# Patient Record
Sex: Male | Born: 1955 | Race: Black or African American | Hispanic: No | Marital: Single
Health system: Southern US, Community
[De-identification: ages and names within clinical notes are randomized; demographics above are authoritative.]

## PROBLEM LIST (undated history)

## (undated) DIAGNOSIS — F209 Schizophrenia, unspecified: Secondary | ICD-10-CM

## (undated) DIAGNOSIS — R519 Headache, unspecified: Secondary | ICD-10-CM

## (undated) DIAGNOSIS — F99 Mental disorder, not otherwise specified: Secondary | ICD-10-CM

## (undated) DIAGNOSIS — R51 Headache: Secondary | ICD-10-CM

## (undated) DIAGNOSIS — M545 Low back pain, unspecified: Secondary | ICD-10-CM

## (undated) DIAGNOSIS — N189 Chronic kidney disease, unspecified: Secondary | ICD-10-CM

## (undated) DIAGNOSIS — I63542 Cerebral infarction due to unspecified occlusion or stenosis of left cerebellar artery: Secondary | ICD-10-CM

## (undated) DIAGNOSIS — I10 Essential (primary) hypertension: Secondary | ICD-10-CM

## (undated) HISTORY — DX: Cerebral infarction due to unspecified occlusion or stenosis of left cerebellar artery: I63.542

---

## 1998-09-14 ENCOUNTER — Emergency Department (HOSPITAL_COMMUNITY): Admission: EM | Admit: 1998-09-14 | Discharge: 1998-09-14 | Payer: Self-pay | Admitting: *Deleted

## 2000-08-01 ENCOUNTER — Emergency Department (HOSPITAL_COMMUNITY): Admission: EM | Admit: 2000-08-01 | Discharge: 2000-08-01 | Payer: Self-pay

## 2001-11-14 ENCOUNTER — Emergency Department (HOSPITAL_COMMUNITY): Admission: EM | Admit: 2001-11-14 | Discharge: 2001-11-14 | Payer: Self-pay | Admitting: Emergency Medicine

## 2001-11-16 ENCOUNTER — Emergency Department (HOSPITAL_COMMUNITY): Admission: EM | Admit: 2001-11-16 | Discharge: 2001-11-16 | Payer: Self-pay | Admitting: Emergency Medicine

## 2002-09-16 ENCOUNTER — Emergency Department (HOSPITAL_COMMUNITY): Admission: EM | Admit: 2002-09-16 | Discharge: 2002-09-16 | Payer: Self-pay | Admitting: Emergency Medicine

## 2002-09-16 ENCOUNTER — Encounter: Payer: Self-pay | Admitting: Emergency Medicine

## 2003-08-26 ENCOUNTER — Emergency Department (HOSPITAL_COMMUNITY): Admission: AD | Admit: 2003-08-26 | Discharge: 2003-08-26 | Payer: Self-pay | Admitting: Family Medicine

## 2003-10-20 ENCOUNTER — Emergency Department (HOSPITAL_COMMUNITY): Admission: EM | Admit: 2003-10-20 | Discharge: 2003-10-20 | Payer: Self-pay | Admitting: Emergency Medicine

## 2003-11-28 ENCOUNTER — Emergency Department (HOSPITAL_COMMUNITY): Admission: EM | Admit: 2003-11-28 | Discharge: 2003-11-28 | Payer: Self-pay | Admitting: Emergency Medicine

## 2006-08-23 ENCOUNTER — Emergency Department (HOSPITAL_COMMUNITY): Admission: EM | Admit: 2006-08-23 | Discharge: 2006-08-23 | Payer: Self-pay | Admitting: Emergency Medicine

## 2006-11-21 ENCOUNTER — Emergency Department (HOSPITAL_COMMUNITY): Admission: EM | Admit: 2006-11-21 | Discharge: 2006-11-21 | Payer: Self-pay | Admitting: Emergency Medicine

## 2007-01-16 ENCOUNTER — Emergency Department (HOSPITAL_COMMUNITY): Admission: EM | Admit: 2007-01-16 | Discharge: 2007-01-16 | Payer: Self-pay | Admitting: Emergency Medicine

## 2007-07-06 ENCOUNTER — Emergency Department (HOSPITAL_COMMUNITY): Admission: EM | Admit: 2007-07-06 | Discharge: 2007-07-06 | Payer: Self-pay | Admitting: Emergency Medicine

## 2007-07-12 ENCOUNTER — Emergency Department (HOSPITAL_COMMUNITY): Admission: EM | Admit: 2007-07-12 | Discharge: 2007-07-12 | Payer: Self-pay | Admitting: Emergency Medicine

## 2007-11-24 ENCOUNTER — Emergency Department (HOSPITAL_COMMUNITY): Admission: EM | Admit: 2007-11-24 | Discharge: 2007-11-24 | Payer: Self-pay | Admitting: Family Medicine

## 2007-12-08 ENCOUNTER — Inpatient Hospital Stay (HOSPITAL_COMMUNITY): Admission: EM | Admit: 2007-12-08 | Discharge: 2007-12-12 | Payer: Self-pay | Admitting: Emergency Medicine

## 2007-12-18 ENCOUNTER — Ambulatory Visit: Payer: Self-pay | Admitting: Gastroenterology

## 2008-02-25 ENCOUNTER — Emergency Department (HOSPITAL_COMMUNITY): Admission: EM | Admit: 2008-02-25 | Discharge: 2008-02-25 | Payer: Self-pay | Admitting: Emergency Medicine

## 2008-04-28 ENCOUNTER — Emergency Department (HOSPITAL_COMMUNITY): Admission: EM | Admit: 2008-04-28 | Discharge: 2008-04-28 | Payer: Self-pay | Admitting: Emergency Medicine

## 2008-04-30 ENCOUNTER — Emergency Department (HOSPITAL_COMMUNITY): Admission: EM | Admit: 2008-04-30 | Discharge: 2008-04-30 | Payer: Self-pay | Admitting: Family Medicine

## 2008-05-02 ENCOUNTER — Emergency Department (HOSPITAL_COMMUNITY): Admission: EM | Admit: 2008-05-02 | Discharge: 2008-05-02 | Payer: Self-pay | Admitting: Emergency Medicine

## 2008-06-28 ENCOUNTER — Ambulatory Visit: Payer: Self-pay | Admitting: Family Medicine

## 2008-06-28 LAB — CONVERTED CEMR LAB
Albumin: 4.7 g/dL (ref 3.5–5.2)
BUN: 15 mg/dL (ref 6–23)
Basophils Absolute: 0 10*3/uL (ref 0.0–0.1)
CO2: 23 meq/L (ref 19–32)
Cholesterol: 174 mg/dL (ref 0–200)
Creatinine, Ser: 1.63 mg/dL — ABNORMAL HIGH (ref 0.40–1.50)
Glucose, Bld: 79 mg/dL (ref 70–99)
HDL: 63 mg/dL (ref >39)
Hemoglobin: 14.3 g/dL (ref 13.0–17.0)
LDL Cholesterol: 100 mg/dL — ABNORMAL HIGH (ref 0–99)
Lymphs Abs: 2.5 10*3/uL (ref 0.7–4.0)
MCHC: 33.5 g/dL (ref 30.0–36.0)
MCV: 83.7 fL (ref 78.0–100.0)
Monocytes Relative: 6 % (ref 3–12)
Neutro Abs: 3.7 10*3/uL (ref 1.7–7.7)
Platelets: 238 10*3/uL (ref 150–400)
Potassium: 3.5 meq/L (ref 3.5–5.3)
RBC: 5.1 M/uL (ref 4.22–5.81)
RDW: 14.8 % (ref 11.5–15.5)
TSH: 1.292 microintl units/mL (ref 0.350–4.50)
Testosterone: 695.53 ng/dL (ref 350–890)
Total Bilirubin: 0.7 mg/dL (ref 0.3–1.2)
Total Protein: 7.6 g/dL (ref 6.0–8.3)
VLDL: 11 mg/dL (ref 0–40)
WBC: 6.6 10*3/uL (ref 4.0–10.5)

## 2008-09-12 ENCOUNTER — Ambulatory Visit: Payer: Self-pay | Admitting: Family Medicine

## 2009-02-28 ENCOUNTER — Emergency Department (HOSPITAL_COMMUNITY): Admission: EM | Admit: 2009-02-28 | Discharge: 2009-03-02 | Payer: Self-pay | Admitting: Emergency Medicine

## 2010-02-21 ENCOUNTER — Emergency Department (HOSPITAL_COMMUNITY): Admission: EM | Admit: 2010-02-21 | Discharge: 2010-02-21 | Payer: Self-pay | Admitting: Emergency Medicine

## 2010-05-16 ENCOUNTER — Inpatient Hospital Stay (HOSPITAL_COMMUNITY): Admission: EM | Admit: 2010-05-16 | Discharge: 2010-05-17 | Payer: Self-pay | Admitting: Emergency Medicine

## 2010-05-17 ENCOUNTER — Ambulatory Visit: Payer: Self-pay | Admitting: Psychiatry

## 2010-05-17 ENCOUNTER — Inpatient Hospital Stay (HOSPITAL_COMMUNITY): Admission: RE | Admit: 2010-05-17 | Discharge: 2010-05-20 | Payer: Self-pay | Admitting: Psychiatry

## 2010-06-25 ENCOUNTER — Ambulatory Visit: Payer: Self-pay | Admitting: Family Medicine

## 2010-12-06 ENCOUNTER — Encounter: Payer: Self-pay | Admitting: Psychiatry

## 2011-01-27 ENCOUNTER — Emergency Department (HOSPITAL_COMMUNITY)
Admission: EM | Admit: 2011-01-27 | Discharge: 2011-01-28 | Disposition: A | Discharge status: Home / Self Care | Payer: Self-pay | Attending: Emergency Medicine | Admitting: Emergency Medicine

## 2011-01-27 DIAGNOSIS — K2970 Gastritis, unspecified, without bleeding: Secondary | ICD-10-CM | POA: Insufficient documentation

## 2011-01-27 DIAGNOSIS — I10 Essential (primary) hypertension: Secondary | ICD-10-CM | POA: Insufficient documentation

## 2011-01-27 DIAGNOSIS — R630 Anorexia: Secondary | ICD-10-CM | POA: Insufficient documentation

## 2011-01-27 DIAGNOSIS — R209 Unspecified disturbances of skin sensation: Secondary | ICD-10-CM | POA: Insufficient documentation

## 2011-01-27 DIAGNOSIS — R110 Nausea: Secondary | ICD-10-CM | POA: Insufficient documentation

## 2011-01-27 DIAGNOSIS — F3289 Other specified depressive episodes: Secondary | ICD-10-CM | POA: Insufficient documentation

## 2011-01-27 DIAGNOSIS — M542 Cervicalgia: Secondary | ICD-10-CM | POA: Insufficient documentation

## 2011-01-27 DIAGNOSIS — R1013 Epigastric pain: Secondary | ICD-10-CM | POA: Insufficient documentation

## 2011-01-27 DIAGNOSIS — F329 Major depressive disorder, single episode, unspecified: Secondary | ICD-10-CM | POA: Insufficient documentation

## 2011-01-28 ENCOUNTER — Emergency Department (HOSPITAL_COMMUNITY): Payer: Self-pay

## 2011-01-28 LAB — CBC
HCT: 36.8 % — ABNORMAL LOW (ref 39.0–52.0)
Hemoglobin: 12.3 g/dL — ABNORMAL LOW (ref 13.0–17.0)
RBC: 4.51 MIL/uL (ref 4.22–5.81)
WBC: 6 10*3/uL (ref 4.0–10.5)

## 2011-01-28 LAB — COMPREHENSIVE METABOLIC PANEL
Albumin: 3.9 g/dL (ref 3.5–5.2)
CO2: 27 mEq/L (ref 19–32)
Chloride: 104 mEq/L (ref 96–112)
GFR calc Af Amer: 51 mL/min — ABNORMAL LOW (ref >60)
Glucose, Bld: 101 mg/dL — ABNORMAL HIGH (ref 70–99)
Potassium: 3.8 mEq/L (ref 3.5–5.1)
Sodium: 136 mEq/L (ref 135–145)
Total Bilirubin: 0.7 mg/dL (ref 0.3–1.2)
Total Protein: 7.3 g/dL (ref 6.0–8.3)

## 2011-01-28 LAB — LIPASE, BLOOD: Lipase: 26 U/L (ref 11–59)

## 2011-01-28 LAB — DIFFERENTIAL
Basophils Relative: 1 % (ref 0–1)
Eosinophils Absolute: 0.1 10*3/uL (ref 0.0–0.7)
Lymphs Abs: 2.3 10*3/uL (ref 0.7–4.0)
Monocytes Absolute: 0.5 10*3/uL (ref 0.1–1.0)
Monocytes Relative: 9 % (ref 3–12)

## 2011-01-31 LAB — BASIC METABOLIC PANEL
BUN: 13 mg/dL (ref 6–23)
BUN: 15 mg/dL (ref 6–23)
BUN: 19 mg/dL (ref 6–23)
CO2: 26 mEq/L (ref 19–32)
Calcium: 9.3 mg/dL (ref 8.4–10.5)
Calcium: 9.8 mg/dL (ref 8.4–10.5)
Chloride: 108 mEq/L (ref 96–112)
Creatinine, Ser: 1.79 mg/dL — ABNORMAL HIGH (ref 0.4–1.5)
Creatinine, Ser: 1.76 mg/dL — ABNORMAL HIGH (ref 0.4–1.5)
Creatinine, Ser: 2.07 mg/dL — ABNORMAL HIGH (ref 0.4–1.5)
GFR calc Af Amer: 41 mL/min — ABNORMAL LOW (ref >60)
GFR calc Af Amer: 49 mL/min — ABNORMAL LOW (ref >60)
GFR calc non Af Amer: 34 mL/min — ABNORMAL LOW (ref >60)

## 2011-01-31 LAB — COMPREHENSIVE METABOLIC PANEL
ALT: 10 U/L (ref 0–53)
ALT: 11 U/L (ref 0–53)
Alkaline Phosphatase: 37 U/L — ABNORMAL LOW (ref 39–117)
Alkaline Phosphatase: 42 U/L (ref 39–117)
BUN: 15 mg/dL (ref 6–23)
CO2: 25 mEq/L (ref 19–32)
CO2: 26 mEq/L (ref 19–32)
Calcium: 9.1 mg/dL (ref 8.4–10.5)
GFR calc non Af Amer: 35 mL/min — ABNORMAL LOW (ref >60)
GFR calc non Af Amer: 41 mL/min — ABNORMAL LOW (ref >60)
Glucose, Bld: 114 mg/dL — ABNORMAL HIGH (ref 70–99)
Glucose, Bld: 142 mg/dL — ABNORMAL HIGH (ref 70–99)
Potassium: 3.5 mEq/L (ref 3.5–5.1)
Sodium: 135 mEq/L (ref 135–145)
Total Bilirubin: 0.8 mg/dL (ref 0.3–1.2)
Total Protein: 6.1 g/dL (ref 6.0–8.3)

## 2011-01-31 LAB — URINALYSIS, ROUTINE W REFLEX MICROSCOPIC
Ketones, ur: NEGATIVE mg/dL
Protein, ur: NEGATIVE mg/dL
Urobilinogen, UA: 0.2 mg/dL (ref 0.0–1.0)

## 2011-01-31 LAB — CBC
HCT: 34.1 % — ABNORMAL LOW (ref 39.0–52.0)
HCT: 35.9 % — ABNORMAL LOW (ref 39.0–52.0)
HCT: 39.3 % (ref 39.0–52.0)
Hemoglobin: 11.4 g/dL — ABNORMAL LOW (ref 13.0–17.0)
Hemoglobin: 12.2 g/dL — ABNORMAL LOW (ref 13.0–17.0)
MCH: 28.8 pg (ref 26.0–34.0)
MCHC: 33.9 g/dL (ref 30.0–36.0)
MCV: 86.6 fL (ref 78.0–100.0)
MCV: 87.6 fL (ref 78.0–100.0)
Platelets: 253 10*3/uL (ref 150–400)
RBC: 4.48 MIL/uL (ref 4.22–5.81)
RDW: 14.5 % (ref 11.5–15.5)
RDW: 14.7 % (ref 11.5–15.5)
WBC: 6.9 10*3/uL (ref 4.0–10.5)

## 2011-01-31 LAB — DIFFERENTIAL
Basophils Absolute: 0 10*3/uL (ref 0.0–0.1)
Basophils Relative: 0 % (ref 0–1)
Eosinophils Absolute: 0 10*3/uL (ref 0.0–0.7)
Neutrophils Relative %: 65 % (ref 43–77)

## 2011-01-31 LAB — BLOOD GAS, ARTERIAL
Acid-base deficit: 0.9 mmol/L (ref 0.0–2.0)
Drawn by: 324021
O2 Content: 2 L/min
O2 Saturation: 97.3 %

## 2011-01-31 LAB — TROPONIN I: Troponin I: 0.02 ng/mL (ref 0.00–0.06)

## 2011-01-31 LAB — ETHANOL: Alcohol, Ethyl (B): 6 mg/dL (ref 0–10)

## 2011-01-31 LAB — RAPID URINE DRUG SCREEN, HOSP PERFORMED
Barbiturates: NOT DETECTED
Benzodiazepines: NOT DETECTED
Cocaine: POSITIVE — AB
Opiates: NOT DETECTED

## 2011-01-31 LAB — SALICYLATE LEVEL: Salicylate Lvl: <4 mg/dL (ref 2.8–20.0)

## 2011-02-03 LAB — DIFFERENTIAL
Eosinophils Relative: 1 % (ref 0–5)
Lymphocytes Relative: 22 % (ref 12–46)
Monocytes Absolute: 0.8 10*3/uL (ref 0.1–1.0)
Monocytes Relative: 7 % (ref 3–12)
Neutro Abs: 7.9 10*3/uL — ABNORMAL HIGH (ref 1.7–7.7)

## 2011-02-03 LAB — BASIC METABOLIC PANEL
CO2: 28 mEq/L (ref 19–32)
Calcium: 9.1 mg/dL (ref 8.4–10.5)
Creatinine, Ser: 1.92 mg/dL — ABNORMAL HIGH (ref 0.4–1.5)
GFR calc Af Amer: 45 mL/min — ABNORMAL LOW (ref >60)
GFR calc non Af Amer: 37 mL/min — ABNORMAL LOW (ref >60)
Glucose, Bld: 100 mg/dL — ABNORMAL HIGH (ref 70–99)
Sodium: 136 mEq/L (ref 135–145)

## 2011-02-03 LAB — CBC
Hemoglobin: 13.1 g/dL (ref 13.0–17.0)
MCHC: 32.7 g/dL (ref 30.0–36.0)
RBC: 4.59 MIL/uL (ref 4.22–5.81)
RDW: 14.6 % (ref 11.5–15.5)

## 2011-03-30 NOTE — H&P (Signed)
NAME:  Darryl Parsons, Darryl Parsons              ACCOUNT NO.:  [id number]   MEDICAL RECORD NO.:  [id number]          PATIENT TYPE:  INP   LOCATION:  1823                         FACILITY:  [facility]   PHYSICIAN:  Mobolaji B. Bakare, M.D.DATE OF BIRTH:  June 23, 1956   DATE OF ADMISSION:  12/08/2007  DATE OF DISCHARGE:                              HISTORY & PHYSICAL   PRIORITY ADMISSION HISTORY AND PHYSICAL    </PRIMARY CARE PHYSICIAN/  UNASSIGNED   ADMISSION DIAGNOSES:  1. Hematochezia with fairly stable hemoglobin and hematocrit since      March 2008.  The patient will be admitted to a telemetry floor,      intravenous fluid normal saline 100 ml x1 liter and then 60 ml per      hour.  We will discontinue Indocin.  We will check hemoglobin and      hematocrit q.8h, type and screen two units of packed red blood      cells, will place on a clear liquid diet and obtain a      Gastroenterology consult in the a.m.  He will need further      evaluation with colonoscopy.  There was no para-anal discharging      sinus noted.  2. Renal insufficiency.  This may be related to the Vasotec.  We will      discontinue the Vasotec and monitor BMET.  We will check a      fractional excretion of sodium.  3. Hypertension.  Blood pressure is fairly normal.  We will hold all      Vasotec for now and use Hydralazine p.r.n. for systolic blood      pressure greater than 180.  Will consider starting beta blocker      when stability of his blood pressure is assured.  4. Tobacco abuse.  We will offer tobacco cessation counseling.  We      will place a nicotine patch 21 mg daily.   PRIMARY CARE PHYSICIAN:  Unassigned.   CHIEF COMPLAINT:  Rectal bleeding.   HISTORY OF PRESENTING COMPLAINT:  Darryl Parsons is a 55 year old, African-  American male presenting with recurrent rectal bleeding since 2007.  He  stated that he is getting worse in the last couple of weeks such that  his underwear gets soaked through to his jeans (this  was actually noted  on patient's clothing in the emergency room).  There is no associated  abdominal pain.  His stool is aware normal color and not hard.  He does  not have constipation.  He sometimes notices blood after moving his  bowels though there is no pain on defecation.   The patient states he has lost about five pounds of weight in the past  couple of weeks unintentional and he has been eating well.  He has also  been feeling somewhat dizzy but has not passed out.   Initial evaluation in the emergency room revealed normal coags,  hemoglobin and hematocrit were 12.5 and 37.3 respectively.  Liver tests  have been fairly stable since March 2008 when hemoglobin was 12.7 and  hematocrit of 38.  The patient denies alcohol use.  He has been using  Endocet for sciatica; however, there is not a complaint of abdominal  pain.   REVIEW OF SYSTEMS:  He denies fever, chills, shortness of breath, cough,  headaches.  He has been having intermittent cramps in his left lower  extremity.  No dysuria or urgency.   PAST MEDICAL HISTORY:  Hypertension.  This was diagnosed while he was  spending time in jail.  Since he left jail in 2006, he has been using  his medication erratically.   CURRENT MEDICATIONS:  Indocin, Vasotec, Viagra.   ALLERGIES:  No known drug allergies.   FAMILY HISTORY:  No family history of colon cancer.  Mother is alive.  She has hypertension and arthritis.  He has no knowledge of his father's  health status.   SOCIAL HISTORY:  The patient is unemployed.  He is a Immunologist.  He smokes cigarettes one pack per day.  He states  that he seldom drinks alcohol, maybe a pint of vodka liquor twice a  year.  He has used crack cocaine in the past.   INITIAL VITAL SIGNS:  Temperature 98.4, blood pressure 140/95, pulse of  62, respiratory rate 18, O2 SAT 97% on room air.   PHYSICAL EXAMINATION:  GENERAL:  The patient is awake, alert, oriented  in time,  place, and person.  HEENT:  Normocephalic, atraumatic head, pupils equal, round, and  reactive to light.  NECK:  No elevated JVD, no carotid bruit.  LUNGS:  Clear clinically to auscultation.  CVS:  S1 S2 regular.  ABDOMEN:  Not distended, soft and nontender, bowel sounds present.  RECTAL EXAMINATION:  Visual palpable organomegaly.  He appears to have a  patulous  anal verge.  There is no perianal cyanosis noted.  There is no  fine bleeding noted through the anus.  He had a digital rectal done by  ER physician, which was positive for stool hemoccult.  EXTREMITIES:  No pedal edema or calf tenderness.  Dorsalis pedis pulses  2+ bilaterally.  CNS:  No focal neurological deficit.   INITIAL LABORATORY DATA:  White cell 8.4, hemoglobin 12.5, hematocrit  37.3, platelets 215, normal differential.  Stool hemoccult positive.  PT-  INR 15.6/1.0, PTT 33.  Sodium 138, potassium 4.7, chloride 108, bicarb  23, glucose 81, BUN 18, creatinine 1.87.  Bilirubin 1.3, alkaline-  phosphatase 45, AST 27, ALT 14, total protein 6.7, albumin 3.8, calcium  9.3.  Urinalysis showed a specific gravity of 1.026, negative for  nitrites and leukocyte-esterase.   ASSESSMENT AND PLAN:  Darryl Parsons is a 55 year old, African-American male  presenting with rectal bleeding, which soaks through his underwear and  clothing and I noticed this on his underwear and jeans in the emergency  room.  It does not appear entirely frank blood.  He will be admitted for  further evaluation.      Mobolaji B. Maia Petties, M.D.  Electronically Signed     MBB/MEDQ  D:  12/08/2007  T:  12/08/2007  Job:  [id number]

## 2011-04-02 NOTE — Discharge Summary (Signed)
NAMEMAYCOL, SHOTTS              ACCOUNT NO.:  [id number]   MEDICAL RECORD NO.:  [id number]          PATIENT TYPE:  INP   LOCATION:  6706                         FACILITY:  [facility]   PHYSICIAN:  Mobolaji B. Bakare, M.D.DATE OF BIRTH:  Jul 10, 1956   DATE OF ADMISSION:  12/08/2007  DATE OF DISCHARGE:  12/12/2007                               DISCHARGE SUMMARY   FINAL DIAGNOSES:  1. Hematochezia secondary to internal hemorrhoids.  2. Hypertension.  3. Tobacco abuse.  4. Asymptomatic sinus bradycardia.  5. Renal insufficiency likely chronic.  6. Cocaine abuse.  76. Homeless, lives in a shelter.   PROCEDURES:  Colonoscopy done on December 11, 2007, by Dr. Fuller Plan.  Findings, large internal hemorrhoids.   HOSPITAL CONSULTATIONS:  Gastroenterology consult provided by Dr.  Norberto Sorenson T. Fuller Plan.   BRIEF HISTORY:  Please refer to the admission H and P dictated by Dr.  Maia Petties.  Mr. Mercedes is a 55 year old African American male who is  homeless and lives in a shelter.  He presented with hematochezia which  was painless, acute renal insufficiency.  He has history of  hypertension, tobacco abuse and cocaine abuse.  He has had several  emergency room visits for rectal bleed in the past prior to this  hospitalization.  His coags were normal.  Hemoglobin and hematocrit have  been relatively stable since March 2008.  The patient was admitted for  further evaluation.   HOSPITAL COURSE:  Painless hematochezia.  The patient's hemoglobin and  hematocrit were monitored during the course of hospitalization.  These  remained fairly stable.  GI consult was obtained.  He was prepped for a  colonoscopy.  Colonoscopy revealed large internal hemorrhoids.  The  patient was prescribed Anusol suppository p.r.n. and advised on  conservative management including avoiding constipation.  During the  course of hospitalization he was noted on telemetry to have sinus  bradycardia.  This was asymptomatic.  Heart rate was in  the mid to high  50s.   Hypertension.  The patient's blood pressure was fairly controlled during  the course of hospitalization.  Adjustment was made to his medication.  He was discharged home on hydralazine.   Upon admission he was noted to have renal insufficiency with normal BUN  of 18 and creatinine of 1.87.  The patient had apparently been on  Vasotec prior to hospitalization.  It is unclear if he was actually  taking his medications regularly.  He could not tell who his primary  care doctor was.  The Vasotec was discontinued.  ACE inhibitor and  diuretics were totally avoided because the patient's compliance with  followup is unreliable.  He was started on hydralazine.  Blood pressure  was improving at the time of discharge, blood pressure was 121/97.   Renal  insufficiency.  This appears to be chronic.  The patient has been  on Indocin and Vasotec prior to hospitalization and his urine drug  screen was also positive for cocaine.  He should be mentioned that he  had uncontrolled hypertension at the time of admission.  At the time of  discharge BUN was 14 and  creatinine of 1.48.  Urine sodium was 88.  Unfortunately, a urine creatinine was not performed.   DISCHARGE MEDICATIONS:  1. Colace 100 mg daily at bedtime.  2. Anusol suppository one p.r. p.r.n.  3. Nicotine patch 21 mg daily.  4. Nu-Iron 150 mg daily.  5. Hydralazine 10 mg 4x a day.   DISCHARGE LABORATORY DATA:  Fasting lipid profile - total cholesterol  168, HDL 51, LDL 93, ferritin 58, folic acid 8.8, vitamin B12 428, iron  87, TIBC 270, percent of saturation 32.   DISPOSITION:  The patient was discharged back to the shelter.  He was  offered to follow up at [facility] but he declined.  He stated that he  would go to [facility].  Followup BMET in 1 week.      Mobolaji B. Maia Petties, M.D.  Electronically Signed     MBB/MEDQ  D:  02/03/2008  T:  02/03/2008  Job:  [id number]

## 2011-04-03 ENCOUNTER — Emergency Department (HOSPITAL_COMMUNITY)
Admission: EM | Admit: 2011-04-03 | Discharge: 2011-04-03 | Disposition: A | Discharge status: Home / Self Care | Payer: Self-pay | Attending: Emergency Medicine | Admitting: Emergency Medicine

## 2011-04-03 DIAGNOSIS — F3289 Other specified depressive episodes: Secondary | ICD-10-CM | POA: Insufficient documentation

## 2011-04-03 DIAGNOSIS — M542 Cervicalgia: Secondary | ICD-10-CM | POA: Insufficient documentation

## 2011-04-03 DIAGNOSIS — R599 Enlarged lymph nodes, unspecified: Secondary | ICD-10-CM | POA: Insufficient documentation

## 2011-04-03 DIAGNOSIS — M79609 Pain in unspecified limb: Secondary | ICD-10-CM | POA: Insufficient documentation

## 2011-04-03 DIAGNOSIS — F329 Major depressive disorder, single episode, unspecified: Secondary | ICD-10-CM | POA: Insufficient documentation

## 2011-04-03 DIAGNOSIS — I10 Essential (primary) hypertension: Secondary | ICD-10-CM | POA: Insufficient documentation

## 2011-04-03 DIAGNOSIS — H9209 Otalgia, unspecified ear: Secondary | ICD-10-CM | POA: Insufficient documentation

## 2011-05-27 ENCOUNTER — Emergency Department (HOSPITAL_COMMUNITY)
Admission: EM | Admit: 2011-05-27 | Discharge: 2011-05-28 | Disposition: A | Discharge status: Home / Self Care | Payer: Self-pay | Attending: Emergency Medicine | Admitting: Emergency Medicine

## 2011-05-27 DIAGNOSIS — N289 Disorder of kidney and ureter, unspecified: Secondary | ICD-10-CM | POA: Insufficient documentation

## 2011-05-27 DIAGNOSIS — T50902A Poisoning by unspecified drugs, medicaments and biological substances, intentional self-harm, initial encounter: Secondary | ICD-10-CM | POA: Insufficient documentation

## 2011-05-27 DIAGNOSIS — R5383 Other fatigue: Secondary | ICD-10-CM | POA: Insufficient documentation

## 2011-05-27 DIAGNOSIS — I10 Essential (primary) hypertension: Secondary | ICD-10-CM | POA: Insufficient documentation

## 2011-05-27 DIAGNOSIS — T50901A Poisoning by unspecified drugs, medicaments and biological substances, accidental (unintentional), initial encounter: Secondary | ICD-10-CM | POA: Insufficient documentation

## 2011-05-27 DIAGNOSIS — F1410 Cocaine abuse, uncomplicated: Secondary | ICD-10-CM | POA: Insufficient documentation

## 2011-05-27 DIAGNOSIS — R5381 Other malaise: Secondary | ICD-10-CM | POA: Insufficient documentation

## 2011-05-27 DIAGNOSIS — R45851 Suicidal ideations: Secondary | ICD-10-CM | POA: Insufficient documentation

## 2011-05-27 LAB — CBC
MCH: 27.1 pg (ref 26.0–34.0)
Platelets: 219 10*3/uL (ref 150–400)
RBC: 4.51 MIL/uL (ref 4.22–5.81)

## 2011-05-27 LAB — URINE MICROSCOPIC-ADD ON

## 2011-05-27 LAB — RAPID URINE DRUG SCREEN, HOSP PERFORMED
Barbiturates: NOT DETECTED
Opiates: NOT DETECTED
Tetrahydrocannabinol: NOT DETECTED

## 2011-05-27 LAB — COMPREHENSIVE METABOLIC PANEL
ALT: 9 U/L (ref 0–53)
Albumin: 3.9 g/dL (ref 3.5–5.2)
Alkaline Phosphatase: 53 U/L (ref 39–117)
Chloride: 102 mEq/L (ref 96–112)
Glucose, Bld: 83 mg/dL (ref 70–99)
Potassium: 3.3 mEq/L — ABNORMAL LOW (ref 3.5–5.1)
Sodium: 134 mEq/L — ABNORMAL LOW (ref 135–145)
Total Protein: 7.7 g/dL (ref 6.0–8.3)

## 2011-05-27 LAB — URINALYSIS, ROUTINE W REFLEX MICROSCOPIC
Bilirubin Urine: NEGATIVE
Glucose, UA: NEGATIVE mg/dL
Ketones, ur: NEGATIVE mg/dL
Nitrite: NEGATIVE
Specific Gravity, Urine: 1.012 (ref 1.005–1.030)
pH: 5.5 (ref 5.0–8.0)

## 2011-05-27 LAB — DIFFERENTIAL
Basophils Absolute: 0 10*3/uL (ref 0.0–0.1)
Basophils Relative: 0 % (ref 0–1)
Eosinophils Absolute: 0.1 10*3/uL (ref 0.0–0.7)
Monocytes Relative: 8 % (ref 3–12)
Neutro Abs: 5.1 10*3/uL (ref 1.7–7.7)
Neutrophils Relative %: 64 % (ref 43–77)

## 2011-05-27 LAB — PROTIME-INR
INR: 1.09 (ref 0.00–1.49)
Prothrombin Time: 14.3 seconds (ref 11.6–15.2)

## 2011-05-27 LAB — SALICYLATE LEVEL: Salicylate Lvl: <2 mg/dL — ABNORMAL LOW (ref 2.8–20.0)

## 2011-05-27 LAB — ACETAMINOPHEN LEVEL: Acetaminophen (Tylenol), Serum: <15 ug/mL (ref 10–30)

## 2011-05-28 LAB — URINE CULTURE: Culture  Setup Time: 201207121252

## 2011-08-05 LAB — COMPREHENSIVE METABOLIC PANEL
Albumin: 3.8
Alkaline Phosphatase: 40
BUN: 14
BUN: 18
CO2: 23
Chloride: 108
Creatinine, Ser: 1.48
Creatinine, Ser: 1.87 — ABNORMAL HIGH
GFR calc non Af Amer: 50 — ABNORMAL LOW
Glucose, Bld: 120 — ABNORMAL HIGH
Potassium: 4.1
Total Bilirubin: 1
Total Bilirubin: 1.2
Total Protein: 6.7

## 2011-08-05 LAB — FERRITIN: Ferritin: 58 (ref 22–322)

## 2011-08-05 LAB — URINE DRUGS OF ABUSE SCREEN W ALC, ROUTINE (REF LAB)
Barbiturate Quant, Ur: NEGATIVE
Benzodiazepines.: NEGATIVE
Cocaine Metabolites: POSITIVE — AB
Phencyclidine (PCP): NEGATIVE
Propoxyphene: NEGATIVE

## 2011-08-05 LAB — APTT: aPTT: 33

## 2011-08-05 LAB — IRON AND TIBC
Iron: 87
TIBC: 270
UIBC: 183

## 2011-08-05 LAB — CROSSMATCH

## 2011-08-05 LAB — BASIC METABOLIC PANEL
BUN: 9
Calcium: 8.7
GFR calc non Af Amer: 47 — ABNORMAL LOW
Glucose, Bld: 87
Potassium: 4
Sodium: 134 — ABNORMAL LOW

## 2011-08-05 LAB — CBC
HCT: 37.3 — ABNORMAL LOW
HCT: 37.7 — ABNORMAL LOW
Hemoglobin: 12.5 — ABNORMAL LOW
Hemoglobin: 13.4
MCHC: 33.5
MCV: 84.9
MCV: 86.1
Platelets: 216
RBC: 4.38
RBC: 4.78
RDW: 14.3
WBC: 5.4
WBC: 6.4

## 2011-08-05 LAB — LIPID PANEL
Cholesterol: 168
HDL: 51
Total CHOL/HDL Ratio: 3.3

## 2011-08-05 LAB — URINALYSIS, ROUTINE W REFLEX MICROSCOPIC
Bilirubin Urine: NEGATIVE
Hgb urine dipstick: NEGATIVE
Ketones, ur: 15 — AB
Ketones, ur: NEGATIVE
Nitrite: NEGATIVE
Protein, ur: NEGATIVE
Specific Gravity, Urine: 1.022
Urobilinogen, UA: 0.2
Urobilinogen, UA: 1
pH: 5.5

## 2011-08-05 LAB — DIFFERENTIAL
Basophils Absolute: 0
Lymphocytes Relative: 30
Monocytes Absolute: 0.9
Neutro Abs: 4.9

## 2011-08-05 LAB — ABO/RH: ABO/RH(D): O POS

## 2011-08-05 LAB — HEMOGLOBIN AND HEMATOCRIT, BLOOD
Hemoglobin: 12.6 — ABNORMAL LOW
Hemoglobin: 13.1

## 2011-08-05 LAB — PHOSPHORUS: Phosphorus: 2.3

## 2011-08-05 LAB — SODIUM, URINE, RANDOM: Sodium, Ur: 88

## 2011-08-05 LAB — OCCULT BLOOD X 1 CARD TO LAB, STOOL: Fecal Occult Bld: NEGATIVE

## 2011-08-05 LAB — FOLATE: Folate: 8.8

## 2011-08-12 LAB — POCT I-STAT, CHEM 8
BUN: 16
Chloride: 109
Creatinine, Ser: 1.1
Glucose, Bld: 139 — ABNORMAL HIGH
Potassium: 3.9
Sodium: 139

## 2011-08-12 LAB — DIFFERENTIAL
Basophils Absolute: 0
Eosinophils Relative: 1
Lymphocytes Relative: 26
Lymphs Abs: 1.9
Monocytes Absolute: 1.4 — ABNORMAL HIGH
Monocytes Relative: 19 — ABNORMAL HIGH
Neutro Abs: 4

## 2011-08-12 LAB — CBC
HCT: 37.5 — ABNORMAL LOW
Hemoglobin: 12.5 — ABNORMAL LOW
RBC: 4.42
RDW: 14.2
WBC: 7.5

## 2011-08-12 LAB — INFLUENZA A AND B ANTIGEN (CONVERTED LAB)
Inflenza A Ag: NEGATIVE
Influenza B Ag: NEGATIVE

## 2011-10-02 ENCOUNTER — Other Ambulatory Visit: Payer: Self-pay

## 2011-10-02 ENCOUNTER — Observation Stay (HOSPITAL_COMMUNITY)
Admission: EM | Admit: 2011-10-02 | Discharge: 2011-10-04 | Disposition: A | Discharge status: Home / Self Care | Payer: Self-pay | Attending: Internal Medicine | Admitting: Internal Medicine

## 2011-10-02 ENCOUNTER — Encounter: Payer: Self-pay | Admitting: *Deleted

## 2011-10-02 ENCOUNTER — Emergency Department (HOSPITAL_COMMUNITY): Payer: Self-pay

## 2011-10-02 DIAGNOSIS — F3131 Bipolar disorder, current episode depressed, mild: Secondary | ICD-10-CM | POA: Insufficient documentation

## 2011-10-02 DIAGNOSIS — N183 Chronic kidney disease, stage 3 unspecified: Secondary | ICD-10-CM | POA: Insufficient documentation

## 2011-10-02 DIAGNOSIS — R51 Headache: Principal | ICD-10-CM | POA: Insufficient documentation

## 2011-10-02 DIAGNOSIS — R52 Pain, unspecified: Secondary | ICD-10-CM

## 2011-10-02 DIAGNOSIS — I129 Hypertensive chronic kidney disease with stage 1 through stage 4 chronic kidney disease, or unspecified chronic kidney disease: Secondary | ICD-10-CM

## 2011-10-02 DIAGNOSIS — F17200 Nicotine dependence, unspecified, uncomplicated: Secondary | ICD-10-CM | POA: Insufficient documentation

## 2011-10-02 DIAGNOSIS — R519 Headache, unspecified: Secondary | ICD-10-CM

## 2011-10-02 DIAGNOSIS — R072 Precordial pain: Secondary | ICD-10-CM

## 2011-10-02 HISTORY — DX: Essential (primary) hypertension: I10

## 2011-10-02 HISTORY — DX: Low back pain, unspecified: M54.50

## 2011-10-02 HISTORY — DX: Chronic kidney disease, unspecified: N18.9

## 2011-10-02 HISTORY — DX: Mental disorder, not otherwise specified: F99

## 2011-10-02 HISTORY — DX: Low back pain: M54.5

## 2011-10-02 LAB — BASIC METABOLIC PANEL
BUN: 24 mg/dL — ABNORMAL HIGH (ref 6–23)
GFR calc non Af Amer: 31 mL/min — ABNORMAL LOW (ref >90)
Glucose, Bld: 98 mg/dL (ref 70–99)
Potassium: 3.7 mEq/L (ref 3.5–5.1)

## 2011-10-02 LAB — DIFFERENTIAL
Eosinophils Absolute: 0.1 10*3/uL (ref 0.0–0.7)
Eosinophils Relative: 1 % (ref 0–5)
Lymphs Abs: 2.6 10*3/uL (ref 0.7–4.0)
Monocytes Relative: 8 % (ref 3–12)
Neutrophils Relative %: 54 % (ref 43–77)

## 2011-10-02 LAB — CBC
Hemoglobin: 12.7 g/dL — ABNORMAL LOW (ref 13.0–17.0)
MCH: 28.1 pg (ref 26.0–34.0)
MCV: 83.2 fL (ref 78.0–100.0)
RBC: 4.52 MIL/uL (ref 4.22–5.81)

## 2011-10-02 MED ORDER — OXYCODONE HCL 5 MG PO TABS
5.0000 mg | ORAL_TABLET | ORAL | Status: DC | PRN
  Administered 2011-10-03 – 2011-10-04 (×3): 5 mg via ORAL
  Filled 2011-10-02 (×3): qty 1

## 2011-10-02 MED ORDER — ENALAPRIL-HYDROCHLOROTHIAZIDE 10-25 MG PO TABS: 1.0000 | ORAL_TABLET | ORAL | Status: DC

## 2011-10-02 MED ORDER — ENALAPRIL MALEATE 10 MG PO TABS
10.0000 mg | ORAL_TABLET | Freq: Every day | ORAL | Status: DC
  Administered 2011-10-03 – 2011-10-04 (×2): 10 mg via ORAL
  Filled 2011-10-02 (×3): qty 1

## 2011-10-02 MED ORDER — HYDROMORPHONE HCL PF 1 MG/ML IJ SOLN
1.0000 mg | Freq: Once | INTRAMUSCULAR | Status: AC
  Administered 2011-10-02: 1 mg via INTRAVENOUS
  Filled 2011-10-02: qty 1

## 2011-10-02 MED ORDER — VITAMIN D (ERGOCALCIFEROL) 1.25 MG (50000 UNIT) PO CAPS
50000.0000 [IU] | ORAL_CAPSULE | ORAL | Status: DC
  Administered 2011-10-02: 50000 [IU] via ORAL
  Filled 2011-10-02: qty 1

## 2011-10-02 MED ORDER — ACETAMINOPHEN 650 MG RE SUPP: 650.0000 mg | Freq: Four times a day (QID) | RECTAL | Status: DC | PRN

## 2011-10-02 MED ORDER — HYDROCHLOROTHIAZIDE 25 MG PO TABS
25.0000 mg | ORAL_TABLET | Freq: Every day | ORAL | Status: DC
  Administered 2011-10-03 – 2011-10-04 (×2): 25 mg via ORAL
  Filled 2011-10-02 (×3): qty 1

## 2011-10-02 MED ORDER — SODIUM CHLORIDE 0.9 % IJ SOLN: 3.0000 mL | INTRAMUSCULAR | Status: DC | PRN

## 2011-10-02 MED ORDER — SODIUM CHLORIDE 0.9 % IV SOLN: 250.0000 mL | INTRAVENOUS | Status: DC

## 2011-10-02 MED ORDER — HYDROMORPHONE HCL PF 1 MG/ML IJ SOLN: 1.0000 mg | INTRAMUSCULAR | Status: DC | PRN

## 2011-10-02 MED ORDER — ENOXAPARIN SODIUM 40 MG/0.4ML ~~LOC~~ SOLN
40.0000 mg | SUBCUTANEOUS | Status: DC
  Filled 2011-10-02 (×4): qty 0.4

## 2011-10-02 MED ORDER — CLONIDINE HCL 0.3 MG PO TABS
0.3000 mg | ORAL_TABLET | Freq: Two times a day (BID) | ORAL | Status: DC
  Administered 2011-10-02 – 2011-10-04 (×4): 0.3 mg via ORAL
  Filled 2011-10-02 (×7): qty 1

## 2011-10-02 MED ORDER — ACETAMINOPHEN 325 MG PO TABS
650.0000 mg | ORAL_TABLET | Freq: Four times a day (QID) | ORAL | Status: DC | PRN
  Filled 2011-10-02: qty 2

## 2011-10-02 MED ORDER — QUETIAPINE FUMARATE 50 MG PO TABS
50.0000 mg | ORAL_TABLET | Freq: Every day | ORAL | Status: DC
  Administered 2011-10-02 – 2011-10-03 (×2): 50 mg via ORAL
  Filled 2011-10-02 (×4): qty 1

## 2011-10-02 MED ORDER — SODIUM CHLORIDE 0.9 % IJ SOLN
3.0000 mL | Freq: Two times a day (BID) | INTRAMUSCULAR | Status: DC
  Administered 2011-10-02 – 2011-10-04 (×4): 3 mL via INTRAVENOUS

## 2011-10-02 MED ORDER — ONDANSETRON HCL 4 MG/2ML IJ SOLN
4.0000 mg | Freq: Four times a day (QID) | INTRAMUSCULAR | Status: DC | PRN
  Administered 2011-10-02: 4 mg via INTRAVENOUS
  Filled 2011-10-02 (×2): qty 2

## 2011-10-02 MED ORDER — ENALAPRIL MALEATE 10 MG PO TABS: 10.0000 mg | ORAL_TABLET | Freq: Once | ORAL | Status: DC

## 2011-10-02 MED ORDER — NITROGLYCERIN 0.4 MG/SPRAY TL SOLN
1.0000 | Status: DC | PRN
  Filled 2011-10-02: qty 4.9

## 2011-10-02 NOTE — ED Notes (Signed)
CARDIOLOGY AT BEDSIDE TRANSPORT TO [facility]  AFTER HE IS COMPLETED  REFUSED ZOFRAN   FOR N/V

## 2011-10-02 NOTE — ED Notes (Signed)
Patient transported to CT 

## 2011-10-02 NOTE — ED Notes (Signed)
Pt with multiple complaints for2-3 days, states "I am having stroke symptoms" pt with c/o head ache and chest discomfort, rambling negatively about nursing staff rather than answering questions for RN

## 2011-10-02 NOTE — ED Provider Notes (Signed)
History     CSN: [id number] Arrival date & time: 10/02/2011 11:30 AM   First MD Initiated Contact with Patient 10/02/11 1228      Chief Complaint  Patient presents with  . Weakness    right sided  . Headache    right sided  . Numbness    right jaw  . Chest Pain   HPI Patient presents to the emergency room with complaint weakness, headaches, neck pain and chest pain. Patient reports that he has some neck pain worse with movement. No known trauma or falls. Patient denies any fevers. Patient reports that he has had some chest pain that lasts about ten - 15 minutes at a time and radiates to his right shoulder. Worse with walking and exertion. Denies any history cardiac cath or stress test. Associated sob. No diaphoresis.   Past Medical History  Diagnosis Date  . Hypertension   . Low back pain     History reviewed. No pertinent past surgical history.  No family history on file.  History  Substance Use Topics  . Smoking status: Current Everyday Smoker  . Smokeless tobacco: Not on file  . Alcohol Use: No      Review of Systems  Constitutional: Negative for fever, chills, diaphoresis and appetite change.  HENT: Negative for neck pain.   Eyes: Negative for photophobia and visual disturbance.  Respiratory: Positive for shortness of breath. Negative for cough and chest tightness.   Cardiovascular: Positive for chest pain.  Gastrointestinal: Negative for nausea, vomiting and abdominal pain.  Genitourinary: Negative for flank pain.  Musculoskeletal: Positive for back pain. Negative for myalgias, joint swelling, arthralgias and gait problem.  Skin: Negative for rash.  Neurological: Positive for weakness and headaches. Negative for dizziness, tremors, seizures, syncope, facial asymmetry, speech difficulty, light-headedness and numbness.  All other systems reviewed and are negative.    Allergies  Review of patient's allergies indicates no known allergies.  Home Medications     Current Outpatient Rx  Name Route Sig Dispense Refill  . CLONIDINE HCL 0.3 MG PO TABS Oral Take 0.3 mg by mouth 2 (two) times daily.      . ENALAPRIL-HYDROCHLOROTHIAZIDE PO Oral Take 1 tablet by mouth daily. Enalapril 10-12.5mg      . INDOMETHACIN 25 MG PO CAPS Oral Take 75 mg by mouth daily.      [name] KETOCONAZOLE 200 MG PO TABS Oral Take 200 mg by mouth daily. As needed for itching hands      . QUETIAPINE FUMARATE 50 MG PO TABS Oral Take 50 mg by mouth at bedtime.      [name] VITAMIN D (ERGOCALCIFEROL) 50000 UNITS PO CAPS Oral Take 50,000 Units by mouth every 7 (seven) days.        BP 187/110  Pulse 54  Temp(Src) 97.6 F (36.4 C) (Oral)  Resp 12  Wt 178 lb (80.74 kg)  SpO2 100%  Physical Exam  Nursing note and vitals reviewed. Constitutional: He is oriented to person, place, and time. He appears well-developed and well-nourished.  HENT:  Head: Normocephalic and atraumatic.  Eyes: EOM are normal. Pupils are equal, round, and reactive to light.  Neck: Normal range of motion. Neck supple.  Cardiovascular: Normal rate, regular rhythm, normal heart sounds and intact distal pulses.  Exam reveals no gallop and no friction rub.   No murmur heard. Pulmonary/Chest: Effort normal and breath sounds normal. No respiratory distress. He exhibits no tenderness.  Abdominal: Soft. Bowel sounds are normal. He exhibits no distension. There  is no tenderness.  Musculoskeletal: Normal range of motion. He exhibits no edema and no tenderness.  Neurological: He is alert and oriented to person, place, and time. He displays no atrophy, no tremor and normal reflexes. No cranial nerve deficit or sensory deficit. He exhibits normal muscle tone. He displays a negative Romberg sign. He displays no seizure activity. Coordination and gait normal. GCS eye subscore is 4. GCS verbal subscore is 5. GCS motor subscore is 6.  Skin: Skin is warm and dry. No rash noted. No erythema. No pallor.  Psychiatric: He has a normal mood  and affect. His behavior is normal. Judgment and thought content normal.    ED Course  Procedures (including critical care time)  Patient seen and evaluated.  VSS reviewed. . Nursing notes reviewed. Discussed with and seen with dr. Christy Gentles, attending physician. Initial testing ordered. Will monitor the patient closely. They agree with the treatment plan and diagnosis.   Results for orders placed during the hospital encounter of 10/02/11  CBC      Component Value Range   WBC 7.0  4.0 - 10.5 (K/uL)   RBC 4.52  4.22 - 5.81 (MIL/uL)   Hemoglobin 12.7 (*) 13.0 - 17.0 (g/dL)   HCT 37.6 (*) 39.0 - 52.0 (%)   MCV 83.2  78.0 - 100.0 (fL)   MCH 28.1  26.0 - 34.0 (pg)   MCHC 33.8  30.0 - 36.0 (g/dL)   RDW 14.0  11.5 - 15.5 (%)   Platelets 227  150 - 400 (K/uL)  DIFFERENTIAL      Component Value Range   Neutrophils Relative 54  43 - 77 (%)   Neutro Abs 3.8  1.7 - 7.7 (K/uL)   Lymphocytes Relative 36  12 - 46 (%)   Lymphs Abs 2.6  0.7 - 4.0 (K/uL)   Monocytes Relative 8  3 - 12 (%)   Monocytes Absolute 0.6  0.1 - 1.0 (K/uL)   Eosinophils Relative 1  0 - 5 (%)   Eosinophils Absolute 0.1  0.0 - 0.7 (K/uL)   Basophils Relative 0  0 - 1 (%)   Basophils Absolute 0.0  0.0 - 0.1 (K/uL)  BASIC METABOLIC PANEL      Component Value Range   Sodium 137  135 - 145 (mEq/L)   Potassium 3.7  3.5 - 5.1 (mEq/L)   Chloride 105  96 - 112 (mEq/L)   CO2 25  19 - 32 (mEq/L)   Glucose, Bld 98  70 - 99 (mg/dL)   BUN 24 (*) 6 - 23 (mg/dL)   Creatinine, Ser 2.25 (*) 0.50 - 1.35 (mg/dL)   Calcium 9.4  8.4 - 10.5 (mg/dL)   GFR calc non Af Amer 31 (*) >90 (mL/min)   GFR calc Af Amer 36 (*) >90 (mL/min)  POCT I-STAT, CHEM 8      Component Value Range   Sodium 141  135 - 145 (mEq/L)   Potassium 3.8  3.5 - 5.1 (mEq/L)   Chloride 105  96 - 112 (mEq/L)   BUN 26 (*) 6 - 23 (mg/dL)   Creatinine, Ser 2.30 (*) 0.50 - 1.35 (mg/dL)   Glucose, Bld 83  70 - 99 (mg/dL)   Calcium, Ion 1.20  1.12 - 1.32 (mmol/L)   TCO2  24  0 - 100 (mmol/L)   Hemoglobin 14.3  13.0 - 17.0 (g/dL)   HCT 42.0  39.0 - 52.0 (%)  POCT TROPONIN      Component Value Range  Troponin i, poc 0.01  0.00 - 0.08 (ng/mL)   Comment 3           TROPONIN I      Component Value Range   Troponin I <0.30  <0.30 (ng/mL)   Dg Chest 2 View  10/02/2011  *RADIOLOGY REPORT*  Clinical Data: Chest pain  CHEST - 2 VIEW  Comparison: CT chest dated 02/21/2010  Findings: Lungs are clear.  No pleural effusion or pneumothorax.  Cardiomediastinal silhouette is within normal limits.  Mild degenerative changes of the visualized thoracolumbar spine. Degenerative changes of the right shoulder.  IMPRESSION: No evidence of acute cardiopulmonary disease.  Original Report Authenticated By: Julian Hy, M.D.   Ct Head Wo Contrast  10/02/2011  *RADIOLOGY REPORT*  Clinical Data: Headache, weakness  CT HEAD WITHOUT CONTRAST  Technique:  Contiguous axial images were obtained from the base of the skull through the vertex without contrast.  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  No acute hemorrhage, acute infarction, or mass lesion is seen.  Orbits and paranasal sinuses are normal. Periventricular white matter hypodensity likely indicate small vessel ischemic change.  IMPRESSION: No acute intracranial finding.  No significant change in chronic findings as above.  Original Report Authenticated By: Arline Asp, M.D.    Patient seen and evaluated.  VSS reviewed. . Nursing notes reviewed. Discussed with attending physician. Initial testing ordered. Will monitor the patient closely. They agree with the treatment plan and diagnosis.    [facility] hospitalist to admit, Dr. Eugenio Hoes for stress test, r/o cardiac disease.     Date: 10/02/2011, 11:39  Rate: 55  Rhythm: normal sinus rhythm  QRS Axis: normal  Intervals: normal   ST/T Wave abnormalities: early repolarization  Conduction Disutrbances:none  Narrative Interpretation:   Old EKG Reviewed: unchanged, 05/27/11   MDM  Chest pain Neck pain Headache        Benson Setting, PA 10/02/11 [place], PA 10/02/11 1511

## 2011-10-02 NOTE — H&P (Signed)
Darryl Parsons is an 55 y.o. male.   Chief Complaint: Headcahe HPI: [name] is a 55 year old Afro-American male who presented to the emergency room complaining of severe right-sided headache which originated behind the right eye went up to the frontal then top of his head and then to back to the neck area. This headache has been going on for about 2 days prior to coming to the emergency room but on the day of emergency room he was feeling kind of strange inside his head so he was a concern that he may be having a stroke so he came to the emergency room . In addition to that he had been having some episodes of substernal chest pain radiating to his right shoulder not associated with palpitations nausea vomiting or diaphoresis or shortness of breath. Patient states that he's been taking his blood pressure medicine. The last time he took his blood pressure medicine was at 5 this morning his blood pressure had been running very high according to the patient. Here in the emergency room his blood pressure was 180/110 however after giving him pain medication his blood pressure dropped to 154/94. He has a past medical history of chronic kidney disease stage II-III. and is followed at health services he states he's always having new doctors. Patient also has a history of what he states sounds like bipolar disorder but for which he takes Seroquel. Patient denies having any history of stress test for cardiac workup and he has multiple risk factors for heart disease. Specifically tobacco use hypertension but denies high cholesterol and is not sure family history.   Past Medical History  Diagnosis Date  . Hypertension   . Low back pain   . Chronic kidney disease   . Mental disorder     History reviewed. No pertinent past surgical history.  Family History  Problem Relation Age of Onset  . Cardiomyopathy Sister   . Factor VIII deficiency Neg Hx   . Lupus Sister   . Kidney failure Sister    Social History:   reports that he has been smoking.  He does not have any smokeless tobacco history on file. He reports that he does not drink alcohol or use illicit drugs.  Allergies: No Known Allergies  Medications Prior to Admission  Medication Dose Route Frequency Provider Last Rate Last Dose  . HYDROmorphone (DILAUDID) injection 1 mg  1 mg Intravenous Once [employer], PA   1 mg at 10/02/11 1320  . HYDROmorphone (DILAUDID) injection 1 mg  1 mg Intravenous Once [employer], [place]   1 mg at 10/02/11 1427  . DISCONTD: enalapril (VASOTEC) tablet 10 mg  10 mg Oral Once [employer], [place]       No current outpatient prescriptions on file as of 10/02/2011.    Results for orders placed during the hospital encounter of 10/02/11 (from the past 48 hour(s))  CBC     Status: Abnormal   Collection Time   10/02/11 12:50 PM      Component Value Range Comment   WBC 7.0  4.0 - 10.5 (K/uL)    RBC 4.52  4.22 - 5.81 (MIL/uL)    Hemoglobin 12.7 (*) 13.0 - 17.0 (g/dL)    HCT 37.6 (*) 39.0 - 52.0 (%)    MCV 83.2  78.0 - 100.0 (fL)    MCH 28.1  26.0 - 34.0 (pg)    MCHC 33.8  30.0 - 36.0 (g/dL)    RDW 14.0  11.5 - 15.5 (%)    Platelets 227  150 - 400 (K/uL)   DIFFERENTIAL     Status: Normal   Collection Time   10/02/11 12:50 PM      Component Value Range Comment   Neutrophils Relative 54  43 - 77 (%)    Neutro Abs 3.8  1.7 - 7.7 (K/uL)    Lymphocytes Relative 36  12 - 46 (%)    Lymphs Abs 2.6  0.7 - 4.0 (K/uL)    Monocytes Relative 8  3 - 12 (%)    Monocytes Absolute 0.6  0.1 - 1.0 (K/uL)    Eosinophils Relative 1  0 - 5 (%)    Eosinophils Absolute 0.1  0.0 - 0.7 (K/uL)    Basophils Relative 0  0 - 1 (%)    Basophils Absolute 0.0  0.0 - 0.1 (K/uL)   BASIC METABOLIC PANEL     Status: Abnormal   Collection Time   10/02/11 12:50 PM      Component Value Range Comment   Sodium 137  135 - 145 (mEq/L)    Potassium 3.7  3.5 - 5.1 (mEq/L)    Chloride 105  96 - 112 (mEq/L)    CO2 25  19 - 32 (mEq/L)      Glucose, Bld 98  70 - 99 (mg/dL)    BUN 24 (*) 6 - 23 (mg/dL)    Creatinine, Ser 2.25 (*) 0.50 - 1.35 (mg/dL)    Calcium 9.4  8.4 - 10.5 (mg/dL)    GFR calc non Af Amer 31 (*) >90 (mL/min)    GFR calc Af Amer 36 (*) >90 (mL/min)   TROPONIN I     Status: Normal   Collection Time   10/02/11 12:50 PM      Component Value Range Comment   Troponin I <0.30  <0.30 (ng/mL)   POCT TROPONIN     Status: Normal   Collection Time   10/02/11  1:43 PM      Component Value Range Comment   Troponin i, poc 0.01  0.00 - 0.08 (ng/mL)    Comment 3            POCT I-STAT, CHEM 8     Status: Abnormal   Collection Time   10/02/11  1:45 PM      Component Value Range Comment   Sodium 141  135 - 145 (mEq/L)    Potassium 3.8  3.5 - 5.1 (mEq/L)    Chloride 105  96 - 112 (mEq/L)    BUN 26 (*) 6 - 23 (mg/dL)    Creatinine, Ser 2.30 (*) 0.50 - 1.35 (mg/dL)    Glucose, Bld 83  70 - 99 (mg/dL)    Calcium, Ion 1.20  1.12 - 1.32 (mmol/L)    TCO2 24  0 - 100 (mmol/L)    Hemoglobin 14.3  13.0 - 17.0 (g/dL)    HCT 42.0  39.0 - 52.0 (%)    Dg Chest 2 View  10/02/2011  *RADIOLOGY REPORT*  Clinical Data: Chest pain  CHEST - 2 VIEW  Comparison: CT chest dated 02/21/2010  Findings: Lungs are clear.  No pleural effusion or pneumothorax.  Cardiomediastinal silhouette is within normal limits.  Mild degenerative changes of the visualized thoracolumbar spine. Degenerative changes of the right shoulder.  IMPRESSION: No evidence of acute cardiopulmonary disease.  Original Report Authenticated By: Julian Hy, M.D.   Ct Head Wo Contrast  10/02/2011  *RADIOLOGY REPORT*  Clinical Data: Headache,  weakness  CT HEAD WITHOUT CONTRAST  Technique:  Contiguous axial images were obtained from the base of the skull through the vertex without contrast.  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  Bilateral remote external capsule lacunar infarcts again  noted.  No acute hemorrhage, acute infarction, or mass lesion is seen.  Orbits and paranasal sinuses are normal. Periventricular white matter hypodensity likely indicate small vessel ischemic change.  IMPRESSION: No acute intracranial finding.  No significant change in chronic findings as above.  Original Report Authenticated By: Arline Asp, M.D.    Review of Systems  Constitutional: Negative.   HENT: Positive for neck pain. Negative for hearing loss, ear pain, nosebleeds, congestion, sore throat, tinnitus and ear discharge.   Eyes: Negative for blurred vision, double vision, photophobia, pain, discharge and redness.  Respiratory: Negative.  Negative for stridor.   Cardiovascular: Positive for chest pain. Negative for palpitations, orthopnea, claudication, leg swelling and PND.  Gastrointestinal: Negative for heartburn, nausea, vomiting, abdominal pain, diarrhea, constipation, blood in stool and melena.  Genitourinary: Negative.   Musculoskeletal: Positive for back pain. Negative for myalgias.  Skin: Negative.   Neurological: Positive for headaches. Negative for dizziness, tingling, tremors, sensory change, speech change, focal weakness and seizures.  Endo/Heme/Allergies: Negative.   Psychiatric/Behavioral: Positive for depression. Negative for suicidal ideas, hallucinations, memory loss and substance abuse. The patient is nervous/anxious and has insomnia.     Blood pressure 187/110, pulse 54, temperature 97.6 F (36.4 C), temperature source Oral, resp. rate 12, weight 80.74 kg (178 lb), SpO2 100.00%. Physical Exam  Constitutional: He is oriented to person, place, and time. He appears well-developed and well-nourished. No distress.  HENT:  Head: Normocephalic and atraumatic.  Right Ear: External ear normal.  Left Ear: External ear normal.  Nose: Nose normal.  Mouth/Throat: Oropharynx is clear and moist.  Eyes: Conjunctivae and EOM are normal. Pupils are equal, round, and reactive  to light. Right eye exhibits no discharge. Left eye exhibits no discharge. No scleral icterus.  Neck: Normal range of motion. Neck supple. No JVD present. No tracheal deviation present. No thyromegaly present.  Cardiovascular: Normal rate, regular rhythm, normal heart sounds and intact distal pulses.  Exam reveals no friction rub.   No murmur heard. Respiratory: Effort normal and breath sounds normal. No respiratory distress. He has no wheezes. He has no rales. He exhibits no tenderness.  GI: Soft. Bowel sounds are normal. He exhibits no distension and no mass. There is no tenderness. There is no rebound and no guarding.  Musculoskeletal: Normal range of motion.  Lymphadenopathy:    He has no cervical adenopathy.  Neurological: He is alert and oriented to person, place, and time. He has normal reflexes. No cranial nerve deficit. He exhibits normal muscle tone. Coordination normal.  Skin: Skin is warm and dry. No rash noted. No erythema. No pallor.  Psychiatric: He has a normal mood and affect. His behavior is normal. Judgment and thought content normal.     Assessment/Plan Atypical Headache Chest pain atypical for CAD but patient with multiple risk factors CKD stage II -III Hypertension out of control Plan admit to Observation          R/O MI         Telemetry         Cardiology consult         [facility] [id number]  Jorryn Hershberger 10/02/2011, 3:32 PM

## 2011-10-02 NOTE — ED Provider Notes (Signed)
Medical screening examination/treatment/procedure(s) were conducted as a shared visit with non-physician practitioner(s) and myself.  I personally evaluated the patient during the encounter  Pt seen with PA, no distress, EKG reviewed No focal motor deficits noted  Sharyon Cable, MD 10/02/11 [phone]

## 2011-10-02 NOTE — Consults (Addendum)
Admit date: 10/02/2011 Referring Physician  Dr. Eugenio Hoes Primary Physician No primary provider on file. Primary Cardiologist  none Reason for Consultation  Consultation at the request of Dr. Eugenio Hoes for the evaluation of chest pain  HPI: 55 year old male with uncontrolled hypertension, prior cocaine use who presented to the [facility] emergency department with complaint of right-sided headache and cervical neck pain. Denies any trouble with breathing, no palpitations. He admits that he is having trouble sleeping lately. He smokes approximately 2 packs per week. Very rarely does he drink alcohol but he did admit to using cocaine in the past.  I asked him carefully about what brought him into the emergency room he stated that his head and neck pain did. At the very end of the interview last him if anything else was bothering him and he said no. I then asked him if he was having any chest pain and he said yes on the right side upper chest wall that had been quite continuous. I asked him how long this was going on and he stated over the past minute.  After obtaining this history, I spoke with the physician who initially performed the assessment and she stated that she obtained a history of right sided chest discomfort radiating to the shoulder worse with walking. He did not state this to me.  He denies any recent rashes, nausea, vomiting, dysphasia, difficulty ambulating, bleeding, palpitations, syncope. He also denies any prior cardiovascular issues such as prior myocardial infarction during his use of cocaine.  I was unable to elicit a family history of coronary artery disease.  PMH:   Past Medical History  Diagnosis Date  . Hypertension   . Low back pain   . Chronic kidney disease   . Mental disorder     PSH:  History reviewed. No pertinent past surgical history. Allergies:  Review of patient's allergies indicates no known allergies.  Home MEDS:   cloNIDine (CATAPRES) 0.3 MG tablet      ENALAPRIL-HYDROCHLOROTHIAZIDE PO      indomethacin (INDOCIN) 25 MG capsule      ketoconazole (NIZORAL) 200 MG tablet      QUEtiapine (SEROQUEL) 50 MG tablet      Vitamin D, Ergocalciferol, (DRISDOL) 50000 UNITS CAPS           Current MEDS:     .  HYDROmorphone (DILAUDID) injection  1 mg Intravenous Once  .  HYDROmorphone (DILAUDID) injection  1 mg Intravenous Once  . DISCONTD: enalapril  10 mg Oral Once   Fam HX:    Family History  Problem Relation Age of Onset  . Cardiomyopathy Sister   . Factor VIII deficiency Neg Hx   . Lupus Sister   . Kidney failure Sister    Social HX:    History   Social History  . Marital Status: Single    Spouse Name: N/A    Number of Children: N/A  . Years of Education: N/A   Occupational History  . Not on file.   Social History Main Topics  . Smoking status: Current Everyday Smoker -- 0.5 packs/day  . Smokeless tobacco: Not on file  . Alcohol Use: No  . Drug Use: No  . Sexually Active:    Other Topics Concern  . Not on file   Social History Narrative  . No narrative on file     ROS:  All 11 ROS were addressed and are negative except what is stated in the HPI  Physical Exam: Blood pressure 175/108,  pulse 77, temperature 97.7 F (36.5 C), temperature source Oral, resp. rate 17, weight 80.74 kg (178 lb), SpO2 98.00%.    General: Well developed, well nourished, in no acute distress Head: Eyes PERRLA, No xanthomas.   Normal cephalic and atramatic  Lungs:   Clear bilaterally to auscultation and percussion. Normal respiratory effort. No wheezes, no rales. Heart:   HRRR S1 S2 Pulses are 2+ & equal.            No carotid bruit. No JVD.  No abdominal bruits. No femoral bruits. Abdomen: Bowel sounds are positive, abdomen soft and non-tender without masses or                  Hernia's noted. No hepatosplenomegaly. Msk:  Back normal, normal gait. Normal strength and tone for age. Extremities:   No clubbing, cyanosis or edema.  DP  +1 Neuro: Alert and oriented X 3, non-focal, MAE x 4 GU: Deferred Rectal: Deferred Psych:  Good affect, responds appropriately    Labs:   Lab Results  Component Value Date   WBC 7.0 10/02/2011   HGB 14.3 10/02/2011   HCT 42.0 10/02/2011   MCV 83.2 10/02/2011   PLT 227 10/02/2011    Lab 10/02/11 1345 10/02/11 1250  NA 141 --  K 3.8 --  CL 105 --  CO2 -- 25  BUN 26* --  CREATININE 2.30* --  CALCIUM -- 9.4  PROT -- --  BILITOT -- --  ALKPHOS -- --  ALT -- --  AST -- --  GLUCOSE 83 --   No results found for this basename: PTT   Lab Results  Component Value Date   INR 1.09 05/27/2011   INR 1.0 12/08/2007   Lab Results  Component Value Date   TROPONINI <0.30 10/02/2011     Lab Results  Component Value Date   CHOL 174 06/28/2008   CHOL  Value: 168        ATP III CLASSIFICATION:  <200     mg/dL   Desirable  200-239  mg/dL   Borderline High  >=240    mg/dL   High 12/11/2007   Lab Results  Component Value Date   HDL 63 06/28/2008   HDL 51 12/11/2007   Lab Results  Component Value Date   LDLCALC 100* 06/28/2008   LDLCALC  Value: 93        Total Cholesterol/HDL:CHD Risk Coronary Heart Disease Risk Table                     Men   Women  1/2 Average Risk   3.4   3.3 12/11/2007   Lab Results  Component Value Date   TRIG 54 06/28/2008   TRIG 120 12/11/2007   Lab Results  Component Value Date   CHOLHDL 2.8 Ratio 06/28/2008   CHOLHDL 3.3 12/11/2007   No results found for this basename: LDLDIRECT      Radiology:  Dg Chest 2 View  10/02/2011  *RADIOLOGY REPORT*  Clinical Data: Chest pain  CHEST - 2 VIEW  Comparison: CT chest dated 02/21/2010  Findings: Lungs are clear.  No pleural effusion or pneumothorax.  Cardiomediastinal silhouette is within normal limits.  Mild degenerative changes of the visualized thoracolumbar spine. Degenerative changes of the right shoulder.  IMPRESSION: No evidence of acute cardiopulmonary disease.  Original Report Authenticated By: Julian Hy, M.D.   Ct Head Wo Contrast  10/02/2011  *RADIOLOGY REPORT*  Clinical Data: Headache, weakness  CT  HEAD WITHOUT CONTRAST  Technique:  Contiguous axial images were obtained from the base of the skull through the vertex without contrast.  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  No acute hemorrhage, acute infarction, or mass lesion is seen.  Orbits and paranasal sinuses are normal. Periventricular white matter hypodensity likely indicate small vessel ischemic change.  IMPRESSION: No acute intracranial finding.  No significant change in chronic findings as above.  Original Report Authenticated By: Arline Asp, M.D.    EKG:  Sinus rhythm heart rate 70 with J-point elevation in lead V2 through V3, LVH pattern. Telemetry demonstrates sinus arrhythmia with occasional sinus bradycardia into the mid 40s and occasional brief episodes of paroxysmal atrial tachycardia.  ASSESSMENT/ PLAN:  55 year old male with uncontrolled hypertension, right-sided headache, with right-sided chest pain, atypical.  Atypical chest pain- as stated above in the H&P, it took until the end of the history for me to elicit the fact that he was having chest discomfort and when he did he stated that he was having mild right-sided pain over the past minute. His is very atypical in nature. No associated diaphoresis. He does have cardiac risk factors such as hypertension, prior cocaine use. I carefully checked his pulses in both arms given his severe hypertension and they were equal on both sides. Chest x-ray showed degenerative changes on his right shoulder but no acute chest findings.  Main objectives for him are hypertension control. He needs to establish with a primary care physician to help him with this. His chest pain is very atypical, right-sided. At this point, I would not  perform any further cardiac testing. If he did have more typical anginal symptoms, then further testing would be warranted. At this time, no further stress test. He does not have any physical exam evidence of aortic dissection as well. I expressed the importance of him for hypertension control. Elevated blood pressure like he has can contribute to shortness, heart attack, renal failure.  If cardiac markers elevate, further testing may be warranted. EKG demonstrates left ventricular hypertrophy, J-point elevation.  Chronic kidney disease - creatinine is 2.3. On ACE inhibitor. I would try to avoid indomethacin as was previously ordered.  Uncontrolled hypertension-  on enalapril/hydrochlorothiazide, clonidine. I'm unsure if he is taking his medications. Consider addition of amlodipine. Per primary team.  Discussed with Dr. Eugenio Hoes, admitting physician.  Candee Furbish, MD  10/02/2011  4:59 PM

## 2011-10-03 ENCOUNTER — Other Ambulatory Visit: Payer: Self-pay

## 2011-10-03 LAB — CBC
HCT: 37 % — ABNORMAL LOW (ref 39.0–52.0)
Hemoglobin: 12.4 g/dL — ABNORMAL LOW (ref 13.0–17.0)
MCH: 27.6 pg (ref 26.0–34.0)
MCHC: 33.5 g/dL (ref 30.0–36.0)
MCV: 82.4 fL (ref 78.0–100.0)
RBC: 4.49 MIL/uL (ref 4.22–5.81)

## 2011-10-03 LAB — CARDIAC PANEL(CRET KIN+CKTOT+MB+TROPI)
CK, MB: 2.5 ng/mL (ref 0.3–4.0)
CK, MB: 2.9 ng/mL (ref 0.3–4.0)
Relative Index: 1.3 (ref 0.0–2.5)
Relative Index: 1.3 (ref 0.0–2.5)
Total CK: 196 U/L (ref 7–232)
Total CK: 190 U/L (ref 7–232)

## 2011-10-03 LAB — BASIC METABOLIC PANEL
BUN: 25 mg/dL — ABNORMAL HIGH (ref 6–23)
CO2: 26 mEq/L (ref 19–32)
Chloride: 101 mEq/L (ref 96–112)
GFR calc non Af Amer: 34 mL/min — ABNORMAL LOW (ref >90)
Glucose, Bld: 89 mg/dL (ref 70–99)
Potassium: 3.7 mEq/L (ref 3.5–5.1)
Sodium: 135 mEq/L (ref 135–145)

## 2011-10-03 LAB — D-DIMER, QUANTITATIVE: D-Dimer, Quant: 0.3 ug/mL-FEU (ref 0.00–0.48)

## 2011-10-03 NOTE — Progress Notes (Signed)
Subjective: Patient's chest pain has resolved.  Objective: Vital signs in last 24 hours: Temp:  [97.3 F (36.3 C)-98.1 F (36.7 C)] 97.3 F (36.3 C) (11/18 1400) Pulse Rate:  [57-64] 57  (11/18 1400) Resp:  [18] 18  (11/18 1400) BP: (126-176)/(82-105) 140/90 mmHg (11/18 1400) SpO2:  [97 %-100 %] 97 % (11/18 1400) Weight:  [83.7 kg (184 lb 8.4 oz)] 184 lb 8.4 oz (83.7 kg) (11/17 1737) Weight change:   -Intake/Output from previous day:    Blood pressure 140/90, pulse 57, temperature 97.3 F (36.3 C), temperature source Oral, resp. rate 18, height 5\' 8"  (1.727 m), weight 83.7 kg (184 lb 8.4 oz), SpO2 97.00%. General: Patient appears his stated age. HEENT: Head normocephalic atraumatic. Lungs: Clear to auscultation bilaterally. Cardiovascular: Regular rate rhythm. Extremities: No edema  Lab Results: Lab Results  Component Value Date   WBC 8.5 10/03/2011   HGB 12.4* 10/03/2011   HCT 37.0* 10/03/2011   MCV 82.4 10/03/2011   PLT 225 10/03/2011    BMET  Lab 10/03/11 0730  NA 135  K 3.7  CL 101  CO2 26  BUN 25*  CREATININE 2.12*  LABGLOM --  GLUCOSE 89  CALCIUM 9.7    Studies/Results: Dg Chest 2 View  10/02/2011  *RADIOLOGY REPORT*  Clinical Data: Chest pain  CHEST - 2 VIEW  Comparison: CT chest dated 02/21/2010  Findings: Lungs are clear.  No pleural effusion or pneumothorax.  Cardiomediastinal silhouette is within normal limits.  Mild degenerative changes of the visualized thoracolumbar spine. Degenerative changes of the right shoulder.  IMPRESSION: No evidence of acute cardiopulmonary disease.  Original Report Authenticated By: Julian Hy, M.D.   Ct Head Wo Contrast  10/02/2011  *RADIOLOGY REPORT*  Clinical Data: Headache, weakness  CT HEAD WITHOUT CONTRAST  Technique:  Contiguous axial images were obtained from the base of the skull through the vertex without contrast.  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  No acute hemorrhage, acute infarction, or mass lesion is seen.  Orbits and paranasal sinuses are normal. Periventricular white matter hypodensity likely indicate small vessel ischemic change.  IMPRESSION: No acute intracranial finding.  No significant change in chronic findings as above.  Original Report Authenticated By: Arline Asp, M.D.    Medications:  Current Facility-Administered Medications  Medication Dose Route Frequency Provider Last Rate Last Dose  . 0.9 %  sodium chloride infusion  250 mL Intravenous Continuous Erma Pinto, MD      . acetaminophen (TYLENOL) tablet 650 mg  650 mg Oral Q6H PRN Erma Pinto, MD       Or  . acetaminophen (TYLENOL) suppository 650 mg  650 mg Rectal Q6H PRN Erma Pinto, MD      . cloNIDine (CATAPRES) tablet 0.3 mg  0.3 mg Oral BID Erma Pinto, MD   0.3 mg at 10/03/11 1104  . enalapril (VASOTEC) tablet 10 mg  10 mg Oral Daily Erma Pinto, MD   10 mg at 10/03/11 1104   And  . hydrochlorothiazide (HYDRODIURIL) tablet 25 mg  25 mg Oral Daily Erma Pinto, MD   25 mg at 10/03/11 1104  . enoxaparin (LOVENOX) injection 40 mg  40 mg Subcutaneous Q24H Erma Pinto, MD      . HYDROmorphone (DILAUDID) injection 1 mg  1 mg Intravenous Q4H PRN Erma Pinto, MD      . nitroGLYCERIN (NITROLINGUAL) 0.4  MG/SPRAY spray 1 spray  1 spray Sublingual Q5 Min x 3 PRN Erma Pinto, MD      . ondansetron [facility]) injection 4 mg  4 mg Intravenous Q6H PRN Erma Pinto, MD   4 mg at 10/02/11 1935  . oxyCODONE (Oxy IR/ROXICODONE) immediate release tablet 5 mg  5 mg Oral Q4H PRN Erma Pinto, MD   5 mg at 10/03/11 1419  . QUEtiapine (SEROQUEL) tablet 50 mg  50 mg Oral QHS Erma Pinto, MD   50 mg at 10/02/11 2236  . sodium chloride 0.9 % injection 3 mL  3 mL Intravenous Q12H Erma Pinto, MD   3 mL at 10/03/11 1105  . sodium chloride 0.9 % injection 3 mL  3 mL Intravenous PRN  Erma Pinto, MD      . Vitamin D (Ergocalciferol) (DRISDOL) capsule 50,000 Units  50,000 Units Oral Q7 days Erma Pinto, MD   50,000 Units at 10/02/11 1950  . DISCONTD: enalapril-hydrochlorothiazide (VASERETIC) 10-25 MG per tablet 1 tablet  1 tablet Oral 1 day or 1 dose Erma Pinto, MD        Assessment/Plan: Acute headache Headache is most likely secondary to hypertension. Headache has now resolved.  Substernal chest pain Cardiac markers are all negative. Patient was evaluated by cardiology no further workup needed at this time.  Will check a d-dimer.    Hypertension Patient's blood pressure has improved on antihypertensives. Continue his antihypertensive medication.   Chronic kidney disease (CKD), stage III (moderate) Patient's chronic kidney disease is stable. Encourage him to always take his antihypertensive medications to prevent further progression of his kidney disease.   Bipolar 1 disorder, depressed, mild Continue him on Seroquel.  Disposition: Will discharge patient tomorrow. He states that the bus stop running at 6 PM and he will would not be able to get home.   LOS: 1 day   Rosana Hoes MD, Sharlet Salina 10/03/2011, 5:26 PM

## 2011-10-03 NOTE — Progress Notes (Signed)
Took over care of this patient at 2330, went in to the room to introduce myself and assess patient's needs, phlebotomy had just come out of the room so the patient was awake, Darryl Parsons was agitated stating that the person before me had told him that no one would come in his room until 8am and we are disturbing him. I explained to Darryl Parsons that it was true that he did not have labs scheduled until 8am but as far as nursing we would be coming in to do an EKG at 4am per scheduled order, and as needed when his heart monitor would need checking, but we would try to limit his disturbances to the best that we could. At 1230 his lead was off on his telemetry monitor, when the NT went in to fix his leads he became angry and stated that he was leaving. She reported this to me, when I went in the room to talk to Darryl Parsons he was standing by his bed, stated "you don't know me, I will jump through the glass". I explained to Darryl Parsons that we had to ensure he was safe and fix his heart monitor when needed, he walked over to the closet and picked up a chair that was in front of the closet and shoved it over to the bed. His significant other was pleading with him to get back in the bed and called his mother. He stated that he would throw all the stuff in the room out the window if we keep bothering him. I called Darryl Parsons, AC to inform him of patient's behavior and desire to leave. Darryl Parsons stated to call Darryl Parsons to have them come up to the room, security was also called and responded to room. Patient agreed to stay. I explained to patient that we would need to come in at 4am to do his EKG and we would need to fix his heart monitor if needed. He verbalized understanding.

## 2011-10-04 LAB — CBC
Hemoglobin: 13 g/dL (ref 13.0–17.0)
MCH: 27.8 pg (ref 26.0–34.0)
MCHC: 33.2 g/dL (ref 30.0–36.0)
RDW: 14.4 % (ref 11.5–15.5)

## 2011-10-04 LAB — BASIC METABOLIC PANEL
BUN: 25 mg/dL — ABNORMAL HIGH (ref 6–23)
Calcium: 10 mg/dL (ref 8.4–10.5)
GFR calc Af Amer: 36 mL/min — ABNORMAL LOW (ref >90)
GFR calc non Af Amer: 31 mL/min — ABNORMAL LOW (ref >90)
Glucose, Bld: 99 mg/dL (ref 70–99)
Sodium: 137 mEq/L (ref 135–145)

## 2011-10-04 LAB — POCT I-STAT, CHEM 8
Calcium, Ion: 1.2 mmol/L (ref 1.12–1.32)
Chloride: 105 mEq/L (ref 96–112)
Creatinine, Ser: 2.3 mg/dL — ABNORMAL HIGH (ref 0.50–1.35)
Glucose, Bld: 83 mg/dL (ref 70–99)
HCT: 42 % (ref 39.0–52.0)

## 2011-10-04 MED ORDER — ENALAPRIL-HYDROCHLOROTHIAZIDE 10-25 MG PO TABS: 1.0000 | ORAL_TABLET | Freq: Every day | ORAL | Status: DC

## 2011-10-04 MED ORDER — CLONIDINE HCL 0.3 MG PO TABS: 0.3000 mg | ORAL_TABLET | Freq: Two times a day (BID) | ORAL | Status: DC

## 2011-10-04 MED ORDER — OXYCODONE HCL 5 MG PO TABS: 15.0000 mg | ORAL_TABLET | ORAL | Status: DC | PRN

## 2011-10-04 MED ORDER — OXYCODONE HCL 10 MG PO TABS: 10.0000 mg | ORAL_TABLET | ORAL | Status: AC | PRN

## 2011-10-04 MED ORDER — QUETIAPINE FUMARATE 50 MG PO TABS: 50.0000 mg | ORAL_TABLET | Freq: Every day | ORAL | Status: DC

## 2011-10-04 NOTE — Progress Notes (Signed)
PATIENT IS ACTIVE WITH [facility] FOR MEDICAL [facility]ARE; APT MADE FOR December 03, 2011 AT 3 PM WITH DR Brigitte Pulse; INFORMATION GIVEN TO THE PATIENT, ALSO PATIENT IS TRYING TO GET DISABILITY ALSO; B. Dulcie Gammon RN, BSN, MHA

## 2011-10-04 NOTE — Progress Notes (Signed)
PATIENT QUALIFIES FOR THE INDIGENT FUNDS 3 DAY MEDICATION SUPPLY; SOCIAL WORKER IS AWARE OF BUS PASS NEEDED FOR DISCHARGE; B Keyleigh Manninen RN, BSN, MHA

## 2011-10-04 NOTE — Discharge Summary (Signed)
Physician Discharge Summary  Patient ID: Darryl Parsons MRN: [id number] DOB/AGE: Jan 20, 1956 55 y.o.  Admit date: 10/02/2011 Discharge date: 10/04/2011  Discharge Diagnoses:   Acute headache  Substernal chest pain  Hypertension, renal disease  Kidney disease due to severe high blood pressure  Chronic kidney disease (CKD), stage III (moderate)  Bipolar 1 disorder, depressed, mild   Current Discharge Medication List    START taking these medications   Details  oxyCODONE 10 MG TABS Take 1 tablet (10 mg total) by mouth every 4 (four) hours as needed. Qty: 30 tablet, Refills: 0      CONTINUE these medications which have CHANGED   Details  cloNIDine (CATAPRES) 0.3 MG tablet Take 1 tablet (0.3 mg total) by mouth 2 (two) times daily. Qty: 6 tablet, Refills: 0    enalapril-hydrochlorothiazide (VASERETIC) 10-25 MG per tablet Take 1 tablet by mouth daily. Qty: 3 tablet, Refills: 0    QUEtiapine (SEROQUEL) 50 MG tablet Take 1 tablet (50 mg total) by mouth at bedtime. Qty: 3 tablet, Refills: 0      CONTINUE these medications which have NOT CHANGED   Details  indomethacin (INDOCIN) 25 MG capsule Take 75 mg by mouth daily.      ketoconazole (NIZORAL) 200 MG tablet Take 200 mg by mouth daily. As needed for itching hands      Vitamin D, Ergocalciferol, (DRISDOL) 50000 UNITS CAPS Take 50,000 Units by mouth every 7 (seven) days.          Discharge Orders    Future Orders Please Complete By Expires   Diet - low sodium heart healthy      Increase activity slowly         Follow-up Information    Follow up with [facility] on 12/03/2011. (AT 3PM WITH DR Brigitte Pulse; PLEASE CALL [facility] IF UNABLE TO KEEP APT)          Disposition: Home or Self Care  Discharged Condition: Stable  No Order   Consults: Cardiology   HPI: HPI: Darryl Parsons is a 55 year old Afro-American male who presented to the emergency room complaining of severe right-sided headache which originated behind  the right eye went up to the frontal then top of his head and then to back to the neck area. This headache has been going on for about 2 days prior to coming to the emergency room but on the day of emergency room he was feeling kind of strange inside his head so he was a concern that he may be having a stroke so he came to the emergency room . In addition to that he had been having some episodes of substernal chest pain radiating to his right shoulder not associated with palpitations nausea vomiting or diaphoresis or shortness of breath. Patient states that he's been taking his blood pressure medicine. The last time he took his blood pressure medicine was at 5 this morning his blood pressure had been running very high according to the patient. Here in the emergency room his blood pressure was 180/110 however after giving him pain medication his blood pressure dropped to 154/94. He has a past medical history of chronic kidney disease stage II-III. and is followed at health services he states he's always having new doctors. Patient also has a history of what he states sounds like bipolar disorder but for which he takes Seroquel. Patient denies having any history of stress test for cardiac workup and he has multiple risk factors for heart disease. Specifically tobacco use  hypertension but denies high cholesterol and is not sure family history.    PMH:  As per H & P FH: As per  H & P SH: As per H & P Med: as per H & P Allergies and ROS: as per H&P   Discharge Exam: Blood pressure 149/89, pulse 57, temperature 97.6 F (36.4 C), temperature source Oral, resp. rate 18, height 5\' 8"  (1.727 m), weight 83.7 kg (184 lb 8.4 oz), SpO2 98.00%. General appearance: alert and cooperative Head: Normocephalic, without obvious abnormality, atraumatic Resp: clear to auscultation bilaterally Cardio: regular rate and rhythm, S1, S2 normal, no murmur, click, rub or gallop GI: soft, non-tender; bowel sounds normal; no  masses,  no organomegaly Extremities: extremities normal, atraumatic, no cyanosis or edema  Hospital Course:  Acute headache Patient complains of a headache. He had a CAT scan of his head that was negative. Most likely his headache was due to his hypertensive urgency. His headache has since resolved. He was told to make sure to take all his blood pressure medications and to [facility].   Substernal chest pain Patient was evaluated by cardiology for his chest pain. Cardiology thought most likely the pain was secondary to his uncontrolled hypertension. The recommendation is to closely control his blood pressure. Patient to follow [facility] with [facility] to monitor his blood pressure and to get medication refills.   Hypertension, renal disease Blood pressure improved with him on the medication. He was told not to discontinue the clonidine as he would get rebound hypertension and to follow [facility] with [facility].   Chronic kidney disease (CKD), stage III (moderate) His chronic kidney disease is secondary to poorly controlled blood pressure. Encouraged patient to monitor his blood pressure carefully.   Bipolar 1 disorder, depressed, mild Continue him on his Seroquel Labs:   Results for orders placed during the hospital encounter of 10/02/11 (from the past 48 hour(s))  CARDIAC PANEL(CRET KIN+CKTOT+MB+TROPI)     Status: Normal   Collection Time   10/02/11 11:40 PM      Component Value Range Comment   Total CK 200  7 - 232 (U/L)    CK, MB 2.9  0.3 - 4.0 (ng/mL)    Troponin I <0.30  <0.30 (ng/mL)    Relative Index 1.5  0.0 - 2.5    BASIC METABOLIC PANEL     Status: Abnormal   Collection Time   10/03/11  7:30 AM      Component Value Range Comment   Sodium 135  135 - 145 (mEq/L)    Potassium 3.7  3.5 - 5.1 (mEq/L)    Chloride 101  96 - 112 (mEq/L)    CO2 26  19 - 32 (mEq/L)    Glucose, Bld 89  70 - 99 (mg/dL)    BUN 25 (*) 6 - 23 (mg/dL)    Creatinine, Ser 2.12 (*) 0.50 - 1.35  (mg/dL)    Calcium 9.7  8.4 - 10.5 (mg/dL)    GFR calc non Af Amer 34 (*) >90 (mL/min)    GFR calc Af Amer 39 (*) >90 (mL/min)   CBC     Status: Abnormal   Collection Time   10/03/11  7:30 AM      Component Value Range Comment   WBC 8.5  4.0 - 10.5 (K/uL)    RBC 4.49  4.22 - 5.81 (MIL/uL)    Hemoglobin 12.4 (*) 13.0 - 17.0 (g/dL)    HCT 37.0 (*) 39.0 - 52.0 (%)  MCV 82.4  78.0 - 100.0 (fL)    MCH 27.6  26.0 - 34.0 (pg)    MCHC 33.5  30.0 - 36.0 (g/dL)    RDW 14.3  11.5 - 15.5 (%)    Platelets 225  150 - 400 (K/uL)   CARDIAC PANEL(CRET KIN+CKTOT+MB+TROPI)     Status: Normal   Collection Time   10/03/11  7:30 AM      Component Value Range Comment   Total CK 196  7 - 232 (U/L)    CK, MB 2.6  0.3 - 4.0 (ng/mL)    Troponin I <0.30  <0.30 (ng/mL)    Relative Index 1.3  0.0 - 2.5    CARDIAC PANEL(CRET KIN+CKTOT+MB+TROPI)     Status: Normal   Collection Time   10/03/11  4:32 PM      Component Value Range Comment   Total CK 190  7 - 232 (U/L)    CK, MB 2.5  0.3 - 4.0 (ng/mL)    Troponin I <0.30  <0.30 (ng/mL)    Relative Index 1.3  0.0 - 2.5    D-DIMER, QUANTITATIVE     Status: Normal   Collection Time   10/03/11  5:50 PM      Component Value Range Comment   D-Dimer, Quant 0.30  0.00 - 0.48 (ug/mL-FEU)   CBC     Status: Normal   Collection Time   10/04/11  5:40 AM      Component Value Range Comment   WBC 7.1  4.0 - 10.5 (K/uL)    RBC 4.68  4.22 - 5.81 (MIL/uL)    Hemoglobin 13.0  13.0 - 17.0 (g/dL)    HCT 39.2  39.0 - 52.0 (%)    MCV 83.8  78.0 - 100.0 (fL)    MCH 27.8  26.0 - 34.0 (pg)    MCHC 33.2  30.0 - 36.0 (g/dL)    RDW 14.4  11.5 - 15.5 (%)    Platelets 236  150 - 400 (K/uL)   BASIC METABOLIC PANEL     Status: Abnormal   Collection Time   10/04/11  5:40 AM      Component Value Range Comment   Sodium 137  135 - 145 (mEq/L)    Potassium 4.0  3.5 - 5.1 (mEq/L)    Chloride 103  96 - 112 (mEq/L)    CO2 27  19 - 32 (mEq/L)    Glucose, Bld 99  70 - 99 (mg/dL)     BUN 25 (*) 6 - 23 (mg/dL)    Creatinine, Ser 2.28 (*) 0.50 - 1.35 (mg/dL)    Calcium 10.0  8.4 - 10.5 (mg/dL)    GFR calc non Af Amer 31 (*) >90 (mL/min)    GFR calc Af Amer 36 (*) >90 (mL/min)     Diagnostics:  Dg Chest 2 View  10/02/2011  *RADIOLOGY REPORT*  Clinical Data: Chest pain  CHEST - 2 VIEW  Comparison: CT chest dated 02/21/2010  Findings: Lungs are clear.  No pleural effusion or pneumothorax.  Cardiomediastinal silhouette is within normal limits.  Mild degenerative changes of the visualized thoracolumbar spine. Degenerative changes of the right shoulder.  IMPRESSION: No evidence of acute cardiopulmonary disease.  Original Report Authenticated By: Julian Hy, M.D.   Ct Head Wo Contrast  10/02/2011  *RADIOLOGY REPORT*  Clinical Data: Headache, weakness  CT HEAD WITHOUT CONTRAST  Technique:  Contiguous axial images were obtained from the base of the skull through the vertex without contrast.  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  No acute hemorrhage, acute infarction, or mass lesion is seen.  Orbits and paranasal sinuses are normal. Periventricular white matter hypodensity likely indicate small vessel ischemic change.  IMPRESSION: No acute intracranial finding.  No significant change in chronic findings as above.  Original Report Authenticated By: Arline Asp, M.D.      SignedRosana Hoes MD, [facility] 10/04/2011, 6:25 PM

## 2011-10-04 NOTE — Progress Notes (Signed)
No changes noted since am assessment. IV DC. Patient DC to home with significant other. Care Manager gave patient a bus pass and a 3 day supply of meds. Patient refused a wheelchair and asked to walk out himself.  RN retrieved meds from pharmacy and gave them to the patient. Patient denies further questions or concerns at this time.

## 2011-10-04 NOTE — Progress Notes (Signed)
Patient reports that his headache is no better than when he was first admitted. He is still complaining of a "funny feeling" in his head and his neck is still bothersome.  Patient's BP has been taken several times this AM.  Patient has been taking the Oxy, but refuses tylenol and dilaudid.

## 2012-08-21 ENCOUNTER — Encounter (HOSPITAL_COMMUNITY): Payer: Self-pay | Admitting: *Deleted

## 2012-08-21 ENCOUNTER — Observation Stay (HOSPITAL_COMMUNITY)
Admission: EM | Admit: 2012-08-21 | Discharge: 2012-08-24 | Disposition: A | Discharge status: Home / Self Care | Payer: Self-pay | Attending: Internal Medicine | Admitting: Internal Medicine

## 2012-08-21 DIAGNOSIS — I498 Other specified cardiac arrhythmias: Secondary | ICD-10-CM | POA: Insufficient documentation

## 2012-08-21 DIAGNOSIS — F1411 Cocaine abuse, in remission: Secondary | ICD-10-CM | POA: Diagnosis present

## 2012-08-21 DIAGNOSIS — F1410 Cocaine abuse, uncomplicated: Secondary | ICD-10-CM | POA: Insufficient documentation

## 2012-08-21 DIAGNOSIS — F1910 Other psychoactive substance abuse, uncomplicated: Secondary | ICD-10-CM

## 2012-08-21 DIAGNOSIS — T465X1A Poisoning by other antihypertensive drugs, accidental (unintentional), initial encounter: Principal | ICD-10-CM | POA: Insufficient documentation

## 2012-08-21 DIAGNOSIS — R4182 Altered mental status, unspecified: Secondary | ICD-10-CM | POA: Insufficient documentation

## 2012-08-21 DIAGNOSIS — E876 Hypokalemia: Secondary | ICD-10-CM | POA: Diagnosis present

## 2012-08-21 DIAGNOSIS — I129 Hypertensive chronic kidney disease with stage 1 through stage 4 chronic kidney disease, or unspecified chronic kidney disease: Secondary | ICD-10-CM | POA: Insufficient documentation

## 2012-08-21 DIAGNOSIS — T46901A Poisoning by unspecified agents primarily affecting the cardiovascular system, accidental (unintentional), initial encounter: Secondary | ICD-10-CM | POA: Insufficient documentation

## 2012-08-21 DIAGNOSIS — N183 Chronic kidney disease, stage 3 unspecified: Secondary | ICD-10-CM | POA: Insufficient documentation

## 2012-08-21 DIAGNOSIS — Z23 Encounter for immunization: Secondary | ICD-10-CM | POA: Insufficient documentation

## 2012-08-21 DIAGNOSIS — F319 Bipolar disorder, unspecified: Secondary | ICD-10-CM | POA: Insufficient documentation

## 2012-08-21 DIAGNOSIS — N289 Disorder of kidney and ureter, unspecified: Secondary | ICD-10-CM

## 2012-08-21 DIAGNOSIS — F3131 Bipolar disorder, current episode depressed, mild: Secondary | ICD-10-CM

## 2012-08-21 LAB — ACETAMINOPHEN LEVEL: Acetaminophen (Tylenol), Serum: <15 ug/mL (ref 10–30)

## 2012-08-21 LAB — CBC
HCT: 36.2 % — ABNORMAL LOW (ref 39.0–52.0)
Hemoglobin: 12.3 g/dL — ABNORMAL LOW (ref 13.0–17.0)
WBC: 7.6 10*3/uL (ref 4.0–10.5)

## 2012-08-21 LAB — COMPREHENSIVE METABOLIC PANEL
Alkaline Phosphatase: 53 U/L (ref 39–117)
BUN: 15 mg/dL (ref 6–23)
Chloride: 104 mEq/L (ref 96–112)
GFR calc Af Amer: 48 mL/min — ABNORMAL LOW (ref >90)
Glucose, Bld: 125 mg/dL — ABNORMAL HIGH (ref 70–99)
Potassium: 3.4 mEq/L — ABNORMAL LOW (ref 3.5–5.1)
Total Bilirubin: 0.6 mg/dL (ref 0.3–1.2)

## 2012-08-21 LAB — RAPID URINE DRUG SCREEN, HOSP PERFORMED
Amphetamines: NOT DETECTED
Benzodiazepines: NOT DETECTED
Cocaine: POSITIVE — AB
Opiates: NOT DETECTED
Tetrahydrocannabinol: POSITIVE — AB

## 2012-08-21 LAB — ETHANOL: Alcohol, Ethyl (B): <11 mg/dL (ref 0–11)

## 2012-08-21 MED ORDER — SODIUM CHLORIDE 0.9 % IJ SOLN
3.0000 mL | Freq: Two times a day (BID) | INTRAMUSCULAR | Status: DC
  Administered 2012-08-21 – 2012-08-23 (×5): 3 mL via INTRAVENOUS

## 2012-08-21 MED ORDER — ACETAMINOPHEN 325 MG PO TABS
650.0000 mg | ORAL_TABLET | Freq: Four times a day (QID) | ORAL | Status: DC | PRN
  Administered 2012-08-22 – 2012-08-24 (×2): 650 mg via ORAL
  Filled 2012-08-21 (×2): qty 2

## 2012-08-21 MED ORDER — SODIUM CHLORIDE 0.9 % IV SOLN
INTRAVENOUS | Status: AC
  Administered 2012-08-21: 23:00:00 via INTRAVENOUS

## 2012-08-21 MED ORDER — ACETAMINOPHEN 650 MG RE SUPP: 650.0000 mg | Freq: Four times a day (QID) | RECTAL | Status: DC | PRN

## 2012-08-21 MED ORDER — SODIUM CHLORIDE 0.9 % IV BOLUS (SEPSIS)
1000.0000 mL | Freq: Once | INTRAVENOUS | Status: AC
  Administered 2012-08-21: 1000 mL via INTRAVENOUS

## 2012-08-21 MED ORDER — POTASSIUM CHLORIDE CRYS ER 20 MEQ PO TBCR
40.0000 meq | EXTENDED_RELEASE_TABLET | Freq: Three times a day (TID) | ORAL | Status: AC
  Administered 2012-08-21 – 2012-08-22 (×3): 40 meq via ORAL
  Filled 2012-08-21 (×3): qty 2

## 2012-08-21 MED ORDER — PNEUMOCOCCAL VAC POLYVALENT 25 MCG/0.5ML IJ INJ
0.5000 mL | INJECTION | Freq: Once | INTRAMUSCULAR | Status: AC
  Administered 2012-08-24: 0.5 mL via INTRAMUSCULAR
  Filled 2012-08-21 (×2): qty 0.5

## 2012-08-21 MED ORDER — ONDANSETRON HCL 4 MG/2ML IJ SOLN: 4.0000 mg | Freq: Four times a day (QID) | INTRAMUSCULAR | Status: DC | PRN

## 2012-08-21 MED ORDER — ONDANSETRON HCL 4 MG PO TABS: 4.0000 mg | ORAL_TABLET | Freq: Four times a day (QID) | ORAL | Status: DC | PRN

## 2012-08-21 NOTE — ED Notes (Signed)
Pt. States he does not have to use the restroom at his time.

## 2012-08-21 NOTE — ED Notes (Signed)
Pt having tele-psych consult at this time. NT at bedside

## 2012-08-21 NOTE — ED Notes (Signed)
Poison Control recommended the following: If he initially goes hypertensive DO NOT treat, unless completely dangerous.  If pt goes Hypotensive first treat with fluids. Then can give norepinephrine and/or dopamine. They don't recommend Narcan because doesn't work well in their experiences.   Recommend getting acetaminophen and aspirin level, EKG. They say try to use physical stimulation for arousal to keep patent airway, but intubate if needed.

## 2012-08-21 NOTE — ED Notes (Signed)
Dr. Eliane Decree informed of poison control's recommendations.

## 2012-08-21 NOTE — ED Notes (Signed)
Tried to call report to floor, was instructed to call back in 43mins.

## 2012-08-21 NOTE — H&P (Addendum)
[facility] History and Physical  Darryl Parsons [id number] DOB: 11-06-1956 DOA: 08/21/2012  Referring physician: ER physician PCP: No primary provider on file.   Chief Complaint: altered mental status  HPI:  56 year old male with history of HTN, polysubstance abuse, bipolar disorder who presented to ED with altered mental status. History mainly provided by ED physician as patient is very poor historian. Per ED physician, patient has ingested total of 10 clonidine pills but was unsure why or at least doesn't recall if he wanted to hurt himself. He apparently does have a history of visits to mental health center. Patient is able to report no chest pain, no shortness of breath, no palpitations. No abdominal pain, no nausea or vomiting. No lightheadedness or dizziness or loss of consciousness.  Assessment and Plan:  Active Problems:  Altered mental status - likely due to substance abuse, cocaine, clonidine - will follow up on UDS and ethanol level - will get psych consult in am for evaluation of patient's bipolar disorder - repeat EKG, there were questionable T wave inversions and ectopic beats but patient asymptomatic  - will repeat admission EKG 12 lead   Hypokalemia - repleted in ED - will follow up BMP in am   Chronic kidney disease (CKD), stage III (moderate) - creatinine in past above 2 so this admission it is better than baseline - continue IV fluids - follow up renal function in am  Leisa Lenz [facility] Pager: [phone]  Review of Systems:  Constitutional: Negative for fever, chills and malaise/fatigue. Negative for diaphoresis.  HENT: Negative for hearing loss, ear pain, nosebleeds, congestion, sore throat, neck pain, tinnitus and ear discharge.   Eyes: Negative for blurred vision, double vision, photophobia, pain, discharge and redness.  Respiratory: Negative for cough, hemoptysis, sputum production, shortness of breath, wheezing and stridor.   Cardiovascular:  Negative for chest pain, palpitations, orthopnea, claudication and leg swelling.  Gastrointestinal: Negative for nausea, vomiting and abdominal pain. Negative for heartburn, constipation, blood in stool and melena.  Genitourinary: Negative for dysuria, urgency, frequency, hematuria and flank pain.  Musculoskeletal: Negative for myalgias, back pain, joint pain and falls.  Skin: Negative for itching and rash.  Neurological: Negative for dizziness and weakness. Negative for tingling, tremors, sensory change, speech change, focal weakness, loss of consciousness and headaches.  Endo/Heme/Allergies: Negative for environmental allergies and polydipsia. Does not bruise/bleed easily.  Psychiatric/Behavioral: Negative for suicidal ideas. The patient is not nervous/anxious.      Past Medical History  Diagnosis Date  . Hypertension   . Low back pain   . Chronic kidney disease   . Mental disorder    History reviewed. No pertinent past surgical history. Social History:  reports that he has been smoking Cigarettes.  He has been smoking about .5 packs per day. He has never used smokeless tobacco. He reports that he uses illicit drugs (Cocaine). He reports that he does not drink alcohol.  Allergies  Allergen Reactions  . Other     Pt states he is allergic to a blood pressure pill. He is unable to state reaction. He is spelling it adelatt and aderlatt.    Family History: HTN in mother  Prior to Admission medications   Medication Sig Start Date End Date Taking? Authorizing Provider  cloNIDine (CATAPRES) 0.3 MG tablet Take 1 tablet (0.3 mg total) by mouth 2 (two) times daily. 10/04/11  Yes Ernest Haber, MD   Physical Exam: Filed Vitals:   08/21/12 1545 08/21/12 1615 08/21/12 1630  08/21/12 1636  BP:   166/92 166/92  Pulse: 48 45 47 53  Temp:    97.4 F (36.3 C)  TempSrc:    Oral  Resp: 11 11 12 21   Height:      Weight:      SpO2: 99% 98% 97% 100%    Physical Exam  Constitutional: Appears  well-developed and well-nourished. No distress.  HENT: Normocephalic. External right and left ear normal. Oropharynx is clear and moist.  Eyes: Conjunctivae and EOM are normal. PERRLA, no scleral icterus.  Neck: Normal ROM. Neck supple. No JVD. No tracheal deviation. No thyromegaly.  CVS: RRR, S1/S2 +, no murmurs, no gallops, no carotid bruit.  Pulmonary: Effort and breath sounds normal, no stridor, rhonchi, wheezes, rales.  Abdominal: Soft. BS +,  no distension, tenderness, rebound or guarding.  Musculoskeletal: Normal range of motion. No edema and no tenderness.  Lymphadenopathy: No lymphadenopathy noted, cervical, inguinal. Neuro: Alert. Normal reflexes, muscle tone coordination. No cranial nerve deficit. Skin: Skin is warm and dry. No rash noted. Not diaphoretic. No erythema. No pallor.  Psychiatric: Normal mood and affect. Behavior, judgment, thought content normal.   Labs on Admission:  Basic Metabolic Panel:  Lab [id number] 0658  NA 138  K 3.4*  CL 104  CO2 24  GLUCOSE 125*  BUN 15  CREATININE 1.80*  CALCIUM 9.1  MG 1.9  PHOS --   Liver Function Tests:  Lab 08/21/12 0658  AST 28  ALT 13  ALKPHOS 53  BILITOT 0.6  PROT 6.9  ALBUMIN 3.7   No results found for this basename: LIPASE:5,AMYLASE:5 in the last 168 hours No results found for this basename: AMMONIA:5 in the last 168 hours CBC:  Lab 08/21/12 0658  WBC 7.6  NEUTROABS --  HGB 12.3*  HCT 36.2*  MCV 83.4  PLT 211   Cardiac Enzymes: No results found for this basename: CKTOTAL:5,CKMB:5,CKMBINDEX:5,TROPONINI:5 in the last 168 hours BNP: No components found with this basename: POCBNP:5 CBG: No results found for this basename: GLUCAP:5 in the last 168 hours  Radiological Exams on Admission: No results found.   Code Status: Full Family Communication: Pt at bedside Disposition Plan: Admit for further evaluation; observation, telemetry  Leisa Lenz, MD  [facility] Pager [phone]  If 7PM-7AM, please contact  night-coverage [email or web address] Password [email or web address] 08/21/2012, 6:04 PM

## 2012-08-21 NOTE — Progress Notes (Signed)
Patient's heart rate down to 34 per telemetry monitor. Patient found to be in bed resting, asymptomatic otherwise.

## 2012-08-21 NOTE — Progress Notes (Signed)
[facility] ED Cm noted no pcp listed for pt CM confirmed with pt that he is self pay pt who was being seen by Dr Leward Quan at [facility] He has not attempted to obtain a new provider, his medical records nor any concerns with pharmacy services since [facility] facility closing.  CM reviewed with pt recommendations from [facility] staff after closing and provided written information for [facility] self pay providers, medication and financial resources.  Pt verbalized understanding and appreciation of services and resources rendered

## 2012-08-21 NOTE — Progress Notes (Signed)
[facility] on call made aware of patients vital signs. We will continue to monitor for now.

## 2012-08-21 NOTE — ED Notes (Signed)
Pt. Is not able to use the restroom still and there is a urinal still at the bedside.

## 2012-08-21 NOTE — ED Notes (Signed)
Per ems pt states he took the pills to help with his anxiety; denies suicidal ideations;

## 2012-08-21 NOTE — ED Notes (Signed)
Pt. Is not able to use the restroom at this time, but he still aware that we need urine.

## 2012-08-21 NOTE — ED Notes (Signed)
Pt lethargic on arrival; will awaken to voice; states took medicines at 515--when asked he states he just wanted to relax.  When questioned again he states he took 10 clonidine because he was depressed; tel nsr rate 62; Dr Tomi Bamberger in with patient at this time

## 2012-08-21 NOTE — ED Notes (Signed)
Poison control notified of pt stating he took 10 total clonidine--dose unknown; pt states took pills at 515am;

## 2012-08-21 NOTE — ED Notes (Signed)
Poison Control called to follow up on pt status.  They stressed again not to treat the hypertension.

## 2012-08-21 NOTE — ED Notes (Signed)
Pt changed into blue scrubs. Pt wanded by security. Two belonging bags at nurses station.

## 2012-08-21 NOTE — ED Provider Notes (Signed)
History     CSN: [id number]  Arrival date & time 08/21/12  [id number]   First MD Initiated Contact with Patient 08/21/12 [id number]      Chief Complaint  Patient presents with  . Drug Overdose   Level V caveat for altered mental status  (Consider location/radiation/quality/duration/timing/severity/associated sxs/prior treatment) HPI Pt reports he did crack last night about 7:30 pm and about 5 am today he took clonidine x 5 pills then about 10 mintues later took another 5 pills.  States he has "general pressure". When patient asked why he took the overdose he does not make sense, he states "I just the pills". He also indicates he drank milk because he thought that would counteract the effect of the pills. Patient states he has tried to hurt himself in the past by doing "during things" but cannot tell me what that was. He denies any prior psychiatric admission but states he used to go to the mental health center. He states they did recommend he take medication however he's not sure of his diagnosis. He also states he does not take the medication. Patient history hard to attain, patient's very sleepy and he also says a lot of things that I am not unable to understand his speech.  PCP  None   Past Medical History  Diagnosis Date  . Hypertension   . Low back pain   . Chronic kidney disease   . Mental disorder     History reviewed. No pertinent past surgical history.  Family History  Problem Relation Age of Onset  . Cardiomyopathy Sister   . Factor VIII deficiency Neg Hx   . Lupus Sister   . Kidney failure Sister     History  Substance Use Topics  . Smoking status: Current Every Day Smoker -- 0.5 packs/day    Types: Cigarettes  . Smokeless tobacco: Not on file  . Alcohol Use: No   Unemployed + crack   Review of Systems  All other systems reviewed and are negative.    Allergies  Review of patient's allergies indicates no known allergies.  Home Medications   Current  Outpatient Rx  Name Route Sig Dispense Refill  . CLONIDINE HCL 0.3 MG PO TABS Oral Take 1 tablet (0.3 mg total) by mouth 2 (two) times daily. 6 tablet 0  . ENALAPRIL-HYDROCHLOROTHIAZIDE 10-25 MG PO TABS Oral Take 1 tablet by mouth daily. 3 tablet 0  . INDOMETHACIN 25 MG PO CAPS Oral Take 75 mg by mouth daily.      [name] KETOCONAZOLE 200 MG PO TABS Oral Take 200 mg by mouth daily. As needed for itching hands      . QUETIAPINE FUMARATE 50 MG PO TABS Oral Take 1 tablet (50 mg total) by mouth at bedtime. 3 tablet 0  . VITAMIN D (ERGOCALCIFEROL) 50000 UNITS PO CAPS Oral Take 50,000 Units by mouth every 7 (seven) days.      Patient states the only medication he has at home is clonidine    BP 121/85  Pulse 61  Temp 98.2 F (36.8 C) (Oral)  Resp 16  Ht 5\' 8"  (1.727 m)  Wt 178 lb (80.74 kg)  BMI 27.06 kg/m2  SpO2 96%  Vital signs normal    Physical Exam  Nursing note and vitals reviewed. Constitutional: He appears well-developed and well-nourished.  Non-toxic appearance. He does not appear ill. No distress.  HENT:  Head: Normocephalic and atraumatic.  Right Ear: External ear normal.  Left Ear: External ear normal.  Nose: Nose normal. No mucosal edema or rhinorrhea.  Mouth/Throat: Oropharynx is clear and moist and mucous membranes are normal. No dental abscesses or uvula swelling.  Eyes: Conjunctivae normal and EOM are normal. Pupils are equal, round, and reactive to light.  Neck: Normal range of motion and full passive range of motion without pain. Neck supple.  Cardiovascular: Regular rhythm and normal heart sounds.  Bradycardia present.  Exam reveals no gallop and no friction rub.   No murmur heard.      HR 53 during my exam  Pulmonary/Chest: Effort normal and breath sounds normal. No respiratory distress. He has no wheezes. He has no rhonchi. He has no rales. He exhibits no tenderness and no crepitus.  Abdominal: Soft. Normal appearance and bowel sounds are normal. He exhibits no  distension. There is no tenderness. There is no rebound and no guarding.  Musculoskeletal: Normal range of motion. He exhibits no edema and no tenderness.       Moves all extremities well.   Neurological: He has normal strength. No cranial nerve deficit.       Lethargic, but arousable  Skin: Skin is warm, dry and intact. No rash noted. No erythema. No pallor.  Psychiatric: His speech is normal. His mood appears not anxious.       Flat affect    ED Course  Procedures (including critical care time)  Nurses have talked to poison control who stated initially he could have hypertension from an overdose of clonidine and then developed hypotension. They state treatment is fluids initially and 4 uncontrolled hypertension norepinephrine and dopamine. They do not recommend Narcan.  09:15 patient sleeping, however he is easier to wake up although he does not wake up right away. His speech is getting a little bit easier to understand. He denies feeling dizzy. His current heart rate is 45 with blood pressure 135/91. When asked why she took the pills he states he was trying to get relaxation, he denies suicidal ideation. When patient is a little bit more alert we'll do tele-psych consult.  12:40 Pt is sitting up in bed eating with family member. He is alert and able to hold a conversation now. Patient states he feels fine. Patient's heart rate is 51, blood pressure 124/89. We'll get tele-psych consult. It appears the patient just has poor judgment and taking the overdose.  15:56 Poison Control since the prolonged QTIc has developed may be more of a seroquel OD, replace K and Mag, only give bicarb if has prolonged QRS interval and repeat EKG in 4-6 hours. If seroquel OD at risk for seizures, treat for seizures.   16:00 Pt denies having any seroquel, states the only thing he has at home is clonidine.   16:51 Dr Charlies Silvers, have cardiology review EKG, will admit  17:00 Dr Winfred Leeds will have cardiology review  the EKG's for Dr Charlies Silvers.  Results for orders placed during the hospital encounter of 08/21/12  ACETAMINOPHEN LEVEL      Component Value Range   Acetaminophen (Tylenol), Serum <15.0  10 - 30 ug/mL  CBC      Component Value Range   WBC 7.6  4.0 - 10.5 K/uL   RBC 4.34  4.22 - 5.81 MIL/uL   Hemoglobin 12.3 (*) 13.0 - 17.0 g/dL   HCT 36.2 (*) 39.0 - 52.0 %   MCV 83.4  78.0 - 100.0 fL   MCH 28.3  26.0 - 34.0 pg   MCHC 34.0  30.0 - 36.0 g/dL   RDW  13.9  11.5 - 15.5 %   Platelets 211  150 - 400 K/uL  COMPREHENSIVE METABOLIC PANEL      Component Value Range   Sodium 138  135 - 145 mEq/L   Potassium 3.4 (*) 3.5 - 5.1 mEq/L   Chloride 104  96 - 112 mEq/L   CO2 24  19 - 32 mEq/L   Glucose, Bld 125 (*) 70 - 99 mg/dL   BUN 15  6 - 23 mg/dL   Creatinine, Ser 1.80 (*) 0.50 - 1.35 mg/dL   Calcium 9.1  8.4 - 10.5 mg/dL   Total Protein 6.9  6.0 - 8.3 g/dL   Albumin 3.7  3.5 - 5.2 g/dL   AST 28  0 - 37 U/L   ALT 13  0 - 53 U/L   Alkaline Phosphatase 53  39 - 117 U/L   Total Bilirubin 0.6  0.3 - 1.2 mg/dL   GFR calc non Af Amer 41 (*) >90 mL/min   GFR calc Af Amer 48 (*) >90 mL/min  ETHANOL      Component Value Range   Alcohol, Ethyl (B) <11  0 - 11 mg/dL  SALICYLATE LEVEL      Component Value Range   Salicylate Lvl 123456 (*) 2.8 - 20.0 mg/dL   Laboratory interpretation all normal except renal insuffic, mild hypokalemia       Date: 08/21/2012  Rate: 54  Rhythm: sinus bradycardia  QRS Axis: normal  Intervals: normal  ST/T Wave abnormalities:early repolarization  Conduction Disutrbances:none  Narrative Interpretation:   Old EKG Reviewed: unchanged from 10/03/2011 HR was 49  #2   Date: 08/21/2012  Rate: 46  Rhythm: sinus bradycardia ectopic p waves.  QRS Axis: normal  Intervals: QT prolonged  ST/T Wave abnormalities: normal  Conduction Disutrbances:none  Narrative Interpretation:   Old EKG Reviewed: changes noted earlier today     1. Hypokalemia   2. Renal  insufficiency   3. Drug abuse   4. Altered mental status    Plan admission   Rolland Porter, MD, [place], MD 08/21/12 [place], MD 08/21/12 [id number]

## 2012-08-21 NOTE — ED Notes (Signed)
Per ems pt girlfriend called ems stating pt had taken clonidine at 515; pt states he took them to relax; pt lethargic on arrival; used crack cocaine at 8pm

## 2012-08-22 DIAGNOSIS — E876 Hypokalemia: Secondary | ICD-10-CM

## 2012-08-22 DIAGNOSIS — N183 Chronic kidney disease, stage 3 (moderate): Secondary | ICD-10-CM

## 2012-08-22 LAB — COMPREHENSIVE METABOLIC PANEL
ALT: 12 U/L (ref 0–53)
CO2: 23 mEq/L (ref 19–32)
Calcium: 8.9 mg/dL (ref 8.4–10.5)
GFR calc Af Amer: 51 mL/min — ABNORMAL LOW (ref >90)
GFR calc non Af Amer: 44 mL/min — ABNORMAL LOW (ref >90)
Glucose, Bld: 95 mg/dL (ref 70–99)
Sodium: 139 mEq/L (ref 135–145)

## 2012-08-22 LAB — CBC
Hemoglobin: 11.7 g/dL — ABNORMAL LOW (ref 13.0–17.0)
MCHC: 33.4 g/dL (ref 30.0–36.0)
Platelets: 186 10*3/uL (ref 150–400)
RDW: 14.1 % (ref 11.5–15.5)

## 2012-08-22 LAB — GLUCOSE, CAPILLARY: Glucose-Capillary: 149 mg/dL — ABNORMAL HIGH (ref 70–99)

## 2012-08-22 MED ORDER — TRAMADOL HCL 50 MG PO TABS
50.0000 mg | ORAL_TABLET | Freq: Four times a day (QID) | ORAL | Status: AC | PRN
  Administered 2012-08-22 – 2012-08-23 (×2): 50 mg via ORAL
  Filled 2012-08-22 (×2): qty 1

## 2012-08-22 MED ORDER — HYDRALAZINE HCL 25 MG PO TABS
25.0000 mg | ORAL_TABLET | Freq: Three times a day (TID) | ORAL | Status: DC
  Administered 2012-08-22 – 2012-08-23 (×5): 25 mg via ORAL
  Filled 2012-08-22 (×10): qty 1

## 2012-08-22 MED ORDER — DIPHENHYDRAMINE HCL 25 MG PO CAPS
25.0000 mg | ORAL_CAPSULE | Freq: Once | ORAL | Status: AC
  Administered 2012-08-22: 25 mg via ORAL
  Filled 2012-08-22: qty 1

## 2012-08-22 NOTE — Progress Notes (Signed)
Subjective: Patient awake and alert. He states that he took the clonidine because he wanted to relax was feeling anxious. The patient has a very unusual affect and states that he has been under the care of a psychiatrist at [facility] but has not been back to see the psychiatrist since being incarcerated in March onto July. The patient states that he was not suicidal and had no intentions of killing himself. 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 However he adamantly refutes a diagnosis of bipolar disorder Objective: Filed Vitals:   08/21/12 2220 08/22/12 0630 08/22/12 1700 08/22/12 1744  BP: 175/88 158/99 175/113 156/93  Pulse: 50 54  70  Temp: 97.5 F (36.4 C) 98 F (36.7 C) 97.5 F (36.4 C)   TempSrc: Oral Oral Oral   Resp: 18 18 16    Height:      Weight:      SpO2: 100% 98% 98% 100%   Weight change:   Intake/Output Summary (Last 24 hours) at 08/22/12 1942 Last data filed at 08/22/12 0957  Gross per 24 hour  Intake 1996.25 ml  Output      0 ml  Net 1996.25 ml    General: Alert, awake, oriented x3, in no acute distress.  HEENT: [place]/AT PEERL, EOMI Neck: Trachea midline,  no masses, no thyromegal,y no JVD, no carotid bruit OROPHARYNX:  Moist, No exudate/ erythema/lesions.  Heart: Regular rate and rhythm. Normal sinus rhythm on telemetry Lungs: Clear to auscultation. Abdomen: Soft, nontender, nondistended, positive bowel sounds, no masses no hepatosplenomegaly noted.  Neuro: No focal neurological deficits noted. Strength 5 out of 5 in bilateral upper and lower extremities. Musculoskeletal: No warm swelling or erythema around joints, no spinal tenderness noted. Psychiatric: Patient alert and oriented x3, good insight and cognition, good recent to remote recall.    Lab Results:  [facility] 08/22/12 0439 08/21/12 0658  NA 139 138  K 3.9 3.4*  CL 109 104  CO2 23 24  GLUCOSE 95 125*  BUN 13 15    CREATININE 1.71* 1.80*  CALCIUM 8.9 9.1  MG -- 1.9  PHOS -- --    Basename 08/22/12 0439 08/21/12 0658  AST 21 28  ALT 12 13  ALKPHOS 50 53  BILITOT 0.4 0.6  PROT 6.2 6.9  ALBUMIN 3.2* 3.7   No results found for this basename: LIPASE:2,AMYLASE:2 in the last 72 hours  Basename 08/22/12 0439 08/21/12 0658  WBC 6.2 7.6  NEUTROABS -- --  HGB 11.7* 12.3*  HCT 35.0* 36.2*  MCV 82.9 83.4  PLT 186 211   No results found for this basename: CKTOTAL:3,CKMB:3,CKMBINDEX:3,TROPONINI:3 in the last 72 hours No components found with this basename: POCBNP:3 No results found for this basename: DDIMER:2 in the last 72 hours No results found for this basename: HGBA1C:2 in the last 72 hours No results found for this basename: CHOL:2,HDL:2,LDLCALC:2,TRIG:2,CHOLHDL:2,LDLDIRECT:2 in the last 72 hours No results found for this basename: TSH,T4TOTAL,FREET3,T3FREE,THYROIDAB in the last 72 hours No results found for this basename: VITAMINB12:2,FOLATE:2,FERRITIN:2,TIBC:2,IRON:2,RETICCTPCT:2 in the last 72 hours  Micro Results: No results found for this or any previous visit (from the past 240 hour(s)).  Studies/Results: No results found.  Medications: I have reviewed the patient's current medications. Scheduled Meds:   . hydrALAZINE  25 mg Oral Q8H  . pneumococcal 23 valent vaccine  0.5 mL Intramuscular Once  . potassium chloride  40 mEq Oral TID  . sodium chloride  3 mL Intravenous Q12H  Continuous Infusions:   . sodium chloride 75 mL/hr at 08/21/12 2302   PRN Meds:.acetaminophen, acetaminophen, ondansetron (ZOFRAN) IV, ondansetron, traMADol Assessment/Plan: Patient Active Hospital Problem List: Clonidine overdose (08/21/2012)   Assessment: The patient states he took 10 tablets of 0.3 clonidine. Upon arrival he was bradycardic however his heart rate is now within normal limits. At this time I continue to hold clonidine and we'll observe him further. If he becomes tachycardic then it would  likely be prudent to put him on low dose of clonidine as he is likely to have rebound tachycardia.   Chronic kidney disease (CKD), stage III (moderate) (10/02/2011)   Assessment: Creatinine stable     Bipolar 1 disorder, depressed, mild (10/02/2011)   Assessment: Patient currently none of the care of psychiatry. I've asked Dr. Evern Bio to see the patient in consultation and offer recommendations.     Cocaine abuse (08/21/2012)   Assessment: Patient states that he's not addicted but he only uses crack recreationally as he does with marijuana.    Plan: Patient in pre-contemplative state at this time.  Altered mental status (08/21/2012)   Assessment: Now resolved     Hypokalemia (08/21/2012)   Assessment: Replaced   Hypertension (08/21/2012)   Assessment: Currently the patient is on hydralazine for management of his blood pressures.   Disposition: I suspect that the patient will be able to be discharged within the next 24-48 hours dependent upon his heart rate. The only issue that I can see preventing discharge would be a rebound tachycardia and the need to restart some AV nodal blockade appear   LOS: 1 day

## 2012-08-22 NOTE — Progress Notes (Signed)
Poison control called for update on patient. Will call back in the morning.

## 2012-08-22 NOTE — Progress Notes (Addendum)
Patient continues to bradycardic with what could be pauses at times but is resting quietly and asymptomatic. Poison control called to check the status of patient.

## 2012-08-22 NOTE — Progress Notes (Signed)
Pt c/o left knee pain and states that tylenol will not help.  Text page sent to Dr on call.

## 2012-08-23 DIAGNOSIS — F3131 Bipolar disorder, current episode depressed, mild: Secondary | ICD-10-CM

## 2012-08-23 LAB — GLUCOSE, CAPILLARY: Glucose-Capillary: 81 mg/dL (ref 70–99)

## 2012-08-23 MED ORDER — CLONIDINE HCL 0.2 MG PO TABS
0.2000 mg | ORAL_TABLET | Freq: Once | ORAL | Status: AC
  Administered 2012-08-23: 0.2 mg via ORAL
  Filled 2012-08-23: qty 1

## 2012-08-23 MED ORDER — CLONIDINE HCL 0.2 MG PO TABS
0.2000 mg | ORAL_TABLET | Freq: Two times a day (BID) | ORAL | Status: DC
  Administered 2012-08-23 – 2012-08-24 (×2): 0.2 mg via ORAL
  Filled 2012-08-23 (×3): qty 1

## 2012-08-23 MED ORDER — DIPHENHYDRAMINE HCL 25 MG PO CAPS
25.0000 mg | ORAL_CAPSULE | Freq: Four times a day (QID) | ORAL | Status: DC | PRN
  Administered 2012-08-23 (×2): 25 mg via ORAL
  Filled 2012-08-23 (×2): qty 1

## 2012-08-23 NOTE — Consult Note (Signed)
Consult Note Patient Identification:  Darryl Parsons Date of Evaluation:  08/23/2012 Reason for Consult: Bipolar disorder, overdose clonidine, cocaine  Referring Provider:  Dr. Zigmund Daniel   History of Present Illness:Pt presented to ED with c/o overdose with 10 pills of Clonidine.  He said hse wanted to relax c/o insomnia.     Past Psychiatric History:He says he only attempted suicide before: was in ICU after taking 40 tabs of Clonidine.  He has used crack cocaine from age [age] yo.  He says he uses about every other month.  He does not drink beer.  He uses 1/2 ppd ciagarettes.  He says he has attempted suicide about 4-5 times.; has cut his arms.   He goes to [facility] for medications but does not name his medications     Past Medical History:     Past Medical History  Diagnosis Date  . Hypertension   . Low back pain   . Chronic kidney disease   . Mental disorder       History reviewed. No pertinent past surgical history.  Allergies:  Allergies  Allergen Reactions  . Other     Pt states he is allergic to a blood pressure pill. He is unable to state reaction. He is spelling it adelatt and aderlatt.    Current Medications:  Prior to Admission medications   Medication Sig Start Date End Date Taking? Authorizing Provider  cloNIDine (CATAPRES) 0.3 MG tablet Take 1 tablet (0.3 mg total) by mouth 2 (two) times daily. 10/04/11  Yes Ernest Haber, MD    Social History:    reports that he has been smoking Cigarettes.  He has been smoking about .5 packs per day. He has never used smokeless tobacco. He reports that he uses illicit drugs (Cocaine). He reports that he does not drink alcohol.   Family History:    Family History  Problem Relation Age of Onset  . Cardiomyopathy Sister   . Factor VIII deficiency Neg Hx   . Lupus Sister   . Kidney failure Sister     Mental Status Examination/Evaluation: Objective:  Appearance: Neat  Psychomotor Activity:  Normal  Eye Contact::  Fair    Speech:  Clear and Coherent  Volume:  Normal  Mood:  Depressed and Dysphoric  Affect:  Blunt and Depressed  Thought Process:  Coherent and Relevant  Orientation:  Full  Thought Content:  Focused on his creative efforts; copyright several songs asking for benzodiazepines  Suicidal Thoughts:  No  Homicidal Thoughts:  No  Judgement:  Fair  Insight:  Fair    DIAGNOSIS:   AXIS I   Polysubstance abuse, Bipolar Disorder depressed most recent Nicotine depedence,   AXIS II  Deffered  AXIS III See medical notes.  AXIS IV economic problems, other psychosocial or environmental problems, problems related to legal system/crime, problems related to social environment and no active employment history   AXIS V 51-60 moderate symptoms   Assessment/Plan:  Discussed with Psych CSW Pt is sitting up in bed with GF of 55 years:  [name], obese in electric chair.  He grants permission for her to be present.  He says he and sibling grew up  He quit school and late earned a GED.  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  He has no job currently and wants to apply for disability.   He spends hours at the keyboard composing.  He says he has composed 5 songs with copywrite.  He also  likes to write.  He makes several requests for Klonopin as does his girlfriend.  he is very calm, does not define his depressed mood, reasons for excessive doses or his frequency of visits to [facility].  In this respect he is guarded.  He does not respond to inquiries about mood swings; but implies he may write music "for days".  He is coherent, cooperative with a guarded  Affect. He indicates his main goal is to receive 'disability''    RECOMMENDATION:  1. Pt is coherent but profeses to use medications excessively of recommended doses.This is apparently volitional 2. He needs to be referred to an outpatient psychiatrist - [facility] for a full evaluation to substantiate symptoms and mental status examination.to corroborate his  symptoms or r/o self/procalimed diagnoses 3.  Pt minimizes suicide attempt and reports he has made numerous attempts in the past; no definitive explanation or reference to hospital admission. 4.  Pt needs to be seen numerous times by a psychiatrist  to definitively determine 'disability'. 5.  Physician is requested to evaluate use of clonidine in view of his repeated overdoses. 6.  Pt desires to be discharged to home when medically stable.   7.  No other psychiatric needs unless requested.  MD Psychiatrist signs off.  Sholom Dulude J. Evern Bio, MD Psychiatrist 08/23/2012. 3:44 PM

## 2012-08-23 NOTE — Progress Notes (Signed)
Subjective: Patient awake and alert. He states that he took the clonidine because he wanted to relax was feeling anxious. The patient has a very unusual affect and states that he has been under the care of a psychiatrist at [facility] but has not been back to see the psychiatrist since being incarcerated in March onto July. The patient states that he was not suicidal and had no intentions of killing himself. 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 However he adamantly refutes a diagnosis of bipolar disorder  Today he wants something for anxiety, Dr Coralie Keens will see the patient today. Objective: Filed Vitals:   08/22/12 2245 08/22/12 2345 08/23/12 0600 08/23/12 1407  BP: 171/121 175/99 168/95 180/109  Pulse: 68  69 64  Temp: 98.2 F (36.8 C)  98.7 F (37.1 C) 98.1 F (36.7 C)  TempSrc: Oral  Oral Oral  Resp: 18 20 18 18   Height:      Weight:      SpO2: 98%  100% 100%   Weight change:   Intake/Output Summary (Last 24 hours) at 08/23/12 1440 Last data filed at 08/23/12 1407  Gross per 24 hour  Intake    360 ml  Output      0 ml  Net    360 ml    General: Alert, awake, oriented x3, in no acute distress.  HEENT: [place]/AT PEERL, EOMI Neck: Trachea midline,  no masses, no thyromegal,y no JVD, no carotid bruit OROPHARYNX:  Moist, No exudate/ erythema/lesions.  Heart: Regular rate and rhythm. Normal sinus rhythm on telemetry Lungs: Clear to auscultation. Abdomen: Soft, nontender, nondistended, positive bowel sounds, no masses no hepatosplenomegaly noted.  Neuro: No focal neurological deficits noted. Strength 5 out of 5 in bilateral upper and lower extremities. Musculoskeletal: No warm swelling or erythema around joints, no spinal tenderness noted. Psychiatric: Patient alert and oriented x3, good insight and cognition, good recent to remote recall.    Lab Results:  [facility] 08/22/12 0439 08/21/12 0658  NA  139 138  K 3.9 3.4*  CL 109 104  CO2 23 24  GLUCOSE 95 125*  BUN 13 15  CREATININE 1.71* 1.80*  CALCIUM 8.9 9.1  MG -- 1.9  PHOS -- --    Basename 08/22/12 0439 08/21/12 0658  AST 21 28  ALT 12 13  ALKPHOS 50 53  BILITOT 0.4 0.6  PROT 6.2 6.9  ALBUMIN 3.2* 3.7   No results found for this basename: LIPASE:2,AMYLASE:2 in the last 72 hours  Basename 08/22/12 0439 08/21/12 0658  WBC 6.2 7.6  NEUTROABS -- --  HGB 11.7* 12.3*  HCT 35.0* 36.2*  MCV 82.9 83.4  PLT 186 211   Micro Results: No results found for this or any previous visit (from the past 240 hour(s)).  Studies/Results: No results found.  Medications: I have reviewed the patient's current medications. Scheduled Meds:    . cloNIDine  0.2 mg Oral BID  . diphenhydrAMINE  25 mg Oral Once  . hydrALAZINE  25 mg Oral Q8H  . pneumococcal 23 valent vaccine  0.5 mL Intramuscular Once  . sodium chloride  3 mL Intravenous Q12H   Continuous Infusions:  PRN Meds:.acetaminophen, acetaminophen, diphenhydrAMINE, ondansetron (ZOFRAN) IV, ondansetron, traMADol  Assessment/Plan:  Clonidine overdose (08/21/2012)   Assessment: The patient states he took 10 tablets of 0.3 clonidine. Upon arrival he was bradycardic however his heart rate is now within normal limits. At this time I will start  him on Clonidine 0.2 mg po bid as his BP seems to be getting elevated.  Chronic kidney disease (CKD), stage III (moderate) (10/02/2011)   Assessment: Creatinine stable     Bipolar 1 disorder, depressed, mild (10/02/2011)   Assessment: Patient currently none of the care of psychiatry. Dr Evern Bio to see the patient today.    Cocaine abuse (08/21/2012)   Assessment: Patient states that he's not addicted but he only uses crack recreationally as he does with marijuana.    Plan: Patient in pre-contemplative state at this time.  Altered mental status (08/21/2012)   Assessment: Now resolved     Hypokalemia (08/21/2012)   Assessment: Replaced     Hypertension (08/21/2012)   Assessment: Will start him on clonidine 0.2 mg po BID as BP is now elevated.Also continue Hydralazine 25 mg PO TID. We will need to wean off from clonidine slowly.      LOS: 2 days

## 2012-08-23 NOTE — Progress Notes (Signed)
Pt's blood pressure was 179/113. Called Dr. Darrick Meigs to inform him of the high blood pressure and asked if we could give a dose of Clonidine. Dr. Darrick Meigs requested that we give the pt Clonidine (0.2 Mg Tablet).  Dillion Stowers M

## 2012-08-24 ENCOUNTER — Observation Stay (HOSPITAL_COMMUNITY): Payer: Self-pay

## 2012-08-24 LAB — GLUCOSE, CAPILLARY

## 2012-08-24 MED ORDER — HYDRALAZINE HCL 25 MG PO TABS: 25.0000 mg | ORAL_TABLET | Freq: Three times a day (TID) | ORAL | Status: DC

## 2012-08-24 MED ORDER — HYDROCODONE-ACETAMINOPHEN 5-500 MG PO TABS: 1.0000 | ORAL_TABLET | Freq: Four times a day (QID) | ORAL | Status: DC | PRN

## 2012-08-24 MED ORDER — CLONIDINE HCL 0.3 MG PO TABS: 0.3000 mg | ORAL_TABLET | Freq: Two times a day (BID) | ORAL | Status: DC

## 2012-08-24 MED ORDER — VITAMINS A & D EX OINT
TOPICAL_OINTMENT | CUTANEOUS | Status: AC
  Filled 2012-08-24: qty 5

## 2012-08-24 NOTE — Discharge Summary (Signed)
Physician Discharge Summary  RODMAN GRONLUND [id number] DOB: 1956-07-19 DOA: 08/21/2012  PCP: No primary provider on file.  Admit date: 08/21/2012 Discharge date: 08/24/2012  Recommendations for Outpatient Follow-up:  1. Pt will need to follow up with PCP in 2-3 weeks post discharge 2. Please obtain BMP to evaluate electrolytes and kidney function 3. Please also check CBC to evaluate Hg and Hct levels  Discharge Diagnoses: Chest pain, secondary to cocaine induced vasospasm Active Problems:  Chronic kidney disease (CKD), stage III (moderate)  Bipolar 1 disorder, depressed, mild  Cocaine abuse  Altered mental status  Hypokalemia   Discharge Condition: Stable  Diet recommendation: Heart healthy diet discussed in details   HPI:  56 year old male with history of HTN, polysubstance abuse, bipolar disorder who presented to ED with altered mental status. History mainly provided by ED physician as patient is very poor historian. Per ED physician, patient has ingested total of 10 clonidine pills but was unsure why or at least doesn't recall if he wanted to hurt himself. He apparently does have a history of visits to mental health center. Patient is able to report no chest pain, no shortness of breath, no palpitations. No abdominal pain, no nausea or vomiting. No lightheadedness or dizziness or loss of consciousness.   Assessment and Plan:  Active Problems:  Altered mental status  - likely due to substance abuse, cocaine, THC - with supportive care, IVF, and BP control this has resolved - repeat EKG, there were questionable T wave inversions and ectopic beats but patient asymptomatic  - pt stable and at baseline mental status upon discharge - substance abuse and cessation discussed over 1 hour prior to discharge  Hypokalemia  - repleted in ED  - supplemented and within normal limits upon discharge   Chronic kidney disease (CKD), stage III (moderate)  - creatinine in past above 2 so  this admission it is better than baseline  - continued IV fluids  - creatinine trending down and remains below actual baseline   Chest pain - secondary to cocaine use and cocaine induced vasospasm - with supportive care this has resolved - BP control discussed in detail  Accelerated HTN - secondary to medical non compliance  - pt has been refusing medications during the hospital stay - this was discussed in the detail - prescriptions provided upon discharge   Discharge Exam: Filed Vitals:   08/24/12 0701  BP: 180/120  Pulse:   Temp:   Resp:    Filed Vitals:   08/23/12 2329 08/23/12 2330 08/24/12 0655 08/24/12 0701  BP: 150/109 148/96 179/122 180/120  Pulse:   64   Temp:   97.9 F (36.6 C)   TempSrc:   Oral   Resp:   18   Height:      Weight:      SpO2:   99%     General: Pt is alert, follows commands appropriately, not in acute distress Cardiovascular: Regular rate and rhythm, S1/S2 +, no murmurs, no rubs, no gallops Respiratory: Clear to auscultation bilaterally, no wheezing, no crackles, no rhonchi Abdominal: Soft, non tender, non distended, bowel sounds +, no guarding Extremities: no edema, no cyanosis, pulses palpable bilaterally DP and PT Neuro: Grossly nonfocal  Discharge Instructions  Discharge Orders    Future Orders Please Complete By Expires   Diet - low sodium heart healthy      Increase activity slowly          Medication List     As  of 08/24/2012 10:23 AM    TAKE these medications         cloNIDine 0.3 MG tablet   Commonly known as: CATAPRES   Take 1 tablet (0.3 mg total) by mouth 2 (two) times daily.      hydrALAZINE 25 MG tablet   Commonly known as: APRESOLINE   Take 1 tablet (25 mg total) by mouth every 8 (eight) hours.      HYDROcodone-acetaminophen 5-500 MG per tablet   Commonly known as: VICODIN   Take 1 tablet by mouth every 6 (six) hours as needed for pain.           Follow-up Information    Follow up with primary  provider in 2 weeks.          The results of significant diagnostics from this hospitalization (including imaging, microbiology, ancillary and laboratory) are listed below for reference.     Microbiology: No results found for this or any previous visit (from the past 240 hour(s)).   Labs: Basic Metabolic Panel:  Lab 0000000 0439 08/21/12 0658  NA 139 138  K 3.9 3.4*  CL 109 104  CO2 23 24  GLUCOSE 95 125*  BUN 13 15  CREATININE 1.71* 1.80*  CALCIUM 8.9 9.1  MG -- 1.9  PHOS -- --   Liver Function Tests:  Lab 08/22/12 0439 08/21/12 0658  AST 21 28  ALT 12 13  ALKPHOS 50 53  BILITOT 0.4 0.6  PROT 6.2 6.9  ALBUMIN 3.2* 3.7   CBC:  Lab 08/22/12 0439 08/21/12 0658  WBC 6.2 7.6  NEUTROABS -- --  HGB 11.7* 12.3*  HCT 35.0* 36.2*  MCV 82.9 83.4  PLT 186 211   CBG:  Lab 08/24/12 0733 08/23/12 0904 08/22/12 0742  GLUCAP 146* 81 149*    SIGNED: Time coordinating discharge: Over 30 minutes  Faye Ramsay, MD  [facility] 08/24/2012, 10:23 AM Pager [phone]  If 7PM-7AM, please contact night-coverage [email or web address] Password [email or web address]

## 2012-08-24 NOTE — Progress Notes (Signed)
DC instructions gone over with patient.  Encouraged patient to take his Clonidine and Hydralazine as prescribed to control his blood pressure medications.  Patient says his Clonidine will control his BP when he is at home.  Patient given bus pass for transportation home.

## 2012-08-24 NOTE — Progress Notes (Signed)
Patient refusing to take Hydralazine in spite of his BP being significantly elevated.  He states he knows more than anyone here and he knows he needs more Clonidine.  I told him he took Clonidine and his BP stayed the same.  Patient continuously stating he knows more than the doctors or nurses here and he will not take the Hydralazine.  MD aware.

## 2012-08-24 NOTE — Plan of Care (Signed)
Problem: Discharge Progression Outcomes Goal: Hemodynamically stable Outcome: Adequate for Discharge BP continues to be elevated due to patients noncompliance with medication regimen

## 2012-08-24 NOTE — Progress Notes (Signed)
Clinical Social Work Department CLINICAL SOCIAL WORK PSYCHIATRY SERVICE LINE ASSESSMENT 08/24/2012  Patient:  Darryl Parsons  Account:  [id number]  Admit Date:  08/21/2012  Clinical Social Worker:  Levie Parsons  Date/Time:  08/24/2012 10:39 AM Referred by:  Physician  Date referred:  08/24/2012 Reason for Referral  Behavioral Health Issues   Presenting Symptoms/Problems (In the person's/family's own words):   Clonidine OD    Abuse/Neglect/Trauma Comments:   Psychiatric History (check all that apply)  Outpatient treatment   Psychiatric medications:  Seroquel   Current Mental Health Hospitalizations/Previous Mental Health History:   Current provider:   [facility]   Place and Date:   Current Medications:   See H&P   Previous Impatient Admission/Date/Reason:   Emotional Health / Current Symptoms    Suicide/Self Harm  Suicide attempt in past (date/description)   Suicide attempt in the past:   Other harmful behavior:   Psychotic/Dissociative Symptoms  None reported   Other Psychotic/Dissociative Symptoms:    Attention/Behavioral Symptoms  Within Normal Limits   Other Attention / Behavioral Symptoms:    Cognitive Impairment  Within Normal Limits   Other Cognitive Impairment:    Mood and Adjustment  Labile    Stress, Anxiety, Trauma, Any Recent Loss/Stressor  None reported   Anxiety (frequency):   Phobia (specify):   Compulsive behavior (specify):   Obsessive behavior (specify):   Other:   Pt reports frustration surrounding the medical professionals and not prescribing him Klonopin.    SBIRT completed (please refer for detailed history):  N  Self-reported substance use:   Urinary Drug Screen Completed:  Y Alcohol level:    Environmental/Housing/Living Arrangement  Stable housing   Who is in the home:   Girlfriend   Emergency contact:  Girlfriend   Financial  IPRS   Patient's Strengths and Goals (patient's own words):   Clinical  Social Worker's Interpretive Summary:   Pt initially told CSW that his name was Darryl Parsons and that CSW had the wrong Pt.  Pt stated that he wasn't interested in meeting with CSW.  CSW explained CSW's role and Pt became agreeable to talking with CSW.    Pt was difficult to understand and he was tangential while discussing his current situation.  Additionally, Pt's demeanor was hostile and he took issue with CSW explaining why [facility] doesn't, per policy, prescribe Klonopin.    Pt reports that he goes to [facility] for therapy and med mgmt but that he hasn't gotten his meds from them in some time. Pt expresses anger that [facility] will only give him Seroquel and not Klonopin.  He reported that he doesn't abuse this med and that he has access to it, and illegal drugs, on the street.  He states that he would prefer to get Klonopin by legitimate means but that he's forced to buy it off the street.    As Pt continued to convince CSW that he does not abuse Klonopin, Dr. Doyle Askew entered the room and began discussing Pt's d/c with him.    CSW thanked Pt for his time.    No further psych needs identified, as Pt is connected with [facility] for outpt tx.   Disposition:  Psych Clinical Social Worker signing off  Bernita Raisin, [place] [facility] [phone]

## 2014-09-06 ENCOUNTER — Emergency Department (HOSPITAL_COMMUNITY): Payer: Self-pay

## 2014-09-06 ENCOUNTER — Emergency Department (HOSPITAL_COMMUNITY)
Admission: EM | Admit: 2014-09-06 | Discharge: 2014-09-07 | Disposition: A | Discharge status: Home / Self Care | Payer: Self-pay | Attending: Emergency Medicine | Admitting: Emergency Medicine

## 2014-09-06 ENCOUNTER — Encounter (HOSPITAL_COMMUNITY): Payer: Self-pay | Admitting: Emergency Medicine

## 2014-09-06 DIAGNOSIS — R109 Unspecified abdominal pain: Secondary | ICD-10-CM | POA: Insufficient documentation

## 2014-09-06 DIAGNOSIS — I129 Hypertensive chronic kidney disease with stage 1 through stage 4 chronic kidney disease, or unspecified chronic kidney disease: Secondary | ICD-10-CM | POA: Insufficient documentation

## 2014-09-06 DIAGNOSIS — N189 Chronic kidney disease, unspecified: Secondary | ICD-10-CM | POA: Insufficient documentation

## 2014-09-06 DIAGNOSIS — R195 Other fecal abnormalities: Secondary | ICD-10-CM | POA: Insufficient documentation

## 2014-09-06 DIAGNOSIS — Z79899 Other long term (current) drug therapy: Secondary | ICD-10-CM | POA: Insufficient documentation

## 2014-09-06 DIAGNOSIS — Z8659 Personal history of other mental and behavioral disorders: Secondary | ICD-10-CM | POA: Insufficient documentation

## 2014-09-06 DIAGNOSIS — Z720 Tobacco use: Secondary | ICD-10-CM | POA: Insufficient documentation

## 2014-09-06 LAB — COMPREHENSIVE METABOLIC PANEL
ALBUMIN: 4 g/dL (ref 3.5–5.2)
ALK PHOS: 70 U/L (ref 39–117)
ALT: 19 U/L (ref 0–53)
ANION GAP: 14 (ref 5–15)
AST: 25 U/L (ref 0–37)
BILIRUBIN TOTAL: 0.6 mg/dL (ref 0.3–1.2)
BUN: 18 mg/dL (ref 6–23)
CO2: 22 mEq/L (ref 19–32)
CREATININE: 1.76 mg/dL — AB (ref 0.50–1.35)
Calcium: 9.6 mg/dL (ref 8.4–10.5)
Chloride: 100 mEq/L (ref 96–112)
GFR calc non Af Amer: 42 mL/min — ABNORMAL LOW (ref >90)
GFR, EST AFRICAN AMERICAN: 48 mL/min — AB (ref >90)
GLUCOSE: 92 mg/dL (ref 70–99)
POTASSIUM: 3.7 meq/L (ref 3.7–5.3)
Sodium: 136 mEq/L — ABNORMAL LOW (ref 137–147)
TOTAL PROTEIN: 8 g/dL (ref 6.0–8.3)

## 2014-09-06 LAB — URINALYSIS, ROUTINE W REFLEX MICROSCOPIC
Bilirubin Urine: NEGATIVE
Glucose, UA: NEGATIVE mg/dL
Hgb urine dipstick: NEGATIVE
Ketones, ur: NEGATIVE mg/dL
LEUKOCYTES UA: NEGATIVE
NITRITE: NEGATIVE
PH: 5.5 (ref 5.0–8.0)
Protein, ur: NEGATIVE mg/dL
SPECIFIC GRAVITY, URINE: 1.028 (ref 1.005–1.030)
UROBILINOGEN UA: 0.2 mg/dL (ref 0.0–1.0)

## 2014-09-06 LAB — CBC WITH DIFFERENTIAL/PLATELET
BASOS ABS: 0 10*3/uL (ref 0.0–0.1)
BASOS PCT: 0 % (ref 0–1)
Eosinophils Absolute: 0.1 10*3/uL (ref 0.0–0.7)
Eosinophils Relative: 2 % (ref 0–5)
HEMATOCRIT: 40.9 % (ref 39.0–52.0)
Hemoglobin: 13.6 g/dL (ref 13.0–17.0)
LYMPHS PCT: 40 % (ref 12–46)
Lymphs Abs: 2.6 10*3/uL (ref 0.7–4.0)
MCH: 28 pg (ref 26.0–34.0)
MCHC: 33.3 g/dL (ref 30.0–36.0)
MCV: 84.2 fL (ref 78.0–100.0)
Monocytes Absolute: 0.5 10*3/uL (ref 0.1–1.0)
Monocytes Relative: 8 % (ref 3–12)
NEUTROS ABS: 3.4 10*3/uL (ref 1.7–7.7)
NEUTROS PCT: 50 % (ref 43–77)
Platelets: 303 10*3/uL (ref 150–400)
RBC: 4.86 MIL/uL (ref 4.22–5.81)
RDW: 14.1 % (ref 11.5–15.5)
WBC: 6.6 10*3/uL (ref 4.0–10.5)

## 2014-09-06 MED ORDER — IOHEXOL 300 MG/ML  SOLN
100.0000 mL | Freq: Once | INTRAMUSCULAR | Status: AC | PRN
  Administered 2014-09-06: 100 mL via INTRAVENOUS

## 2014-09-06 MED ORDER — IOHEXOL 300 MG/ML  SOLN: 50.0000 mL | Freq: Once | INTRAMUSCULAR | Status: AC | PRN

## 2014-09-06 NOTE — ED Notes (Signed)
Pt arrived to the ED with a complaint of abdominal pain and rectal bleeding.  Pt states the rectal bleeding has been going on for 10 years.  Pt states that starting Saturday he felt a knot int he upper right hand quadrant of his abdomen and that through out the week the pain has been increasing in intensity.  Pt states the rectal bleeding has stayed the same as previous.  Pt is unaware if he has hemorrhoids or not

## 2014-09-06 NOTE — ED Notes (Signed)
Patient c/o rectal bleeding x10 years, patient states "it comes about 2-3 weeks". Patient states "it comes out clumps like cauliflower and its deep red". Patient denies pain in rectum. Patient denies urinary symptoms. Patient reports "knot in abdomen 4-6 years ago". Patient reports pain to same x2-3 years. Patient reports "constant, sore, achy". Patient rates pain 8/10. Patient is tender to RUQ abdomen upon palpation. Patient denies N/V, fever, chills.

## 2014-09-06 NOTE — ED Provider Notes (Signed)
CSN: [id number]     Arrival date & time 09/06/14  1955 History   First MD Initiated Contact with Patient 09/06/14 2121     Chief Complaint  Patient presents with  . Abdominal Pain  . Rectal Bleeding     (Consider location/radiation/quality/duration/timing/severity/associated sxs/prior Treatment) HPI 58 year old male presents with 2 years of right sided abdominal "mass" that he wants to get checked out. Has had pain in this area for past 2 years. Endorses it is worsening but cannot tell me in what way. His fiancee came to ER today for other complaint so he thought he would get it checked out. No nausea, vomiting, back pain, diarrhea or constipation. endorsees he has had dark blood in stool for past 10 years, not different now. None in past 3-4 days. No rectal pain. No dysuria or hematuria.   Past Medical History  Diagnosis Date  . Hypertension   . Low back pain   . Chronic kidney disease   . Mental disorder    History reviewed. No pertinent past surgical history. Family History  Problem Relation Age of Onset  . Cardiomyopathy Sister   . Factor VIII deficiency Neg Hx   . Lupus Sister   . Kidney failure Sister    History  Substance Use Topics  . Smoking status: Current Every Day Smoker -- 0.50 packs/day    Types: Cigarettes  . Smokeless tobacco: Never Used  . Alcohol Use: No    Review of Systems  Constitutional: Negative for fever.  Gastrointestinal: Positive for abdominal pain, blood in stool and abdominal distention. Negative for nausea, vomiting, diarrhea and constipation.  Genitourinary: Negative for dysuria and hematuria.  Musculoskeletal: Negative for back pain.  All other systems reviewed and are negative.     Allergies  Other  Home Medications   Prior to Admission medications   Medication Sig Start Date End Date Taking? Authorizing Provider  amLODipine (NORVASC) 10 MG tablet Take 10 mg by mouth daily.   Yes Historical Provider, MD  benazepril (LOTENSIN) 10  MG tablet Take 10 mg by mouth daily.   Yes Historical Provider, MD  cetirizine (ZYRTEC) 10 MG tablet Take 10 mg by mouth daily as needed for allergies.   Yes Historical Provider, MD  hydrochlorothiazide (HYDRODIURIL) 25 MG tablet Take 25 mg by mouth daily.   Yes Historical Provider, MD  ibuprofen (ADVIL,MOTRIN) 200 MG tablet Take 600-800 mg by mouth every 6 (six) hours as needed for moderate pain.   Yes Historical Provider, MD  indomethacin (INDOCIN SR) 75 MG CR capsule Take 75 mg by mouth once.   Yes Historical Provider, MD   BP 152/90  Pulse 70  Temp(Src) 98.1 F (36.7 C) (Oral)  Resp 18  SpO2 100% Physical Exam  Nursing note and vitals reviewed. Constitutional: He is oriented to person, place, and time. He appears well-developed and well-nourished. No distress.  HENT:  Head: Normocephalic and atraumatic.  Right Ear: External ear normal.  Left Ear: External ear normal.  Nose: Nose normal.  Eyes: Right eye exhibits no discharge. Left eye exhibits no discharge.  Neck: Neck supple.  Cardiovascular: Normal rate, regular rhythm, normal heart sounds and intact distal pulses.   Pulmonary/Chest: Effort normal and breath sounds normal.  Abdominal: Soft. There is tenderness.    Musculoskeletal: He exhibits no edema.  Neurological: He is alert and oriented to person, place, and time.  Skin: Skin is warm and dry.    ED Course  Procedures (including critical care time) Labs  Review Labs Reviewed  COMPREHENSIVE METABOLIC PANEL - Abnormal; Notable for the following:    Sodium 136 (*)    Creatinine, Ser 1.76 (*)    GFR calc non Af Amer 42 (*)    GFR calc Af Amer 48 (*)    All other components within normal limits  URINALYSIS, ROUTINE W REFLEX MICROSCOPIC - Abnormal; Notable for the following:    Color, Urine AMBER (*)    All other components within normal limits  CBC WITH DIFFERENTIAL    Imaging Review Ct Abdomen Pelvis W Contrast  09/06/2014   CLINICAL DATA:  Rectal bleeding for  10 years. Abdominal pain. Constant, sore, ET pain. Right upper quadrant tenderness on palpation.  EXAM: CT ABDOMEN AND PELVIS WITH CONTRAST  TECHNIQUE: Multidetector CT imaging of the abdomen and pelvis was performed using the standard protocol following bolus administration of intravenous contrast.  CONTRAST:  126mL OMNIPAQUE IOHEXOL 300 MG/ML  SOLN  COMPARISON:  None.  FINDINGS: Lung bases are clear.  The liver, spleen, gallbladder, pancreas, adrenal glands, kidneys, abdominal aorta, inferior vena cava, and retroperitoneal lymph nodes are unremarkable. Stomach, small bowel, and colon appear normal given degree of distention. Stool fills the colon. No evidence of bowel obstruction. Small umbilical hernia containing fat. No free air or free fluid in the abdomen.  Pelvis: The appendix is normal. Rectosigmoid colon is decompressed. Suggestion of asymmetric wall thickening in the rectum which is nonspecific in an incompletely distended bowel but rectal mass or proctitis should be excluded. Prostate gland is mildly enlarged, measuring 4.7 x 4.4 cm. No bladder wall thickening. No free or loculated pelvic fluid collections. No pelvic mass or lymphadenopathy. No evidence of diverticulitis. Degenerative changes in the spine.  IMPRESSION: Nonspecific asymmetric wall thickening in the rectum. Evaluation is limited in decompressed colon but rectal mass or proctitis should be excluded, given the clinical history. No evidence of bowel obstruction. Small umbilical hernia containing fat.   Electronically Signed   By: Lucienne Capers M.D.   On: 09/06/2014 23:35     EKG Interpretation None      MDM   Final diagnoses:  Abdominal pain    Patient has no obvious hernia in the area of his pain. No signs of cancer. He declines rectal exam for his chronic rectal bleeding. No rectal pain, current bleeding or discharge, or urinary symptoms. Discussed the CT results with patient, and will need outpatient GI referral.      Ephraim Hamburger, MD 09/07/14 [phone]

## 2014-09-07 NOTE — Discharge Instructions (Signed)

## 2016-01-30 ENCOUNTER — Inpatient Hospital Stay (HOSPITAL_COMMUNITY): Payer: Medicaid Other

## 2016-01-30 ENCOUNTER — Encounter (HOSPITAL_COMMUNITY): Payer: Self-pay

## 2016-01-30 ENCOUNTER — Emergency Department (HOSPITAL_COMMUNITY): Payer: Medicaid Other

## 2016-01-30 ENCOUNTER — Inpatient Hospital Stay (HOSPITAL_COMMUNITY)
Admission: EM | Admit: 2016-01-30 | Discharge: 2016-02-03 | DRG: 917 | Disposition: A | Discharge status: Home / Self Care | Payer: Medicaid Other | Attending: Internal Medicine | Admitting: Internal Medicine

## 2016-01-30 DIAGNOSIS — J111 Influenza due to unidentified influenza virus with other respiratory manifestations: Secondary | ICD-10-CM | POA: Diagnosis present

## 2016-01-30 DIAGNOSIS — N183 Chronic kidney disease, stage 3 unspecified: Secondary | ICD-10-CM | POA: Diagnosis present

## 2016-01-30 DIAGNOSIS — R4182 Altered mental status, unspecified: Secondary | ICD-10-CM | POA: Diagnosis not present

## 2016-01-30 DIAGNOSIS — R410 Disorientation, unspecified: Secondary | ICD-10-CM

## 2016-01-30 DIAGNOSIS — Z653 Problems related to other legal circumstances: Secondary | ICD-10-CM

## 2016-01-30 DIAGNOSIS — Z79899 Other long term (current) drug therapy: Secondary | ICD-10-CM

## 2016-01-30 DIAGNOSIS — Z888 Allergy status to other drugs, medicaments and biological substances status: Secondary | ICD-10-CM | POA: Diagnosis not present

## 2016-01-30 DIAGNOSIS — N179 Acute kidney failure, unspecified: Secondary | ICD-10-CM | POA: Diagnosis present

## 2016-01-30 DIAGNOSIS — G9340 Encephalopathy, unspecified: Secondary | ICD-10-CM | POA: Diagnosis present

## 2016-01-30 DIAGNOSIS — T50904A Poisoning by unspecified drugs, medicaments and biological substances, undetermined, initial encounter: Secondary | ICD-10-CM

## 2016-01-30 DIAGNOSIS — I129 Hypertensive chronic kidney disease with stage 1 through stage 4 chronic kidney disease, or unspecified chronic kidney disease: Secondary | ICD-10-CM | POA: Diagnosis present

## 2016-01-30 DIAGNOSIS — N184 Chronic kidney disease, stage 4 (severe): Secondary | ICD-10-CM | POA: Diagnosis present

## 2016-01-30 DIAGNOSIS — Z841 Family history of disorders of kidney and ureter: Secondary | ICD-10-CM | POA: Diagnosis not present

## 2016-01-30 DIAGNOSIS — F1411 Cocaine abuse, in remission: Secondary | ICD-10-CM | POA: Diagnosis present

## 2016-01-30 DIAGNOSIS — F17210 Nicotine dependence, cigarettes, uncomplicated: Secondary | ICD-10-CM | POA: Diagnosis present

## 2016-01-30 DIAGNOSIS — E860 Dehydration: Secondary | ICD-10-CM | POA: Diagnosis present

## 2016-01-30 DIAGNOSIS — R509 Fever, unspecified: Secondary | ICD-10-CM

## 2016-01-30 DIAGNOSIS — D631 Anemia in chronic kidney disease: Secondary | ICD-10-CM | POA: Diagnosis present

## 2016-01-30 DIAGNOSIS — F1210 Cannabis abuse, uncomplicated: Secondary | ICD-10-CM | POA: Diagnosis present

## 2016-01-30 DIAGNOSIS — E876 Hypokalemia: Secondary | ICD-10-CM | POA: Diagnosis present

## 2016-01-30 DIAGNOSIS — I10 Essential (primary) hypertension: Secondary | ICD-10-CM | POA: Diagnosis present

## 2016-01-30 DIAGNOSIS — T43591A Poisoning by other antipsychotics and neuroleptics, accidental (unintentional), initial encounter: Secondary | ICD-10-CM | POA: Diagnosis not present

## 2016-01-30 DIAGNOSIS — R45851 Suicidal ideations: Secondary | ICD-10-CM

## 2016-01-30 DIAGNOSIS — F331 Major depressive disorder, recurrent, moderate: Secondary | ICD-10-CM | POA: Diagnosis present

## 2016-01-30 DIAGNOSIS — F1410 Cocaine abuse, uncomplicated: Secondary | ICD-10-CM | POA: Diagnosis present

## 2016-01-30 LAB — COMPREHENSIVE METABOLIC PANEL
ALBUMIN: 3.8 g/dL (ref 3.5–5.0)
ALBUMIN: 3.4 g/dL — AB (ref 3.5–5.0)
ALK PHOS: 48 U/L (ref 38–126)
ALT: 22 U/L (ref 17–63)
ALT: 21 U/L (ref 17–63)
AST: 28 U/L (ref 15–41)
AST: 22 U/L (ref 15–41)
Alkaline Phosphatase: 45 U/L (ref 38–126)
Anion gap: 13 (ref 5–15)
Anion gap: 7 (ref 5–15)
BUN: 24 mg/dL — ABNORMAL HIGH (ref 6–20)
BUN: 21 mg/dL — AB (ref 6–20)
CALCIUM: 8.6 mg/dL — AB (ref 8.9–10.3)
CHLORIDE: 103 mmol/L (ref 101–111)
CHLORIDE: 108 mmol/L (ref 101–111)
CO2: 21 mmol/L — AB (ref 22–32)
CO2: 21 mmol/L — ABNORMAL LOW (ref 22–32)
CREATININE: 2.06 mg/dL — AB (ref 0.61–1.24)
CREATININE: 1.65 mg/dL — AB (ref 0.61–1.24)
Calcium: 8.1 mg/dL — ABNORMAL LOW (ref 8.9–10.3)
GFR calc Af Amer: 51 mL/min — ABNORMAL LOW (ref >60)
GFR calc non Af Amer: 34 mL/min — ABNORMAL LOW (ref >60)
GFR calc non Af Amer: 44 mL/min — ABNORMAL LOW (ref >60)
GFR, EST AFRICAN AMERICAN: 39 mL/min — AB (ref >60)
GLUCOSE: 157 mg/dL — AB (ref 65–99)
GLUCOSE: 113 mg/dL — AB (ref 65–99)
POTASSIUM: 3.3 mmol/L — AB (ref 3.5–5.1)
Potassium: 2.3 mmol/L — CL (ref 3.5–5.1)
SODIUM: 137 mmol/L (ref 135–145)
Sodium: 136 mmol/L (ref 135–145)
Total Bilirubin: 1.4 mg/dL — ABNORMAL HIGH (ref 0.3–1.2)
Total Bilirubin: 1.2 mg/dL (ref 0.3–1.2)
Total Protein: 6.6 g/dL (ref 6.5–8.1)
Total Protein: 6.2 g/dL — ABNORMAL LOW (ref 6.5–8.1)

## 2016-01-30 LAB — ETHANOL: Alcohol, Ethyl (B): <5 mg/dL (ref <5)

## 2016-01-30 LAB — CBC
HEMATOCRIT: 37.3 % — AB (ref 39.0–52.0)
HEMOGLOBIN: 12.4 g/dL — AB (ref 13.0–17.0)
MCH: 28.5 pg (ref 26.0–34.0)
MCHC: 33.2 g/dL (ref 30.0–36.0)
MCV: 85.7 fL (ref 78.0–100.0)
Platelets: 198 10*3/uL (ref 150–400)
RBC: 4.35 MIL/uL (ref 4.22–5.81)
RDW: 14.1 % (ref 11.5–15.5)
WBC: 7.2 10*3/uL (ref 4.0–10.5)

## 2016-01-30 LAB — URINALYSIS, ROUTINE W REFLEX MICROSCOPIC
BILIRUBIN URINE: NEGATIVE
Glucose, UA: NEGATIVE mg/dL
Hgb urine dipstick: NEGATIVE
Ketones, ur: NEGATIVE mg/dL
LEUKOCYTES UA: NEGATIVE
NITRITE: NEGATIVE
Protein, ur: NEGATIVE mg/dL
SPECIFIC GRAVITY, URINE: 1.01 (ref 1.005–1.030)
pH: 6.5 (ref 5.0–8.0)

## 2016-01-30 LAB — BLOOD GAS, ARTERIAL
Acid-base deficit: 2.8 mmol/L — ABNORMAL HIGH (ref 0.0–2.0)
Bicarbonate: 20.9 mEq/L (ref 20.0–24.0)
Drawn by: 276051
FIO2: 0.21
O2 Saturation: 98.6 %
PCO2 ART: 34.7 mmHg — AB (ref 35.0–45.0)
PO2 ART: 84 mmHg (ref 80.0–100.0)
Patient temperature: 98.6
TCO2: 18.9 mmol/L (ref 0–100)
pH, Arterial: 7.398 (ref 7.350–7.450)

## 2016-01-30 LAB — CBC WITH DIFFERENTIAL/PLATELET
Basophils Absolute: 0 10*3/uL (ref 0.0–0.1)
Basophils Relative: 0 %
Eosinophils Absolute: 0 10*3/uL (ref 0.0–0.7)
Eosinophils Relative: 0 %
HEMATOCRIT: 36.5 % — AB (ref 39.0–52.0)
HEMOGLOBIN: 12.2 g/dL — AB (ref 13.0–17.0)
LYMPHS ABS: 1.1 10*3/uL (ref 0.7–4.0)
LYMPHS PCT: 14 %
MCH: 28.6 pg (ref 26.0–34.0)
MCHC: 33.4 g/dL (ref 30.0–36.0)
MCV: 85.7 fL (ref 78.0–100.0)
MONO ABS: 0.4 10*3/uL (ref 0.1–1.0)
MONOS PCT: 5 %
NEUTROS ABS: 6.2 10*3/uL (ref 1.7–7.7)
NEUTROS PCT: 81 %
Platelets: 190 10*3/uL (ref 150–400)
RBC: 4.26 MIL/uL (ref 4.22–5.81)
RDW: 14 % (ref 11.5–15.5)
WBC: 7.7 10*3/uL (ref 4.0–10.5)

## 2016-01-30 LAB — ACETAMINOPHEN LEVEL

## 2016-01-30 LAB — APTT: aPTT: 30 seconds (ref 24–37)

## 2016-01-30 LAB — RAPID URINE DRUG SCREEN, HOSP PERFORMED
Amphetamines: NOT DETECTED
BARBITURATES: NOT DETECTED
BENZODIAZEPINES: NOT DETECTED
COCAINE: POSITIVE — AB
Opiates: NOT DETECTED
TETRAHYDROCANNABINOL: NOT DETECTED

## 2016-01-30 LAB — MAGNESIUM: Magnesium: 1.7 mg/dL (ref 1.7–2.4)

## 2016-01-30 LAB — TSH: TSH: 0.282 u[IU]/mL — ABNORMAL LOW (ref 0.350–4.500)

## 2016-01-30 LAB — TROPONIN I: Troponin I: <0.03 ng/mL (ref <0.031)

## 2016-01-30 LAB — I-STAT CG4 LACTIC ACID, ED
LACTIC ACID, VENOUS: 0.97 mmol/L (ref 0.5–2.0)
Lactic Acid, Venous: 1.1 mmol/L (ref 0.5–2.0)

## 2016-01-30 LAB — AMMONIA: Ammonia: 42 umol/L — ABNORMAL HIGH (ref 9–35)

## 2016-01-30 LAB — SALICYLATE LEVEL: Salicylate Lvl: <4 mg/dL (ref 2.8–30.0)

## 2016-01-30 LAB — PROTIME-INR
INR: 1.22 (ref 0.00–1.49)
PROTHROMBIN TIME: 15.6 s — AB (ref 11.6–15.2)

## 2016-01-30 LAB — CK: Total CK: 262 U/L (ref 49–397)

## 2016-01-30 LAB — PHOSPHORUS: Phosphorus: 2.6 mg/dL (ref 2.5–4.6)

## 2016-01-30 MED ORDER — NALOXONE HCL 2 MG/2ML IJ SOSY: 2.0000 mg | PREFILLED_SYRINGE | Freq: Once | INTRAMUSCULAR | Status: DC

## 2016-01-30 MED ORDER — ACETAMINOPHEN 325 MG PO TABS
650.0000 mg | ORAL_TABLET | Freq: Four times a day (QID) | ORAL | Status: DC | PRN
  Administered 2016-01-31 – 2016-02-02 (×4): 650 mg via ORAL
  Filled 2016-01-30 (×4): qty 2

## 2016-01-30 MED ORDER — NALOXONE HCL 2 MG/2ML IJ SOSY
2.0000 mg | PREFILLED_SYRINGE | Freq: Once | INTRAMUSCULAR | Status: AC
  Administered 2016-01-30: 2 mg via INTRAVENOUS
  Filled 2016-01-30: qty 2

## 2016-01-30 MED ORDER — SODIUM CHLORIDE 0.9 % IV SOLN
INTRAVENOUS | Status: AC
  Administered 2016-01-30: 17:00:00 via INTRAVENOUS

## 2016-01-30 MED ORDER — SODIUM CHLORIDE 0.9 % IV SOLN
INTRAVENOUS | Status: DC
  Administered 2016-01-30: 16:00:00 via INTRAVENOUS

## 2016-01-30 MED ORDER — SODIUM CHLORIDE 0.9 % IV SOLN
1000.0000 mL | Freq: Once | INTRAVENOUS | Status: AC
  Administered 2016-01-30: 1000 mL via INTRAVENOUS

## 2016-01-30 MED ORDER — ONDANSETRON HCL 4 MG/2ML IJ SOLN: 4.0000 mg | Freq: Four times a day (QID) | INTRAMUSCULAR | Status: DC | PRN

## 2016-01-30 MED ORDER — SODIUM CHLORIDE 0.9% FLUSH
3.0000 mL | Freq: Two times a day (BID) | INTRAVENOUS | Status: DC
  Administered 2016-01-30 – 2016-02-02 (×7): 3 mL via INTRAVENOUS

## 2016-01-30 MED ORDER — ACETAMINOPHEN 650 MG RE SUPP: 650.0000 mg | Freq: Four times a day (QID) | RECTAL | Status: DC | PRN

## 2016-01-30 MED ORDER — SODIUM CHLORIDE 0.9 % IV SOLN
1000.0000 mL | INTRAVENOUS | Status: DC
  Administered 2016-01-30 (×2): 1000 mL via INTRAVENOUS

## 2016-01-30 MED ORDER — ONDANSETRON HCL 4 MG PO TABS
4.0000 mg | ORAL_TABLET | Freq: Four times a day (QID) | ORAL | Status: DC | PRN
  Filled 2016-01-30: qty 1

## 2016-01-30 MED ORDER — SODIUM CHLORIDE 0.9 % IV SOLN
INTRAVENOUS | Status: DC
  Administered 2016-01-30 – 2016-02-01 (×2): via INTRAVENOUS

## 2016-01-30 MED ORDER — HYDRALAZINE HCL 20 MG/ML IJ SOLN
10.0000 mg | Freq: Four times a day (QID) | INTRAMUSCULAR | Status: DC | PRN
  Administered 2016-01-30 – 2016-01-31 (×3): 10 mg via INTRAVENOUS
  Filled 2016-01-30 (×4): qty 1

## 2016-01-30 MED ORDER — POTASSIUM CHLORIDE 10 MEQ/100ML IV SOLN
10.0000 meq | INTRAVENOUS | Status: AC
  Administered 2016-01-30 (×3): 10 meq via INTRAVENOUS
  Filled 2016-01-30 (×3): qty 100

## 2016-01-30 MED ORDER — POTASSIUM CHLORIDE 10 MEQ/100ML IV SOLN
10.0000 meq | Freq: Once | INTRAVENOUS | Status: AC
  Administered 2016-01-30: 10 meq via INTRAVENOUS
  Filled 2016-01-30: qty 100

## 2016-01-30 NOTE — Progress Notes (Signed)
Pt not responding with ED Cm visit  ED RN states when he is aroused he presents agitated RN reports when pt aroused he mumbled something about "meth" Male family not aware of ingested item ED RN report pt had been found in a bug ridden hotel room with male family/SO ED Cm deferring all interventions related to pcp and medications (CM EPIC Consult)

## 2016-01-30 NOTE — ED Notes (Signed)
Bed: RESB Expected date:  Expected time:  Means of arrival:  Comments: EMS/OD/unresponsive

## 2016-01-30 NOTE — ED Notes (Signed)
MD at bedside. 

## 2016-01-30 NOTE — ED Notes (Addendum)
Pt with increase agitation. Pt with garbled words however able to follow simple commands with encouragment.. EDP PFIEFER WITNESSED BEHAVIOR. HOLD ON INTUBATION. RRT aware.

## 2016-01-30 NOTE — H&P (Signed)
[facility] History and Physical  Darryl Parsons [id number] DOB: [birth year] DOA: 01/30/2016  Referring physician: ER physician: Dr. Vallery Ridge  PCP: No primary care provider on file. - will set up with [facility] on discharge   Chief Complaint: altered mental status   HPI:  60 year old male with history of hypertension, drug abuse (cocaine and THC). He presented to [facility] ED after his wife called EMS and said patient was unresponsive for about an hour or so. Wife did not know what er husband took but has mentioned even possible methantheline. Pt is not able to provide HPI due to his altered mental status. No respiratory distress. No vomiting. No fevers. Family is not at the bedside to provide details of medical history.   In ED, pt hemodynamically stable. He is obtunded and still not able to give any history. He was given narcan in ED as well and his mental status has not improved. Alcohol level is WNL. UDS is pending.  Blood work also showed Cr of 2.06, potassium of 2.3 (supplemented in ED). CXR showed no acute cardiopulmonary disease.  Assessment & Plan    Active Problems:   Acute encephalopathy / Possible drug overdose / History of cocaine abuse  - Alcohol level WNL on admission - UDS is pending, ammonia level is pending  - Pt did not respond to narcan given by EMS and by ED physician - He is still obtunded  - Will admit to SDU - Monitor on telemetry - Able to protect airways at this point, saturation is 95% on room air    Hypokalemia - Likely due to ?drug overdose - Supplemented - Check magnesium level and check BMP in am    CKD stage 4 - Cr as high as 2.07 (5 years ago) and on this admission within baseline values    Essential hypertension - Blood pressure 131/89 - PO meds on hold (he usually takes Norvasc, hctz and Lotensin)     Anemia of chronic kidney disease - Hemoglobin stable at 12.4  DVT prophylaxis:  - SCD's bilaterally   Radiological Exams on Admission: No  results found.  EKG: I have personally reviewed EKG. EKG shows  Code Status: Full Family Communication: Plan of care discussed with the patient  Disposition Plan: Admit for further evaluation  Leisa Lenz, MD  [facility] Pager [phone]  Time spent in minutes: 75 minutes  Review of Systems:  Unable to obtain due to altered mental status   Past Medical History  Diagnosis Date  . Hypertension   . Low back pain   . Chronic kidney disease   . Mental disorder    No past surgical history on file. Social History:  reports that he has been smoking Cigarettes.  He has been smoking about 0.50 packs per day. He has never used smokeless tobacco. He reports that he uses illicit drugs (Cocaine). He reports that he does not drink alcohol.  Allergies  Allergen Reactions  . Other     Pt states he is allergic to a blood pressure pill. He is unable to state reaction. He is spelling it adelatt and aderlatt.    Family History:  Family History  Problem Relation Age of Onset  . Cardiomyopathy Sister   . Factor VIII deficiency Neg Hx   . Lupus Sister   . Kidney failure Sister      Prior to Admission medications   Medication Sig Start Date End Date Taking? Authorizing Provider  amLODipine (NORVASC) 10 MG tablet Take  10 mg by mouth daily.    Historical Provider, MD  benazepril (LOTENSIN) 10 MG tablet Take 10 mg by mouth daily.    Historical Provider, MD  cetirizine (ZYRTEC) 10 MG tablet Take 10 mg by mouth daily as needed for allergies.    Historical Provider, MD  hydrochlorothiazide (HYDRODIURIL) 25 MG tablet Take 25 mg by mouth daily.    Historical Provider, MD  ibuprofen (ADVIL,MOTRIN) 200 MG tablet Take 600-800 mg by mouth every 6 (six) hours as needed for moderate pain.    Historical Provider, MD  indomethacin (INDOCIN SR) 75 MG CR capsule Take 75 mg by mouth once.    Historical Provider, MD   Physical Exam: Filed Vitals:   01/30/16 1155 01/30/16 1215 01/30/16 1245 01/30/16  1345  BP: 88/56 119/89 109/67 117/74  Pulse: 114 93 93 92  Resp: 15 15 13 10   SpO2: 95% 98% 98% 99%    Physical Exam  Constitutional: Appears well-developed and well-nourished. No distress.  HENT: Normocephalic. No tonsillar erythema or exudates Eyes: Conjunctivae are normal. No scleral icterus.  Neck: Neck supple. No JVD. No tracheal deviation. No thyromegaly.  CVS: tachycardia, S1/S2 +, no murmurs, no gallops, no carotid bruit.  Pulmonary: Effort and breath sounds normal, no stridor, rhonchi, wheezes, rales.  Abdominal: Soft. BS +,  no distension, tenderness, rebound or guarding.  Musculoskeletal: No edema and no tenderness.  Lymphadenopathy: No lymphadenopathy noted, cervical, inguinal. Neuro: Obtunded. No focal neurologic deficits. Skin: Skin is warm and dry. No rash noted.  No erythema. No pallor.  Psychiatric: Unable to test due to altered mental status.   Labs on Admission:  Basic Metabolic Panel:  Recent Labs Lab 01/30/16 1200  NA 137  K 2.3*  CL 103  CO2 21*  GLUCOSE 157*  BUN 24*  CREATININE 2.06*  CALCIUM 8.6*   Liver Function Tests:  Recent Labs Lab 01/30/16 1200  AST 28  ALT 22  ALKPHOS 48  BILITOT 1.4*  PROT 6.6  ALBUMIN 3.8   No results for input(s): LIPASE, AMYLASE in the last 168 hours. No results for input(s): AMMONIA in the last 168 hours. CBC:  Recent Labs Lab 01/30/16 1200  WBC 7.2  HGB 12.4*  HCT 37.3*  MCV 85.7  PLT 198   Cardiac Enzymes: No results for input(s): CKTOTAL, CKMB, CKMBINDEX, TROPONINI in the last 168 hours. BNP: Invalid input(s): POCBNP CBG: No results for input(s): GLUCAP in the last 168 hours.  If 7PM-7AM, please contact night-coverage [email or web address] Password [email or web address] 01/30/2016, 1:47 PM

## 2016-01-30 NOTE — ED Provider Notes (Signed)
CSN: [id number]     Arrival date & time 01/30/16  1147 History   First MD Initiated Contact with Patient 01/30/16 1205     Chief Complaint  Patient presents with  . Drug Overdose  . Altered Mental Status     (Consider location/radiation/quality/duration/timing/severity/associated sxs/prior Treatment) HPI Patient has unknown ingestion or drug overdose. The patient's wife called EMS for the patient being unresponsive. He was apparently in this condition approximately an hour prior to EMS being contacted. No detail at this point time has been given as to what the patient may have ingested. The patient has at one point mentioned methamphetamine to one of the nursing staff. He is however an unreliable historian at this time. Upon EMS arrival they did attempt one dose of Narcan without any change. A shin had been variably somnolent and agitated during transport. They did place a nasal trumpet but did not require further airway support. At this time ancillary history is very limited. Past Medical History  Diagnosis Date  . Hypertension   . Low back pain   . Chronic kidney disease   . Mental disorder    No past surgical history on file. Family History  Problem Relation Age of Onset  . Cardiomyopathy Sister   . Factor VIII deficiency Neg Hx   . Lupus Sister   . Kidney failure Sister    Social History  Substance Use Topics  . Smoking status: Current Every Day Smoker -- 0.50 packs/day    Types: Cigarettes  . Smokeless tobacco: Never Used  . Alcohol Use: No    Review of Systems  Not reviewed due to patient condition level V caveat confusion  Allergies  Other  Home Medications   Prior to Admission medications   Medication Sig Start Date End Date Taking? Authorizing Provider  amLODipine (NORVASC) 10 MG tablet Take 10 mg by mouth daily.    Historical Provider, MD  benazepril (LOTENSIN) 10 MG tablet Take 10 mg by mouth daily.    Historical Provider, MD  cetirizine (ZYRTEC) 10 MG  tablet Take 10 mg by mouth daily as needed for allergies.    Historical Provider, MD  hydrochlorothiazide (HYDRODIURIL) 25 MG tablet Take 25 mg by mouth daily.    Historical Provider, MD  ibuprofen (ADVIL,MOTRIN) 200 MG tablet Take 600-800 mg by mouth every 6 (six) hours as needed for moderate pain.    Historical Provider, MD  indomethacin (INDOCIN SR) 75 MG CR capsule Take 75 mg by mouth once.    Historical Provider, MD   BP 119/89 mmHg  Pulse 93  Temp(Src)   Resp 15  SpO2 98% Physical Exam  Constitutional: He appears well-developed and well-nourished.  Patient is variably sleeping, was stimulus he becomes agitated and combative. Some of his speech is intelligible but not particularly situationally oriented. Predominantly he is objecting to being touched or examined.  HENT:  Head: Normocephalic and atraumatic.  Right Ear: External ear normal.  Left Ear: External ear normal.  Nose: Nose normal.  Mouth/Throat: Oropharynx is clear and moist.  Posterior oropharynx is patent. Examined with tongue depressor no secretions.  Eyes:  Pupils are proximally 2 mm. Extraocular motions are intact.  Neck: Neck supple.  Cardiovascular:  Mild tachycardia no gross rub murmur gallop.  Pulmonary/Chest: Effort normal and breath sounds normal.  When patient is asleep respirations are at approximately 10. Stimulus his voice is clear and he does not have respiratory distress.  Abdominal: Soft. Bowel sounds are normal. He exhibits no distension.  Musculoskeletal:  Patient does use all 4 extremities to pull at nasal trumpet or push away examiner. There does not appear to be any localizing motor deficit to the extremity function.  Neurological:  Variably somnolent or agitated.  Skin: Skin is warm and dry.    ED Course  Procedures  CRITICAL CARE Performed by: Charlesetta Shanks   Total critical care time: 60 minutes  Critical care time was exclusive of separately billable procedures and treating other  patients.  Critical care was necessary to treat or prevent imminent or life-threatening deterioration.  Critical care was time spent personally by me on the following activities: development of treatment plan with patient and/or surrogate as well as nursing, discussions with consultants, evaluation of patient's response to treatment, examination of patient, obtaining history from patient or surrogate, ordering and performing treatments and interventions, ordering and review of laboratory studies, ordering and review of radiographic studies, pulse oximetry and re-evaluation of patient's condition. Labs Review Labs Reviewed  COMPREHENSIVE METABOLIC PANEL - Abnormal; Notable for the following:    Potassium 2.3 (*)    CO2 21 (*)    Glucose, Bld 157 (*)    BUN 24 (*)    Creatinine, Ser 2.06 (*)    Calcium 8.6 (*)    Total Bilirubin 1.4 (*)    GFR calc non Af Amer 34 (*)    GFR calc Af Amer 39 (*)    All other components within normal limits  ACETAMINOPHEN LEVEL - Abnormal; Notable for the following:    Acetaminophen (Tylenol), Serum <10 (*)    All other components within normal limits  CBC - Abnormal; Notable for the following:    Hemoglobin 12.4 (*)    HCT 37.3 (*)    All other components within normal limits  ETHANOL  SALICYLATE LEVEL  URINE RAPID DRUG SCREEN, HOSP PERFORMED  AMMONIA  CK  BLOOD GAS, ARTERIAL  CBG MONITORING, ED  I-STAT CG4 LACTIC ACID, ED    Imaging Review No results found. I have personally reviewed and evaluated these images and lab results as part of my medical decision-making.   EKG Interpretation   Date/Time:  Friday January 30 2016 11:53:04 EDT Ventricular Rate:  103 PR Interval:  143 QRS Duration: 98 QT Interval:  391 QTC Calculation: 512 R Axis:   -44 Text Interpretation:  Sinus tachycardia Probable left atrial enlargement  Left anterior fascicular block Prolonged QT interval Sinus tachycardia QT  prolonged Left axis deviation Abnormal ekg  Confirmed by Carmin Muskrat   MD [phone]) on 01/30/2016 12:08:30 PM       Multiple  rechecks have been made on the patient for mental status and airway.  Consult: (13:29) discussed with intensivist. At this time, advises to hydrate aggressively with normal saline. add CK, ABG and lactic. Admit to [facility] to step down for monitoring. If there is decompensation, critical care can assume care. Consult: Dr. Leisa Lenz for admission. MDM   Final diagnoses:  Drug overdose, undetermined intent, initial encounter  Delirium   Patient presents without significant ancillary history. Port is for drug overdose of unknown etiology. By EMR, patient does have history of substance abuse. Patient did not show any response to narcan administered by EMS. He does have pinpoint pupils. A second dose of Narcan was administered here of 2 mg without change. He has been variably somnolent and agitated. When agitated he is able to follow some commands and his speech is intelligible. He isn't using 4 extremities without a local deficit  present. Initial consideration was for intubation given unknown substance ingestion and potential for decompensation. A sheet has however been watched in the emergency department and I have not seen decompensation in his airway. He does not have any pooling of secretions and awakens to stimulus with agitation. His vital signs have remained stable. This was reviewed with intensivist and at this time patient will be placed in stepdown for close monitoring.    Charlesetta Shanks, MD 01/30/16 1350

## 2016-01-30 NOTE — ED Notes (Signed)
Pt rolled over and urinated on floor large amount. Pt then asked for urinal. Pt rolled over and used urinal with assistance

## 2016-01-30 NOTE — ED Notes (Signed)
PT DID NOT COMPLETE CT

## 2016-01-30 NOTE — ED Notes (Signed)
PT HAS ACTIVE BED BUGS. ENVIRONMENTAL MADE AWARE

## 2016-01-30 NOTE — ED Notes (Signed)
Patient transported to CT 

## 2016-01-30 NOTE — ED Notes (Signed)
EDP AND ADMISSION MD AWARE OF PT'S CURRENT STATUS AND VITALS. NO ORDERS GIVEN

## 2016-01-30 NOTE — ED Notes (Signed)
Responds to verbal. Observed spontaneous movements

## 2016-01-30 NOTE — ED Notes (Signed)
Per [facility]- Wife called pt unresponsive for 1 hour. Ingestion of unknown substance. EMS gave 1mg  Narcan IVP with no response. ABC intact. Airway support with nasal trumpet. Pupil pin point. Pt during route witness agitated behavior.

## 2016-01-30 NOTE — ED Notes (Signed)
Case manager Maudie Mercury present however pt unable due to current status. Referred to floor

## 2016-01-31 DIAGNOSIS — N184 Chronic kidney disease, stage 4 (severe): Secondary | ICD-10-CM

## 2016-01-31 DIAGNOSIS — I10 Essential (primary) hypertension: Secondary | ICD-10-CM

## 2016-01-31 DIAGNOSIS — E876 Hypokalemia: Secondary | ICD-10-CM

## 2016-01-31 DIAGNOSIS — G9340 Encephalopathy, unspecified: Secondary | ICD-10-CM

## 2016-01-31 DIAGNOSIS — F1410 Cocaine abuse, uncomplicated: Secondary | ICD-10-CM

## 2016-01-31 LAB — CBC
HEMATOCRIT: 37.1 % — AB (ref 39.0–52.0)
HEMOGLOBIN: 12.8 g/dL — AB (ref 13.0–17.0)
MCH: 28.4 pg (ref 26.0–34.0)
MCHC: 34.5 g/dL (ref 30.0–36.0)
MCV: 82.4 fL (ref 78.0–100.0)
Platelets: 208 10*3/uL (ref 150–400)
RBC: 4.5 MIL/uL (ref 4.22–5.81)
RDW: 13.9 % (ref 11.5–15.5)
WBC: 9.8 10*3/uL (ref 4.0–10.5)

## 2016-01-31 LAB — BASIC METABOLIC PANEL
ANION GAP: 9 (ref 5–15)
ANION GAP: 9 (ref 5–15)
BUN: 16 mg/dL (ref 6–20)
BUN: 15 mg/dL (ref 6–20)
CHLORIDE: 111 mmol/L (ref 101–111)
CHLORIDE: 112 mmol/L — AB (ref 101–111)
CO2: 19 mmol/L — ABNORMAL LOW (ref 22–32)
CO2: 19 mmol/L — ABNORMAL LOW (ref 22–32)
Calcium: 8.4 mg/dL — ABNORMAL LOW (ref 8.9–10.3)
Calcium: 8.6 mg/dL — ABNORMAL LOW (ref 8.9–10.3)
Creatinine, Ser: 1.34 mg/dL — ABNORMAL HIGH (ref 0.61–1.24)
Creatinine, Ser: 1.45 mg/dL — ABNORMAL HIGH (ref 0.61–1.24)
GFR calc Af Amer: >60 mL/min (ref >60)
GFR calc Af Amer: 60 mL/min — ABNORMAL LOW (ref >60)
GFR, EST NON AFRICAN AMERICAN: 57 mL/min — AB (ref >60)
GFR, EST NON AFRICAN AMERICAN: 52 mL/min — AB (ref >60)
GLUCOSE: 94 mg/dL (ref 65–99)
Glucose, Bld: 96 mg/dL (ref 65–99)
POTASSIUM: 3.2 mmol/L — AB (ref 3.5–5.1)
POTASSIUM: 3.5 mmol/L (ref 3.5–5.1)
SODIUM: 140 mmol/L (ref 135–145)
Sodium: 139 mmol/L (ref 135–145)

## 2016-01-31 LAB — HEMOGLOBIN A1C

## 2016-01-31 LAB — TROPONIN I: Troponin I: <0.03 ng/mL (ref <0.031)

## 2016-01-31 LAB — MRSA PCR SCREENING: MRSA by PCR: NEGATIVE

## 2016-01-31 LAB — T4, FREE: FREE T4: 0.86 ng/dL (ref 0.61–1.12)

## 2016-01-31 MED ORDER — LORAZEPAM 2 MG/ML IJ SOLN
0.5000 mg | INTRAMUSCULAR | Status: DC | PRN
  Administered 2016-01-31 – 2016-02-01 (×3): 0.5 mg via INTRAVENOUS
  Filled 2016-01-31 (×4): qty 1

## 2016-01-31 MED ORDER — DICYCLOMINE HCL 20 MG PO TABS: 20.0000 mg | ORAL_TABLET | Freq: Four times a day (QID) | ORAL | Status: DC | PRN

## 2016-01-31 MED ORDER — HYDROXYZINE HCL 25 MG PO TABS: 25.0000 mg | ORAL_TABLET | Freq: Four times a day (QID) | ORAL | Status: DC | PRN

## 2016-01-31 MED ORDER — METHOCARBAMOL 500 MG PO TABS: 500.0000 mg | ORAL_TABLET | Freq: Three times a day (TID) | ORAL | Status: DC | PRN

## 2016-01-31 MED ORDER — IBUPROFEN 200 MG PO TABS
200.0000 mg | ORAL_TABLET | Freq: Four times a day (QID) | ORAL | Status: DC | PRN
  Administered 2016-01-31: 200 mg via ORAL
  Filled 2016-01-31: qty 1

## 2016-01-31 MED ORDER — LOPERAMIDE HCL 2 MG PO CAPS: 2.0000 mg | ORAL_CAPSULE | ORAL | Status: DC | PRN

## 2016-01-31 MED ORDER — CLONIDINE HCL 0.1 MG PO TABS: 0.1000 mg | ORAL_TABLET | Freq: Every day | ORAL | Status: DC

## 2016-01-31 MED ORDER — CLONIDINE HCL 0.1 MG PO TABS: 0.1000 mg | ORAL_TABLET | ORAL | Status: DC

## 2016-01-31 MED ORDER — CLONIDINE HCL 0.1 MG PO TABS: 0.1000 mg | ORAL_TABLET | Freq: Four times a day (QID) | ORAL | Status: DC

## 2016-01-31 MED ORDER — POTASSIUM CHLORIDE 10 MEQ/100ML IV SOLN: 10.0000 meq | INTRAVENOUS | Status: DC

## 2016-01-31 MED ORDER — POTASSIUM CHLORIDE CRYS ER 20 MEQ PO TBCR
40.0000 meq | EXTENDED_RELEASE_TABLET | Freq: Once | ORAL | Status: AC
  Administered 2016-01-31: 40 meq via ORAL
  Filled 2016-01-31: qty 2

## 2016-01-31 MED ORDER — HYDRALAZINE HCL 20 MG/ML IJ SOLN
10.0000 mg | Freq: Once | INTRAMUSCULAR | Status: AC
  Administered 2016-01-31: 10 mg via INTRAVENOUS
  Filled 2016-01-31: qty 1

## 2016-01-31 MED ORDER — BENAZEPRIL HCL 10 MG PO TABS
10.0000 mg | ORAL_TABLET | Freq: Every day | ORAL | Status: DC
Start: 2016-01-31 — End: 2016-02-01
  Administered 2016-01-31: 10 mg via ORAL
  Filled 2016-01-31 (×2): qty 1

## 2016-01-31 MED ORDER — HYDRALAZINE HCL 20 MG/ML IJ SOLN
20.0000 mg | Freq: Four times a day (QID) | INTRAMUSCULAR | Status: DC | PRN
  Administered 2016-02-01: 20 mg via INTRAVENOUS
  Filled 2016-01-31: qty 1

## 2016-01-31 MED ORDER — GUAIFENESIN-DM 100-10 MG/5ML PO SYRP
5.0000 mL | ORAL_SOLUTION | ORAL | Status: DC | PRN
  Administered 2016-01-31 – 2016-02-02 (×4): 5 mL via ORAL
  Filled 2016-01-31 (×5): qty 10

## 2016-01-31 MED ORDER — AMLODIPINE BESYLATE 10 MG PO TABS
10.0000 mg | ORAL_TABLET | Freq: Every day | ORAL | Status: DC
  Administered 2016-01-31 – 2016-02-02 (×3): 10 mg via ORAL
  Filled 2016-01-31 (×4): qty 1

## 2016-01-31 NOTE — Progress Notes (Signed)
Pt's girlfriend had a bag of candy with her and the pt kept asking her for some. After it was explained to the pt and the girlfriend that the pt was NPO, the pt became very agitated and began to pound his fist on his chest. Security was called and his girlfriend was asked to leave.

## 2016-01-31 NOTE — Progress Notes (Signed)
[facility] Progress Note  Patient: Darryl Parsons [id number]   PCP: No primary care provider on file. DOB: June 25, 1956   DOA: 01/30/2016   DOS: 01/31/2016   Date of Service: the patient was seen and examined on 01/31/2016  Subjective: Patient is alert awake and oriented. Denies having any suicidal or homicidal ideation. Mentions that he was trying to sleep and he has taken Seroquel. He also mentions he has used cocaine yesterday. Does not have any chest and abdominal pain nausea vomiting. No diarrhea no constipation. Nutrition: He was nothing by mouth last night  Brief hospital course: Patient was admitted on 01/30/2016, with complaint of unresponsiveness, was found to have drug overdose with acute kidney injury. Currently further plan is IV hydration.  Assessment and Plan: 1. Acute encephalopathy Likely secondary to increase use of Seroquel. No focal deficit on examination. Resume Prozac. He uses benzodiazepine as needed. We will run it by psychiatry regarding resumption of Seroquel. Patient to transfer out of the stepdown unit. Continue monitoring of telemetry due to prolonged QTC.  2. Acute kidney injury on chronic kidney disease. Likely secondary to dehydration. Improving with IV hydration.  3. Essential hypertension. Blood pressure mildly elevated. Next and continue home medication.  4. History of cocaine abuse. Avoid beta blockers.   Activity: Walking in the room Bowel regimen: last BM prior to arrival DVT Prophylaxis: subcutaneous Heparin Nutrition: Regular diet Advance goals of care discussion: full code  HPI: As per the H and P dictated on admission, "60 year old male with history of hypertension, drug abuse (cocaine and THC). He presented to [facility] ED after his wife called EMS and said patient was unresponsive for about an hour or so. Wife did not know what er husband took but has mentioned even possible methantheline. Pt is not able to provide HPI due to his  altered mental status. No respiratory distress. No vomiting. No fevers. Family is not at the bedside to provide details of medical history.   In ED, pt hemodynamically stable. He is obtunded and still not able to give any history. He was given narcan in ED as well and his mental status has not improved. Alcohol level is WNL. UDS is pending. Blood work also showed Cr of 2.06, potassium of 2.3 (supplemented in ED). CXR showed no acute cardiopulmonary disease. " Procedures: None Consultants: None Antibiotics: Anti-infectives    None       Family Communication: No family was present at bedside, at the time of interview.   Disposition:  Expected discharge date: 02/01/2016 Barriers to safe discharge: Improvement in renal function   Intake/Output Summary (Last 24 hours) at 01/31/16 1643 Last data filed at 01/31/16 0500  Gross per 24 hour  Intake 3142.5 ml  Output   1275 ml  Net 1867.5 ml   Filed Weights   01/30/16 1853 01/31/16 0500  Weight: 87.998 kg (194 lb) 86.6 kg (190 lb 14.7 oz)    Objective: Physical Exam: Filed Vitals:   01/31/16 1000 01/31/16 1100 01/31/16 1200 01/31/16 1230  BP:    166/81  Pulse: 99 98 108 95  Temp:   99.1 F (37.3 C)   TempSrc:   Oral   Resp: 20 23 24 27   Height:      Weight:      SpO2: 97% 94% 93% 95%     General: Appear in mild distress, no Rash; Oral Mucosa moist. Cardiovascular: S1 and S2 Present, no Murmur, no JVD Respiratory: Bilateral Air entry present and Clear  to Auscultation, no Crackles, no wheezes Abdomen: Bowel Sound present, Soft and no tenderness Extremities: no Pedal edema, no calf tenderness Neurology: Grossly no focal neuro deficit.  Data Reviewed: CBC:  Recent Labs Lab 01/30/16 1200 01/30/16 1725 01/31/16 0318  WBC 7.2 7.7 9.8  NEUTROABS  --  6.2  --   HGB 12.4* 12.2* 12.8*  HCT 37.3* 36.5* 37.1*  MCV 85.7 85.7 82.4  PLT 198 190 [id number]   Basic Metabolic Panel:  Recent Labs Lab 01/30/16 1200 01/30/16 1725  01/31/16 0318 01/31/16 1150  NA 137 136 139 140  K 2.3* 3.3* 3.2* 3.5  CL 103 108 111 112*  CO2 21* 21* 19* 19*  GLUCOSE 157* 113* 94 96  BUN 24* 21* 16 15  CREATININE 2.06* 1.65* 1.34* 1.45*  CALCIUM 8.6* 8.1* 8.4* 8.6*  MG  --  1.7  --   --   PHOS  --  2.6  --   --    Liver Function Tests:  Recent Labs Lab 01/30/16 1200 01/30/16 1725  AST 28 22  ALT 22 21  ALKPHOS 48 45  BILITOT 1.4* 1.2  PROT 6.6 6.2*  ALBUMIN 3.8 3.4*   No results for input(s): LIPASE, AMYLASE in the last 168 hours.  Recent Labs Lab 01/30/16 1412  AMMONIA 42*    Cardiac Enzymes:  Recent Labs Lab 01/30/16 1200 01/30/16 1725 01/31/16 0318  CKTOTAL 262  --   --   TROPONINI  --  <0.03 <0.03    BNP (last 3 results) No results for input(s): BNP in the last 8760 hours.  CBG: No results for input(s): GLUCAP in the last 168 hours.  Recent Results (from the past 240 hour(s))  MRSA PCR Screening     Status: None   Collection Time: 01/30/16  9:04 PM  Result Value Ref Range Status   MRSA by PCR NEGATIVE NEGATIVE Final    Comment:        The GeneXpert MRSA Assay (FDA approved for NASAL specimens only), is one component of a comprehensive MRSA colonization surveillance program. It is not intended to diagnose MRSA infection nor to guide or monitor treatment for MRSA infections.      Studies: No results found.   Scheduled Meds: . amLODipine  10 mg Oral Daily  . benazepril  10 mg Oral Daily  . sodium chloride flush  3 mL Intravenous Q12H   Continuous Infusions: . sodium chloride 75 mL/hr at 01/30/16 1716   PRN Meds: acetaminophen **OR** acetaminophen, dicyclomine, hydrALAZINE, hydrOXYzine, ibuprofen, loperamide, LORazepam, methocarbamol, ondansetron **OR** ondansetron (ZOFRAN) IV  Time spent: 30 minutes  Author: Berle Mull, MD [facility] Pager: [phone] 01/31/2016 4:43 PM  If 7PM-7AM, please contact night-coverage at [email or web address], password [email or web address]

## 2016-02-01 DIAGNOSIS — F331 Major depressive disorder, recurrent, moderate: Secondary | ICD-10-CM | POA: Diagnosis present

## 2016-02-01 LAB — BASIC METABOLIC PANEL
ANION GAP: 8 (ref 5–15)
BUN: 14 mg/dL (ref 6–20)
CALCIUM: 8.5 mg/dL — AB (ref 8.9–10.3)
CO2: 20 mmol/L — AB (ref 22–32)
Chloride: 112 mmol/L — ABNORMAL HIGH (ref 101–111)
Creatinine, Ser: 1.72 mg/dL — ABNORMAL HIGH (ref 0.61–1.24)
GFR calc Af Amer: 49 mL/min — ABNORMAL LOW (ref >60)
GFR calc non Af Amer: 42 mL/min — ABNORMAL LOW (ref >60)
GLUCOSE: 100 mg/dL — AB (ref 65–99)
Potassium: 3.4 mmol/L — ABNORMAL LOW (ref 3.5–5.1)
Sodium: 140 mmol/L (ref 135–145)

## 2016-02-01 LAB — CBC WITH DIFFERENTIAL/PLATELET
BASOS PCT: 0 %
Basophils Absolute: 0 10*3/uL (ref 0.0–0.1)
EOS PCT: 0 %
Eosinophils Absolute: 0 10*3/uL (ref 0.0–0.7)
HEMATOCRIT: 40 % (ref 39.0–52.0)
Hemoglobin: 13.1 g/dL (ref 13.0–17.0)
Lymphocytes Relative: 21 %
Lymphs Abs: 1.4 10*3/uL (ref 0.7–4.0)
MCH: 28.7 pg (ref 26.0–34.0)
MCHC: 32.8 g/dL (ref 30.0–36.0)
MCV: 87.5 fL (ref 78.0–100.0)
MONO ABS: 1.1 10*3/uL — AB (ref 0.1–1.0)
MONOS PCT: 16 %
NEUTROS ABS: 4.4 10*3/uL (ref 1.7–7.7)
Neutrophils Relative %: 63 %
Platelets: 194 10*3/uL (ref 150–400)
RBC: 4.57 MIL/uL (ref 4.22–5.81)
RDW: 14.3 % (ref 11.5–15.5)
WBC: 6.9 10*3/uL (ref 4.0–10.5)

## 2016-02-01 MED ORDER — MIRTAZAPINE 15 MG PO TABS
15.0000 mg | ORAL_TABLET | Freq: Every day | ORAL | Status: DC
  Administered 2016-02-01 – 2016-02-02 (×2): 15 mg via ORAL
  Filled 2016-02-01 (×3): qty 1

## 2016-02-01 MED ORDER — HYDRALAZINE HCL 50 MG PO TABS
50.0000 mg | ORAL_TABLET | Freq: Three times a day (TID) | ORAL | Status: DC
  Administered 2016-02-01 – 2016-02-03 (×7): 50 mg via ORAL
  Filled 2016-02-01 (×9): qty 1

## 2016-02-01 MED ORDER — ISOSORBIDE MONONITRATE ER 30 MG PO TB24
30.0000 mg | ORAL_TABLET | Freq: Every day | ORAL | Status: DC
  Administered 2016-02-01 – 2016-02-02 (×2): 30 mg via ORAL
  Filled 2016-02-01 (×3): qty 1

## 2016-02-01 MED ORDER — FLUOXETINE HCL 20 MG PO CAPS
20.0000 mg | ORAL_CAPSULE | Freq: Every day | ORAL | Status: DC
  Administered 2016-02-01 – 2016-02-02 (×2): 20 mg via ORAL
  Filled 2016-02-01 (×3): qty 1

## 2016-02-01 NOTE — Progress Notes (Signed)
[facility] Progress Note  Patient: Darryl Parsons [id number]   PCP: No primary care provider on file. DOB: August 27, 1956   DOA: 01/30/2016   DOS: 02/01/2016   Date of Service: the patient was seen and examined on 02/01/2016  Subjective: Agitated this morning. Does not have any chest pain or abdominal pain. Had a fever last night although denies having any complains of cough, shortness of breath, chest pain, abdominal pain, nausea, vomiting, diarrhea, burning urination, leg pain and leg cramps Nutrition: Tolerating oral diet  Brief hospital course: Patient was admitted on 01/30/2016, with complaint of unresponsiveness, was found to have drug overdose with acute kidney injury. Currently further plan is blood pressure control as well as controlling agitation  Assessment and Plan: 1. Major depressive disorder, recurrent episode, moderate (HCC) Likely secondary to increase use of Seroquel. No focal deficit on examination. Resume Prozac. Patient was placed on Remeron Psychiatric: Still continue current management. Will discontinue Seroquel and discharge, patient will follow up with psychiatry as an outpatient. Patient to transfer out of the stepdown unit. Continue monitoring of telemetry due to prolonged QTC.   2. Acute kidney injury on chronic kidney disease. Likely secondary to dehydration. Discontinue nephrotoxic medication at present  3. Essential hypertension. Blood pressure mildly elevated  continue amlodipine, add hydralazine, discontinue ACE inhibitor  4. History of cocaine abuse. Avoid beta blockers.   Activity: Walking in the room Bowel regimen: last BM 01/31/2016 DVT Prophylaxis: subcutaneous Heparin Nutrition: Regular diet Advance goals of care discussion: full code  HPI: As per the H and P dictated on admission, "60 year old male with history of hypertension, drug abuse (cocaine and THC). He presented to [facility] ED after his wife called EMS and said patient was  unresponsive for about an hour or so. Wife did not know what er husband took but has mentioned even possible methantheline. Pt is not able to provide HPI due to his altered mental status. No respiratory distress. No vomiting. No fevers. Family is not at the bedside to provide details of medical history.   In ED, pt hemodynamically stable. He is obtunded and still not able to give any history. He was given narcan in ED as well and his mental status has not improved. Alcohol level is WNL. UDS is pending. Blood work also showed Cr of 2.06, potassium of 2.3 (supplemented in ED). CXR showed no acute cardiopulmonary disease."  Procedures: None Consultants: Psychiatric Antibiotics: Anti-infectives    None      Family Communication: No family was present at bedside, at the time of interview.   Disposition:  Expected discharge date: 02/02/2016 Barriers to safe discharge: Improvement in renal function   Intake/Output Summary (Last 24 hours) at 02/01/16 1701 Last data filed at 02/01/16 0625  Gross per 24 hour  Intake   1885 ml  Output    975 ml  Net    910 ml   Filed Weights   01/30/16 1853 01/31/16 0500 02/01/16 0442  Weight: 87.998 kg (194 lb) 86.6 kg (190 lb 14.7 oz) 86.9 kg (191 lb 9.3 oz)   Objective: Physical Exam: Filed Vitals:   02/01/16 1100 02/01/16 1200 02/01/16 1300 02/01/16 1400  BP: 174/95 174/101 160/86   Pulse: 99 97 99 91  Temp:  98.3 F (36.8 C)  99.6 F (37.6 C)  TempSrc:  Oral  Oral  Resp: 26 31 23 24   Height:      Weight:      SpO2: 94% 96% 95% 94%  General: Appear in mild distress, no Rash; Oral Mucosa moist. Cardiovascular: S1 and S2 Present, no Murmur, no JVD Respiratory: Bilateral Air entry present and Clear to Auscultation, no Crackles, no wheezes Abdomen: Bowel Sound present, Soft and no tenderness Extremities: no Pedal edema, no calf tenderness  Data Reviewed: CBC:  Recent Labs Lab 01/30/16 1200 01/30/16 1725 01/31/16 0318  02/01/16 0416  WBC 7.2 7.7 9.8 6.9  NEUTROABS  --  6.2  --  4.4  HGB 12.4* 12.2* 12.8* 13.1  HCT 37.3* 36.5* 37.1* 40.0  MCV 85.7 85.7 82.4 87.5  PLT 198 190 208 [id number]   Basic Metabolic Panel:  Recent Labs Lab 01/30/16 1200 01/30/16 1725 01/31/16 0318 01/31/16 1150 02/01/16 0416  NA 137 136 139 140 140  K 2.3* 3.3* 3.2* 3.5 3.4*  CL 103 108 111 112* 112*  CO2 21* 21* 19* 19* 20*  GLUCOSE 157* 113* 94 96 100*  BUN 24* 21* 16 15 14   CREATININE 2.06* 1.65* 1.34* 1.45* 1.72*  CALCIUM 8.6* 8.1* 8.4* 8.6* 8.5*  MG  --  1.7  --   --   --   PHOS  --  2.6  --   --   --    Liver Function Tests:  Recent Labs Lab 01/30/16 1200 01/30/16 1725  AST 28 22  ALT 22 21  ALKPHOS 48 45  BILITOT 1.4* 1.2  PROT 6.6 6.2*  ALBUMIN 3.8 3.4*   No results for input(s): LIPASE, AMYLASE in the last 168 hours.  Recent Labs Lab 01/30/16 1412  AMMONIA 42*    Cardiac Enzymes:  Recent Labs Lab 01/30/16 1200 01/30/16 1725 01/31/16 0318  CKTOTAL 262  --   --   TROPONINI  --  <0.03 <0.03    BNP (last 3 results) No results for input(s): BNP in the last 8760 hours.  CBG: No results for input(s): GLUCAP in the last 168 hours.  Recent Results (from the past 240 hour(s))  MRSA PCR Screening     Status: None   Collection Time: 01/30/16  9:04 PM  Result Value Ref Range Status   MRSA by PCR NEGATIVE NEGATIVE Final    Comment:        The GeneXpert MRSA Assay (FDA approved for NASAL specimens only), is one component of a comprehensive MRSA colonization surveillance program. It is not intended to diagnose MRSA infection nor to guide or monitor treatment for MRSA infections.      Studies: No results found.   Scheduled Meds: . amLODipine  10 mg Oral Daily  . FLUoxetine  20 mg Oral Daily  . hydrALAZINE  50 mg Oral 3 times per day  . isosorbide mononitrate  30 mg Oral Daily  . mirtazapine  15 mg Oral QHS  . sodium chloride flush  3 mL Intravenous Q12H   Continuous  Infusions:   PRN Meds: acetaminophen **OR** acetaminophen, guaiFENesin-dextromethorphan, hydrALAZINE, LORazepam, ondansetron **OR** ondansetron (ZOFRAN) IV  Time spent: 30 minutes  Author: Berle Mull, MD [facility] Pager: [phone] 02/01/2016 5:01 PM  If 7PM-7AM, please contact night-coverage at [email or web address], password [facility]

## 2016-02-01 NOTE — Consult Note (Addendum)
[place] [facility] Consult   Reason for Consult:depression, unintentional medication overdose Referring Physician:  Dr. Posey Pronto Patient Identification: Darryl Parsons MRN:  [id number] Principal Diagnosis: Major depressive disorder, recurrent episode, moderate ([place]) Diagnosis:   Patient Active Problem List   Diagnosis Date Noted  . Major depressive disorder, recurrent episode, moderate ([place]) [F33.1] 02/01/2016    Priority: High  . Cocaine abuse [F14.10] 08/21/2012    Priority: High  . Acute encephalopathy [G93.40] 01/30/2016  . CKD (chronic kidney disease) stage 4, GFR 15-29 ml/min (HCC) [N18.4] 01/30/2016  . Benign essential HTN [I10] 01/30/2016  . Hypokalemia [E87.6] 08/21/2012    Total Time spent with patient: 55 minutes  Subjective:   Darryl Parsons is a 60 y.o. male patient admitted with unintentional medication overdose.  HPI:  Thanks for asking me to do a psychiatric consultation on Darryl Parsons, a 60 year old male with history of depression, hypertension, drug abuse (cocaine and THC). Patient reports that he has been getting increasingly stressed out and overwhelmed since he got out prison in January, 2016, he did 3.5 years for B/E. He is currently on parole till September, 2017. Patient reports that he has been getting depressed, hopeless and having difficulty sleeping. He has been seeing a doctor at [facility] who prescribed Prozac for depression and Seroquel 200 mg at bedtime for sleep. He states that he took 9 tablets of 200 mg of Seroquel so that he can sleep well, denied intentionally trying to harm himself. Today, patient denies suicidal, homicidal thoughts, psychosis, delusional thinking but still feeling a little depressed.   Past Psychiatric History: Depression  Risk to Self: Is patient at risk for suicide?: No Risk to Others:   Prior Inpatient Therapy:   Prior Outpatient Therapy:    Past Medical History:  Past Medical History  Diagnosis Date  . Hypertension    . Low back pain   . Chronic kidney disease   . Mental disorder    History reviewed. No pertinent past surgical history. Family History:  Family History  Problem Relation Age of Onset  . Cardiomyopathy Sister   . Factor VIII deficiency Neg Hx   . Lupus Sister   . Kidney failure Sister    Family Psychiatric  History:  Social History:  History  Alcohol Use No     History  Drug Use  . Yes  . Special: Cocaine    Social History   Social History  . Marital Status: Single    Spouse Name: N/A  . Number of Children: N/A  . Years of Education: N/A   Social History Main Topics  . Smoking status: Current Every Day Smoker -- 0.50 packs/day    Types: Cigarettes  . Smokeless tobacco: Never Used  . Alcohol Use: No  . Drug Use: Yes    Special: Cocaine  . Sexual Activity: No   Other Topics Concern  . None   Social History Narrative   Additional Social History:    Allergies:   Allergies  Allergen Reactions  . Other     Pt states he is allergic to a blood pressure pill. He is unable to state reaction. He is spelling it adelatt and aderlatt.    Labs:  Results for orders placed or performed during the hospital encounter of 01/30/16 (from the past 48 hour(s))  Comprehensive metabolic panel     Status: Abnormal   Collection Time: 01/30/16  5:25 PM  Result Value Ref Range   Sodium 136 135 - 145 mmol/L  Potassium 3.3 (L) 3.5 - 5.1 mmol/L    Comment: DELTA CHECK NOTED REPEATED TO VERIFY NO VISIBLE HEMOLYSIS    Chloride 108 101 - 111 mmol/L   CO2 21 (L) 22 - 32 mmol/L   Glucose, Bld 113 (H) 65 - 99 mg/dL   BUN 21 (H) 6 - 20 mg/dL   Creatinine, Ser 1.65 (H) 0.61 - 1.24 mg/dL   Calcium 8.1 (L) 8.9 - 10.3 mg/dL   Total Protein 6.2 (L) 6.5 - 8.1 g/dL   Albumin 3.4 (L) 3.5 - 5.0 g/dL   AST 22 15 - 41 U/L   ALT 21 17 - 63 U/L   Alkaline Phosphatase 45 38 - 126 U/L   Total Bilirubin 1.2 0.3 - 1.2 mg/dL   GFR calc non Af Amer 44 (L) >60 mL/min   GFR calc Af Amer 51 (L)  >60 mL/min    Comment: (NOTE) The eGFR has been calculated using the CKD EPI equation. This calculation has not been validated in all clinical situations. eGFR's persistently <60 mL/min signify possible Chronic Kidney Disease.    Anion gap 7 5 - 15  Magnesium     Status: None   Collection Time: 01/30/16  5:25 PM  Result Value Ref Range   Magnesium 1.7 1.7 - 2.4 mg/dL  Phosphorus     Status: None   Collection Time: 01/30/16  5:25 PM  Result Value Ref Range   Phosphorus 2.6 2.5 - 4.6 mg/dL  CBC WITH DIFFERENTIAL     Status: Abnormal   Collection Time: 01/30/16  5:25 PM  Result Value Ref Range   WBC 7.7 4.0 - 10.5 K/uL   RBC 4.26 4.22 - 5.81 MIL/uL   Hemoglobin 12.2 (L) 13.0 - 17.0 g/dL   HCT 36.5 (L) 39.0 - 52.0 %   MCV 85.7 78.0 - 100.0 fL   MCH 28.6 26.0 - 34.0 pg   MCHC 33.4 30.0 - 36.0 g/dL   RDW 14.0 11.5 - 15.5 %   Platelets 190 150 - 400 K/uL   Neutrophils Relative % 81 %   Neutro Abs 6.2 1.7 - 7.7 K/uL   Lymphocytes Relative 14 %   Lymphs Abs 1.1 0.7 - 4.0 K/uL   Monocytes Relative 5 %   Monocytes Absolute 0.4 0.1 - 1.0 K/uL   Eosinophils Relative 0 %   Eosinophils Absolute 0.0 0.0 - 0.7 K/uL   Basophils Relative 0 %   Basophils Absolute 0.0 0.0 - 0.1 K/uL  APTT     Status: None   Collection Time: 01/30/16  5:25 PM  Result Value Ref Range   aPTT 30 24 - 37 seconds  Protime-INR     Status: Abnormal   Collection Time: 01/30/16  5:25 PM  Result Value Ref Range   Prothrombin Time 15.6 (H) 11.6 - 15.2 seconds   INR 1.22 0.00 - 1.49  TSH     Status: Abnormal   Collection Time: 01/30/16  5:25 PM  Result Value Ref Range   TSH 0.282 (L) 0.350 - 4.500 uIU/mL  Hemoglobin A1c     Status: Abnormal   Collection Time: 01/30/16  5:25 PM  Result Value Ref Range   Hgb A1c MFr Bld <4.3 (L) 4.8 - 5.6 %    Comment: (NOTE)         Pre-diabetes: 5.7 - 6.4         Diabetes: >6.4         Glycemic control for adults with diabetes: <7.0  Mean Plasma Glucose <77 mg/dL     Comment: (NOTE) Performed At: [facility] [place], [place] [id number] Lindon Romp MD [id number]   Troponin I     Status: None   Collection Time: 01/30/16  5:25 PM  Result Value Ref Range   Troponin I <0.03 <0.031 ng/mL    Comment:        NO INDICATION OF MYOCARDIAL INJURY.   I-Stat CG4 Lactic Acid, ED     Status: None   Collection Time: 01/30/16  5:38 PM  Result Value Ref Range   Lactic Acid, Venous 0.97 0.5 - 2.0 mmol/L  Urine rapid drug screen (hosp performed) (Not at [facility])     Status: Abnormal   Collection Time: 01/30/16  6:53 PM  Result Value Ref Range   Opiates NONE DETECTED NONE DETECTED   Cocaine POSITIVE (A) NONE DETECTED   Benzodiazepines NONE DETECTED NONE DETECTED   Amphetamines NONE DETECTED NONE DETECTED   Tetrahydrocannabinol NONE DETECTED NONE DETECTED   Barbiturates NONE DETECTED NONE DETECTED    Comment:        DRUG SCREEN FOR MEDICAL PURPOSES ONLY.  IF CONFIRMATION IS NEEDED FOR ANY PURPOSE, NOTIFY LAB WITHIN 5 DAYS.        LOWEST DETECTABLE LIMITS FOR URINE DRUG SCREEN Drug Class       Cutoff (ng/mL) Amphetamine      1000 Barbiturate      200 Benzodiazepine   573 Tricyclics       220 Opiates          300 Cocaine          300 THC              50   Urinalysis, Routine w reflex microscopic (not at [facility])     Status: Abnormal   Collection Time: 01/30/16  6:53 PM  Result Value Ref Range   Color, Urine YELLOW YELLOW   APPearance CLOUDY (A) CLEAR   Specific Gravity, Urine 1.010 1.005 - 1.030   pH 6.5 5.0 - 8.0   Glucose, UA NEGATIVE NEGATIVE mg/dL   Hgb urine dipstick NEGATIVE NEGATIVE   Bilirubin Urine NEGATIVE NEGATIVE   Ketones, ur NEGATIVE NEGATIVE mg/dL   Protein, ur NEGATIVE NEGATIVE mg/dL   Nitrite NEGATIVE NEGATIVE   Leukocytes, UA NEGATIVE NEGATIVE    Comment: MICROSCOPIC NOT DONE ON URINES WITH NEGATIVE PROTEIN, BLOOD, LEUKOCYTES, NITRITE, OR GLUCOSE <1000 mg/dL.  MRSA PCR Screening     Status: None    Collection Time: 01/30/16  9:04 PM  Result Value Ref Range   MRSA by PCR NEGATIVE NEGATIVE    Comment:        The GeneXpert MRSA Assay (FDA approved for NASAL specimens only), is one component of a comprehensive MRSA colonization surveillance program. It is not intended to diagnose MRSA infection nor to guide or monitor treatment for MRSA infections.   Troponin I     Status: None   Collection Time: 01/31/16  3:18 AM  Result Value Ref Range   Troponin I <0.03 <0.031 ng/mL    Comment:        NO INDICATION OF MYOCARDIAL INJURY.   Basic metabolic panel     Status: Abnormal   Collection Time: 01/31/16  3:18 AM  Result Value Ref Range   Sodium 139 135 - 145 mmol/L   Potassium 3.2 (L) 3.5 - 5.1 mmol/L   Chloride 111 101 - 111 mmol/L   CO2 19 (L) 22 - 32  mmol/L   Glucose, Bld 94 65 - 99 mg/dL   BUN 16 6 - 20 mg/dL   Creatinine, Ser 1.34 (H) 0.61 - 1.24 mg/dL   Calcium 8.4 (L) 8.9 - 10.3 mg/dL   GFR calc non Af Amer 57 (L) >60 mL/min   GFR calc Af Amer >60 >60 mL/min    Comment: (NOTE) The eGFR has been calculated using the CKD EPI equation. This calculation has not been validated in all clinical situations. eGFR's persistently <60 mL/min signify possible Chronic Kidney Disease.    Anion gap 9 5 - 15  CBC     Status: Abnormal   Collection Time: 01/31/16  3:18 AM  Result Value Ref Range   WBC 9.8 4.0 - 10.5 K/uL   RBC 4.50 4.22 - 5.81 MIL/uL   Hemoglobin 12.8 (L) 13.0 - 17.0 g/dL   HCT 37.1 (L) 39.0 - 52.0 %   MCV 82.4 78.0 - 100.0 fL   MCH 28.4 26.0 - 34.0 pg   MCHC 34.5 30.0 - 36.0 g/dL   RDW 13.9 11.5 - 15.5 %   Platelets 208 150 - 400 K/uL  T4, free     Status: None   Collection Time: 01/31/16 11:50 AM  Result Value Ref Range   Free T4 0.86 0.61 - 1.12 ng/dL    Comment: Performed at [facility] metabolic panel     Status: Abnormal   Collection Time: 01/31/16 11:50 AM  Result Value Ref Range   Sodium 140 135 - 145 mmol/L   Potassium 3.5 3.5 - 5.1  mmol/L   Chloride 112 (H) 101 - 111 mmol/L   CO2 19 (L) 22 - 32 mmol/L   Glucose, Bld 96 65 - 99 mg/dL   BUN 15 6 - 20 mg/dL   Creatinine, Ser 1.45 (H) 0.61 - 1.24 mg/dL   Calcium 8.6 (L) 8.9 - 10.3 mg/dL   GFR calc non Af Amer 52 (L) >60 mL/min   GFR calc Af Amer 60 (L) >60 mL/min    Comment: (NOTE) The eGFR has been calculated using the CKD EPI equation. This calculation has not been validated in all clinical situations. eGFR's persistently <60 mL/min signify possible Chronic Kidney Disease.    Anion gap 9 5 - 15  Basic metabolic panel     Status: Abnormal   Collection Time: 02/01/16  4:16 AM  Result Value Ref Range   Sodium 140 135 - 145 mmol/L   Potassium 3.4 (L) 3.5 - 5.1 mmol/L   Chloride 112 (H) 101 - 111 mmol/L   CO2 20 (L) 22 - 32 mmol/L   Glucose, Bld 100 (H) 65 - 99 mg/dL   BUN 14 6 - 20 mg/dL   Creatinine, Ser 1.72 (H) 0.61 - 1.24 mg/dL   Calcium 8.5 (L) 8.9 - 10.3 mg/dL   GFR calc non Af Amer 42 (L) >60 mL/min   GFR calc Af Amer 49 (L) >60 mL/min    Comment: (NOTE) The eGFR has been calculated using the CKD EPI equation. This calculation has not been validated in all clinical situations. eGFR's persistently <60 mL/min signify possible Chronic Kidney Disease.    Anion gap 8 5 - 15  CBC with Differential/Platelet     Status: Abnormal   Collection Time: 02/01/16  4:16 AM  Result Value Ref Range   WBC 6.9 4.0 - 10.5 K/uL   RBC 4.57 4.22 - 5.81 MIL/uL   Hemoglobin 13.1 13.0 - 17.0 g/dL   HCT  40.0 39.0 - 52.0 %   MCV 87.5 78.0 - 100.0 fL   MCH 28.7 26.0 - 34.0 pg   MCHC 32.8 30.0 - 36.0 g/dL   RDW 14.3 11.5 - 15.5 %   Platelets 194 150 - 400 K/uL   Neutrophils Relative % 63 %   Neutro Abs 4.4 1.7 - 7.7 K/uL   Lymphocytes Relative 21 %   Lymphs Abs 1.4 0.7 - 4.0 K/uL   Monocytes Relative 16 %   Monocytes Absolute 1.1 (H) 0.1 - 1.0 K/uL   Eosinophils Relative 0 %   Eosinophils Absolute 0.0 0.0 - 0.7 K/uL   Basophils Relative 0 %   Basophils Absolute 0.0  0.0 - 0.1 K/uL    Current Facility-Administered Medications  Medication Dose Route Frequency Provider Last Rate Last Dose  . acetaminophen (TYLENOL) tablet 650 mg  650 mg Oral Q6H PRN Robbie Lis, MD   650 mg at 02/01/16 1145   Or  . acetaminophen (TYLENOL) suppository 650 mg  650 mg Rectal Q6H PRN Robbie Lis, MD      . amLODipine (NORVASC) tablet 10 mg  10 mg Oral Daily Lavina Hamman, MD   10 mg at 02/01/16 1146  . FLUoxetine (PROZAC) capsule 20 mg  20 mg Oral Daily Lavina Hamman, MD   20 mg at 02/01/16 1146  . guaiFENesin-dextromethorphan (ROBITUSSIN DM) 100-10 MG/5ML syrup 5 mL  5 mL Oral Q4H PRN Dianne Dun, NP   5 mL at 02/01/16 1144  . hydrALAZINE (APRESOLINE) injection 20 mg  20 mg Intravenous Q6H PRN Dianne Dun, NP   20 mg at 02/01/16 0437  . hydrALAZINE (APRESOLINE) tablet 50 mg  50 mg Oral 3 times per day Lavina Hamman, MD   50 mg at 02/01/16 0815  . isosorbide mononitrate (IMDUR) 24 hr tablet 30 mg  30 mg Oral Daily Lavina Hamman, MD   30 mg at 02/01/16 1145  . LORazepam (ATIVAN) injection 0.5 mg  0.5 mg Intravenous Q4H PRN Lavina Hamman, MD   0.5 mg at 02/01/16 0837  . mirtazapine (REMERON) tablet 15 mg  15 mg Oral QHS Lavina Hamman, MD      . ondansetron [facility]) tablet 4 mg  4 mg Oral Q6H PRN Robbie Lis, MD       Or  . ondansetron [facility]) injection 4 mg  4 mg Intravenous Q6H PRN Robbie Lis, MD      . sodium chloride flush (NS) 0.9 % injection 3 mL  3 mL Intravenous Q12H Robbie Lis, MD   3 mL at 02/01/16 1147    Musculoskeletal: Strength & Muscle Tone: within normal limits Gait & Station: normal Patient leans: N/A  Psychiatric Specialty Exam: Review of Systems  Constitutional: Negative.   HENT: Negative.   Eyes: Negative.   Respiratory: Negative.   Cardiovascular: Negative.   Gastrointestinal: Negative.   Genitourinary: Negative.   Musculoskeletal: Negative.   Skin: Negative.   Neurological: Negative.    Endo/Heme/Allergies: Negative.   Psychiatric/Behavioral: Positive for depression. The patient has insomnia.     Blood pressure 160/86, pulse 91, temperature 98.3 F (36.8 C), temperature source Oral, resp. rate 24, height _0  (1.753 m), weight 86.9 kg (191 lb 9.3 oz), SpO2 94 %.Body mass index is 28.28 kg/(m^2).  General Appearance: Casual  Eye Contact::  Good  Speech:  Clear and Coherent  Volume:  Normal  Mood:  Dysphoric  Affect:  Constricted  Thought  Process:  Goal Directed  Orientation:  Full (Time, Place, and Person)  Thought Content:  Negative  Suicidal Thoughts:  No  Homicidal Thoughts:  No  Memory:  Immediate;   Good Recent;   Good Remote;   Good  Judgement:  Fair  Insight:  Fair  Psychomotor Activity:  Normal  Concentration:  Good  Recall:  Good  Fund of Knowledge:Good  Language: Good  Akathisia:  No  Handed:  Right  AIMS (if indicated):     Assets:  Communication Skills Desire for Improvement Physical Health  ADL's:  Intact  Cognition: WNL  Sleep:   fair   Treatment Plan Summary: Daily contact with patient to assess and evaluate symptoms and progress in treatment.   Medication management: -Continue Prozac 15m daily for depression -Continue Remeron 16mQhs for insomnia.  Disposition: No evidence of imminent risk to self or others at present.   Patient does not meet criteria for psychiatric inpatient admission. [facility]  AkCorena PilgrimMD 02/01/2016 2:46 PM

## 2016-02-01 NOTE — Progress Notes (Signed)
Pt refusing telemetry currently.  Ativan given, portable box obtained, will try to apply and see if pt will tolerate it.  [place] aware, tele monitoring aware.  Will continue to try to reapply once Ativan working. Darryl Parsons

## 2016-02-02 ENCOUNTER — Inpatient Hospital Stay (HOSPITAL_COMMUNITY): Payer: Medicaid Other

## 2016-02-02 LAB — INFLUENZA PANEL BY PCR (TYPE A & B)
H1N1FLUPCR: NOT DETECTED
Influenza A By PCR: NEGATIVE
Influenza B By PCR: POSITIVE — AB

## 2016-02-02 LAB — URINALYSIS, ROUTINE W REFLEX MICROSCOPIC
GLUCOSE, UA: NEGATIVE mg/dL
Hgb urine dipstick: NEGATIVE
KETONES UR: 15 mg/dL — AB
Leukocytes, UA: NEGATIVE
NITRITE: NEGATIVE
PH: 6 (ref 5.0–8.0)
Protein, ur: NEGATIVE mg/dL
Specific Gravity, Urine: 1.024 (ref 1.005–1.030)

## 2016-02-02 MED ORDER — OSELTAMIVIR PHOSPHATE 30 MG PO CAPS
30.0000 mg | ORAL_CAPSULE | Freq: Two times a day (BID) | ORAL | Status: DC
  Administered 2016-02-02 (×2): 30 mg via ORAL
  Filled 2016-02-02 (×4): qty 1

## 2016-02-02 MED ORDER — SALINE SPRAY 0.65 % NA SOLN
1.0000 | NASAL | Status: DC | PRN
  Filled 2016-02-02: qty 44

## 2016-02-02 MED ORDER — LORAZEPAM 0.5 MG PO TABS
0.5000 mg | ORAL_TABLET | Freq: Once | ORAL | Status: DC
  Filled 2016-02-02: qty 1

## 2016-02-02 MED ORDER — BUTALBITAL-APAP-CAFFEINE 50-325-40 MG PO TABS
1.0000 | ORAL_TABLET | ORAL | Status: DC | PRN
  Filled 2016-02-02: qty 1

## 2016-02-02 NOTE — Progress Notes (Signed)
[facility] Progress Note  Patient: Darryl Parsons ASA [id number]   PCP: No primary care provider on file. DOB: 04-20-1956   DOA: 01/30/2016   DOS: 02/02/2016   Date of Service: the patient was seen and examined on 02/02/2016  Subjective: Refusing blood work. The Complain of the headache. Continues to have fever overnight. No cough no chest pain or shortness of breath and nausea and vomiting and diarrhea. Nutrition: Tolerating oral diet  Brief hospital course: Patient was admitted on 01/30/2016, with complaint of unresponsiveness, was found to have drug overdose with acute kidney injury. Also has persistent fever, and workup suggest influenza positive.  Currently further plan is blood pressure control as well as controlling agitation  Assessment and Plan: 1. Major depressive disorder, recurrent episode, moderate (HCC) Likely secondary to increase use of Seroquel. No focal deficit on examination. Resume Prozac. Patient was placed on Remeron Psychiatry recommends continue current management.  Will discontinue Seroquel on discharge, patient will follow up with psychiatry as an outpatient. Continue monitoring of telemetry due to prolonged QTC.   2. Acute kidney injury on chronic kidney disease. Likely secondary to dehydration. Discontinue nephrotoxic medication at present  ultrasound renal shows chronic medical diseases without any hydronephrosis.  3. Essential hypertension. Hypertensive nephropathy  Blood pressure mildly elevated  continue amlodipine, add hydralazine, discontinue ACE inhibitor  4. History of cocaine abuse. Avoid beta blockers.   Activity: Walking in the room Bowel regimen: last BM 01/31/2016 DVT Prophylaxis: subcutaneous Heparin Nutrition: Regular diet Advance goals of care discussion: full code  HPI: As per the H and P dictated on admission, "60 year old male with history of hypertension, drug abuse (cocaine and THC). He presented to [facility] ED after his wife  called EMS and said patient was unresponsive for about an hour or so. Wife did not know what er husband took but has mentioned even possible methantheline. Pt is not able to provide HPI due to his altered mental status. No respiratory distress. No vomiting. No fevers. Family is not at the bedside to provide details of medical history.   In ED, pt hemodynamically stable. He is obtunded and still not able to give any history. He was given narcan in ED as well and his mental status has not improved. Alcohol level is WNL. UDS is pending. Blood work also showed Cr of 2.06, potassium of 2.3 (supplemented in ED). CXR showed no acute cardiopulmonary disease."  Procedures: None Consultants: Psychiatric Antibiotics: Anti-infectives    Start     Dose/Rate Route Frequency Ordered Stop   02/02/16 1215  oseltamivir (TAMIFLU) capsule 30 mg     30 mg Oral 2 times daily 02/02/16 1140 02/07/16 0959      Family Communication: No family was present at bedside, at the time of interview.   Disposition:  Expected discharge date: 02/03/2016 Barriers to safe discharge: Improvement in renal function   Intake/Output Summary (Last 24 hours) at 02/02/16 1950 Last data filed at 02/02/16 1000  Gross per 24 hour  Intake      3 ml  Output    800 ml  Net   -797 ml   Filed Weights   01/31/16 0500 02/01/16 0442 02/02/16 0500  Weight: 86.6 kg (190 lb 14.7 oz) 86.9 kg (191 lb 9.3 oz) 83.6 kg (184 lb 4.9 oz)   Objective: Physical Exam: Filed Vitals:   02/02/16 0500 02/02/16 0525 02/02/16 0800 02/02/16 1430  BP:    122/66  Pulse:    90  Temp:  100.4  F (38 C) 98.9 F (37.2 C) 98.5 F (36.9 C)  TempSrc:   Oral Oral  Resp:    18  Height:      Weight: 83.6 kg (184 lb 4.9 oz)     SpO2:    98%    General: Appear in mild distress, no Rash; Oral Mucosa moist. Cardiovascular: S1 and S2 Present, no Murmur, no JVD Respiratory: Bilateral Air entry present and Clear to Auscultation, no Crackles, no wheezes Abdomen:  Bowel Sound present, Soft and no tenderness Extremities: no Pedal edema, no calf tenderness  Data Reviewed: CBC:  Recent Labs Lab 01/30/16 1200 01/30/16 1725 01/31/16 0318 02/01/16 0416  WBC 7.2 7.7 9.8 6.9  NEUTROABS  --  6.2  --  4.4  HGB 12.4* 12.2* 12.8* 13.1  HCT 37.3* 36.5* 37.1* 40.0  MCV 85.7 85.7 82.4 87.5  PLT 198 190 208 [id number]   Basic Metabolic Panel:  Recent Labs Lab 01/30/16 1200 01/30/16 1725 01/31/16 0318 01/31/16 1150 02/01/16 0416  NA 137 136 139 140 140  K 2.3* 3.3* 3.2* 3.5 3.4*  CL 103 108 111 112* 112*  CO2 21* 21* 19* 19* 20*  GLUCOSE 157* 113* 94 96 100*  BUN 24* 21* 16 15 14   CREATININE 2.06* 1.65* 1.34* 1.45* 1.72*  CALCIUM 8.6* 8.1* 8.4* 8.6* 8.5*  MG  --  1.7  --   --   --   PHOS  --  2.6  --   --   --    Liver Function Tests:  Recent Labs Lab 01/30/16 1200 01/30/16 1725  AST 28 22  ALT 22 21  ALKPHOS 48 45  BILITOT 1.4* 1.2  PROT 6.6 6.2*  ALBUMIN 3.8 3.4*   No results for input(s): LIPASE, AMYLASE in the last 168 hours.  Recent Labs Lab 01/30/16 1412  AMMONIA 42*    Cardiac Enzymes:  Recent Labs Lab 01/30/16 1200 01/30/16 1725 01/31/16 0318  CKTOTAL 262  --   --   TROPONINI  --  <0.03 <0.03    BNP (last 3 results) No results for input(s): BNP in the last 8760 hours.  CBG: No results for input(s): GLUCAP in the last 168 hours.  Recent Results (from the past 240 hour(s))  MRSA PCR Screening     Status: None   Collection Time: 01/30/16  9:04 PM  Result Value Ref Range Status   MRSA by PCR NEGATIVE NEGATIVE Final    Comment:        The GeneXpert MRSA Assay (FDA approved for NASAL specimens only), is one component of a comprehensive MRSA colonization surveillance program. It is not intended to diagnose MRSA infection nor to guide or monitor treatment for MRSA infections.      Studies: US Renal  02/02/2016  CLINICAL DATA:  Acute kidney injury EXAM: RENAL / URINARY TRACT ULTRASOUND COMPLETE  COMPARISON:  CT abdomen pelvis dated 09/06/2014 FINDINGS: Right Kidney: Length: 10.0 cm. Echogenic renal parenchyma. No mass or hydronephrosis. Left Kidney: Length: 9.3 cm. Echogenic renal parenchyma. No mass or hydronephrosis. Bladder: Within normal limits. Additional comments: Prostatomegaly. IMPRESSION: Echogenic renal parenchyma, suggesting medical renal disease. No hydronephrosis. Electronically Signed   By: Julian Hy M.D.   On: 02/02/2016 14:47   Dg Chest Port 1 View  02/02/2016  CLINICAL DATA:  Fever. EXAM: PORTABLE CHEST 1 VIEW COMPARISON:  01/30/2016 FINDINGS: A single AP portable view of the chest demonstrates no focal airspace consolidation or alveolar edema. The lungs are grossly clear. There is no  large effusion or pneumothorax. Cardiac and mediastinal contours appear unremarkable. IMPRESSION: No active disease. Electronically Signed   By: Andreas Newport M.D.   On: 02/02/2016 05:11     Scheduled Meds: . amLODipine  10 mg Oral Daily  . FLUoxetine  20 mg Oral Daily  . hydrALAZINE  50 mg Oral 3 times per day  . isosorbide mononitrate  30 mg Oral Daily  . mirtazapine  15 mg Oral QHS  . oseltamivir  30 mg Oral BID  . sodium chloride flush  3 mL Intravenous Q12H   Continuous Infusions:   PRN Meds: acetaminophen **OR** acetaminophen, butalbital-acetaminophen-caffeine, guaiFENesin-dextromethorphan, hydrALAZINE, LORazepam, ondansetron **OR** ondansetron (ZOFRAN) IV, sodium chloride  Time spent: 30 minutes  Author: Berle Mull, MD [facility] Pager: [phone] 02/02/2016 7:50 PM  If 7PM-7AM, please contact night-coverage at [email or web address], password [facility]

## 2016-02-02 NOTE — Progress Notes (Addendum)
Patient refused morning labs and telemetry monitoring.

## 2016-02-02 NOTE — Progress Notes (Signed)
Pt continues to refuse tele monitoring. MD aware.

## 2016-02-02 NOTE — Progress Notes (Signed)
Pt spiked a fever of 102.4.  Pt refused blood cultures as well as am labs.  Pt did take tylenol and have a PCXR and give a urine specimen and flu swab specimen.  CXR unremarkable all else pending.  Fever down to 100.4.  Darryl Parsons P

## 2016-02-02 NOTE — Progress Notes (Signed)
No IV sites present on assessment. Patient states he removed them because "they are not necessary". He states he does not want an IV. MD notified.

## 2016-02-03 DIAGNOSIS — R45851 Suicidal ideations: Secondary | ICD-10-CM

## 2016-02-03 LAB — COMPREHENSIVE METABOLIC PANEL
ALBUMIN: 3.7 g/dL (ref 3.5–5.0)
ALK PHOS: 51 U/L (ref 38–126)
ALT: 22 U/L (ref 17–63)
ANION GAP: 10 (ref 5–15)
AST: 24 U/L (ref 15–41)
BUN: 20 mg/dL (ref 6–20)
CALCIUM: 8.6 mg/dL — AB (ref 8.9–10.3)
CO2: 21 mmol/L — AB (ref 22–32)
Chloride: 108 mmol/L (ref 101–111)
Creatinine, Ser: 1.71 mg/dL — ABNORMAL HIGH (ref 0.61–1.24)
GFR calc non Af Amer: 42 mL/min — ABNORMAL LOW (ref >60)
GFR, EST AFRICAN AMERICAN: 49 mL/min — AB (ref >60)
Glucose, Bld: 104 mg/dL — ABNORMAL HIGH (ref 65–99)
Potassium: 3.5 mmol/L (ref 3.5–5.1)
SODIUM: 139 mmol/L (ref 135–145)
TOTAL PROTEIN: 6.9 g/dL (ref 6.5–8.1)
Total Bilirubin: 1.7 mg/dL — ABNORMAL HIGH (ref 0.3–1.2)

## 2016-02-03 LAB — CBC WITH DIFFERENTIAL/PLATELET
BASOS ABS: 0 10*3/uL (ref 0.0–0.1)
BASOS PCT: 0 %
EOS ABS: 0 10*3/uL (ref 0.0–0.7)
EOS PCT: 0 %
HCT: 40.5 % (ref 39.0–52.0)
Hemoglobin: 13.3 g/dL (ref 13.0–17.0)
LYMPHS PCT: 23 %
Lymphs Abs: 1.4 10*3/uL (ref 0.7–4.0)
MCH: 28.4 pg (ref 26.0–34.0)
MCHC: 32.8 g/dL (ref 30.0–36.0)
MCV: 86.4 fL (ref 78.0–100.0)
Monocytes Absolute: 1.1 10*3/uL — ABNORMAL HIGH (ref 0.1–1.0)
Monocytes Relative: 17 %
Neutro Abs: 3.8 10*3/uL (ref 1.7–7.7)
Neutrophils Relative %: 60 %
PLATELETS: 198 10*3/uL (ref 150–400)
RBC: 4.69 MIL/uL (ref 4.22–5.81)
RDW: 14.3 % (ref 11.5–15.5)
WBC: 6.3 10*3/uL (ref 4.0–10.5)

## 2016-02-03 LAB — URINE CULTURE: Culture: NO GROWTH

## 2016-02-03 LAB — SEDIMENTATION RATE: SED RATE: 14 mm/h (ref 0–16)

## 2016-02-03 LAB — C-REACTIVE PROTEIN: CRP: 1.8 mg/dL — AB (ref <1.0)

## 2016-02-03 MED ORDER — OSELTAMIVIR PHOSPHATE 30 MG PO CAPS: 30.0000 mg | ORAL_CAPSULE | Freq: Two times a day (BID) | ORAL | Status: AC

## 2016-02-03 MED ORDER — HYDRALAZINE HCL 50 MG PO TABS: 50.0000 mg | ORAL_TABLET | Freq: Three times a day (TID) | ORAL | Status: DC

## 2016-02-03 MED ORDER — ISOSORBIDE MONONITRATE ER 30 MG PO TB24: 30.0000 mg | ORAL_TABLET | Freq: Every day | ORAL | Status: DC

## 2016-02-03 MED ORDER — HALOPERIDOL LACTATE 5 MG/ML IJ SOLN
5.0000 mg | Freq: Once | INTRAMUSCULAR | Status: DC | PRN
  Filled 2016-02-03: qty 1

## 2016-02-03 MED ORDER — LORAZEPAM 2 MG/ML IJ SOLN
1.0000 mg | Freq: Once | INTRAMUSCULAR | Status: DC | PRN
  Filled 2016-02-03: qty 1

## 2016-02-03 MED ORDER — MIRTAZAPINE 15 MG PO TABS: 15.0000 mg | ORAL_TABLET | Freq: Every day | ORAL | Status: DC

## 2016-02-03 NOTE — Progress Notes (Signed)
Pt upset cause the Dr got him on suicidal precaution coming very disrespectful and throwing stuff on the floor.called Rn notify and the Director went talk to the patient.

## 2016-02-03 NOTE — Progress Notes (Signed)
   LATE ENTRY:     CSW was contacted this a.m. To come and complete IVC paperwork on Pt, as the Pt was trying to leave the hospital after stating that his overdose was intentional and he claimed (To medical MD) that he had active SI. CSW completed paperwork while staff had security bring the Pt back to the medical unit to be evaluated by the covering psychiatrist.   CSW completed IVC paperwork, [facility] notarized, and sent it to the [place] office for completion. According to nursing staff the Pt was served on the unit.   CSW met with Pt at the bedside to see if the Pt was experiencing active SI.  Pt was very agressive and tangential during CSW's conversation with Pt. Pt was focused on CSW's academic degree and was Pt was unable to focus on questions. CSW explained to Pt that he could not leave until he was evaluated by the psychiatrist. Pt then stood up and started beating on his chest stating, "Ain't you or nobody going to tell me I can't leave or I will break their jaw like this...". Pt then began beating himself in the face and pushing his lower jaw sideways. CSW then explained to Pt in a calm voice that CSW was only introducing self and that the Pt would need to sit back down. Pt did take his seat and started jingling his keys stating, "these are my keys and I can go home with them." CSW questioned Pt to see if he would agree meet with the psychiatrist and Pt did agree.    Pt did not confirm of deny SI/HI/AH/VH at the time of this encounter. Pt was unable to focus on the questions. Pt also displayed verbal aggression to staff while on the medical unit.   CSW expressed concerns to staff and interviewing psychiatrist. Pt was cleared by the psychiatrist and released home with AVS having Pt follow-up at [facility] after discharge.  Pt was released from IVC and information was faxed to the [facility] office.   Pt was discharged.  CSW signing off.   Pete Pelt [facility]   [phone]

## 2016-02-03 NOTE — Discharge Summary (Addendum)
[facility] Discharge Summary   Patient: Darryl Parsons GUY:[id number]   PCP: No primary care provider on file. DOB: 1956/01/18   Date of admission: 01/30/2016   Date of discharge: 02/03/2016     Discharge Diagnoses:  Principal Problem:   Major depressive disorder, recurrent episode, moderate ([place]) Active Problems:   Cocaine abuse   Hypokalemia   Acute encephalopathy   CKD (chronic kidney disease) stage 4, GFR 15-29 ml/min (HCC)   Benign essential HTN   Suicidal ideation  Recommendations for Outpatient Follow-up:  1. Establish care with PCP, follow-up with [facility] in 1 week   Follow-up Information    Follow up with [facility]. Schedule an appointment as soon as possible for a visit in 1 week.   Specialty:  [facility] information:   [place] [id number] [phone]       Schedule an appointment as soon as possible for a visit in 1 week to follow up.   Why:  establish care with PCP for hypertension     Diet recommendation: low-salt diet  Activity: The patient is advised to gradually reintroduce usual activities.  Discharge Condition: fair  History of present illness: As per the H and P dictated on admission, "60 year old male with history of hypertension, drug abuse (cocaine and THC). He presented to [facility] ED after his wife called EMS and said patient was unresponsive for about an hour or so. Wife did not know what er husband took but has mentioned even possible methantheline. Pt is not able to provide HPI due to his altered mental status. No respiratory distress. No vomiting. No fevers. Family is not at the bedside to provide details of medical history.   In ED, pt hemodynamically stable. He is obtunded and still not able to give any history. He was given narcan in ED as well and his mental status has not improved. Alcohol level is WNL. UDS is pending. Blood work also showed Cr of 2.06, potassium of 2.3 (supplemented in ED). CXR showed no  acute cardiopulmonary disease."  Hospital Course:  Summary of his active problems in the hospital is as following.  1. Major depressive disorder, recurrent episode, moderate (HCC) Likely secondary to increase use of Seroquel. No focal deficit on examination. Resume Prozac. Patient was placed on Remeron Psychiatry recommends continue current management.  Will discontinue Seroquel on discharge, patient will follow up with psychiatry as an outpatient. Given that the patient has agitation and aggression, mentioned to me that he was trying to harm himself by taking medication for him in to the hospital the patient was IVC, psychiatry was asked to reevaluate the patient. Based on the recommendation from the psychiatry patient was discharged home since the psychiatrist rather the patient was not having any imminent risk to self or others and does not met any criteria for inpatient psychiatric admission. IVC papers were rescinded.  2. Acute kidney injury on chronic kidney disease. Likely secondary to dehydration. Discontinue nephrotoxic medication at present ultrasound renal shows chronic medical diseases without any hydronephrosis.  3. Essential hypertension. Hypertensive nephropathy  Blood pressure mildly elevated continue amlodipine, add hydralazine and Imdur, discontinue ACE inhibitor  4. History of cocaine abuse. Avoid beta blockers.  5.influenza. No fever in last 24 hours. Complete the course for Tamiflu.  All other chronic medical condition were stable during the hospitalization.  Patient was ambulatory without any assistance. On the day of the discharge the patient's vitals remained stable and he was cleared  by psychiatry, and no other acute medical condition were reported by patient. the patient was felt safe to be discharge at home with family.  Procedures and Results:  none   Consultations:  psychiatric  DISCHARGE MEDICATION: Discharge Medication List as of 02/03/2016  12:29 PM    START taking these medications   Details  hydrALAZINE (APRESOLINE) 50 MG tablet Take 1 tablet (50 mg total) by mouth every 8 (eight) hours., Starting 02/03/2016, Until Discontinued, Normal    isosorbide mononitrate (IMDUR) 30 MG 24 hr tablet Take 1 tablet (30 mg total) by mouth daily., Starting 02/03/2016, Until Discontinued, Normal    mirtazapine (REMERON) 15 MG tablet Take 1 tablet (15 mg total) by mouth at bedtime., Starting 02/03/2016, Until Discontinued, Normal    oseltamivir (TAMIFLU) 30 MG capsule Take 1 capsule (30 mg total) by mouth 2 (two) times daily., Starting 02/03/2016, Until Fri 02/06/16, Normal      CONTINUE these medications which have NOT CHANGED   Details  amLODipine (NORVASC) 10 MG tablet Take 10 mg by mouth daily., Until Discontinued, Historical Med    benazepril (LOTENSIN) 10 MG tablet Take 10 mg by mouth daily., Until Discontinued, Historical Med    FLUoxetine (PROZAC) 20 MG capsule Take 20 mg by mouth daily., Until Discontinued, Historical Med      STOP taking these medications     ibuprofen (ADVIL,MOTRIN) 200 MG tablet      QUEtiapine (SEROQUEL) 200 MG tablet        Allergies  Allergen Reactions  . Other     Pt states he is allergic to a blood pressure pill. He is unable to state reaction. He is spelling it adelatt and aderlatt.   Discharge Instructions    Diet 2 gram sodium    Complete by:  As directed      Discharge instructions    Complete by:  As directed   It is important that you read following instructions as well as go over your medication list with RN to help you understand your care after this hospitalization.  Discharge Instructions: Please follow-up with PCP in one week  Please request your primary care physician to go over all Hospital Tests and Procedure/Radiological results at the follow up,  Please get all Hospital records sent to your PCP by signing hospital release before you go home.   Do not drive, operating heavy  machinery, perform activities at heights, swimming or participation in water activities or provide baby sitting services because your were admitted for unresponsive episode and also your are on Pain, Sleep and Anxiety Medications; until you have been seen by Primary Care Physician or psychiatrist and advised to do so again. Do not take more than prescribed Pain, Sleep and Anxiety Medications. You were cared for by a hospitalist during your hospital stay. If you have any questions about your discharge medications or the care you received while you were in the hospital after you are discharged, you can call the unit and ask to speak with the hospitalist on call if the hospitalist that took care of you is not available.  Once you are discharged, your primary care physician will handle any further medical issues. Please note that NO REFILLS for any discharge medications will be authorized once you are discharged, as it is imperative that you return to your primary care physician (or establish a relationship with a primary care physician if you do not have one) for your aftercare needs so that they can reassess your need for  medications and monitor your lab values. You Must read complete instructions/literature along with all the possible adverse reactions/side effects for all the Medicines you take and that have been prescribed to you. Take any new Medicines after you have completely understood and accept all the possible adverse reactions/side effects. Wear Seat belts while driving. If you have smoked or chewed Tobacco in the last 2 yrs please stop smoking and/or stop any Recreational drug use.     Increase activity slowly    Complete by:  As directed           Discharge Exam: Filed Weights   01/31/16 0500 02/01/16 0442 02/02/16 0500  Weight: 86.6 kg (190 lb 14.7 oz) 86.9 kg (191 lb 9.3 oz) 83.6 kg (184 lb 4.9 oz)   Filed Vitals:   02/02/16 2021 02/03/16 0641  BP: 134/68 138/88  Pulse: 90 78    Temp: 99.3 F (37.4 C) 98.3 F (36.8 C)  Resp: 18 16   General: Appear in no distress, no Rash; Oral Mucosa moist. Cardiovascular: S1 and S2 Present, no Murmur, no JVD Respiratory: Bilateral Air entry present and Clear to Auscultation, no Crackles, no wheezes Abdomen: Bowel Sound present, Soft and no tenderness Extremities: no Pedal edema, no calf tenderness Neurology: Grossly no focal neuro deficit.  The results of significant diagnostics from this hospitalization (including imaging, microbiology, ancillary and laboratory) are listed below for reference.    Significant Diagnostic Studies: US Renal  02/02/2016  CLINICAL DATA:  Acute kidney injury EXAM: RENAL / URINARY TRACT ULTRASOUND COMPLETE COMPARISON:  CT abdomen pelvis dated 09/06/2014 FINDINGS: Right Kidney: Length: 10.0 cm. Echogenic renal parenchyma. No mass or hydronephrosis. Left Kidney: Length: 9.3 cm. Echogenic renal parenchyma. No mass or hydronephrosis. Bladder: Within normal limits. Additional comments: Prostatomegaly. IMPRESSION: Echogenic renal parenchyma, suggesting medical renal disease. No hydronephrosis. Electronically Signed   By: Julian Hy M.D.   On: 02/02/2016 14:47   Dg Chest Port 1 View  02/02/2016  CLINICAL DATA:  Fever. EXAM: PORTABLE CHEST 1 VIEW COMPARISON:  01/30/2016 FINDINGS: A single AP portable view of the chest demonstrates no focal airspace consolidation or alveolar edema. The lungs are grossly clear. There is no large effusion or pneumothorax. Cardiac and mediastinal contours appear unremarkable. IMPRESSION: No active disease. Electronically Signed   By: Andreas Newport M.D.   On: 02/02/2016 05:11   Dg Chest Port 1 View  01/30/2016  CLINICAL DATA:  Recent overdose EXAM: PORTABLE CHEST 1 VIEW COMPARISON:  08/24/2012 FINDINGS: The heart size and mediastinal contours are within normal limits. Both lungs are clear. The visualized skeletal structures are unremarkable. IMPRESSION: No active disease.  Electronically Signed   By: Inez Catalina M.D.   On: 01/30/2016 14:03    Microbiology: Recent Results (from the past 240 hour(s))  MRSA PCR Screening     Status: None   Collection Time: 01/30/16  9:04 PM  Result Value Ref Range Status   MRSA by PCR NEGATIVE NEGATIVE Final    Comment:        The GeneXpert MRSA Assay (FDA approved for NASAL specimens only), is one component of a comprehensive MRSA colonization surveillance program. It is not intended to diagnose MRSA infection nor to guide or monitor treatment for MRSA infections.   Culture, Urine     Status: None   Collection Time: 02/02/16  5:15 AM  Result Value Ref Range Status   Specimen Description URINE, RANDOM  Final   Special Requests NONE  Final   Culture  Final    NO GROWTH 1 DAY Performed at [facility]    Report Status 02/03/2016 FINAL  Final     Labs: CBC:  Recent Labs Lab 01/30/16 1200 01/30/16 1725 01/31/16 0318 02/01/16 0416 02/03/16 0610  WBC 7.2 7.7 9.8 6.9 6.3  NEUTROABS  --  6.2  --  4.4 3.8  HGB 12.4* 12.2* 12.8* 13.1 13.3  HCT 37.3* 36.5* 37.1* 40.0 40.5  MCV 85.7 85.7 82.4 87.5 86.4  PLT 198 190 208 194 798   Basic Metabolic Panel:  Recent Labs Lab 01/30/16 1725 01/31/16 0318 01/31/16 1150 02/01/16 0416 02/03/16 0610  NA 136 139 140 140 139  K 3.3* 3.2* 3.5 3.4* 3.5  CL 108 111 112* 112* 108  CO2 21* 19* 19* 20* 21*  GLUCOSE 113* 94 96 100* 104*  BUN 21* '16 15 14 20  ' CREATININE 1.65* 1.34* 1.45* 1.72* 1.71*  CALCIUM 8.1* 8.4* 8.6* 8.5* 8.6*  MG 1.7  --   --   --   --   PHOS 2.6  --   --   --   --    Liver Function Tests:  Recent Labs Lab 01/30/16 1200 01/30/16 1725 02/03/16 0610  AST '28 22 24  ' ALT '22 21 22  ' ALKPHOS 48 45 51  BILITOT 1.4* 1.2 1.7*  PROT 6.6 6.2* 6.9  ALBUMIN 3.8 3.4* 3.7   No results for input(s): LIPASE, AMYLASE in the last 168 hours.  Recent Labs Lab 01/30/16 1412  AMMONIA 42*   Cardiac Enzymes:  Recent Labs Lab 01/30/16 1200  01/30/16 1725 01/31/16 0318  CKTOTAL 262  --   --   TROPONINI  --  <0.03 <0.03   Time spent: 30 minutes  Signed:  Berle Mull  [facility] 02/03/2016   , 7:39 PM

## 2016-02-03 NOTE — Progress Notes (Signed)
[facility] Progress Note  Patient: Darryl Parsons [id number]   PCP: No primary care provider on file. DOB: Mar 20, 1956   DOA: 01/30/2016   DOS: 02/03/2016   Date of Service: the patient was seen and examined on 02/03/2016  Subjective: pt has been abusive to staff this morning. When I walked in he had a family member in the room. Pt told that he will not be discharged on seroquel. Pt was upset. He mentioned to me that "let me tell you why I took it, I took it on purpose." on further clarifying, I asked him did you took it on purpose to harm himself and he mentioned, "perhaps." patient was asked clearly. Try to take the medication to take attempt suicide, and his answer was"lord has given everybody a choice".  Later on the patient was placed on suicide precaution and become agitated after that and tried to leave AMA. The patient has recently attempted to overdose on Seroquel and informed me that he has probably taken medication to harm himself patient was IVC for his own safety, security was called to assist. Psychiatry was called to reevaluate the patient.  Nutrition: Tolerating oral diet  Brief hospital course: Patient was admitted on 01/30/2016, with complaint of unresponsiveness, was found to have drug overdose with acute kidney injury. Also has persistent fever, and workup suggest influenza positive.  Currently further plan is to await psychiatric evaluation for suicidal ideation.  Assessment and Plan: 1. Major depressive disorder, recurrent episode, moderate (HCC) Likely secondary to increase use of Seroquel. No focal deficit on examination. Resume Prozac. Patient was placed on Remeron Psychiatry recommends continue current management.  Will discontinue Seroquel on discharge, patient will follow up with psychiatry as an outpatient. Given that the patient has agitation and aggression, mentioned to me that he was trying to harm himself by taking medication for him in to the hospital  the patient will be IVC, psychiatry will be asked to reevaluate the patient.  2. Acute kidney injury on chronic kidney disease. Likely secondary to dehydration. Discontinue nephrotoxic medication at present ultrasound renal shows chronic medical diseases without any hydronephrosis.  3. Essential hypertension. Hypertensive nephropathy  Blood pressure mildly elevated continue amlodipine, add hydralazine and Imdur, discontinue ACE inhibitor  4. History of cocaine abuse. Avoid beta blockers.   Activity: Walking in the room Bowel regimen: last BM 01/31/2016 DVT Prophylaxis: subcutaneous Heparin Nutrition: Regular diet Advance goals of care discussion: full code  HPI: As per the H and P dictated on admission, "60 year old male with history of hypertension, drug abuse (cocaine and THC). He presented to [facility] ED after his wife called EMS and said patient was unresponsive for about an hour or so. Wife did not know what er husband took but has mentioned even possible methantheline. Pt is not able to provide HPI due to his altered mental status. No respiratory distress. No vomiting. No fevers. Family is not at the bedside to provide details of medical history.   In ED, pt hemodynamically stable. He is obtunded and still not able to give any history. He was given narcan in ED as well and his mental status has not improved. Alcohol level is WNL. UDS is pending. Blood work also showed Cr of 2.06, potassium of 2.3 (supplemented in ED). CXR showed no acute cardiopulmonary disease."  Procedures: None Consultants: Psychiatric Antibiotics: Anti-infectives    Start     Dose/Rate Route Frequency Ordered Stop   02/03/16 0000  oseltamivir (TAMIFLU) 30 MG capsule  30 mg Oral 2 times daily 02/03/16 0824 02/06/16 2359   02/02/16 1215  oseltamivir (TAMIFLU) capsule 30 mg     30 mg Oral 2 times daily 02/02/16 1140 02/07/16 0959      Family Communication: No family was present at bedside, at the time of  interview.   Disposition:  Expected discharge date: 02/03/2016 Barriers to safe discharge: Improvement in renal function   Intake/Output Summary (Last 24 hours) at 02/03/16 1113 Last data filed at 02/03/16 0900  Gross per 24 hour  Intake    360 ml  Output    400 ml  Net    -40 ml   Filed Weights   01/31/16 0500 02/01/16 0442 02/02/16 0500  Weight: 86.6 kg (190 lb 14.7 oz) 86.9 kg (191 lb 9.3 oz) 83.6 kg (184 lb 4.9 oz)   Objective: Physical Exam: Filed Vitals:   02/02/16 0800 02/02/16 1430 02/02/16 2021 02/03/16 0641  BP:  122/66 134/68 138/88  Pulse:  90 90 78  Temp: 98.9 F (37.2 C) 98.5 F (36.9 C) 99.3 F (37.4 C) 98.3 F (36.8 C)  TempSrc: Oral Oral Oral Oral  Resp:  18 18 16   Height:      Weight:      SpO2:  98% 94% 97%    General: Appear in mild distress, no Rash; Oral Mucosa moist. Cardiovascular: S1 and S2 Present, no Murmur, no JVD Respiratory: Bilateral Air entry present and Clear to Auscultation, no Crackles, no wheezes Abdomen: Bowel Sound present, Soft and no tenderness Extremities: no Pedal edema, no calf tenderness  Data Reviewed: CBC:  Recent Labs Lab 01/30/16 1200 01/30/16 1725 01/31/16 0318 02/01/16 0416 02/03/16 0610  WBC 7.2 7.7 9.8 6.9 6.3  NEUTROABS  --  6.2  --  4.4 3.8  HGB 12.4* 12.2* 12.8* 13.1 13.3  HCT 37.3* 36.5* 37.1* 40.0 40.5  MCV 85.7 85.7 82.4 87.5 86.4  PLT 198 190 208 194 [id number]   Basic Metabolic Panel:  Recent Labs Lab 01/30/16 1725 01/31/16 0318 01/31/16 1150 02/01/16 0416 02/03/16 0610  NA 136 139 140 140 139  K 3.3* 3.2* 3.5 3.4* 3.5  CL 108 111 112* 112* 108  CO2 21* 19* 19* 20* 21*  GLUCOSE 113* 94 96 100* 104*  BUN 21* 16 15 14 20   CREATININE 1.65* 1.34* 1.45* 1.72* 1.71*  CALCIUM 8.1* 8.4* 8.6* 8.5* 8.6*  MG 1.7  --   --   --   --   PHOS 2.6  --   --   --   --    Liver Function Tests:  Recent Labs Lab 01/30/16 1200 01/30/16 1725 02/03/16 0610  AST 28 22 24   ALT 22 21 22   ALKPHOS 48 45 51    BILITOT 1.4* 1.2 1.7*  PROT 6.6 6.2* 6.9  ALBUMIN 3.8 3.4* 3.7   No results for input(s): LIPASE, AMYLASE in the last 168 hours.  Recent Labs Lab 01/30/16 1412  AMMONIA 42*    Cardiac Enzymes:  Recent Labs Lab 01/30/16 1200 01/30/16 1725 01/31/16 0318  CKTOTAL 262  --   --   TROPONINI  --  <0.03 <0.03    BNP (last 3 results) No results for input(s): BNP in the last 8760 hours.  CBG: No results for input(s): GLUCAP in the last 168 hours.  Recent Results (from the past 240 hour(s))  MRSA PCR Screening     Status: None   Collection Time: 01/30/16  9:04 PM  Result Value Ref Range Status  MRSA by PCR NEGATIVE NEGATIVE Final    Comment:        The GeneXpert MRSA Assay (FDA approved for NASAL specimens only), is one component of a comprehensive MRSA colonization surveillance program. It is not intended to diagnose MRSA infection nor to guide or monitor treatment for MRSA infections.      Studies: US Renal  02/02/2016  CLINICAL DATA:  Acute kidney injury EXAM: RENAL / URINARY TRACT ULTRASOUND COMPLETE COMPARISON:  CT abdomen pelvis dated 09/06/2014 FINDINGS: Right Kidney: Length: 10.0 cm. Echogenic renal parenchyma. No mass or hydronephrosis. Left Kidney: Length: 9.3 cm. Echogenic renal parenchyma. No mass or hydronephrosis. Bladder: Within normal limits. Additional comments: Prostatomegaly. IMPRESSION: Echogenic renal parenchyma, suggesting medical renal disease. No hydronephrosis. Electronically Signed   By: Julian Hy M.D.   On: 02/02/2016 14:47     Scheduled Meds: . amLODipine  10 mg Oral Daily  . FLUoxetine  20 mg Oral Daily  . hydrALAZINE  50 mg Oral 3 times per day  . isosorbide mononitrate  30 mg Oral Daily  . LORazepam  0.5 mg Oral Once  . mirtazapine  15 mg Oral QHS  . oseltamivir  30 mg Oral BID  . sodium chloride flush  3 mL Intravenous Q12H   Continuous Infusions:   PRN Meds: acetaminophen **OR** acetaminophen,  butalbital-acetaminophen-caffeine, guaiFENesin-dextromethorphan, haloperidol lactate, hydrALAZINE, LORazepam, LORazepam, ondansetron **OR** ondansetron (ZOFRAN) IV, sodium chloride  Time spent: 30 minutes  Author: Berle Mull, MD [facility] Pager: [phone] 02/03/2016 11:13 AM  If 7PM-7AM, please contact night-coverage at [email or web address], password [facility]

## 2016-02-03 NOTE — Progress Notes (Signed)
Pt discharged from the unit. Psych evaluated the pt and is okay with the pt discharging home and following up outpatient psych. IVC papers were served before pt was okay from psych to discharge. IVC was cancelled. Pt was given home medications back that were locked in pharmacy. Pt was given discharge instructions. Pt verbally aggressive at discharge. No questions or concerns at this time.  Tally Mckinnon W Arnel Wymer, RN

## 2016-02-03 NOTE — Consult Note (Signed)
Darryl Parsons   Reason for Parsons: depression, unintentional medication overdose Referring Physician:  Dr. Posey Pronto Patient Identification: Darryl Parsons Darryl Parsons Principal Diagnosis: Major depressive disorder, recurrent episode, moderate ([place]) Diagnosis:   Patient Active Problem List   Diagnosis Date Noted  . Major depressive disorder, recurrent episode, moderate ([place]) [F33.1] 02/01/2016  . Acute encephalopathy [G93.40] 01/30/2016  . CKD (chronic kidney disease) stage 4, GFR 15-29 ml/min (H[facility]) [N18.4] 01/30/2016  . Benign essential HTN [I10] 01/30/2016  . Cocaine abuse [F14.10] 08/21/2012  . Hypokalemia [E87.6] 08/21/2012    Total Time spent with patient: 60 minutes  Subjective:   Darryl Parsons is a 60 y.o. male patient admitted with unintentional medication overdose.  HPI:  Darryl Parsons is a 60 year old male seen, chart reviewed and case discussed with his fianc who was at bedside for the face-to-face psychiatric consultation and evaluation. This is his second psychiatric evaluation requested since patient made a statement to the physician about suicide intention before taking medication. Patient appeared sitting in a chair and sharing meals with his fiance and calm and cooperative. Patient seems like some attitude when I am walking into his room and his fiance able to redirect him to behave himself. Patient reportedly presented to the [facility] emergency department status post intentional overdose of Seroquel 200 mg 9. Patient reported he took this medication because he is not able to feel relaxed, not able to sleep and hope it will help him to sleep better. Patient denies intention of suicidal or homicidal and has no evidence of psychosis. Patient is also has a history of depression over several years on and off secondary to multiple psychosocial stresses including substance abuse and legal charges. Patient denies recent relapse of drug of abuse.  Patient endorses using cocaine, marijuana and alcohol in the past but does not required rehabilitation services. Patient also reported his mother passed away in 02-08-14 and he could not see her because he was incarcerated. Patient reports that he has been getting increasingly stressed out and overwhelmed since he got out prison in January, 2016, he did 3.5 years for B/E. He is currently on parole till September, 2017. He states that he took 9 tablets of 200 mg of Seroquel so that he can sleep well, denied intentionally trying to harm himself. Patient denies suicidal, homicidal thoughts, psychosis, delusional thinking but still feeling a little depressed. Patient contract for safety, patient has been willing to follow up with outpatient medication management and his finances willing to supervise his medications. Patient has no previous acute psychiatric hospitalization.   Past psychiatric history: He has been seeing a psychiatrist at [facility] who prescribed Prozac for depression and Seroquel 200 mg at bedtime for sleep.   Risk to Self: Is patient at risk for suicide?: No Risk to Others:   Prior Inpatient Therapy:   Prior Outpatient Therapy:    Past Medical History:  Past Medical History  Diagnosis Date  . Hypertension   . Low back pain   . Chronic kidney disease   . Mental disorder    History reviewed. No pertinent past surgical history. Family History:  Family History  Problem Relation Age of Onset  . Cardiomyopathy Sister   . Factor VIII deficiency Neg Hx   . Lupus Sister   . Kidney failure Sister    Family Psychiatric  History: Unknown  Social History:  History  Alcohol Use No     History  Drug Use  . Yes  .  Special: Cocaine    Social History   Social History  . Marital Status: Single    Spouse Name: N/A  . Number of Children: N/A  . Years of Education: N/A   Social History Main Topics  . Smoking status: Current Every Day Smoker -- 0.50 packs/day    Types: Cigarettes  .  Smokeless tobacco: Never Used  . Alcohol Use: No  . Drug Use: Yes    Special: Cocaine  . Sexual Activity: No   Other Topics Concern  . None   Social History Narrative   Additional Social History:    Allergies:   Allergies  Allergen Reactions  . Other     Pt states he is allergic to a blood pressure pill. He is unable to state reaction. He is spelling it adelatt and aderlatt.    Labs:  Results for orders placed or performed during the hospital encounter of 01/30/16 (from the past 48 hour(s))  Influenza panel by PCR (type A & B, H1N1)     Status: Abnormal   Collection Time: 02/02/16  4:25 AM  Result Value Ref Range   Influenza A By PCR NEGATIVE NEGATIVE   Influenza B By PCR POSITIVE (A) NEGATIVE   H1N1 flu by pcr NOT DETECTED NOT DETECTED    Comment:        The Xpert Flu assay (FDA approved for nasal aspirates or washes and nasopharyngeal swab specimens), is intended as an aid in the diagnosis of influenza and should not be used as a sole basis for treatment. Performed at [facility]   Urinalysis, Routine w reflex microscopic (not at [facility])     Status: Abnormal   Collection Time: 02/02/16  5:15 AM  Result Value Ref Range   Color, Urine AMBER (A) YELLOW    Comment: BIOCHEMICALS MAY BE AFFECTED BY COLOR   APPearance CLEAR CLEAR   Specific Gravity, Urine 1.024 1.005 - 1.030   pH 6.0 5.0 - 8.0   Glucose, UA NEGATIVE NEGATIVE mg/dL   Hgb urine dipstick NEGATIVE NEGATIVE   Bilirubin Urine SMALL (A) NEGATIVE   Ketones, ur 15 (A) NEGATIVE mg/dL   Protein, ur NEGATIVE NEGATIVE mg/dL   Nitrite NEGATIVE NEGATIVE   Leukocytes, UA NEGATIVE NEGATIVE    Comment: MICROSCOPIC NOT DONE ON URINES WITH NEGATIVE PROTEIN, BLOOD, LEUKOCYTES, NITRITE, OR GLUCOSE <1000 mg/dL.  Comprehensive metabolic panel     Status: Abnormal   Collection Time: 02/03/16  6:10 AM  Result Value Ref Range   Sodium 139 135 - 145 mmol/L   Potassium 3.5 3.5 - 5.1 mmol/L   Chloride 108 101 - 111  mmol/L   CO2 21 (L) 22 - 32 mmol/L   Glucose, Bld 104 (H) 65 - 99 mg/dL   BUN 20 6 - 20 mg/dL   Creatinine, Ser 1.71 (H) 0.61 - 1.24 mg/dL   Calcium 8.6 (L) 8.9 - 10.3 mg/dL   Total Protein 6.9 6.5 - 8.1 g/dL   Albumin 3.7 3.5 - 5.0 g/dL   AST 24 15 - 41 U/L   ALT 22 17 - 63 U/L   Alkaline Phosphatase 51 38 - 126 U/L   Total Bilirubin 1.7 (H) 0.3 - 1.2 mg/dL   GFR calc non Af Amer 42 (L) >60 mL/min   GFR calc Af Amer 49 (L) >60 mL/min    Comment: (NOTE) The eGFR has been calculated using the CKD EPI equation. This calculation has not been validated in all clinical situations. eGFR's persistently <60 mL/min  signify possible Chronic Kidney Disease.    Anion gap 10 5 - 15  Sedimentation rate     Status: None   Collection Time: 02/03/16  6:10 AM  Result Value Ref Range   Sed Rate 14 0 - 16 mm/hr  C-reactive protein     Status: Abnormal   Collection Time: 02/03/16  6:10 AM  Result Value Ref Range   CRP 1.8 (H) <1.0 mg/dL    Comment: Performed at [facility]  CBC with Differential/Platelet     Status: Abnormal   Collection Time: 02/03/16  6:10 AM  Result Value Ref Range   WBC 6.3 4.0 - 10.5 K/uL   RBC 4.69 4.22 - 5.81 MIL/uL   Hemoglobin 13.3 13.0 - 17.0 g/dL   HCT 40.5 39.0 - 52.0 %   MCV 86.4 78.0 - 100.0 fL   MCH 28.4 26.0 - 34.0 pg   MCHC 32.8 30.0 - 36.0 g/dL   RDW 14.3 11.5 - 15.5 %   Platelets 198 150 - 400 K/uL   Neutrophils Relative % 60 %   Neutro Abs 3.8 1.7 - 7.7 K/uL   Lymphocytes Relative 23 %   Lymphs Abs 1.4 0.7 - 4.0 K/uL   Monocytes Relative 17 %   Monocytes Absolute 1.1 (H) 0.1 - 1.0 K/uL   Eosinophils Relative 0 %   Eosinophils Absolute 0.0 0.0 - 0.7 K/uL   Basophils Relative 0 %   Basophils Absolute 0.0 0.0 - 0.1 K/uL    Current Facility-Administered Medications  Medication Dose Route Frequency Provider Last Rate Last Dose  . acetaminophen (TYLENOL) tablet 650 mg  650 mg Oral Q6H PRN Robbie Lis, MD   650 mg at 02/02/16 0424   Or  .  acetaminophen (TYLENOL) suppository 650 mg  650 mg Rectal Q6H PRN Robbie Lis, MD      . amLODipine (NORVASC) tablet 10 mg  10 mg Oral Daily Lavina Hamman, MD   10 mg at 02/02/16 1036  . butalbital-acetaminophen-caffeine (FIORICET, ESGIC) 50-325-40 MG per tablet 1 tablet  1 tablet Oral Q4H PRN Lavina Hamman, MD      . FLUoxetine (PROZAC) capsule 20 mg  20 mg Oral Daily Lavina Hamman, MD   20 mg at 02/02/16 1036  . guaiFENesin-dextromethorphan (ROBITUSSIN DM) 100-10 MG/5ML syrup 5 mL  5 mL Oral Q4H PRN Dianne Dun, NP   5 mL at 02/02/16 1842  . hydrALAZINE (APRESOLINE) injection 20 mg  20 mg Intravenous Q6H PRN Dianne Dun, NP   20 mg at 02/01/16 0437  . hydrALAZINE (APRESOLINE) tablet 50 mg  50 mg Oral 3 times per day Lavina Hamman, MD   50 mg at 02/03/16 0640  . isosorbide mononitrate (IMDUR) 24 hr tablet 30 mg  30 mg Oral Daily Lavina Hamman, MD   30 mg at 02/02/16 1036  . LORazepam (ATIVAN) injection 0.5 mg  0.5 mg Intravenous Q4H PRN Lavina Hamman, MD   0.5 mg at 02/01/16 1959  . LORazepam (ATIVAN) tablet 0.5 mg  0.5 mg Oral Once Gardiner Barefoot, NP   0.5 mg at 02/02/16 2123  . mirtazapine (REMERON) tablet 15 mg  15 mg Oral QHS Lavina Hamman, MD   15 mg at 02/02/16 2122  . ondansetron (ZOFRAN) tablet 4 mg  4 mg Oral Q6H PRN Robbie Lis, MD   4 mg at 02/01/16 2142   Or  . ondansetron (ZOFRAN) injection 4 mg  4  mg Intravenous Q6H PRN Robbie Lis, MD      . oseltamivir (TAMIFLU) capsule 30 mg  30 mg Oral BID Lavina Hamman, MD   30 mg at 02/02/16 2057  . sodium chloride (OCEAN) 0.65 % nasal spray 1 spray  1 spray Each Nare PRN Lavina Hamman, MD      . sodium chloride flush (NS) 0.9 % injection 3 mL  3 mL Intravenous Q12H Robbie Lis, MD   3 mL at 02/02/16 2127    Musculoskeletal: Strength & Muscle Tone: within normal limits Gait & Station: normal Patient leans: N/A  Psychiatric Specialty Exam: Review of Systems  Constitutional: Negative.    HENT: Negative.   Eyes: Negative.   Respiratory: Negative.   Cardiovascular: Negative.   Gastrointestinal: Negative.   Genitourinary: Negative.   Musculoskeletal: Negative.   Skin: Negative.   Neurological: Negative.   Endo/Heme/Allergies: Negative.   Psychiatric/Behavioral: Positive for depression. The patient has insomnia.     Blood pressure 138/88, pulse 78, temperature 98.3 F (36.8 C), temperature source Oral, resp. rate 16, height '5\' 9"'  (1.753 m), weight 83.6 kg (184 lb 4.9 oz), SpO2 97 %.Body mass index is 27.2 kg/(m^2).  General Appearance: Casual  Eye Contact::  Good  Speech:  Clear and Coherent  Volume:  Normal  Mood:  Anxious and Depressed  Affect:  Appropriate and Congruent  Thought Process:  Goal Directed  Orientation:  Full (Time, Place, and Person)  Thought Content:  Negative  Suicidal Thoughts:  No  Homicidal Thoughts:  No  Memory:  Immediate;   Good Recent;   Good Remote;   Good  Judgement:  Fair  Insight:  Fair  Psychomotor Activity:  Normal  Concentration:  Good  Recall:  Good  Fund of Knowledge:Good  Language: Good  Akathisia:  No  Handed:  Right  AIMS (if indicated):     Assets:  Communication Skills Desire for Improvement Physical Health  ADL's:  Intact  Cognition: WNL  Sleep:   fair   Treatment Plan Summary: 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   Disposition: No evidence of imminent risk to self or others at present.   Patient does not meet criteria for psychiatric inpatient admission. [facility]  JODurward Parcel MD 02/03/2016 10:56 AM

## 2016-08-08 ENCOUNTER — Emergency Department (HOSPITAL_COMMUNITY)
Admission: EM | Admit: 2016-08-08 | Discharge: 2016-08-08 | Disposition: A | Discharge status: Home / Self Care | Payer: Medicaid Other | Attending: Emergency Medicine | Admitting: Emergency Medicine

## 2016-08-08 ENCOUNTER — Emergency Department (HOSPITAL_COMMUNITY): Payer: Medicaid Other

## 2016-08-08 ENCOUNTER — Encounter (HOSPITAL_COMMUNITY): Payer: Self-pay | Admitting: *Deleted

## 2016-08-08 DIAGNOSIS — N184 Chronic kidney disease, stage 4 (severe): Secondary | ICD-10-CM | POA: Diagnosis not present

## 2016-08-08 DIAGNOSIS — M25552 Pain in left hip: Secondary | ICD-10-CM | POA: Diagnosis not present

## 2016-08-08 DIAGNOSIS — M791 Myalgia: Secondary | ICD-10-CM | POA: Diagnosis not present

## 2016-08-08 DIAGNOSIS — M542 Cervicalgia: Secondary | ICD-10-CM | POA: Diagnosis present

## 2016-08-08 DIAGNOSIS — I129 Hypertensive chronic kidney disease with stage 1 through stage 4 chronic kidney disease, or unspecified chronic kidney disease: Secondary | ICD-10-CM | POA: Insufficient documentation

## 2016-08-08 DIAGNOSIS — F17210 Nicotine dependence, cigarettes, uncomplicated: Secondary | ICD-10-CM | POA: Insufficient documentation

## 2016-08-08 DIAGNOSIS — Z79899 Other long term (current) drug therapy: Secondary | ICD-10-CM | POA: Diagnosis not present

## 2016-08-08 DIAGNOSIS — M7918 Myalgia, other site: Secondary | ICD-10-CM

## 2016-08-08 MED ORDER — ACETAMINOPHEN 325 MG PO TABS
650.0000 mg | ORAL_TABLET | Freq: Once | ORAL | Status: DC
  Filled 2016-08-08: qty 2

## 2016-08-08 MED ORDER — CYCLOBENZAPRINE HCL 10 MG PO TABS
5.0000 mg | ORAL_TABLET | Freq: Once | ORAL | Status: AC
  Administered 2016-08-08: 5 mg via ORAL
  Filled 2016-08-08: qty 1

## 2016-08-08 MED ORDER — IBUPROFEN 600 MG PO TABS: 600.0000 mg | ORAL_TABLET | Freq: Three times a day (TID) | ORAL | 0 refills | Status: DC | PRN

## 2016-08-08 MED ORDER — IBUPROFEN 800 MG PO TABS
800.0000 mg | ORAL_TABLET | Freq: Once | ORAL | Status: AC
  Administered 2016-08-08: 800 mg via ORAL
  Filled 2016-08-08: qty 1

## 2016-08-08 MED ORDER — METHOCARBAMOL 500 MG PO TABS: 500.0000 mg | ORAL_TABLET | Freq: Two times a day (BID) | ORAL | 0 refills | Status: DC

## 2016-08-08 NOTE — Discharge Instructions (Signed)
Read the information below.  Your x-ray did not show any acute abnormality. You do have some degenerative changes in your right shoulder joint, which could be arthritis.  I have prescribed ibuprofen and flexeril for relief. Please take as directed. You can apply heat or ice to affected region for 20 minute increments.  It is very important that you follow up with a primary doctor for a full physical. You have been provided resources, please call on Monday.  I have also provided contact information for [facility] to help establish care regarding your medications.   Use the prescribed medication as directed.  Please discuss all new medications with your pharmacist.   You may return to the Emergency Department at any time for worsening condition or any new symptoms that concern you. Return to ED if you develop fever, chest pain, shortness of breath, abdominal pain.

## 2016-08-08 NOTE — Care Management (Signed)
CM spoke to patient in the ED. Provided patient with a list of clinics to establish himself with a PCP. Patient states he has Medicaid. He is considering call the [facility] for an appointment. Presenter, broadcasting BSN CCM

## 2016-08-08 NOTE — ED Triage Notes (Signed)
Pt presents with neck pain that has been going for 4 to 5 years now.  Pt has not had it checked out and would like to see if anything is wrong.  Pt reports pain is on the right side of his neck and there is "knot" there.  Pt reports that the pain is so severe that he has to turn his whole body to look right. Rates pain a 7 out of 10.  Pt also reports intermittent pain in his left hip. Pt denies recent injuries.

## 2016-08-08 NOTE — ED Provider Notes (Signed)
[facility] DEPT Provider Note   CSN: [id number] Arrival date & time: 08/08/16  1304  By signing my name below, I, Royce Macadamia, attest that this documentation has been prepared under the direction and in the presence of  Gay Filler, PA-C. Electronically Signed: Royce Macadamia, ED Scribe. 08/08/16. 10:23 PM.  History   Chief Complaint Chief Complaint  Patient presents with  . Neck Pain   The history is provided by the patient and medical records. No language interpreter was used.    HPI Comments:  Darryl Parsons is a 60 y.o. male with who presents to the Emergency Department complaining of gradually worsening chronic right sided neck pain for the last 4 years.  Pt reports a knot on the right side of his neck in the trapezius muscle and states his pain is worse when turning his neck.  He states his pain is constant, cramping and radiates to his head, his neck, his upper back, anterior chest wall, and right shoulder. He states it feels like "when you get a crick in your neck." Pt has tried nothing for his pain. He denies difficulty breathing, coughing, vomiting, dysuria, chest pain, nausea, vomiting, abdominal pain, rectal pain, fever, recent blurry vision, difficulty swallowing, bowel and bladder incontinence, numbness, or weakness.  Pt also denies hx of trauma to the site recently or at the onset of his pain, IV drug use, and cancer. He also complains of pain in his coccyx and his left hip which has been present for years. Pt is a poor historian and does not answer questions directly.  He is insistent that a muscle relaxant and heat pad will not help. His thought process is tangential and requires constant redirection.   Past Medical History:  Diagnosis Date  . Chronic kidney disease   . Headache   . Hypertension   . Low back pain   . Mental disorder     Patient Active Problem List   Diagnosis Date Noted  . Suicidal ideation 02/03/2016  . Major depressive disorder,  recurrent episode, moderate ([place]) 02/01/2016  . Acute encephalopathy 01/30/2016  . CKD (chronic kidney disease) stage 4, GFR 15-29 ml/min (HCC) 01/30/2016  . Benign essential HTN 01/30/2016  . Cocaine abuse 08/21/2012  . Hypokalemia 08/21/2012    History reviewed. No pertinent surgical history.     Home Medications    Prior to Admission medications   Medication Sig Start Date End Date Taking? Authorizing Provider  amLODipine (NORVASC) 10 MG tablet Take 10 mg by mouth daily.    Historical Provider, MD  benazepril (LOTENSIN) 10 MG tablet Take 10 mg by mouth daily.    Historical Provider, MD  FLUoxetine (PROZAC) 20 MG capsule Take 20 mg by mouth daily.    Historical Provider, MD  hydrALAZINE (APRESOLINE) 50 MG tablet Take 1 tablet (50 mg total) by mouth every 8 (eight) hours. 02/03/16   Lavina Hamman, MD  isosorbide mononitrate (IMDUR) 30 MG 24 hr tablet Take 1 tablet (30 mg total) by mouth daily. 02/03/16   Lavina Hamman, MD  methocarbamol (ROBAXIN) 500 MG tablet Take 1 tablet (500 mg total) by mouth 2 (two) times daily. 08/08/16   Roxanna Mew, PA-C  mirtazapine (REMERON) 15 MG tablet Take 1 tablet (15 mg total) by mouth at bedtime. 02/03/16   Lavina Hamman, MD    Family History Family History  Problem Relation Age of Onset  . Cardiomyopathy Sister   . Lupus Sister   . Kidney failure Sister   .  Factor VIII deficiency Neg Hx     Social History Social History  Substance Use Topics  . Smoking status: Current Every Day Smoker    Packs/day: 0.50    Types: Cigarettes  . Smokeless tobacco: Never Used  . Alcohol use Yes     Comment: 1-2 beers a month     Allergies   Other   Review of Systems Review of Systems  Constitutional: Negative for fever.  HENT: Negative for trouble swallowing.   Eyes: Negative for visual disturbance.  Respiratory: Negative for cough and shortness of breath.   Cardiovascular: Negative for chest pain.  Gastrointestinal: Negative for  abdominal pain, nausea, rectal pain and vomiting.  Genitourinary: Negative for dysuria and hematuria.  Musculoskeletal: Positive for arthralgias, myalgias and neck pain. Negative for back pain and gait problem.  Skin: Negative for color change.  Neurological: Negative for weakness and numbness.     Physical Exam Updated Vital Signs BP (!) 148/119 (BP Location: Right Arm)   Pulse 60   Temp 97.6 F (36.4 C) (Oral)   Resp 18   SpO2 99%   Physical Exam  Constitutional: He appears well-developed and well-nourished. No distress.  HENT:  Head: Normocephalic and atraumatic.  Mouth/Throat: Oropharynx is clear and moist. No oropharyngeal exudate.  Eyes: Conjunctivae and EOM are normal. Pupils are equal, round, and reactive to light. Right eye exhibits no discharge. Left eye exhibits no discharge. No scleral icterus.  Neck: Normal range of motion and phonation normal. Neck supple. Muscular tenderness present. No spinous process tenderness present. No neck rigidity. Normal range of motion present.  No midline spinal tenderness. 2cm rubbery, smooth, round, mildly tender area in right trapezius. No central punctate. No redness, warmth, area of fluctuance. Neck ROM intact.   Cardiovascular: Normal rate, regular rhythm, normal heart sounds and intact distal pulses.   No murmur heard. Pulmonary/Chest: Effort normal and breath sounds normal. No stridor. No respiratory distress. He has no wheezes. He has no rales.  Mild TTP of upper anterior chest wall and right axillary region.   Abdominal: Soft. Bowel sounds are normal. He exhibits no distension. There is no tenderness. There is no rigidity, no rebound, no guarding and no CVA tenderness.  Musculoskeletal: Normal range of motion.  No midline spinal tenderness. No TTP of sacrum. Mild TTP of left greater trochanter. No redness, warmth, swelling appreciated. Pt is ambulatory.   Lymphadenopathy:    He has no cervical adenopathy.  Neurological: He is  alert. He is not disoriented. He displays no tremor and normal reflexes. Coordination and gait normal. GCS eye subscore is 4. GCS verbal subscore is 5. GCS motor subscore is 6.  Mental Status:  Alert, thought content appropriate, Parsons to give a coherent history. Speech fluent without evidence of aphasia. Parsons to follow 2 step commands without difficulty.  Cranial Nerves:  II:  pupils equal, round, reactive to light III,IV, VI: ptosis not present, extra-ocular motions intact bilaterally  V,VII: smile symmetric, facial light touch sensation equal VIII: hearing grossly normal to voice  X: uvula elevates symmetrically  XI: bilateral shoulder shrug symmetric and strong XII: midline tongue extension without fassiculations Motor:  Normal tone. 5/5 in upper and lower extremities bilaterally including strong and equal grip strength and dorsiflexion/plantar flexion Sensory: light touch normal in all extremities. Cerebellar: normal finger-to-nose with bilateral upper extremities Gait: normal gait and balance CV: distal pulses palpable throughout   Skin: Skin is warm and dry. He is not diaphoretic.  Psychiatric: He has a  normal mood and affect.  Nursing note and vitals reviewed.    ED Treatments / Results   DIAGNOSTIC STUDIES:  Oxygen Saturation is 97% on RA, NML by my interpretation.    COORDINATION OF CARE:  Discussed treatment plan with pt at bedside and pt agreed to plan.  Labs (all labs ordered are listed, but only abnormal results are displayed) Labs Reviewed - No data to display  EKG  EKG Interpretation None       Radiology Dg Ribs Unilateral W/chest Right  Result Date: 08/08/2016 CLINICAL DATA:  Right anterior chest wall pain radiating to the right axillary region. The pain is constant. No known injury. Smoker. EXAM: RIGHT RIBS AND CHEST - 3+ VIEW COMPARISON:  Chest dated 02/02/2016. FINDINGS: Normal sized heart. Mildly tortuous aorta. Clear lungs. Minimal central  peribronchial thickening. Interval loose body in the right subcoracoid bursa. Previously demonstrated right shoulder degenerative changes. No rib fracture or pneumothorax. IMPRESSION: 1. No acute abnormality.  Fatigue BUN 2. Minimal chronic bronchitic changes. 3. Right shoulder degenerative changes. Electronically Signed   By: Claudie Revering M.D.   On: 08/08/2016 15:02   Dg Hip Unilat With Pelvis 2-3 Views Left  Result Date: 08/08/2016 CLINICAL DATA:  Left lateral hip pain.  No known injury. EXAM: DG HIP (WITH OR WITHOUT PELVIS) 2-3V LEFT COMPARISON:  None. FINDINGS: There is no evidence of hip fracture or dislocation. There is no evidence of arthropathy or other focal bone abnormality. IMPRESSION: Normal examination. Electronically Signed   By: Claudie Revering M.D.   On: 08/08/2016 15:02    Procedures Procedures (including critical care time)  Medications Ordered in ED Medications  cyclobenzaprine (FLEXERIL) tablet 5 mg (5 mg Oral Given 08/08/16 1457)  ibuprofen (ADVIL,MOTRIN) tablet 800 mg (800 mg Oral Given 08/08/16 1505)     Initial Impression / Assessment and Plan / ED Course  I have reviewed the triage vital signs and the nursing notes.  Pertinent labs & imaging results that were available during my care of the patient were reviewed by me and considered in my medical decision making (see chart for details).  Clinical Course  Value Comment By Time  DG Hip Unilat With Pelvis 2-3 Views Left No obvious fracture or dislocation.  Roxanna Mew, [place] 09/24 1530  DG Ribs Unilateral W/Chest Right Normal cardiac silhouette. No evidence of consolidation, effusion, or PTX. No free air under diaphragm. No obvious rib fracture.   Roxanna Mew, [place] 09/25 2224    Patient presents to ED with complaint of chronic "knot" in right trapezius muscle and left hip/coccyx pain. Patient is afebrile and non-toxic appearing in NAD. Vital signs remarkable for elevated BP, otherwise stable. 2cm smooth,  rubbery, round area noted in right trapezius; no central punctate, skin changes, fluctuance, or warmth. Neck ROM intact. No focal neuro deficits on exam. Lungs are clear to auscultation. Low suspicion for abscess, cellulitis, or cyst. CXR w/ribs shows no acute cardiopulmonary abnormality or bony fracture; of note, degenerative changes at right shoulder joint. Suspect MSK (?myofascial trigger point) vs. ?lipoma. Pain managed in ED, pt declined tylenol stating "it doesn't work." Requesting ibuprofen.   Regarding coccyx and hip pain - mild TTP of left hip; no warmth, erythema, swelling, or ecchymosis appreciated. Pt is ambulatory. Strength and sensation intact. 2+ distal pulses. Pt is afebrile, low suspicion for septic joint. Hip x-ray negative for acute fracture or dislocation. Unclear cause - ?arthritis.   Discussed results and plan with pt. Symptomatic management discussed to include  heat or ice and pain management. Rx flexeril. Encouraged pt to follow up with a PCP. Care management consult provided resources for establishing PCP. Return precautions discussed. Pt voiced understanding and is agreeable.   Initially rx for ibuprofen as pt states he takes motrin on occasion for pain relief and that tylenol "doesn't work." However, further review of records/labs after pt gone show CKD. Rx d/c'd, pharmacy called to d/c rx, and pt called; however, pt contact numbers are inaccurate.     Final Clinical Impressions(s) / ED Diagnoses   Final diagnoses:  Hip pain, left  Musculoskeletal pain    New Prescriptions Discharge Medication List as of 08/08/2016  4:19 PM    START taking these medications   Details  methocarbamol (ROBAXIN) 500 MG tablet Take 1 tablet (500 mg total) by mouth 2 (two) times daily., Starting Sun 08/08/2016, Print    ibuprofen (ADVIL,MOTRIN) 600 MG tablet Take 1 tablet (600 mg total) by mouth every 8 (eight) hours as needed., Starting Sun 08/08/2016, Print       I personally performed  the services described in this documentation, which was scribed in my presence. The recorded information has been reviewed and is accurate.     Roxanna Mew, PA-C 08/11/16 2226    Charlesetta Shanks, MD 08/11/16 [phone]

## 2016-08-11 ENCOUNTER — Emergency Department (HOSPITAL_COMMUNITY)
Admission: EM | Admit: 2016-08-11 | Discharge: 2016-08-11 | Disposition: A | Discharge status: Home / Self Care | Payer: Medicaid Other | Attending: Emergency Medicine | Admitting: Emergency Medicine

## 2016-08-11 ENCOUNTER — Encounter (HOSPITAL_COMMUNITY): Payer: Self-pay | Admitting: *Deleted

## 2016-08-11 DIAGNOSIS — D171 Benign lipomatous neoplasm of skin and subcutaneous tissue of trunk: Secondary | ICD-10-CM | POA: Insufficient documentation

## 2016-08-11 DIAGNOSIS — I129 Hypertensive chronic kidney disease with stage 1 through stage 4 chronic kidney disease, or unspecified chronic kidney disease: Secondary | ICD-10-CM | POA: Insufficient documentation

## 2016-08-11 DIAGNOSIS — Z79899 Other long term (current) drug therapy: Secondary | ICD-10-CM | POA: Diagnosis not present

## 2016-08-11 DIAGNOSIS — F17210 Nicotine dependence, cigarettes, uncomplicated: Secondary | ICD-10-CM | POA: Diagnosis not present

## 2016-08-11 DIAGNOSIS — N184 Chronic kidney disease, stage 4 (severe): Secondary | ICD-10-CM | POA: Insufficient documentation

## 2016-08-11 DIAGNOSIS — R1011 Right upper quadrant pain: Secondary | ICD-10-CM | POA: Diagnosis present

## 2016-08-11 DIAGNOSIS — F1490 Cocaine use, unspecified, uncomplicated: Secondary | ICD-10-CM | POA: Insufficient documentation

## 2016-08-11 HISTORY — DX: Headache: R51

## 2016-08-11 HISTORY — DX: Headache, unspecified: R51.9

## 2016-08-11 NOTE — Discharge Instructions (Signed)
The mass you have is likely a lipoma, which is a benign condition. If it starts growing, gets harder, or worsens, then you may want to see a surgeon for further evaluation of this mass. Use tylenol as needed for pain. Return to the ER for changes or emergent worsening symptoms.

## 2016-08-11 NOTE — ED Triage Notes (Addendum)
Patient presents with a "knot" in his stomach (RUQ) since 2013.  Patient states area is not painful, it is "just sore."  Patient has been evaluated for this before and received no dx.  Patient was seen here for a "knot" in his neck several days ago.  Patient denies changes in urinary or bowel habits.  Patient denies N/V/D and fever.  Patient does not a PCP.

## 2016-08-11 NOTE — ED Provider Notes (Signed)
[facility] DEPT Provider Note   CSN: [id number] Arrival date & time: 08/11/16  1402     History   Chief Complaint Chief Complaint  Patient presents with  . mass on stomach    HPI Darryl Parsons is a 60 y.o. male with a PMHx of CKD 4, HTN, headaches, chronic low back pain, cocaine abuse, and depression, who presents to the ED with complaints of a knot on his right upper quadrant that has been present since 2012 (5 years). He states that the area has not changed, but he has constant soreness that he describes as 4/10 nonradiating soreness over the right upper quadrant not, worse with pressure applied to the area, with no specific treatments tried prior to arrival. He has been seen in the past in the ER for this, chart review reveals a visiting 2013 for similar complaints. He has never followed up for ongoing management of this area. She denies any skin changes, fevers, chills, chest pain, shortness breath, nausea, vomiting, diarrhea, constipation, melena, hematochezia, obstipation, dysuria, hematuria, numbness, tingling, or focal weakness. Denies any recent travel, suspicious food intake, alcohol use, NSAID use, or recent sick contacts. He says he has a similar mass on his R forehead.   The history is provided by the patient and medical records. No language interpreter was used.    Past Medical History:  Diagnosis Date  . Chronic kidney disease   . Headache   . Hypertension   . Low back pain   . Mental disorder     Patient Active Problem List   Diagnosis Date Noted  . Suicidal ideation 02/03/2016  . Major depressive disorder, recurrent episode, moderate ([place]) 02/01/2016  . Acute encephalopathy 01/30/2016  . CKD (chronic kidney disease) stage 4, GFR 15-29 ml/min ([facility]) 01/30/2016  . Benign essential HTN 01/30/2016  . Cocaine abuse 08/21/2012  . Hypokalemia 08/21/2012    History reviewed. No pertinent surgical history.     Home Medications    Prior to Admission  medications   Medication Sig Start Date End Date Taking? Authorizing Provider  amLODipine (NORVASC) 10 MG tablet Take 10 mg by mouth daily.    Historical Provider, MD  benazepril (LOTENSIN) 10 MG tablet Take 10 mg by mouth daily.    Historical Provider, MD  FLUoxetine (PROZAC) 20 MG capsule Take 20 mg by mouth daily.    Historical Provider, MD  hydrALAZINE (APRESOLINE) 50 MG tablet Take 1 tablet (50 mg total) by mouth every 8 (eight) hours. 02/03/16   Lavina Hamman, MD  isosorbide mononitrate (IMDUR) 30 MG 24 hr tablet Take 1 tablet (30 mg total) by mouth daily. 02/03/16   Lavina Hamman, MD  methocarbamol (ROBAXIN) 500 MG tablet Take 1 tablet (500 mg total) by mouth 2 (two) times daily. 08/08/16   Roxanna Mew, PA-C  mirtazapine (REMERON) 15 MG tablet Take 1 tablet (15 mg total) by mouth at bedtime. 02/03/16   Lavina Hamman, MD    Family History Family History  Problem Relation Age of Onset  . Cardiomyopathy Sister   . Lupus Sister   . Kidney failure Sister   . Factor VIII deficiency Neg Hx     Social History Social History  Substance Use Topics  . Smoking status: Current Every Day Smoker    Packs/day: 0.50    Types: Cigarettes  . Smokeless tobacco: Never Used  . Alcohol use Yes     Comment: 1-2 beers a month     Allergies  Other   Review of Systems Review of Systems  Constitutional: Negative for chills and fever.  Respiratory: Negative for shortness of breath.   Cardiovascular: Negative for chest pain.  Gastrointestinal: Positive for abdominal pain (RUQ over a knot on his abdomen). Negative for blood in stool, constipation, diarrhea, nausea and vomiting.  Genitourinary: Negative for dysuria and hematuria.  Musculoskeletal: Negative for arthralgias and myalgias.  Skin: Negative for color change.  Allergic/Immunologic: Negative for immunocompromised state.  Neurological: Negative for weakness and numbness.  Psychiatric/Behavioral: Negative for confusion.   10  Systems reviewed and are negative for acute change except as noted in the HPI.   Physical Exam Updated Vital Signs BP (!) 165/111 (BP Location: Left Arm) Comment: pt did not take HTN meds  Pulse 66   Temp 98.7 F (37.1 C) (Oral)   Resp 15   Ht 5\' 8"  (1.727 m)   Wt 87.1 kg   SpO2 98%   BMI 29.19 kg/m   Physical Exam  Constitutional: He is oriented to person, place, and time. Vital signs are normal. He appears well-developed and well-nourished.  Non-toxic appearance. No distress.  Afebrile, nontoxic, NAD  HENT:  Head: Normocephalic and atraumatic.    Mouth/Throat: Oropharynx is clear and moist and mucous membranes are normal.  Lipoma type mass to R forehead  Eyes: Conjunctivae and EOM are normal. Right eye exhibits no discharge. Left eye exhibits no discharge.  Neck: Normal range of motion. Neck supple.  Cardiovascular: Normal rate, regular rhythm, normal heart sounds and intact distal pulses.  Exam reveals no gallop and no friction rub.   No murmur heard. Pulmonary/Chest: Effort normal and breath sounds normal. No respiratory distress. He has no decreased breath sounds. He has no wheezes. He has no rhonchi. He has no rales.  Abdominal: Soft. Normal appearance and bowel sounds are normal. He exhibits no distension. There is no tenderness. There is no rigidity, no rebound, no guarding, no CVA tenderness, no tenderness at McBurney's point and negative Murphy's sign. No hernia.    Soft, NTND, +BS throughout, no r/g/r, neg murphy's, neg mcburney's, no CVA TTP Small ~2cm lipoma type mass in RUQ, very mildly tender, not solid, no fluctuance or overlying skin changes, not well demarcated, seems most consistent with lipoma and not as much like a cyst or tumor; no hernias  Musculoskeletal: Normal range of motion.  Neurological: He is alert and oriented to person, place, and time. He has normal strength. No sensory deficit.  Skin: Skin is warm, dry and intact. No rash noted.  Psychiatric: He  has a normal mood and affect.  Nursing note and vitals reviewed.    ED Treatments / Results  Labs (all labs ordered are listed, but only abnormal results are displayed) Labs Reviewed - No data to display  EKG  EKG Interpretation None       Radiology No results found.  Procedures Procedures (including critical care time)  Medications Ordered in ED Medications - No data to display   Initial Impression / Assessment and Plan / ED Course  I have reviewed the triage vital signs and the nursing notes.  Pertinent labs & imaging results that were available during my care of the patient were reviewed by me and considered in my medical decision making (see chart for details).  Clinical Course    60 y.o. 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 Review of chart reveals visit in 2013 for similar complaints. Area consistent with lipoma,  not cystic or solid, no overlying skin changes so doubt abscess. Discussed that if this becomes larger or more symptomatic, he could consider removal of the area, surgery f/up given in the event it becomes a problem. Otherwise, tylenol PRN pain. Doubt need for labs/imaging, no abdominal tenderness on exam. I explained the diagnosis and have given explicit precautions to return to the ER including for any other new or worsening symptoms. The patient understands and accepts the medical plan as it's been dictated and I have answered their questions. Discharge instructions concerning home care and prescriptions have been given. The patient is STABLE and is discharged to home in good condition.   Final Clinical Impressions(s) / ED Diagnoses   Final diagnoses:  Lipoma of abdominal wall    New Prescriptions New Prescriptions   No medications on file     Zacarias Pontes, PA-C 08/11/16 1615    Carmin Muskrat, MD 08/11/16 [phone]

## 2016-08-25 ENCOUNTER — Emergency Department (HOSPITAL_COMMUNITY)
Admission: EM | Admit: 2016-08-25 | Discharge: 2016-08-26 | Disposition: A | Discharge status: Other Acute Inpatient Hospital | Payer: Medicaid Other | Attending: Emergency Medicine | Admitting: Emergency Medicine

## 2016-08-25 ENCOUNTER — Encounter (HOSPITAL_COMMUNITY): Payer: Self-pay | Admitting: Emergency Medicine

## 2016-08-25 DIAGNOSIS — N184 Chronic kidney disease, stage 4 (severe): Secondary | ICD-10-CM | POA: Insufficient documentation

## 2016-08-25 DIAGNOSIS — F17210 Nicotine dependence, cigarettes, uncomplicated: Secondary | ICD-10-CM | POA: Diagnosis not present

## 2016-08-25 DIAGNOSIS — R45851 Suicidal ideations: Secondary | ICD-10-CM | POA: Diagnosis not present

## 2016-08-25 DIAGNOSIS — Z79899 Other long term (current) drug therapy: Secondary | ICD-10-CM | POA: Insufficient documentation

## 2016-08-25 DIAGNOSIS — I129 Hypertensive chronic kidney disease with stage 1 through stage 4 chronic kidney disease, or unspecified chronic kidney disease: Secondary | ICD-10-CM | POA: Diagnosis not present

## 2016-08-25 DIAGNOSIS — T43502A Poisoning by unspecified antipsychotics and neuroleptics, intentional self-harm, initial encounter: Secondary | ICD-10-CM | POA: Diagnosis not present

## 2016-08-25 DIAGNOSIS — F332 Major depressive disorder, recurrent severe without psychotic features: Secondary | ICD-10-CM | POA: Diagnosis not present

## 2016-08-25 DIAGNOSIS — T50902A Poisoning by unspecified drugs, medicaments and biological substances, intentional self-harm, initial encounter: Secondary | ICD-10-CM

## 2016-08-25 DIAGNOSIS — T43222A Poisoning by selective serotonin reuptake inhibitors, intentional self-harm, initial encounter: Secondary | ICD-10-CM | POA: Insufficient documentation

## 2016-08-25 LAB — COMPREHENSIVE METABOLIC PANEL
ALBUMIN: 3.9 g/dL (ref 3.5–5.0)
ALK PHOS: 52 U/L (ref 38–126)
ALT: 21 U/L (ref 17–63)
AST: 27 U/L (ref 15–41)
Anion gap: 6 (ref 5–15)
BILIRUBIN TOTAL: 0.8 mg/dL (ref 0.3–1.2)
BUN: 20 mg/dL (ref 6–20)
CALCIUM: 9 mg/dL (ref 8.9–10.3)
CO2: 22 mmol/L (ref 22–32)
CREATININE: 1.85 mg/dL — AB (ref 0.61–1.24)
Chloride: 109 mmol/L (ref 101–111)
GFR calc Af Amer: 45 mL/min — ABNORMAL LOW (ref >60)
GFR calc non Af Amer: 39 mL/min — ABNORMAL LOW (ref >60)
Glucose, Bld: 108 mg/dL — ABNORMAL HIGH (ref 65–99)
Potassium: 3.1 mmol/L — ABNORMAL LOW (ref 3.5–5.1)
SODIUM: 137 mmol/L (ref 135–145)
Total Protein: 6.9 g/dL (ref 6.5–8.1)

## 2016-08-25 LAB — CBC WITH DIFFERENTIAL/PLATELET
BASOS ABS: 0 10*3/uL (ref 0.0–0.1)
BASOS PCT: 0 %
EOS ABS: 0.1 10*3/uL (ref 0.0–0.7)
EOS PCT: 1 %
HCT: 37.4 % — ABNORMAL LOW (ref 39.0–52.0)
Hemoglobin: 12.5 g/dL — ABNORMAL LOW (ref 13.0–17.0)
Lymphocytes Relative: 32 %
Lymphs Abs: 1.9 10*3/uL (ref 0.7–4.0)
MCH: 28.2 pg (ref 26.0–34.0)
MCHC: 33.4 g/dL (ref 30.0–36.0)
MCV: 84.4 fL (ref 78.0–100.0)
Monocytes Absolute: 0.5 10*3/uL (ref 0.1–1.0)
Monocytes Relative: 9 %
Neutro Abs: 3.4 10*3/uL (ref 1.7–7.7)
Neutrophils Relative %: 58 %
PLATELETS: 197 10*3/uL (ref 150–400)
RBC: 4.43 MIL/uL (ref 4.22–5.81)
RDW: 14.2 % (ref 11.5–15.5)
WBC: 5.9 10*3/uL (ref 4.0–10.5)

## 2016-08-25 LAB — SALICYLATE LEVEL

## 2016-08-25 LAB — ETHANOL

## 2016-08-25 LAB — ACETAMINOPHEN LEVEL: Acetaminophen (Tylenol), Serum: <10 ug/mL — ABNORMAL LOW (ref 10–30)

## 2016-08-25 MED ORDER — AMLODIPINE BESYLATE 5 MG PO TABS
10.0000 mg | ORAL_TABLET | Freq: Every day | ORAL | Status: DC
  Administered 2016-08-26: 10 mg via ORAL
  Filled 2016-08-25: qty 2

## 2016-08-25 MED ORDER — POTASSIUM CHLORIDE CRYS ER 20 MEQ PO TBCR
40.0000 meq | EXTENDED_RELEASE_TABLET | Freq: Once | ORAL | Status: AC
  Administered 2016-08-26: 40 meq via ORAL
  Filled 2016-08-25: qty 2

## 2016-08-25 MED ORDER — MIRTAZAPINE 30 MG PO TABS
15.0000 mg | ORAL_TABLET | Freq: Every day | ORAL | Status: DC
  Filled 2016-08-25: qty 1

## 2016-08-25 MED ORDER — SODIUM CHLORIDE 0.9 % IV BOLUS (SEPSIS)
1000.0000 mL | Freq: Once | INTRAVENOUS | Status: AC
  Administered 2016-08-25: 1000 mL via INTRAVENOUS

## 2016-08-25 MED ORDER — HYDROXYZINE HCL 25 MG PO TABS: 25.0000 mg | ORAL_TABLET | Freq: Four times a day (QID) | ORAL | Status: DC | PRN

## 2016-08-25 MED ORDER — BENAZEPRIL HCL 10 MG PO TABS
10.0000 mg | ORAL_TABLET | Freq: Every day | ORAL | Status: DC
  Administered 2016-08-26: 10 mg via ORAL
  Filled 2016-08-25 (×2): qty 1

## 2016-08-25 MED ORDER — HYDRALAZINE HCL 50 MG PO TABS
50.0000 mg | ORAL_TABLET | Freq: Three times a day (TID) | ORAL | Status: DC
  Filled 2016-08-25 (×4): qty 1

## 2016-08-25 MED ORDER — ISOSORBIDE MONONITRATE ER 30 MG PO TB24
30.0000 mg | ORAL_TABLET | Freq: Every day | ORAL | Status: DC
  Administered 2016-08-26: 30 mg via ORAL
  Filled 2016-08-25 (×2): qty 1

## 2016-08-25 NOTE — Progress Notes (Signed)
08/25/16 1404:  LRT introduced self and offered activities, pt declined.   Victorino Sparrow, LRT/CTRS

## 2016-08-25 NOTE — ED Notes (Signed)
Attempted to call report for second time. Asked to call back in 10 minutes.

## 2016-08-25 NOTE — ED Notes (Signed)
Patient has one bag of belongings at the nurses station outside of room 25.

## 2016-08-25 NOTE — BH Assessment (Signed)
Tele Assessment Note   Darryl Parsons is a 60 y.o. male who presents to [facility], bib EMS due to reported OD. Pt was found in a hotel and EMS was told by a bystander that pt took a handful of Seroquel 200 mg tablets as well as a handful of Prozac 20 mg tablets. It was reported that the bottles were half full when he took them and both bottles are now empty.  Pt was extremely somnolent during the assessment and was a poor historian, as a result. Pt was able to indicate that he's been depressed and "really stressed". Writer asked pt several times if he took the pills as a suicide attempt. Initially, pt said "that's what people thinking". Eventually pt reported that he was not trying to kill himself, but was unable to explain what his end goal was with taking the excessive number of pills.   Case discussed with Waylan Boga, DNP, who recommends IP treatment.   Diagnosis: MDD, recurrent episode, severe  Past Medical History:  Past Medical History:  Diagnosis Date  . Chronic kidney disease   . Headache   . Hypertension   . Low back pain   . Mental disorder     History reviewed. No pertinent surgical history.  Family History:  Family History  Problem Relation Age of Onset  . Cardiomyopathy Sister   . Lupus Sister   . Kidney failure Sister   . Factor VIII deficiency Neg Hx     Social History:  reports that he has been smoking Cigarettes.  He has been smoking about 0.50 packs per day. He has never used smokeless tobacco. He reports that he drinks alcohol. He reports that he uses drugs, including Cocaine.  Additional Social History:  Alcohol / Drug Use Pain Medications: see PTA meds Prescriptions: see PTA meds Over the Counter: see PTA meds History of alcohol / drug use?:  (unknown, pt too somnolent to report, UDS not completed)  CIWA: CIWA-Ar BP: 129/83 Pulse Rate: 65 COWS:    PATIENT STRENGTHS: (choose at least two) Capable of independent living Supportive  family/friends  Allergies:  Allergies  Allergen Reactions  . Other     Pt states he is allergic to a blood pressure pill. He is unable to state reaction. He is spelling it adelatt and aderlatt.    Home Medications:  (Not in a hospital admission)  OB/GYN Status:  No LMP for male patient.  General Assessment Data Location of Assessment: [facility] ED TTS Assessment: In system Is this a Tele or Face-to-Face Assessment?: Tele Assessment Is this an Initial Assessment or a Re-assessment for this encounter?: Initial Assessment Marital status: Married Is patient pregnant?: No Pregnancy Status: No Living Arrangements: Non-relatives/Friends Can pt return to current living arrangement?: Yes Admission Status: Voluntary Is patient capable of signing voluntary admission?: Yes Referral Source: Self/Family/Friend Insurance type: Medicaid     Crisis Care Plan Living Arrangements: Non-relatives/Friends Name of Psychiatrist: Warden/ranger  Education Status Is patient currently in school?: No  Risk to self with the past 6 months Suicidal Ideation: No Has patient been a risk to self within the past 6 months prior to admission? : Yes Suicidal Intent: No Has patient had any suicidal intent within the past 6 months prior to admission? : No Is patient at risk for suicide?: Yes Suicidal Plan?: No Has patient had any suicidal plan within the past 6 months prior to admission? : No Access to Means: Yes Specify Access to Suicidal Means: pt has rx medications  What has been your use of drugs/alcohol within the last 12 months?: see above Family Suicide History: Unknown Persecutory voices/beliefs?: No Depression: Yes Substance abuse history and/or treatment for substance abuse?: No Suicide prevention information given to non-admitted patients: Not applicable  Risk to Others within the past 6 months Homicidal Ideation: No Does patient have any lifetime risk of violence toward others beyond the six months  prior to admission? : Unknown Thoughts of Harm to Others: No Current Homicidal Intent: No Current Homicidal Plan: No Access to Homicidal Means: No History of harm to others?: No Assessment of Violence: None Noted Does patient have access to weapons?: No Criminal Charges Pending?: No Does patient have a court date: Yes Court Date: 12/29/16 Is patient on probation?: Unknown  Psychosis Hallucinations: None noted Delusions: None noted  Mental Status Report Appearance/Hygiene: Unremarkable Eye Contact: Poor Motor Activity: Unremarkable Speech: Slow, Incoherent Level of Consciousness: Sleeping Mood: Other (Comment) (UTA) Affect: Unable to Assess Anxiety Level: None Thought Processes: Unable to Assess Judgement: Unable to Assess Obsessive Compulsive Thoughts/Behaviors: Unable to Assess  Cognitive Functioning Concentration: Unable to Assess Memory: Unable to Assess IQ: Average Insight: Unable to Assess Impulse Control: Unable to Assess Vegetative Symptoms: Unable to Assess  ADLScreening [facility] Assessment Services) Patient's cognitive ability adequate to safely complete daily activities?: Yes Patient able to express need for assistance with ADLs?: Yes Independently performs ADLs?: Yes (appropriate for developmental age)  Prior Inpatient Therapy Prior Inpatient Therapy: No  Prior Outpatient Therapy Prior Outpatient Therapy: No Does patient have an ACCT team?: No Does patient have Intensive In-House Services?  : No Does patient have [facility] services? : Yes Does patient have P4CC services?: No  ADL Screening (condition at time of admission) Patient's cognitive ability adequate to safely complete daily activities?: Yes Is the patient deaf or have difficulty hearing?: No Does the patient have difficulty seeing, even when wearing glasses/contacts?: No Does the patient have difficulty concentrating, remembering, or making decisions?: No Patient able to express need for  assistance with ADLs?: Yes Does the patient have difficulty dressing or bathing?: No Independently performs ADLs?: Yes (appropriate for developmental age) Does the patient have difficulty walking or climbing stairs?: No Weakness of Legs: None Weakness of Arms/Hands: None  Home Assistive Devices/Equipment Home Assistive Devices/Equipment: None  Therapy Consults (therapy consults require a physician order) PT Evaluation Needed: No OT Evalulation Needed: No SLP Evaluation Needed: No Abuse/Neglect Assessment (Assessment to be complete while patient is alone) Physical Abuse: Denies Verbal Abuse: Denies Sexual Abuse: Denies Exploitation of patient/patient's resources: Denies Self-Neglect: Denies Values / Beliefs Cultural Requests During Hospitalization: None Spiritual Requests During Hospitalization: None Consults Spiritual Care Consult Needed: No Social Work Consult Needed: No Regulatory affairs officer (For Healthcare) Does patient have an advance directive?: No Would patient like information on creating an advanced directive?: Yes Higher education careers adviser given    Additional Information 1:1 In Past 12 Months?: No CIRT Risk: No Elopement Risk: No Does patient have medical clearance?: Yes     Disposition:  Disposition Initial Assessment Completed for this Encounter: Yes (consulted with Waylan Boga, DNP) Disposition of Patient: Inpatient treatment program Type of inpatient treatment program: Adult  Rexene Edison 08/25/2016 3:16 PM

## 2016-08-25 NOTE — ED Triage Notes (Signed)
Per EMS, Pt from hotel, significant other witnessed pt take a handful of quetiapine tabs, 200 mg each, and fluoxetine caps 20 mg each. Pt denies SI, but sts "I want my problems to go away." Pt A&Ox4. Pt sleeping at this time. Pill bottles empty, per witness, bottles were half full. Pt vomited on scene. Denies pain.

## 2016-08-25 NOTE — Progress Notes (Signed)
Patient is under review at [facility].  CSW will continue to follow up for placement.  Verlon Setting, [facility] Disposition staff 08/25/2016 7:42 PM

## 2016-08-25 NOTE — ED Notes (Signed)
Pt sleeping at present, easily arouseable to verbal stimuli.  No distress noted,calm & cooperative.  Monitoring for safety, Q 15 min checks in effect.

## 2016-08-25 NOTE — ED Notes (Signed)
Poison control called to check on pt. Based on EKG, lack of symptoms, and pt's normal labs, pt's case closed.

## 2016-08-25 NOTE — ED Provider Notes (Signed)
[facility] DEPT Provider Note   CSN: [id number] Arrival date & time: 08/25/16  0751     History   Chief Complaint Chief Complaint  Patient presents with  . Drug Overdose  . Suicidal    HPI Darryl Parsons is a 60 y.o. male.  Patient is a 60 year old male with a history of hypertension, depression, prior cocaine abuse and chronic kidney disease. He presents after an overdose. Patient was brought by EMS from a hotel. An observer states that the patient took a handful of Seroquel, 200 mg tablets and Prozac, 20 mg tablets. Reportedly the bottles were about half full and now are empty. Patient had one episode of vomiting on scene. He's had no further episodes of vomiting. He sleepy but denies any other complaints currently. He states that he took the pills because he was very depressed. He denies any other alcohol or drug use.    Drug Overdose  Pertinent negatives include no chest pain, no abdominal pain, no headaches and no shortness of breath.    Past Medical History:  Diagnosis Date  . Chronic kidney disease   . Headache   . Hypertension   . Low back pain   . Mental disorder     Patient Active Problem List   Diagnosis Date Noted  . Suicidal ideation 02/03/2016  . Major depressive disorder, recurrent episode, moderate ([place]) 02/01/2016  . Acute encephalopathy 01/30/2016  . CKD (chronic kidney disease) stage 4, GFR 15-29 ml/min ([facility]) 01/30/2016  . Benign essential HTN 01/30/2016  . Cocaine abuse 08/21/2012  . Hypokalemia 08/21/2012    History reviewed. No pertinent surgical history.     Home Medications    Prior to Admission medications   Medication Sig Start Date End Date Taking? Authorizing Provider  amLODipine (NORVASC) 10 MG tablet Take 10 mg by mouth daily.    Historical Provider, MD  benazepril (LOTENSIN) 10 MG tablet Take 10 mg by mouth daily.    Historical Provider, MD  FLUoxetine (PROZAC) 20 MG capsule Take 20 mg by mouth daily.    Historical  Provider, MD  hydrALAZINE (APRESOLINE) 50 MG tablet Take 1 tablet (50 mg total) by mouth every 8 (eight) hours. 02/03/16   Lavina Hamman, MD  isosorbide mononitrate (IMDUR) 30 MG 24 hr tablet Take 1 tablet (30 mg total) by mouth daily. 02/03/16   Lavina Hamman, MD  methocarbamol (ROBAXIN) 500 MG tablet Take 1 tablet (500 mg total) by mouth 2 (two) times daily. 08/08/16   Roxanna Mew, PA-C  mirtazapine (REMERON) 15 MG tablet Take 1 tablet (15 mg total) by mouth at bedtime. 02/03/16   Lavina Hamman, MD    Family History Family History  Problem Relation Age of Onset  . Cardiomyopathy Sister   . Lupus Sister   . Kidney failure Sister   . Factor VIII deficiency Neg Hx     Social History Social History  Substance Use Topics  . Smoking status: Current Every Day Smoker    Packs/day: 0.50    Types: Cigarettes  . Smokeless tobacco: Never Used  . Alcohol use Yes     Comment: 1-2 beers a month     Allergies   Other   Review of Systems Review of Systems  Constitutional: Positive for fatigue. Negative for chills, diaphoresis and fever.  HENT: Negative for congestion, rhinorrhea and sneezing.   Eyes: Negative.   Respiratory: Negative for cough, chest tightness and shortness of breath.   Cardiovascular: Negative for chest pain  and leg swelling.  Gastrointestinal: Negative for abdominal pain, blood in stool, diarrhea, nausea and vomiting.  Genitourinary: Negative for difficulty urinating, flank pain, frequency and hematuria.  Musculoskeletal: Negative for arthralgias and back pain.  Skin: Negative for rash.  Neurological: Negative for dizziness, speech difficulty, weakness, numbness and headaches.  Psychiatric/Behavioral: Positive for dysphoric mood and suicidal ideas.     Physical Exam Updated Vital Signs BP 129/83   Pulse 65   Temp 97.9 F (36.6 C) (Oral)   Resp 11   SpO2 100%   Physical Exam  Constitutional: He is oriented to person, place, and time. He appears  well-developed and well-nourished.  HENT:  Head: Normocephalic and atraumatic.  Eyes: Pupils are equal, round, and reactive to light.  Neck: Normal range of motion. Neck supple.  Cardiovascular: Normal rate, regular rhythm and normal heart sounds.   Pulmonary/Chest: Effort normal and breath sounds normal. No respiratory distress. He has no wheezes. He has no rales. He exhibits no tenderness.  Abdominal: Soft. Bowel sounds are normal. There is no tenderness. There is no rebound and no guarding.  Musculoskeletal: Normal range of motion. He exhibits no edema.  Lymphadenopathy:    He has no cervical adenopathy.  Neurological: He is alert and oriented to person, place, and time.  Patient is sleepy but will wake up and answer questions. He is moving all extremities symmetrically. No clonus.  Skin: Skin is warm and dry. No rash noted.  Psychiatric: He has a normal mood and affect.     ED Treatments / Results  Labs (all labs ordered are listed, but only abnormal results are displayed) Labs Reviewed  COMPREHENSIVE METABOLIC PANEL - Abnormal; Notable for the following:       Result Value   Potassium 3.1 (*)    Glucose, Bld 108 (*)    Creatinine, Ser 1.85 (*)    GFR calc non Af Amer 39 (*)    GFR calc Af Amer 45 (*)    All other components within normal limits  CBC WITH DIFFERENTIAL/PLATELET - Abnormal; Notable for the following:    Hemoglobin 12.5 (*)    HCT 37.4 (*)    All other components within normal limits  ACETAMINOPHEN LEVEL - Abnormal; Notable for the following:    Acetaminophen (Tylenol), Serum <10 (*)    All other components within normal limits  ETHANOL  SALICYLATE LEVEL  RAPID URINE DRUG SCREEN, HOSP PERFORMED    EKG  EKG Interpretation  Date/Time:  Wednesday August 25 2016 08:06:03 EDT Ventricular Rate:  71 PR Interval:    QRS Duration: 98 QT Interval:  406 QTC Calculation: 442 R Axis:   -35 Text Interpretation:  Sinus rhythm Probable left atrial enlargement  Left axis deviation Abnormal R-wave progression, early transition Probable anteroseptal infarct, old since last tracing no significant change Confirmed by Dontell Mian  MD, Goldye Tourangeau ([id number]) on 08/25/2016 8:16:13 AM       Radiology No results found.  Procedures Procedures (including critical care time)  Medications Ordered in ED Medications  amLODipine (NORVASC) tablet 10 mg (not administered)  benazepril (LOTENSIN) tablet 10 mg (not administered)  hydrALAZINE (APRESOLINE) tablet 50 mg (not administered)  isosorbide mononitrate (IMDUR) 24 hr tablet 30 mg (not administered)  mirtazapine (REMERON) tablet 15 mg (not administered)  potassium chloride SA (K-DUR,KLOR-CON) CR tablet 40 mEq (not administered)  hydrOXYzine (ATARAX/VISTARIL) tablet 25 mg (not administered)  sodium chloride 0.9 % bolus 1,000 mL (0 mLs Intravenous Stopped 08/25/16 1313)     Initial Impression /  Assessment and Plan / ED Course  I have reviewed the triage vital signs and the nursing notes.  Pertinent labs & imaging results that were available during my care of the patient were reviewed by me and considered in my medical decision making (see chart for details).  Clinical Course    Patient presents after an overdose. He has been monitored here for over 6 hours and is completely alert and oriented. He's having no other visible fax of the overdose. He currently is medically cleared and awaiting TTS evaluation. He has elevation in his creatinine which appears to be chronic. He has some mild hypokalemia and was given a dose of potassium in the ED.  Final Clinical Impressions(s) / ED Diagnoses   Final diagnoses:  Suicidal thoughts  Intentional drug overdose, initial encounter [facility])    New Prescriptions New Prescriptions   No medications on file     Malvin Johns, MD 08/25/16 1447

## 2016-08-25 NOTE — ED Notes (Signed)
Pt's wife called, permission to speak with her about pt info verbally verified to this RN by patient.

## 2016-08-25 NOTE — Progress Notes (Signed)
Patient listed as having Medicaid insurance without a pcp.  Pcp listed on her Medicaid card is located at the [facility].  System updated.

## 2016-08-25 NOTE — ED Notes (Signed)
Bed: XH74 Expected date:  Expected time:  Means of arrival:  Comments: 60 yo M suicidal

## 2016-08-25 NOTE — ED Notes (Signed)
Poison Control called:  - Watch for CNS depression, seizures, QT Prolongation, tachycardia  - Give Benzos for seizure activity  - Check CMP and Acetaminophen level  - Observe for 6 hours, or until return to baseline if there is significant CNS depression  - Do not give geodon or Haldol  - If no QT prolongation can give antiemetics for nausea/vomiting.

## 2016-08-26 ENCOUNTER — Encounter (HOSPITAL_COMMUNITY): Payer: Self-pay | Admitting: *Deleted

## 2016-08-26 ENCOUNTER — Inpatient Hospital Stay (HOSPITAL_COMMUNITY)
Admission: AD | Admit: 2016-08-26 | Discharge: 2016-08-31 | DRG: 885 | Disposition: A | Discharge status: Home / Self Care | Payer: Medicaid Other | Source: Intra-hospital | Attending: Psychiatry | Admitting: Psychiatry

## 2016-08-26 DIAGNOSIS — J449 Chronic obstructive pulmonary disease, unspecified: Secondary | ICD-10-CM | POA: Diagnosis present

## 2016-08-26 DIAGNOSIS — I10 Essential (primary) hypertension: Secondary | ICD-10-CM | POA: Diagnosis not present

## 2016-08-26 DIAGNOSIS — F1410 Cocaine abuse, uncomplicated: Secondary | ICD-10-CM | POA: Diagnosis present

## 2016-08-26 DIAGNOSIS — Z8489 Family history of other specified conditions: Secondary | ICD-10-CM | POA: Diagnosis not present

## 2016-08-26 DIAGNOSIS — M542 Cervicalgia: Secondary | ICD-10-CM | POA: Diagnosis present

## 2016-08-26 DIAGNOSIS — I129 Hypertensive chronic kidney disease with stage 1 through stage 4 chronic kidney disease, or unspecified chronic kidney disease: Secondary | ICD-10-CM | POA: Diagnosis present

## 2016-08-26 DIAGNOSIS — F17210 Nicotine dependence, cigarettes, uncomplicated: Secondary | ICD-10-CM | POA: Diagnosis present

## 2016-08-26 DIAGNOSIS — T43222A Poisoning by selective serotonin reuptake inhibitors, intentional self-harm, initial encounter: Secondary | ICD-10-CM | POA: Diagnosis not present

## 2016-08-26 DIAGNOSIS — Z841 Family history of disorders of kidney and ureter: Secondary | ICD-10-CM

## 2016-08-26 DIAGNOSIS — R45851 Suicidal ideations: Secondary | ICD-10-CM | POA: Diagnosis present

## 2016-08-26 DIAGNOSIS — F332 Major depressive disorder, recurrent severe without psychotic features: Secondary | ICD-10-CM | POA: Diagnosis present

## 2016-08-26 DIAGNOSIS — G4700 Insomnia, unspecified: Secondary | ICD-10-CM | POA: Diagnosis present

## 2016-08-26 DIAGNOSIS — N184 Chronic kidney disease, stage 4 (severe): Secondary | ICD-10-CM | POA: Diagnosis present

## 2016-08-26 DIAGNOSIS — Z818 Family history of other mental and behavioral disorders: Secondary | ICD-10-CM | POA: Diagnosis not present

## 2016-08-26 DIAGNOSIS — G8929 Other chronic pain: Secondary | ICD-10-CM | POA: Diagnosis present

## 2016-08-26 DIAGNOSIS — Z79899 Other long term (current) drug therapy: Secondary | ICD-10-CM

## 2016-08-26 DIAGNOSIS — F411 Generalized anxiety disorder: Secondary | ICD-10-CM | POA: Diagnosis present

## 2016-08-26 DIAGNOSIS — R51 Headache: Secondary | ICD-10-CM | POA: Diagnosis present

## 2016-08-26 DIAGNOSIS — Z888 Allergy status to other drugs, medicaments and biological substances status: Secondary | ICD-10-CM

## 2016-08-26 DIAGNOSIS — F251 Schizoaffective disorder, depressive type: Principal | ICD-10-CM | POA: Diagnosis present

## 2016-08-26 LAB — RAPID URINE DRUG SCREEN, HOSP PERFORMED
Amphetamines: NOT DETECTED
BARBITURATES: NOT DETECTED
BENZODIAZEPINES: NOT DETECTED
Cocaine: POSITIVE — AB
Opiates: NOT DETECTED
Tetrahydrocannabinol: NOT DETECTED

## 2016-08-26 MED ORDER — VITAMIN B-1 100 MG PO TABS
100.0000 mg | ORAL_TABLET | Freq: Every day | ORAL | Status: DC
  Administered 2016-08-27 – 2016-08-30 (×2): 100 mg via ORAL
  Filled 2016-08-26 (×8): qty 1

## 2016-08-26 MED ORDER — ADULT MULTIVITAMIN W/MINERALS CH
1.0000 | ORAL_TABLET | Freq: Every day | ORAL | Status: DC
  Administered 2016-08-26 – 2016-08-30 (×3): 1 via ORAL
  Filled 2016-08-26 (×10): qty 1

## 2016-08-26 MED ORDER — MIRTAZAPINE 15 MG PO TABS
15.0000 mg | ORAL_TABLET | Freq: Every day | ORAL | Status: DC
  Administered 2016-08-26: 15 mg via ORAL
  Filled 2016-08-26 (×3): qty 1

## 2016-08-26 MED ORDER — OLANZAPINE 10 MG IM SOLR: 5.0000 mg | Freq: Three times a day (TID) | INTRAMUSCULAR | Status: DC | PRN

## 2016-08-26 MED ORDER — CHLORDIAZEPOXIDE HCL 25 MG PO CAPS: 25.0000 mg | ORAL_CAPSULE | Freq: Four times a day (QID) | ORAL | Status: DC | PRN

## 2016-08-26 MED ORDER — ONDANSETRON 4 MG PO TBDP: 4.0000 mg | ORAL_TABLET | Freq: Four times a day (QID) | ORAL | Status: AC | PRN

## 2016-08-26 MED ORDER — AMLODIPINE BESYLATE 10 MG PO TABS
10.0000 mg | ORAL_TABLET | Freq: Every day | ORAL | Status: DC
  Administered 2016-08-27 – 2016-08-31 (×5): 10 mg via ORAL
  Filled 2016-08-26 (×3): qty 1
  Filled 2016-08-26: qty 2
  Filled 2016-08-26: qty 1
  Filled 2016-08-26: qty 3
  Filled 2016-08-26 (×2): qty 1

## 2016-08-26 MED ORDER — QUETIAPINE FUMARATE 50 MG PO TABS
50.0000 mg | ORAL_TABLET | Freq: Once | ORAL | Status: AC | PRN
  Administered 2016-08-26: 50 mg via ORAL
  Filled 2016-08-26: qty 1

## 2016-08-26 MED ORDER — THIAMINE HCL 100 MG/ML IJ SOLN
100.0000 mg | Freq: Once | INTRAMUSCULAR | Status: AC
  Administered 2016-08-26: 100 mg via INTRAMUSCULAR
  Filled 2016-08-26: qty 2

## 2016-08-26 MED ORDER — HYDROXYZINE HCL 25 MG PO TABS: 25.0000 mg | ORAL_TABLET | Freq: Four times a day (QID) | ORAL | Status: DC | PRN

## 2016-08-26 MED ORDER — OLANZAPINE 5 MG PO TABS
5.0000 mg | ORAL_TABLET | Freq: Three times a day (TID) | ORAL | Status: DC | PRN
Start: 2016-08-26 — End: 2016-08-31

## 2016-08-26 MED ORDER — BENAZEPRIL HCL 10 MG PO TABS
10.0000 mg | ORAL_TABLET | Freq: Every day | ORAL | Status: DC
  Administered 2016-08-27 – 2016-08-30 (×4): 10 mg via ORAL
  Filled 2016-08-26 (×8): qty 1

## 2016-08-26 MED ORDER — ALUM & MAG HYDROXIDE-SIMETH 200-200-20 MG/5ML PO SUSP: 30.0000 mL | ORAL | Status: DC | PRN

## 2016-08-26 MED ORDER — ISOSORBIDE MONONITRATE ER 30 MG PO TB24
30.0000 mg | ORAL_TABLET | Freq: Every day | ORAL | Status: DC
  Administered 2016-08-30: 30 mg via ORAL
  Filled 2016-08-26 (×7): qty 1

## 2016-08-26 MED ORDER — HYDRALAZINE HCL 25 MG PO TABS
50.0000 mg | ORAL_TABLET | Freq: Three times a day (TID) | ORAL | Status: DC
  Administered 2016-08-26: 50 mg via ORAL
  Filled 2016-08-26: qty 1
  Filled 2016-08-26 (×6): qty 2
  Filled 2016-08-26: qty 1
  Filled 2016-08-26 (×3): qty 2
  Filled 2016-08-26: qty 1
  Filled 2016-08-26 (×5): qty 2

## 2016-08-26 MED ORDER — LOPERAMIDE HCL 2 MG PO CAPS: 2.0000 mg | ORAL_CAPSULE | ORAL | Status: AC | PRN

## 2016-08-26 MED ORDER — ACETAMINOPHEN 325 MG PO TABS: 650.0000 mg | ORAL_TABLET | Freq: Four times a day (QID) | ORAL | Status: DC | PRN

## 2016-08-26 MED ORDER — MAGNESIUM HYDROXIDE 400 MG/5ML PO SUSP: 30.0000 mL | Freq: Every day | ORAL | Status: DC | PRN

## 2016-08-26 MED ORDER — HYDROXYZINE HCL 25 MG PO TABS
25.0000 mg | ORAL_TABLET | Freq: Four times a day (QID) | ORAL | Status: DC | PRN
  Filled 2016-08-26: qty 10

## 2016-08-26 NOTE — Consult Note (Signed)
[facility] Consult   Reason for Consult:  Intentional overdsoe Referring Physician:  EDP Patient Identification: Darryl Parsons MRN:  [id number] Principal Diagnosis: Major depressive disorder, recurrent severe without psychotic features [facility]) Diagnosis:   Patient Active Problem List   Diagnosis Date Noted  . Major depressive disorder, recurrent severe without psychotic features ([place]) [F33.2] 08/26/2016    Priority: High  . Suicidal ideation [R45.851] 02/03/2016  . Acute encephalopathy [G93.40] 01/30/2016  . CKD (chronic kidney disease) stage 4, GFR 15-29 ml/min (HCC) [N18.4] 01/30/2016  . Benign essential HTN [I10] 01/30/2016  . Cocaine abuse [F14.10] 08/21/2012  . Hypokalemia [E87.6] 08/21/2012    Total Time spent with patient: 45 minutes  Subjective:   Darryl Parsons is a 60 y.o. male patient admitted with suicide attempt.  HPI:  60 yo male who presented to the ED after overdosing on Prozac in a suicide attempt.  He has been very stressed and last night used cocaine and took an overdose, past attempts this year.  Depressed today with feelings of hopelessness, helplessness, and worthlessness.  No homicidal ideations, hallucinations, and withdrawal symptoms.  Past Psychiatric History: depression  Risk to Self: Suicidal Ideation: No Suicidal Intent: No Is patient at risk for suicide?: Yes Suicidal Plan?: No Access to Means: Yes Specify Access to Suicidal Means: pt has rx medications What has been your use of drugs/alcohol within the last 12 months?: see above Risk to Others: Homicidal Ideation: No Thoughts of Harm to Others: No Current Homicidal Intent: No Current Homicidal Plan: No Access to Homicidal Means: No History of harm to others?: No Assessment of Violence: None Noted Does patient have access to weapons?: No Criminal Charges Pending?: No Does patient have a court date: Yes Court Date: 12/29/16 Prior Inpatient Therapy: Prior Inpatient Therapy:  No Prior Outpatient Therapy: Prior Outpatient Therapy: No Does patient have an ACCT team?: No Does patient have Intensive In-House Services?  : No Does patient have [facility] services? : Yes Does patient have P4CC services?: No  Past Medical History:  Past Medical History:  Diagnosis Date  . Chronic kidney disease   . Headache   . Hypertension   . Low back pain   . Mental disorder    History reviewed. No pertinent surgical history. Family History:  Family History  Problem Relation Age of Onset  . Cardiomyopathy Sister   . Lupus Sister   . Kidney failure Sister   . Factor VIII deficiency Neg Hx    Family Psychiatric  History: none Social History:  History  Alcohol Use  . Yes    Comment: 1-2 beers a month     History  Drug Use  . Types: Cocaine    Social History   Social History  . Marital status: Single    Spouse name: N/A  . Number of children: N/A  . Years of education: N/A   Social History Main Topics  . Smoking status: Current Every Day Smoker    Packs/day: 0.50    Types: Cigarettes  . Smokeless tobacco: Never Used  . Alcohol use Yes     Comment: 1-2 beers a month  . Drug use:     Types: Cocaine  . Sexual activity: No   Other Topics Concern  . None   Social History Narrative  . None   Additional Social History:    Allergies:   Allergies  Allergen Reactions  . Other     Pt states he is allergic to a blood pressure pill.  He is unable to state reaction. He is spelling it adelatt and aderlatt.    Labs:  Results for orders placed or performed during the hospital encounter of 08/25/16 (from the past 48 hour(s))  Comprehensive metabolic panel     Status: Abnormal   Collection Time: 08/25/16  8:35 AM  Result Value Ref Range   Sodium 137 135 - 145 mmol/L   Potassium 3.1 (L) 3.5 - 5.1 mmol/L   Chloride 109 101 - 111 mmol/L   CO2 22 22 - 32 mmol/L   Glucose, Bld 108 (H) 65 - 99 mg/dL   BUN 20 6 - 20 mg/dL   Creatinine, Ser 1.85 (H) 0.61 - 1.24  mg/dL   Calcium 9.0 8.9 - 10.3 mg/dL   Total Protein 6.9 6.5 - 8.1 g/dL   Albumin 3.9 3.5 - 5.0 g/dL   AST 27 15 - 41 U/L   ALT 21 17 - 63 U/L   Alkaline Phosphatase 52 38 - 126 U/L   Total Bilirubin 0.8 0.3 - 1.2 mg/dL   GFR calc non Af Amer 39 (L) >60 mL/min   GFR calc Af Amer 45 (L) >60 mL/min    Comment: (NOTE) The eGFR has been calculated using the CKD EPI equation. This calculation has not been validated in all clinical situations. eGFR's persistently <60 mL/min signify possible Chronic Kidney Disease.    Anion gap 6 5 - 15  Ethanol     Status: None   Collection Time: 08/25/16  8:35 AM  Result Value Ref Range   Alcohol, Ethyl (B) <5 <5 mg/dL    Comment:        LOWEST DETECTABLE LIMIT FOR SERUM ALCOHOL IS 5 mg/dL FOR MEDICAL PURPOSES ONLY   CBC with Diff     Status: Abnormal   Collection Time: 08/25/16  8:35 AM  Result Value Ref Range   WBC 5.9 4.0 - 10.5 K/uL   RBC 4.43 4.22 - 5.81 MIL/uL   Hemoglobin 12.5 (L) 13.0 - 17.0 g/dL   HCT 37.4 (L) 39.0 - 52.0 %   MCV 84.4 78.0 - 100.0 fL   MCH 28.2 26.0 - 34.0 pg   MCHC 33.4 30.0 - 36.0 g/dL   RDW 14.2 11.5 - 15.5 %   Platelets 197 150 - 400 K/uL   Neutrophils Relative % 58 %   Neutro Abs 3.4 1.7 - 7.7 K/uL   Lymphocytes Relative 32 %   Lymphs Abs 1.9 0.7 - 4.0 K/uL   Monocytes Relative 9 %   Monocytes Absolute 0.5 0.1 - 1.0 K/uL   Eosinophils Relative 1 %   Eosinophils Absolute 0.1 0.0 - 0.7 K/uL   Basophils Relative 0 %   Basophils Absolute 0.0 0.0 - 0.1 K/uL  Salicylate level     Status: None   Collection Time: 08/25/16  8:35 AM  Result Value Ref Range   Salicylate Lvl <7.8 2.8 - 30.0 mg/dL  Acetaminophen level     Status: Abnormal   Collection Time: 08/25/16  8:35 AM  Result Value Ref Range   Acetaminophen (Tylenol), Serum <10 (L) 10 - 30 ug/mL    Comment:        THERAPEUTIC CONCENTRATIONS VARY SIGNIFICANTLY. A RANGE OF 10-30 ug/mL MAY BE AN EFFECTIVE CONCENTRATION FOR MANY PATIENTS. HOWEVER, SOME ARE  BEST TREATED AT CONCENTRATIONS OUTSIDE THIS RANGE. ACETAMINOPHEN CONCENTRATIONS >150 ug/mL AT 4 HOURS AFTER INGESTION AND >50 ug/mL AT 12 HOURS AFTER INGESTION ARE OFTEN ASSOCIATED WITH TOXIC REACTIONS.  Current Facility-Administered Medications  Medication Dose Route Frequency Provider Last Rate Last Dose  . amLODipine (NORVASC) tablet 10 mg  10 mg Oral Daily Malvin Johns, MD   10 mg at 08/26/16 0946  . benazepril (LOTENSIN) tablet 10 mg  10 mg Oral Daily Malvin Johns, MD   10 mg at 08/26/16 0946  . hydrALAZINE (APRESOLINE) tablet 50 mg  50 mg Oral Q8H Malvin Johns, MD   Stopped at 08/25/16 1740  . hydrOXYzine (ATARAX/VISTARIL) tablet 25 mg  25 mg Oral Q6H PRN Patrecia Pour, NP      . isosorbide mononitrate (IMDUR) 24 hr tablet 30 mg  30 mg Oral Daily Malvin Johns, MD   30 mg at 08/26/16 0946  . mirtazapine (REMERON) tablet 15 mg  15 mg Oral QHS Malvin Johns, MD       Current Outpatient Prescriptions  Medication Sig Dispense Refill  . amLODipine (NORVASC) 10 MG tablet Take 10 mg by mouth daily.    . benazepril (LOTENSIN) 10 MG tablet Take 10 mg by mouth daily.    . methocarbamol (ROBAXIN) 500 MG tablet Take 1 tablet (500 mg total) by mouth 2 (two) times daily. (Patient not taking: Reported on 08/25/2016) 10 tablet 0    Musculoskeletal: Strength & Muscle Tone: within normal limits Gait & Station: normal Patient leans: N/A  Psychiatric Specialty Exam: Physical Exam  Constitutional: He is oriented to person, place, and time. He appears well-developed and well-nourished.  HENT:  Head: Normocephalic.  Neck: Normal range of motion.  Respiratory: Effort normal.  Musculoskeletal: Normal range of motion.  Neurological: He is alert and oriented to person, place, and time.  Skin: Skin is warm and dry.  Psychiatric: His speech is normal and behavior is normal. Judgment normal. Cognition and memory are normal. He exhibits a depressed mood. He expresses suicidal ideation. He  expresses suicidal plans.    Review of Systems  Constitutional: Negative.   HENT: Negative.   Eyes: Negative.   Respiratory: Negative.   Cardiovascular: Negative.   Gastrointestinal: Negative.   Genitourinary: Negative.   Musculoskeletal: Negative.   Skin: Negative.   Neurological: Negative.   Endo/Heme/Allergies: Negative.   Psychiatric/Behavioral: Positive for depression, substance abuse and suicidal ideas.    Blood pressure 165/98, pulse (!) 55, temperature 97.8 F (36.6 C), temperature source Oral, resp. rate 16, SpO2 97 %.There is no height or weight on file to calculate BMI.  General Appearance: Disheveled  Eye Contact:  Fair  Speech:  Normal Rate  Volume:  Decreased  Mood:  Depressed  Affect:  Congruent  Thought Process:  Coherent and Descriptions of Associations: Intact  Orientation:  Full (Time, Place, and Person)  Thought Content:  Rumination  Suicidal Thoughts:  Yes.  with intent/plan  Homicidal Thoughts:  No  Memory:  Immediate;   Fair Recent;   Fair Remote;   Fair  Judgement:  Poor  Insight:  Fair  Psychomotor Activity:  Decreased  Concentration:  Concentration: Fair and Attention Span: Fair  Recall:  [employer] of Knowledge:  Fair  Language:  Good  Akathisia:  No  Handed:  Right  AIMS (if indicated):     Assets:  Leisure Time Physical Health Resilience  ADL's:  Intact  Cognition:  WNL  Sleep:        Treatment Plan Summary: Daily contact with patient to assess and evaluate symptoms and progress in treatment, Medication management and Plan major depressive disorder, recurrent, severe without psychosis:  -Crisis stabilization -Medication management:  Medical medications restarted along with Remeron 15 mg at bedtime for sleep and depression, serotonin medication withheld due to overdose -Individual counseling  Disposition: Recommend psychiatric Inpatient admission when medically cleared.  Waylan Boga, NP 08/26/2016 10:30 AM  Patient seen  face-to-face for psychiatric evaluation, chart reviewed and case discussed with the physician extender and developed treatment plan. Reviewed the information documented and agree with the treatment plan. Corena Pilgrim, MD

## 2016-08-26 NOTE — BH Assessment (Signed)
[facility] Assessment Progress Note  Per Corena Pilgrim, MD, this pt requires psychiatric hospitalization at this time.  Ria Comment, RN, [facility] has assigned pt to [facility] Rm [id number].  Pt has signed Voluntary Admission and Consent for Treatment, as well as Consent to Release Information to [facility], his outpatient provider, and a notification call has been placed.  Signed forms have been faxed to [facility].  Pt's nurse, Nena Jordan, has been notified, and agrees to send original paperwork along with pt via [facility], and to call report to [phone].  Jalene Mullet, [facility] Triage Specialist [phone]

## 2016-08-26 NOTE — ED Notes (Signed)
Pt discharged ambulatory with [place] driver.  Pt contracts for safety but continues to be very depressed.  All belongings were sent with pt.

## 2016-08-26 NOTE — Progress Notes (Signed)
Pt attended the [place] group tonight. Pt did not participate, but was engaged throughout [employer]. Clint Bolder 9:46 PM 08/26/16

## 2016-08-26 NOTE — Tx Team (Signed)
Initial Treatment Plan 08/26/2016 4:14 PM Darryl Parsons ZJI:[id number]    PATIENT STRESSORS: Financial difficulties Legal issue Loss of family members Substance abuse   PATIENT STRENGTHS: Motivation for treatment/growth Physical Health Supportive family/friends   PATIENT IDENTIFIED PROBLEMS: At risk for suicide  Substance Abuse  "Depression"  "Anxiety"               DISCHARGE CRITERIA:  Adequate post-discharge living arrangements Improved stabilization in mood, thinking, and/or behavior Motivation to continue treatment in a less acute level of care Need for constant or close observation no longer present Withdrawal symptoms are absent or subacute and managed without 24-hour nursing intervention  PRELIMINARY DISCHARGE PLAN: Attend 12-step recovery group Outpatient therapy Placement in alternative living arrangements  PATIENT/FAMILY INVOLVEMENT: This treatment plan has been presented to and reviewed with the patient, Darryl Parsons.  The patient and family have been given the opportunity to ask questions and make suggestions.  Dustin Flock, RN 08/26/2016, 4:14 PM

## 2016-08-26 NOTE — Progress Notes (Signed)
Admission Note:  60 year old male who presents voluntary, in no acute distress, for the treatment of SI, Substance Abuse, and Depression. Patient appears flat and depressed. Patient was calm and cooperative with admission process. Patient appeared to be responding to internal stimuli.  Patient presents with passive SI and contracts for safety upon admission. Patient reports visual hallucinations reporting that he see's "faces appear in cement floor".  Patient appeared to be looking at the "faces" during admission and states "you'd see them to if I showed you".   Patient denies auditory hallucinations.  Patient was poor historian throughout admission and displayed mild confusion.  Patient reports that his birthday was incorrect on arm band and states "I was born in [birth year] but I am suppose to be 60 turning 61 not 59 turning 81.  Me and my brother are not twins and he's 55 so that can't be true. I don't know why all the hospitals keep putting that".  Patient's DOB on armband was checked against patient's [place], in patient's wallet, and the dates were the same.  Patient reports crack use.  Patient reports prior suicide attempt in "May or June" where he "took a lot of pills".  Patient currently lives in a motel with is "common law wife" and identifies her as his support system.  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  Patient states "I'm not in mental state to do work".  While at [facility], patient would like to work on "Depression" and "Anxiety".  Skin was assessed and found to be clear of any abnormal marks.  Patient searched and no contraband found, POC and unit policies explained and understanding verbalized. Consents obtained.  Patient had no additional questions or  concerns.

## 2016-08-27 ENCOUNTER — Encounter (HOSPITAL_COMMUNITY): Payer: Self-pay | Admitting: Psychiatry

## 2016-08-27 ENCOUNTER — Inpatient Hospital Stay (HOSPITAL_COMMUNITY): Payer: Medicaid Other

## 2016-08-27 DIAGNOSIS — R519 Headache, unspecified: Secondary | ICD-10-CM | POA: Diagnosis present

## 2016-08-27 DIAGNOSIS — Z818 Family history of other mental and behavioral disorders: Secondary | ICD-10-CM

## 2016-08-27 DIAGNOSIS — Z8489 Family history of other specified conditions: Secondary | ICD-10-CM

## 2016-08-27 DIAGNOSIS — R51 Headache: Secondary | ICD-10-CM

## 2016-08-27 DIAGNOSIS — F1410 Cocaine abuse, uncomplicated: Secondary | ICD-10-CM | POA: Clinically undetermined

## 2016-08-27 DIAGNOSIS — I10 Essential (primary) hypertension: Secondary | ICD-10-CM | POA: Diagnosis present

## 2016-08-27 DIAGNOSIS — F251 Schizoaffective disorder, depressive type: Secondary | ICD-10-CM | POA: Clinically undetermined

## 2016-08-27 DIAGNOSIS — R45851 Suicidal ideations: Secondary | ICD-10-CM

## 2016-08-27 HISTORY — DX: Cocaine abuse, uncomplicated: F14.10

## 2016-08-27 MED ORDER — METHOCARBAMOL 500 MG PO TABS
500.0000 mg | ORAL_TABLET | Freq: Three times a day (TID) | ORAL | Status: DC | PRN
  Administered 2016-08-27 – 2016-08-28 (×2): 500 mg via ORAL
  Filled 2016-08-27 (×2): qty 1

## 2016-08-27 MED ORDER — IBUPROFEN 600 MG PO TABS
600.0000 mg | ORAL_TABLET | Freq: Four times a day (QID) | ORAL | Status: DC | PRN
  Administered 2016-08-27 – 2016-08-29 (×3): 600 mg via ORAL
  Filled 2016-08-27 (×3): qty 1

## 2016-08-27 MED ORDER — QUETIAPINE FUMARATE 300 MG PO TABS
300.0000 mg | ORAL_TABLET | Freq: Every day | ORAL | Status: DC
  Administered 2016-08-27 – 2016-08-30 (×4): 300 mg via ORAL
  Filled 2016-08-27: qty 1
  Filled 2016-08-27: qty 3
  Filled 2016-08-27 (×4): qty 1

## 2016-08-27 MED ORDER — SERTRALINE HCL 25 MG PO TABS
25.0000 mg | ORAL_TABLET | Freq: Every day | ORAL | Status: DC
  Administered 2016-08-27 – 2016-08-30 (×4): 25 mg via ORAL
  Filled 2016-08-27 (×6): qty 1

## 2016-08-27 NOTE — H&P (Signed)
Psychiatric Admission Assessment Adult  Patient Identification: Darryl Parsons MRN:  [id number] Date of Evaluation:  08/27/2016 Chief Complaint: Patient states " I am feeling down and feeling hopeless. "  Principal Diagnosis: Schizoaffective disorder, depressive type ([place]) Diagnosis:   Patient Active Problem List   Diagnosis Date Noted  . Schizoaffective disorder, depressive type ([place]) [F25.1] 08/27/2016  . Cocaine use disorder, mild, abuse [F14.10] 08/27/2016  . Essential hypertension [I10] 08/27/2016  . Chronic headache [R51] 08/27/2016  . Suicidal ideation [R45.851] 02/03/2016  . Acute encephalopathy [G93.40] 01/30/2016  . CKD (chronic kidney disease) stage 4, GFR 15-29 ml/min (HCC) [N18.4] 01/30/2016  . Benign essential HTN [I10] 01/30/2016  . Cocaine abuse [F14.10] 08/21/2012  . Hypokalemia [E87.6] 08/21/2012   History of Present Illness: Darryl Parsons is a 60 y.o. AA male who is married , unemployed , who lives with his wife in [place] , who has a hx of  ? Bipolar disorder, depression , who presented to [facility] after a suicide attempt.  Per initial notes in EHR : " Patient is 60 yo male who presented to the ED after overdosing on Prozac and seroquel  in a suicide attempt.  He has been very stressed and last night used cocaine and took an overdose, past attempts this year.  Depressed today with feelings of hopelessness, helplessness, and worthlessness.  No homicidal ideations, hallucinations, and withdrawal symptoms."  Patient seen and chart reviewed today .Discussed patient with treatment team. Patient today is seen as irritable vaguely , depressed and states he has sleep issues , is hopeless and has SI . When asked about plan - he states ' you either do it or you don't." Pt reports he has been seeing faces and calls it a gift that he has had since a child. Pt did not elaborate on the faces , but per initial notes in EHR -(  pt reports visual hallucinations reporting that he see's  "faces appear in cement floor".  Patient appeared to be looking at the "faces" during admission and states "you'd see them to if I showed you" ).  Patient reports sleep issues and states seroquel is the only medication that works for him. Pt reports he is sad , anxious all the time , but states he does have some mood swings. Pt reports that his wife thinks he may have Bipolar - but when asked about his mood swings , pt reports one minute I may think I can do this and feels good , then next minutes I feel sad again and does not feel good about it anymore. Other than this patient is unable to give any hx of manic sx and hence at this time its unlikely that he has Bipolar disorder.   Pt reports that he abuses cocaine 20 $ worth often to help his depressive sx, but patient states he does not call it abuse and that he does it because he is sad. Pt denies abusing alcohol , but to the RN who did his admission to [facility] [address] yesterday patient had mentioned using alcohol on and off and getting drunk at times .  Pt reports that seroquel is the only medication that works for him , he does not want to be on prozac anymore. However he feels that Zoloft worked well in the past and wants to be on it.  Pt reports he is also frustrated due to his chronic headaches and neck pain. Pt reports headaches that is generalized all over his head and goes  down his neck and has been struggling with it for years. Pt reports he is in the process of a getting a PMD to help him since he just got his Medicaid. Pt per review of EHR was seen in ED for neck pain on 08/08/16 and as per EDP - "Suspect MSK (?myofascial trigger point) vs. ?lipoma" - Patient at that time was discharged on Motrin and Robaxin.      Associated Signs/Symptoms: Depression Symptoms:  depressed mood, insomnia, psychomotor retardation, feelings of worthlessness/guilt, hopelessness, recurrent thoughts of death, suicidal thoughts with specific plan, suicidal  attempt, anxiety, disturbed sleep, (Hypo) Manic Symptoms:  Delusions, Grandiosity, Irritable Mood, Anxiety Symptoms:  Excessive Worry, Psychotic Symptoms:  Delusions, PTSD Symptoms: Negative Total Time spent with patient: 45 minutes  Past Psychiatric History: Patient with hx of ? Depression , Bipolar disorder, cocaine abuse , was admitted at [facility] in the past . Pt reports atleast 2-3 suicide attempts - per review of EHR - was admitted to ICU in the past for OD on pills , pt this time also presented after an OD - seroquel and prozac - but was not consistent with his story about why he took the pills the patient follows up with [facility].  Is the patient at risk to self? Yes.    Has the patient been a risk to self in the past 6 months? No.  Has the patient been a risk to self within the distant past? Yes.    Is the patient a risk to others? No.  Has the patient been a risk to others in the past 6 months? No.  Has the patient been a risk to others within the distant past? No.   Prior Inpatient Therapy:   Prior Outpatient Therapy:    Alcohol Screening: 1. How often do you have a drink containing alcohol?: Never 9. Have you or someone else been injured as a result of your drinking?: No 10. Has a relative or friend or a doctor or another health worker been concerned about your drinking or suggested you cut down?: No Alcohol Use Disorder Identification Test Final Score (AUDIT): 0 Brief Intervention: Patient declined brief intervention Substance Abuse History in the last 12 months:  Yes.  cocaine , alcohol ?- unknown how much he abuses. Consequences of Substance Abuse: Negative Previous Psychotropic Medications: Yes prozac Psychological Evaluations: No  Past Medical History:  Past Medical History:  Diagnosis Date  . Chronic kidney disease   . Headache   . Hypertension   . Low back pain   . Mental disorder    History reviewed. No pertinent surgical history. Family History:  Family  History  Problem Relation Age of Onset  . Cardiomyopathy Sister   . Lupus Sister   . Kidney failure Sister   . Factor VIII deficiency Neg Hx   . Mental illness Neg Hx    Family Psychiatric  History: Patient denies any hx of mental illness in his family. Tobacco Screening: Have you used any form of tobacco in the last 30 days? (Cigarettes, Smokeless Tobacco, Cigars, and/or Pipes): Yes Tobacco use, Select all that apply: 5 or more cigarettes per day Are you interested in Tobacco Cessation Medications?: No, patient refused Counseled patient on smoking cessation including recognizing danger situations, developing coping skills and basic information about quitting provided: Refused/Declined practical counseling Social History: Patient is married - and per review of EHR - patient has wife who is also disabled , lives in [place] , has two adult children , got  GED, is unemployed at this time. History  Alcohol Use  . Yes    Comment: 1-2 beers a month     History  Drug Use  . Types: Cocaine    Additional Social History: Marital status: Long term relationship Long term relationship, how long?: Patient has been in a long term relationship with his "common law wife" [name] for 16 years.  What types of issues is patient dealing with in the relationship?: Patient reported that his wife is disabled and he is her aid. Patient also reported that his wife is the "bread winner" and pays for all financial responsibilities.  Are you sexually active?: Yes What is your sexual orientation?: Heterosexual  Has your sexual activity been affected by drugs, alcohol, medication, or emotional stress?: No  Does patient have children?: Yes How many children?: 2 How is patient's relationship with their children?: Patient reported that he has a very distant relationship with his two adult children, both age 75.                         Allergies:   Allergies  Allergen Reactions  . Other     Pt states he is  allergic to a blood pressure pill. He is unable to state reaction. He is spelling it adelatt and aderlatt.   Lab Results:  Results for orders placed or performed during the hospital encounter of 08/25/16 (from the past 48 hour(s))  Urine rapid drug screen (hosp performed)not at [facility]     Status: Abnormal   Collection Time: 08/26/16 11:47 AM  Result Value Ref Range   Opiates NONE DETECTED NONE DETECTED   Cocaine POSITIVE (A) NONE DETECTED   Benzodiazepines NONE DETECTED NONE DETECTED   Amphetamines NONE DETECTED NONE DETECTED   Tetrahydrocannabinol NONE DETECTED NONE DETECTED   Barbiturates NONE DETECTED NONE DETECTED    Comment:        DRUG SCREEN FOR MEDICAL PURPOSES ONLY.  IF CONFIRMATION IS NEEDED FOR ANY PURPOSE, NOTIFY LAB WITHIN 5 DAYS.        LOWEST DETECTABLE LIMITS FOR URINE DRUG SCREEN Drug Class       Cutoff (ng/mL) Amphetamine      1000 Barbiturate      200 Benzodiazepine   237 Tricyclics       628 Opiates          300 Cocaine          300 THC              50     Blood Alcohol level:  Lab Results  Component Value Date   ETH <5 08/25/2016   ETH <5 31/51/7616    Metabolic Disorder Labs:  Lab Results  Component Value Date   HGBA1C <4.3 (L) 01/30/2016   MPG <77 01/30/2016   No results found for: PROLACTIN Lab Results  Component Value Date   CHOL 174 06/28/2008   TRIG 54 06/28/2008   HDL 63 06/28/2008   CHOLHDL 2.8 Ratio 06/28/2008   VLDL 11 06/28/2008   LDLCALC 100 (H) 06/28/2008   LDLCALC  12/11/2007    93        Total Cholesterol/HDL:CHD Risk Coronary Heart Disease Risk Table                     Men   Women  1/2 Average Risk   3.4   3.3    Current Medications: Current Facility-Administered Medications  Medication Dose Route  Frequency Provider Last Rate Last Dose  . acetaminophen (TYLENOL) tablet 650 mg  650 mg Oral Q6H PRN Patrecia Pour, NP      . alum & mag hydroxide-simeth (MAALOX/MYLANTA) 200-200-20 MG/5ML suspension 30 mL  30 mL Oral Q4H  PRN Patrecia Pour, NP      . amLODipine (NORVASC) tablet 10 mg  10 mg Oral Daily Patrecia Pour, NP   10 mg at 08/27/16 0841  . benazepril (LOTENSIN) tablet 10 mg  10 mg Oral Daily Patrecia Pour, NP   10 mg at 08/27/16 0840  . chlordiazePOXIDE (LIBRIUM) capsule 25 mg  25 mg Oral Q6H PRN Ursula Alert, MD      . hydrALAZINE (APRESOLINE) tablet 50 mg  50 mg Oral Q8H Patrecia Pour, NP   50 mg at 08/26/16 2235  . hydrOXYzine (ATARAX/VISTARIL) tablet 25 mg  25 mg Oral Q6H PRN Ursula Alert, MD      . ibuprofen (ADVIL,MOTRIN) tablet 600 mg  600 mg Oral Q6H PRN Kerrie Buffalo, NP      . isosorbide mononitrate (IMDUR) 24 hr tablet 30 mg  30 mg Oral Daily Patrecia Pour, NP      . loperamide (IMODIUM) capsule 2-4 mg  2-4 mg Oral PRN Ursula Alert, MD      . magnesium hydroxide (MILK OF MAGNESIA) suspension 30 mL  30 mL Oral Daily PRN Patrecia Pour, NP      . multivitamin with minerals tablet 1 tablet  1 tablet Oral Daily Ursula Alert, MD   1 tablet at 08/27/16 0845  . OLANZapine (ZYPREXA) tablet 5 mg  5 mg Oral TID PRN Ursula Alert, MD       Or  . OLANZapine (ZYPREXA) injection 5 mg  5 mg Intramuscular TID PRN Ursula Alert, MD      . ondansetron (ZOFRAN-ODT) disintegrating tablet 4 mg  4 mg Oral Q6H PRN Novia Lansberry, MD      . QUEtiapine (SEROQUEL) tablet 300 mg  300 mg Oral QHS Tuan Tippin, MD      . sertraline (ZOLOFT) tablet 25 mg  25 mg Oral Daily Aleaya Latona, MD      . thiamine (VITAMIN B-1) tablet 100 mg  100 mg Oral Daily Fleur Audino, MD   100 mg at 08/27/16 0845   PTA Medications: Prescriptions Prior to Admission  Medication Sig Dispense Refill Last Dose  . amLODipine (NORVASC) 10 MG tablet Take 10 mg by mouth daily.   Past Week at Unknown time  . benazepril (LOTENSIN) 10 MG tablet Take 10 mg by mouth daily.   Past Week at Unknown time  . methocarbamol (ROBAXIN) 500 MG tablet Take 1 tablet (500 mg total) by mouth 2 (two) times daily. (Patient not taking: Reported on  08/26/2016) 10 tablet 0 Not Taking at Unknown time    Musculoskeletal: Strength & Muscle Tone: within normal limits Gait & Station: normal Patient leans: N/A  Psychiatric Specialty Exam: Physical Exam  Nursing note and vitals reviewed.   ROS  Blood pressure 135/77, pulse 75, temperature 98.2 F (36.8 C), temperature source Oral, resp. rate 18, height 5\' 8"  (1.727 m), weight 86.4 kg (190 lb 8 oz), SpO2 100 %.Body mass index is 28.97 kg/m.  General Appearance: Disheveled  Eye Contact:  Fair  Speech:  Normal Rate  Volume:  Decreased  Mood:  Anxious, Depressed and Irritable  Affect:  Depressed  Thought Process:  Goal Directed and Descriptions of Associations: Circumstantial  Orientation:  Full (  Time, Place, and Person)  Thought Content:  Delusions and Rumination  Suicidal Thoughts:  Yes.  Patient states " I do or I don't "  Homicidal Thoughts:  No  Memory:  Immediate;   Fair Recent;   Fair Remote;   Fair  Judgement:  Impaired  Insight:  Lacking  Psychomotor Activity:  Decreased  Concentration:  Concentration: Fair and Attention Span: Fair  Recall:  [employer] of Knowledge:  Fair  Language:  Fair  Akathisia:  No  Handed:  Right  AIMS (if indicated):     Assets:  Desire for Improvement Social Support  ADL's:  Intact  Cognition:  WNL  Sleep:  Number of Hours: 6.75    Treatment Plan Summary:Uzoma T Badie is a 60 y.o. AA male who is married , unemployed , who lives with his wife in [place] , who has a hx of  ? Bipolar disorder, depression , who presented to [facility] after a suicide attempt.  Patient today seen as vaguely irritable , continues to be depressed , has grandiose delusions about his ability to see faces and calls it a gift , lacks insight in to his substance abuse and is not very forthcoming about his suicidal plans . Patient will benefit from IP admission and treatment.  Daily contact with patient to assess and evaluate symptoms and progress in treatment and  Medication management   Patient will benefit from inpatient treatment and stabilization.  Estimated length of stay is 5-7 days.  Reviewed past medical records,treatment plan.  Will restart Seroquel 300 mg po qhs for psychosis, mood sx, sleep. Pt reports that is the only medication that is effective. Will start Zoloft 25 mg po daily for affective sx.  Will start CIWA/librium po prn for alcohol withdrawal sx. Will restart home medications where indicated. Will provide Robaxin po prn as well as Motrin po prn as scheduled for headaches and neck pain - is chronic . Will also get a CT scan head . Will continue to monitor vitals ,medication compliance and treatment side effects while patient is here.  Will monitor for medical issues as well as call consult as needed.  Reviewed labs cbc - wnl, cmp - shows K+ as low - replaced in ED - repeat CMP ordered this AM was declined by patient , uds- pos for cocaine, BAL<5 , will reorder , will get TSH, lipid panel, hba1c, pl.  CSW will start working on disposition.  Patient to participate in therapeutic milieu .      Observation Level/Precautions:  15 minute checks    Psychotherapy:  Individual and group therapy     Consultations:  CSW  Discharge Concerns:  Stability and safety       Physician Treatment Plan for Primary Diagnosis: Schizoaffective disorder, depressive type ([place]) Long Term Goal(s): Improvement in symptoms so as ready for discharge  Short Term Goals: Ability to disclose and discuss suicidal ideas and Ability to identify triggers associated with substance abuse/mental health issues will improve  Physician Treatment Plan for Secondary Diagnosis: Principal Problem:   Schizoaffective disorder, depressive type ([place]) Active Problems:   Cocaine use disorder, mild, abuse   Essential hypertension   Chronic headache  Long Term Goal(s): Improvement in symptoms so as ready for discharge  Short Term Goals: Ability to disclose and discuss  suicidal ideas and Ability to identify triggers associated with substance abuse/mental health issues will improve  I certify that inpatient services furnished can reasonably be expected to improve the patient's condition.  Ervine Witucki, MD 10/13/201712:09 PM

## 2016-08-27 NOTE — Plan of Care (Signed)
Problem: Medication: Goal: Compliance with prescribed medication regimen will improve Outcome: Progressing Client has been compliant with medications regime AEB taking medication as scheduled, denies SE.

## 2016-08-27 NOTE — BHH Suicide Risk Assessment (Signed)
[facility]-Psych Admission Suicide Risk Assessment   Nursing information obtained from:  Patient Demographic factors:  Male, Low socioeconomic status, Unemployed Current Mental Status:  Suicidal ideation indicated by patient, Suicide plan, Plan includes specific time, place, or method, Self-harm thoughts, Self-harm behaviors Loss Factors:  Loss of significant relationship, Legal issues, Financial problems / change in socioeconomic status Historical Factors:  Prior suicide attempts Risk Reduction Factors:  Living with another person, especially a relative, Positive social support  Total Time spent with patient: 30 minutes Principal Problem: Schizoaffective disorder, depressive type ([place]) Diagnosis:   Patient Active Problem List   Diagnosis Date Noted  . Schizoaffective disorder, depressive type ([place]) [F25.1] 08/27/2016  . Cocaine use disorder, mild, abuse [F14.10] 08/27/2016  . Suicidal ideation [R45.851] 02/03/2016  . Acute encephalopathy [G93.40] 01/30/2016  . CKD (chronic kidney disease) stage 4, GFR 15-29 ml/min (HCC) [N18.4] 01/30/2016  . Benign essential HTN [I10] 01/30/2016  . Cocaine abuse [F14.10] 08/21/2012  . Hypokalemia [E87.6] 08/21/2012   Subjective Data: Please see H&P.   Continued Clinical Symptoms:  Alcohol Use Disorder Identification Test Final Score (AUDIT): 0 The "Alcohol Use Disorders Identification Test", Guidelines for Use in Primary Care, Second Edition.  [facility]). Score between 0-7:  no or low risk or alcohol related problems. Score between 8-15:  moderate risk of alcohol related problems. Score between 16-19:  high risk of alcohol related problems. Score 20 or above:  warrants further diagnostic evaluation for alcohol dependence and treatment.   CLINICAL FACTORS:   Depression:   Hopelessness Impulsivity Alcohol/Substance Abuse/Dependencies Previous Psychiatric Diagnoses and Treatments Medical Diagnoses and  Treatments/Surgeries   Musculoskeletal: Strength & Muscle Tone: within normal limits Gait & Station: normal Patient leans: N/A  Psychiatric Specialty Exam: Physical Exam  ROS  Blood pressure (!) 148/97, pulse 76, temperature 98.2 F (36.8 C), temperature source Oral, resp. rate 18, height 5\' 8"  (1.727 m), weight 86.4 kg (190 lb 8 oz), SpO2 100 %.Body mass index is 28.97 kg/m.   Please see H&P For MSE.   COGNITIVE FEATURES THAT CONTRIBUTE TO RISK:  Closed-mindedness, Polarized thinking and Thought constriction (tunnel vision)    SUICIDE RISK:   Moderate:  Frequent suicidal ideation with limited intensity, and duration, some specificity in terms of plans, no associated intent, good self-control, limited dysphoria/symptomatology, some risk factors present, and identifiable protective factors, including available and accessible social support.   PLAN OF CARE: Please see H&P.   I certify that inpatient services furnished can reasonably be expected to improve the patient's condition.  Mariska Daffin, MD 08/27/2016, 11:33 AM

## 2016-08-27 NOTE — Progress Notes (Signed)
Patient ID: Darryl Parsons, male   DOB: [birth year], 60 y.o.   MRN: [id number] D: Client on the unit. 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 A: Writer provided emotional support, noted tangential, loose association during interaction. Client also exhibited some paranoia. Writer reviewed medications with client, "I take Seroquel at night, I ain't going to be able to sleep without that Seroquel, I take 200 mg" "I get my medicine from [facility]" A: writer reviewed home medications no Seroquel documented. Staff with Gwenyth Bender FNP, received order for Seroquel 50 mg x 1. Medications administered as ordered. Staff will monitor q2min for safety. R: Client is safe on the unit, attended karaoke.

## 2016-08-27 NOTE — Progress Notes (Signed)
Patient ID: Darryl Parsons, male   DOB: [birth year], 60 y.o.   MRN: [id number] D: Client spends most of his time in bed today, startled when writer walks in to speak with him, jumping in bed. Client reports anxiety and depression "4" Client rambles on about taking Apresoline, "I haven't been taking it" "I just want what I was taking at home" "a lot of stuff readjusted" A: Writer provided emotional support trying to encourage client to be direct about concerns, but to no avail. Medications reviewed, administered as ordered. Staff will monitor q39min for safety. R: Client is safe on the unit, did not attend group.

## 2016-08-27 NOTE — BHH Counselor (Signed)
Adult Comprehensive Assessment  Patient ID: Darryl Parsons, male   DOB: [birth year], 60 y.o.   MRN: [id number]  Information Source: Information source: Patient  Current Stressors:  Employment / Job issues: Unemployed  Family Relationships: Patient reported not having any family supports.  Financial / Lack of resources (include bankruptcy): No financial income  Housing / Lack of housing: Patient reported living in the [place] with his "common law wife" Darryl Parsons.  Substance abuse: Patient denied any alcohol or substance abuse. Bereavement / Loss: Patient reported that his brother passed away in 2023-06-14 of this year. He mentioned that he was the only sibling that he was close to.   Living/Environment/Situation:  Living Arrangements: Spouse/significant other (Patient lives in the [place] with his "common law" wife, Darryl Parsons. ) Living conditions (as described by patient or guardian): Patient stated "it's okay, sometimes its a lot of noise with the party goers coming in and out."  How long has patient lived in current situation?: 9 months - Since the beginning of January. What is atmosphere in current home: Temporary  Family History:  Marital status: Long term relationship Long term relationship, how long?: Patient has been in a long term relationship with his "common law wife" Darryl Parsons for 16 years.  What types of issues is patient dealing with in the relationship?: Patient reported that his wife is disabled and he is her aid. Patient also reported that his wife is the "bread winner" and pays for all financial responsibilities.  Are you sexually active?: Yes What is your sexual orientation?: Heterosexual  Has your sexual activity been affected by drugs, alcohol, medication, or emotional stress?: No  Does patient have children?: Yes How many children?: 2 How is patient's relationship with their children?: Patient reported that he has a very distant relationship with his two adult children, both  age 59.  Childhood History:  By whom was/is the patient raised?: Grandparents Description of patient's relationship with caregiver when they were a child: "Proper, how it was suppose to be"; Good Patient's description of current relationship with people who raised him/her: Both grandparents are deceased.  Does patient have siblings?: Yes Number of Siblings: 4 Description of patient's current relationship with siblings: Patient reported that 2 of his siblings are no longer living. He also reported that he does not have a relationship with his remaining siblings.  Did patient suffer any verbal/emotional/physical/sexual abuse as a child?: No Did patient suffer from severe childhood neglect?: No Has patient ever been sexually abused/assaulted/raped as an adolescent or adult?: No Was the patient ever a victim of a crime or a disaster?: No Witnessed domestic violence?: No Has patient been effected by domestic violence as an adult?: No  Education:  Highest grade of school patient has completed: GED Currently a Ship broker?: No Learning disability?: No  Employment/Work Situation:   Employment situation: Unemployed Patient's job has been impacted by current illness: No What is the longest time patient has a held a job?: 5 years  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  Has patient ever been in the [employer]?: No Has patient ever served in combat?: No Did You Receive Any Psychiatric Treatment/Services While in Passenger transport manager?: No Are There Guns or Other Weapons in [place]?: No  Financial Resources:   Financial resources: No income, Medicaid Does patient have a Programmer, applications or guardian?: No  Alcohol/Substance Abuse:   What has been your use of drugs/alcohol within the last 12 months?: Patient denied any alcohol or substance  abuse  If attempted suicide, did drugs/alcohol play a role in this?: No Alcohol/Substance Abuse Treatment Hx: Denies past history Has  alcohol/substance abuse ever caused legal problems?: No  Social Support System:   Patient's Community Support System: None Describe Community Support System: Patient reported that he does not have a support system.  Type of faith/religion: None   Leisure/Recreation:   Leisure and Hobbies: Listening to music and doing art work   Strengths/Needs:   What things does the patient do well?: Creative; Music and art  In what areas does patient struggle / problems for patient: Financial stability; employment   Discharge Plan:   Does patient have access to transportation?: No Plan for no access to transportation at discharge: Buss pass/ Cab voucher-  if needed  Will patient be returning to same living situation after discharge?: Yes Currently receiving community mental health services: Yes ([facility] ) Does patient have financial barriers related to discharge medications?: No ([place] Medicaid )  Summary/Recommendations:   Summary and Recommendations (to be completed by the evaluator): Darryl Parsons is a 60 year old, African American male who is diagnosed with Schizoaffective disorder, depressive type ([place]) . He presented to the hospital voluntarily for the treatment of suicidal ideations, substance abuse, and depression. At admission, Darryl Parsons reported having visual hallucinations and presented with passive suicidal ideations. During the PSA with CSW intern, Darryl Parsons was pleasant and cooperative. He denied any suicidal ideations or visual hallucinations. He stated that he felt better than yesterday and anticipated receiving treatment so that he can return home with his wife. Darryl Parsons plans to continue to follow up with [facility] at discharge. He stated that he did not have any questions or concerns at this time. Darryl Parsons can benefit from crisis stabilization, medication management, therapeutic milieu, and referral services.   Darryl Parsons. 08/27/2016

## 2016-08-27 NOTE — Tx Team (Signed)
Interdisciplinary Treatment and Diagnostic Plan Update  08/27/2016 Time of Session: 11:25 AM  Darryl Parsons MRN: [id number]  Principal Diagnosis: Schizoaffective disorder, depressive type ([place])  Secondary Diagnoses: Active Problems:   Major depressive disorder, recurrent severe without psychotic features ([place])   Current Medications:  Current Facility-Administered Medications  Medication Dose Route Frequency Provider Last Rate Last Dose  . acetaminophen (TYLENOL) tablet 650 mg  650 mg Oral Q6H PRN Patrecia Pour, NP      . alum & mag hydroxide-simeth (MAALOX/MYLANTA) 200-200-20 MG/5ML suspension 30 mL  30 mL Oral Q4H PRN Patrecia Pour, NP      . amLODipine (NORVASC) tablet 10 mg  10 mg Oral Daily Patrecia Pour, NP   10 mg at 08/27/16 0841  . benazepril (LOTENSIN) tablet 10 mg  10 mg Oral Daily Patrecia Pour, NP   10 mg at 08/27/16 0840  . chlordiazePOXIDE (LIBRIUM) capsule 25 mg  25 mg Oral Q6H PRN Ursula Alert, MD      . hydrALAZINE (APRESOLINE) tablet 50 mg  50 mg Oral Q8H Patrecia Pour, NP   50 mg at 08/26/16 2235  . hydrOXYzine (ATARAX/VISTARIL) tablet 25 mg  25 mg Oral Q6H PRN Ursula Alert, MD      . ibuprofen (ADVIL,MOTRIN) tablet 600 mg  600 mg Oral Q6H PRN Kerrie Buffalo, NP      . isosorbide mononitrate (IMDUR) 24 hr tablet 30 mg  30 mg Oral Daily Patrecia Pour, NP      . loperamide (IMODIUM) capsule 2-4 mg  2-4 mg Oral PRN Ursula Alert, MD      . magnesium hydroxide (MILK OF MAGNESIA) suspension 30 mL  30 mL Oral Daily PRN Patrecia Pour, NP      . mirtazapine (REMERON) tablet 15 mg  15 mg Oral QHS Patrecia Pour, NP   15 mg at 08/26/16 2235  . multivitamin with minerals tablet 1 tablet  1 tablet Oral Daily Ursula Alert, MD   1 tablet at 08/27/16 0845  . OLANZapine (ZYPREXA) tablet 5 mg  5 mg Oral TID PRN Ursula Alert, MD       Or  . OLANZapine (ZYPREXA) injection 5 mg  5 mg Intramuscular TID PRN Ursula Alert, MD      . ondansetron (ZOFRAN-ODT)  disintegrating tablet 4 mg  4 mg Oral Q6H PRN Ursula Alert, MD      . thiamine (VITAMIN B-1) tablet 100 mg  100 mg Oral Daily Saramma Eappen, MD   100 mg at 08/27/16 0845    PTA Medications: Prescriptions Prior to Admission  Medication Sig Dispense Refill Last Dose  . amLODipine (NORVASC) 10 MG tablet Take 10 mg by mouth daily.   Past Week at Unknown time  . benazepril (LOTENSIN) 10 MG tablet Take 10 mg by mouth daily.   Past Week at Unknown time  . methocarbamol (ROBAXIN) 500 MG tablet Take 1 tablet (500 mg total) by mouth 2 (two) times daily. (Patient not taking: Reported on 08/26/2016) 10 tablet 0 Not Taking at Unknown time    Treatment Modalities: Medication Management, Group therapy, Case management,  1 to 1 session with clinician, Psychoeducation, Recreational therapy.   Physician Treatment Plan for Primary Diagnosis: Schizoaffective disorder, depressive type ([place]) Long Term Goal(s): Improvement in symptoms so as ready for discharge  Short Term Goals: Ability to disclose and discuss suicidal ideas  Medication Management: Evaluate patient's response, side effects, and tolerance of medication regimen.  Therapeutic Interventions: 1 to  1 sessions, Unit Group sessions and Medication administration.  Evaluation of Outcomes: Not Met  Physician Treatment Plan for Secondary Diagnosis: Active Problems:   Major depressive disorder, recurrent severe without psychotic features ([place])   Long Term Goal(s): Improvement in symptoms so as ready for discharge  Short Term Goals: Ability to identify triggers associated with substance abuse/mental health issues will improve  Medication Management: Evaluate patient's response, side effects, and tolerance of medication regimen.  Therapeutic Interventions: 1 to 1 sessions, Unit Group sessions and Medication administration.  Evaluation of Outcomes: Not Met   RN Treatment Plan for Primary Diagnosis: Schizoaffective disorder, depressive type  ([place]) Long Term Goal(s): Knowledge of disease and therapeutic regimen to maintain health will improve  Short Term Goals: Ability to verbalize feelings will improve and Ability to identify and develop effective coping behaviors will improve  Medication Management: RN will administer medications as ordered by provider, will assess and evaluate patient's response and provide education to patient for prescribed medication. RN will report any adverse and/or side effects to prescribing provider.  Therapeutic Interventions: 1 on 1 counseling sessions, Psychoeducation, Medication administration, Evaluate responses to treatment, Monitor vital signs and CBGs as ordered, Perform/monitor CIWA, COWS, AIMS and Fall Risk screenings as ordered, Perform wound care treatments as ordered.  Evaluation of Outcomes: Not Met   LCSW Treatment Plan for Primary Diagnosis: Schizoaffective disorder, depressive type ([place]) Long Term Goal(s): Safe transition to appropriate next level of care at discharge, Engage patient in therapeutic group addressing interpersonal concerns.  Short Term Goals: Engage patient in aftercare planning with referrals and resources and Increase skills for wellness and recovery  Therapeutic Interventions: Assess for all discharge needs, 1 to 1 time with Social worker, Explore available resources and support systems, Assess for adequacy in community support network, Educate family and significant other(s) on suicide prevention, Complete Psychosocial Assessment, Interpersonal group therapy.  Evaluation of Outcomes: Not Met   Progress in Treatment: Attending groups: Yes Participating in groups: Yes Taking medication as prescribed: Yes, MD continues to assess for medication changes as needed Toleration medication: Yes, no side effects reported at this time Family/Significant other contact made: Attempted; CSW will try again at a later time.  Patient understands diagnosis: Yes, requested treatment  for depression Discussing patient identified problems/goals with staff: Yes Medical problems stabilized or resolved: Yes Denies suicidal/homicidal ideation:  Issues/concerns per patient self-inventory: None Other: N/A  New problem(s) identified: None identified at this time.   New Short Term/Long Term Goal(s): None identified at this time.   Discharge Plan or Barriers: Return home, follow up with [facility]   Reason for Continuation of Hospitalization: Anxiety  Depression Hallucinations Medication stabilization Suicidal ideation   Estimated Length of Stay: 3-5 days  Attendees: Patient: 08/27/2016  11:25 AM  Physician: Dr. Shea Evans 08/27/2016  11:25 AM  Nursing: Linna Hoff 08/27/2016  11:25 AM  RN Care Manager: Lars Pinks 08/27/2016  11:25 AM  Social Worker: Ripley Fraise, LCSW 08/27/2016  11:25 AM  Recreational Therapist: Winfield Cunas 08/27/2016  11:25 AM  Other: Radonna Ricker, Social Work Intern  08/27/2016  11:25 AM  Other:  08/27/2016  11:25 AM  Other: 08/27/2016  11:25 AM    Scribe for Treatment Team: Radonna Ricker, Social Work Intern 08/27/2016 11:25 AM

## 2016-08-27 NOTE — Progress Notes (Signed)
Pt did not attend wrap-up group   

## 2016-08-27 NOTE — Progress Notes (Signed)
Patient ID: Darryl Parsons, male   DOB: 03-10-1956, 60 y.o.   MRN: [id number] Client increasing paranoid, refuses Hydralazine this morning and am labs, also reports he didn't sleep well but during night checks client appeared to be sleeping and didn't move during checks, nor did he notify staff that he wasn't sleeping.

## 2016-08-27 NOTE — Progress Notes (Signed)
Nursing Note 08/27/2016 [id number]  Data Reports sleeping fair with PRN sleep med.  Rates depression 4/10, hopelessness 2/10, and anxiety 4/10. Affect depressed, guarded.  Denies HI, SI, AVH.  Refused Hydralazine and Imdur this AM "I don't take that at home.  Some ER doctor tried to give those to me."  Took other meds.  States the Remeron did not help and says he won't take it.  C/O neck and head pain, requesting ibuprofen.  Attended AM group, missed afternoon group due to CT scan appointment.  Action Spoke with patient 1:1, nurse offered support to patient throughout shift.  MD notified of medicines/refusals- MD dc/d Remeron, continued hydralazine and Imdur- patient continues to refuse.  Continues to be monitored on 15 minute checks for safety. Received orders for ibuprofen and robaxin, given to patient after lunch.  Response Remains safe and appropriate.  Reports relief from neck/head pain.  Declined heat packs

## 2016-08-27 NOTE — Progress Notes (Signed)
Recreation Therapy Notes  Date: 08/27/16 Time: 1000 Location: 500 Hall Dayroom  Group Topic: Leisure Education  Goal Area(s) Addresses:  Patient will identify positive leisure activities.  Patient will identify one positive benefit of participation in leisure activities.   Intervention: Chairs, beach ball  Activity: Keep it Chartered certified accountant.  Patients sat in chairs arranged in a circle.  LRT was to count the number of times the ball was hit by the patients.  The ball could bounce off the floor, wall, tables and ceiling.  If the ball rolls flat on any surface, the count starts over.  Education:  Leisure Education, Dentist  Education Outcome: Acknowledges education/In group clarification offered/Needs additional education  Clinical Observations/Feedback: Pt did not attend group.    Victorino Sparrow, LRT/CTRS     Victorino Sparrow A 08/27/2016 12:25 PM

## 2016-08-27 NOTE — BHH Group Notes (Signed)
[place] LCSW Group Therapy  08/27/2016  1:05 PM  Type of Therapy:  Group therapy  Participation Level:  Active  Participation Quality:  Attentive  Affect:  Flat  Cognitive:  Oriented  Insight:  Limited  Engagement in Therapy:  Limited  Modes of Intervention:  Discussion, Socialization  Summary of Progress/Problems:  Chaplain was here to lead a group on themes of hope and courage. Was sent over to [facility] for CT scan.  Did not attend group.  Roque Lias B 08/27/2016 12:59 PM

## 2016-08-28 DIAGNOSIS — Z79899 Other long term (current) drug therapy: Secondary | ICD-10-CM

## 2016-08-28 DIAGNOSIS — F17210 Nicotine dependence, cigarettes, uncomplicated: Secondary | ICD-10-CM

## 2016-08-28 LAB — LIPID PANEL
CHOL/HDL RATIO: 3.2 ratio
Cholesterol: 193 mg/dL (ref 0–200)
HDL: 61 mg/dL (ref >40)
LDL CALC: 98 mg/dL (ref 0–99)
Triglycerides: 168 mg/dL — ABNORMAL HIGH (ref <150)
VLDL: 34 mg/dL (ref 0–40)

## 2016-08-28 LAB — COMPREHENSIVE METABOLIC PANEL
ALBUMIN: 4.2 g/dL (ref 3.5–5.0)
ALK PHOS: 63 U/L (ref 38–126)
ALT: 20 U/L (ref 17–63)
ANION GAP: 7 (ref 5–15)
AST: 29 U/L (ref 15–41)
BUN: 25 mg/dL — ABNORMAL HIGH (ref 6–20)
CHLORIDE: 111 mmol/L (ref 101–111)
CO2: 20 mmol/L — AB (ref 22–32)
Calcium: 9.2 mg/dL (ref 8.9–10.3)
Creatinine, Ser: 1.95 mg/dL — ABNORMAL HIGH (ref 0.61–1.24)
GFR calc Af Amer: 42 mL/min — ABNORMAL LOW (ref >60)
GFR calc non Af Amer: 36 mL/min — ABNORMAL LOW (ref >60)
GLUCOSE: 127 mg/dL — AB (ref 65–99)
POTASSIUM: 4.1 mmol/L (ref 3.5–5.1)
SODIUM: 138 mmol/L (ref 135–145)
Total Bilirubin: 0.8 mg/dL (ref 0.3–1.2)
Total Protein: 7.7 g/dL (ref 6.5–8.1)

## 2016-08-28 LAB — TSH: TSH: 0.477 u[IU]/mL (ref 0.350–4.500)

## 2016-08-28 NOTE — Progress Notes (Signed)
Patient ID: Darryl Parsons, male   DOB: Jun 21, 1956, 60 y.o.   MRN: [id number]  DAR: Pt. Denies SI/HI and A/V Hallucinations however appears paranoid and guarded. He reports sleep is good, appetite is good, energy level is normal, and concentration is good. He rates depression, anxiety, and hopelessness 3/10. He is non-compliant with some of his medications. He refused his Imdur this morning and vitamins. He also refused morning lab draws. NP Tanika notified of this. He also refused his vital signs recheck this morning. Patient does not report any pain or discomfort at this time. Support and encouragement provided to the patient however patient does not appear interested in participating in the milieu at this time as evidenced by not going to group, laying in bed throughout the morning. Patient is irritable and guarded. Writer inquired about the patient's last bowel movement. He refused to give the answer, stating, "that's personal." Patient irritable during shift assessment stating, "look, I'm gonna go back to my room and say forget these medications if you keep asking questions." He is isolative at this time. Q15 minute checks are maintained for safety.

## 2016-08-28 NOTE — Progress Notes (Signed)
Patient has been observed up in the dayroom briefly watching tv but later requested blank music sheet wanting to write music. He continues to refuse his scheduled apressoline. He rpoerts that he takes HCTZ 25 mg at home. He spent some time working on writing his music. Support givne and safety maintained on unit with 15 min checks.

## 2016-08-28 NOTE — Progress Notes (Signed)
[facility] MD Progress Note  08/28/2016 9:23 AM Darryl Parsons  MRN:  [id number]   Subjective:  Patient reports " I am tired."  Objective: [name] is awake, alert and oriented *3. Seen resting in bedroom.  Denies suicidal or homicidal ideation. Denies auditory or visual hallucination and does not appear to be responding to internal stimuli. Per staffing notes patient is refusing medications an vitals.  Patient is denying depression or depressive symptoms. Patient reports resting well with a good appetite. Support, encouragement and reassurance was provided.   Principal Problem: Schizoaffective disorder, depressive type ([place]) Diagnosis:   Patient Active Problem List   Diagnosis Date Noted  . Schizoaffective disorder, depressive type ([place]) [F25.1] 08/27/2016  . Cocaine use disorder, mild, abuse [F14.10] 08/27/2016  . Essential hypertension [I10] 08/27/2016  . Chronic headache [R51] 08/27/2016  . Suicidal ideation [R45.851] 02/03/2016  . Acute encephalopathy [G93.40] 01/30/2016  . CKD (chronic kidney disease) stage 4, GFR 15-29 ml/min (HCC) [N18.4] 01/30/2016  . Benign essential HTN [I10] 01/30/2016  . Cocaine abuse [F14.10] 08/21/2012  . Hypokalemia [E87.6] 08/21/2012   Total Time spent with patient: 30 minutes  Past Psychiatric History: See above  Past Medical History:  Past Medical History:  Diagnosis Date  . Chronic kidney disease   . Headache   . Hypertension   . Low back pain   . Mental disorder    History reviewed. No pertinent surgical history. Family History:  Family History  Problem Relation Age of Onset  . Cardiomyopathy Sister   . Lupus Sister   . Kidney failure Sister   . Factor VIII deficiency Neg Hx   . Mental illness Neg Hx    Family Psychiatric  History:  Social History:  History  Alcohol Use  . Yes    Comment: 1-2 beers a month     History  Drug Use  . Types: Cocaine    Social History   Social History  . Marital status: Single    Spouse  name: N/A  . Number of children: N/A  . Years of education: N/A   Social History Main Topics  . Smoking status: Current Every Day Smoker    Packs/day: 0.50    Types: Cigarettes  . Smokeless tobacco: Never Used  . Alcohol use Yes     Comment: 1-2 beers a month  . Drug use:     Types: Cocaine  . Sexual activity: No   Other Topics Concern  . None   Social History Narrative  . None   Additional Social History:                         Sleep: Fair  Appetite:  Fair  Current Medications: Current Facility-Administered Medications  Medication Dose Route Frequency Provider Last Rate Last Dose  . alum & mag hydroxide-simeth (MAALOX/MYLANTA) 200-200-20 MG/5ML suspension 30 mL  30 mL Oral Q4H PRN Patrecia Pour, NP      . amLODipine (NORVASC) tablet 10 mg  10 mg Oral Daily Patrecia Pour, NP   10 mg at 08/28/16 0747  . benazepril (LOTENSIN) tablet 10 mg  10 mg Oral Daily Patrecia Pour, NP   10 mg at 08/28/16 0747  . chlordiazePOXIDE (LIBRIUM) capsule 25 mg  25 mg Oral Q6H PRN Ursula Alert, MD      . hydrALAZINE (APRESOLINE) tablet 50 mg  50 mg Oral Q8H Patrecia Pour, NP   50 mg at 08/26/16 2235  .  hydrOXYzine (ATARAX/VISTARIL) tablet 25 mg  25 mg Oral Q6H PRN Ursula Alert, MD      . ibuprofen (ADVIL,MOTRIN) tablet 600 mg  600 mg Oral Q6H PRN Kerrie Buffalo, NP   600 mg at 08/27/16 1257  . isosorbide mononitrate (IMDUR) 24 hr tablet 30 mg  30 mg Oral Daily Patrecia Pour, NP      . loperamide (IMODIUM) capsule 2-4 mg  2-4 mg Oral PRN Ursula Alert, MD      . magnesium hydroxide (MILK OF MAGNESIA) suspension 30 mL  30 mL Oral Daily PRN Patrecia Pour, NP      . methocarbamol (ROBAXIN) tablet 500 mg  500 mg Oral Q8H PRN Ursula Alert, MD   500 mg at 08/27/16 1257  . multivitamin with minerals tablet 1 tablet  1 tablet Oral Daily Ursula Alert, MD   1 tablet at 08/27/16 0845  . OLANZapine (ZYPREXA) tablet 5 mg  5 mg Oral TID PRN Ursula Alert, MD       Or  . OLANZapine  (ZYPREXA) injection 5 mg  5 mg Intramuscular TID PRN Ursula Alert, MD      . ondansetron (ZOFRAN-ODT) disintegrating tablet 4 mg  4 mg Oral Q6H PRN Saramma Eappen, MD      . QUEtiapine (SEROQUEL) tablet 300 mg  300 mg Oral QHS Ursula Alert, MD   300 mg at 08/27/16 2119  . sertraline (ZOLOFT) tablet 25 mg  25 mg Oral Daily Ursula Alert, MD   25 mg at 08/28/16 0747  . thiamine (VITAMIN B-1) tablet 100 mg  100 mg Oral Daily Ursula Alert, MD   100 mg at 08/27/16 0845    Lab Results:  Results for orders placed or performed during the hospital encounter of 08/25/16 (from the past 48 hour(s))  Urine rapid drug screen (hosp performed)not at [facility]     Status: Abnormal   Collection Time: 08/26/16 11:47 AM  Result Value Ref Range   Opiates NONE DETECTED NONE DETECTED   Cocaine POSITIVE (A) NONE DETECTED   Benzodiazepines NONE DETECTED NONE DETECTED   Amphetamines NONE DETECTED NONE DETECTED   Tetrahydrocannabinol NONE DETECTED NONE DETECTED   Barbiturates NONE DETECTED NONE DETECTED    Comment:        DRUG SCREEN FOR MEDICAL PURPOSES ONLY.  IF CONFIRMATION IS NEEDED FOR ANY PURPOSE, NOTIFY LAB WITHIN 5 DAYS.        LOWEST DETECTABLE LIMITS FOR URINE DRUG SCREEN Drug Class       Cutoff (ng/mL) Amphetamine      1000 Barbiturate      200 Benzodiazepine   643 Tricyclics       329 Opiates          300 Cocaine          300 THC              50     Blood Alcohol level:  Lab Results  Component Value Date   ETH <5 08/25/2016   ETH <5 51/88/4166    Metabolic Disorder Labs: Lab Results  Component Value Date   HGBA1C <4.3 (L) 01/30/2016   MPG <77 01/30/2016   No results found for: PROLACTIN Lab Results  Component Value Date   CHOL 174 06/28/2008   TRIG 54 06/28/2008   HDL 63 06/28/2008   CHOLHDL 2.8 Ratio 06/28/2008   VLDL 11 06/28/2008   LDLCALC 100 (H) 06/28/2008   LDLCALC  12/11/2007    93  Total Cholesterol/HDL:CHD Risk Coronary Heart Disease Risk Table                      Men   Women  1/2 Average Risk   3.4   3.3    Physical Findings: AIMS: Facial and Oral Movements Muscles of Facial Expression: None, normal Lips and Perioral Area: None, normal Jaw: None, normal Tongue: None, normal,Extremity Movements Upper (arms, wrists, hands, fingers): None, normal Lower (legs, knees, ankles, toes): None, normal, Trunk Movements Neck, shoulders, hips: None, normal, Overall Severity Severity of abnormal movements (highest score from questions above): None, normal Incapacitation due to abnormal movements: None, normal Patient's awareness of abnormal movements (rate only patient's report): No Awareness, Dental Status Current problems with teeth and/or dentures?: No Does patient usually wear dentures?: No  CIWA:  CIWA-Ar Total: 2 COWS:     Musculoskeletal: Strength & Muscle Tone: within normal limits Gait & Station: normal Patient leans: N/A  Psychiatric Specialty Exam: Physical Exam  Nursing note and vitals reviewed. Constitutional: He is oriented to person, place, and time. He appears well-developed.  Cardiovascular: Normal rate.   Neurological: He is alert and oriented to person, place, and time.  Psychiatric: He has a normal mood and affect. His behavior is normal.    Review of Systems  Psychiatric/Behavioral: Negative for suicidal ideas. The patient is not nervous/anxious.     Blood pressure (!) 145/89, pulse (!) 57, temperature 97.6 F (36.4 C), temperature source Oral, resp. rate 18, height 5\' 8"  (1.727 m), weight 86.4 kg (190 lb 8 oz), SpO2 100 %.Body mass index is 28.97 kg/m.  General Appearance: Guarded  Eye Contact:  Minimal  Speech:  Clear and Coherent  Volume:  Decreased  Mood:  Depressed and Dysphoric  Affect:  Depressed and Flat  Thought Process:  Coherent  Orientation:  Full (Time, Place, and Person)  Thought Content:  Hallucinations: None  Suicidal Thoughts:  No  Homicidal Thoughts:  No  Memory:  Immediate;    Fair Recent;   Fair  Judgement:  Impaired  Insight:  Shallow  Psychomotor Activity:  Restlessness  Concentration:  Concentration: Fair  Recall:  [employer] of Knowledge:  Fair  Language:  Fair  Akathisia:  No  Handed:  Right  AIMS (if indicated):     Assets:  Communication Skills Resilience Social Support  ADL's:  Intact  Cognition:  WNL  Sleep:  Number of Hours: 6.75     I agree with current treatment plan on 08/28/2016, Patient seen face-to-face for psychiatric evaluation follow-up, chart reviewed. Reviewed the information documented and agree with the treatment plan.  Treatment Plan Summary: Daily contact with patient to assess and evaluate symptoms and progress in treatment and Medication management   Estimated length of stay is 5-7 days.  Reviewed past medical records,treatment plan.  Will restart Seroquel 300 mg po qhs for psychosis, mood sx, sleep. Pt reports that is the only medication that is effective. Will Continue Zoloft 25 mg po daily for affective sx.  Will Continue CIWA/librium po prn for alcohol withdrawal sx. Will restart home medications where indicated. Will provide Robaxin po prn as well as Motrin po prn as scheduled for headaches and neck pain - is chronic . Will also get a CT scan head . Will continue to monitor vitals ,medication compliance and treatment side effects while patient is here.  Will monitor for medical issues as well as call consult as needed.  Reviewed labs cbc - wnl, cmp -  shows K+ as low - replaced in ED - repeat CMP ordered this AM was declined by patient , uds- pos for cocaine, BAL<5 , will reorder , will get TSH, lipid panel, hba1c, pl.  CSW will start working on disposition.  Patient to participate in therapeutic milieu .   Derrill Center, NP 08/28/2016, 9:23 AM   Reviewed the information documented and agree with the treatment plan.  Brysun Eschmann 08/28/2016 2:51 PM

## 2016-08-28 NOTE — Plan of Care (Signed)
Problem: Activity: Goal: Interest or engagement in activities will improve Outcome: Not Progressing [place] is minimal in the milieu and participation today.

## 2016-08-28 NOTE — [facility] Group Notes (Signed)
The focus of this group is to educate the patient on the purpose and policies of crisis stabilization and provide a format to answer questions about their admission.  The group details unit policies and expectations of patients while admitted.  Patient did not attend 0900 nurse education orientation group this morning.  Patient stayed in bed.   

## 2016-08-28 NOTE — Progress Notes (Signed)
Patient ID: Darryl Parsons, male   DOB: 09-Jan-1956, 60 y.o.   MRN: [id number]  Patient signed a 72 hour request for discharge on 08/28/2016 at 1847.

## 2016-08-28 NOTE — [facility] Group Notes (Signed)
     [facility] Group Notes:  (Clinical Social Work)  08/28/2016  11:15-12:00PM  Summary of Progress/Problems:   Today's process group involved patients discussing their feelings related to being hospitalized, as well as benefits they see to being in the hospital.  These were itemized on the whiteboard, and then the group brainstormed specific ways in which they could seek for those same benefits to happen when they discharge and go back home. The patient expressed a primary feeling about being hospitalized is "it's a place to relax temporarily and focus on me."  He participated throughout group, appeared to agree with a lot of statements made through nodding of head, but did not contribute a lot to discussion.  He did contribute some suggestions to the white board lists.  Type of Therapy:  Group Therapy - Process  Participation Level:  Active  Participation Quality:  Attentive, Sharing and Supportive  Affect:  Anxious  Cognitive:  Appropriate and Oriented  Insight:  Engaged  Engagement in Therapy:  Engaged  Modes of Intervention:  Exploration, Discussion  Selmer Dominion, LCSW 08/28/2016, 1:31 PM

## 2016-08-28 NOTE — Progress Notes (Signed)
Adult Psychoeducational Group Note  Date:  08/28/2016 Time:  8:55 PM  Group Topic/Focus:  Wrap-Up Group:   The focus of this group is to help patients review their daily goal of treatment and discuss progress on daily workbooks.   Participation Level:  Active  Participation Quality:  Appropriate  Affect:  Appropriate  Cognitive:  Appropriate  Insight: Appropriate  Engagement in Group:  Engaged  Modes of Intervention:  Discussion  Additional Comments:The patient expressed that he rates today a 8.The patient also said that he attended all groups. Nash Shearer 08/28/2016, 8:55 PM

## 2016-08-29 NOTE — [facility] Group Notes (Signed)
The focus of this group is to educate the patient on the purpose and policies of crisis stabilization and provide a format to answer questions about their admission.  The group details unit policies and expectations of patients while admitted.  Patient did not attend 0900 nurse education orientation group this morning.  Patient stayed in bed.   

## 2016-08-29 NOTE — Progress Notes (Signed)
Writer spoke with patient 1:1 about his day and any medications prescribed for tonight. He reports that he will not take the appresoline because this is not one of his home meds. Writer encouraged him to talk to his doctor on tomorrow concerning this medication so that doctor could explain why this is ordered

## 2016-08-29 NOTE — [facility] Group Notes (Signed)
[place] Group Notes: (Clinical Social Work)   08/29/2016      Type of Therapy:  Group Therapy   Participation Level:  Did Not Attend despite MHT prompting   Selmer Dominion, LCSW 08/29/2016, 12:58 PM

## 2016-08-29 NOTE — Progress Notes (Signed)
[facility] MD Progress Note  08/29/2016 8:52 AM Darryl Parsons  MRN:  [id number]   Subjective:  Patient reports " I am not feeling to good."  (patient didn't elaborate with statement)  Objective: Darryl Parsons is awake, alert and oriented *3. Seen resting in bedroom.  Denies suicidal or homicidal ideation. Patient is flat and guarded with responses.Patient refused to have labs collected on 10/14. Per staffing notes pt is requesting to be discharged. Denies auditory or visual hallucination and does not appear to be responding to internal stimuli. Per staffing notes patient is still refusing medications and vitals.  Patient is denying depression or depressive symptoms. Patient reports resting well throughout the day. Patient reports a  good appetite. Support, encouragement and reassurance was provided.   Principal Problem: Schizoaffective disorder, depressive type ([place]) Diagnosis:   Patient Active Problem List   Diagnosis Date Noted  . Schizoaffective disorder, depressive type ([place]) [F25.1] 08/27/2016  . Cocaine use disorder, mild, abuse [F14.10] 08/27/2016  . Essential hypertension [I10] 08/27/2016  . Chronic headache [R51] 08/27/2016  . Suicidal ideation [R45.851] 02/03/2016  . Acute encephalopathy [G93.40] 01/30/2016  . CKD (chronic kidney disease) stage 4, GFR 15-29 ml/min (HCC) [N18.4] 01/30/2016  . Benign essential HTN [I10] 01/30/2016  . Cocaine abuse [F14.10] 08/21/2012  . Hypokalemia [E87.6] 08/21/2012   Total Time spent with patient: 30 minutes  Past Psychiatric History: See above  Past Medical History:  Past Medical History:  Diagnosis Date  . Chronic kidney disease   . Headache   . Hypertension   . Low back pain   . Mental disorder    History reviewed. No pertinent surgical history. Family History:  Family History  Problem Relation Age of Onset  . Cardiomyopathy Sister   . Lupus Sister   . Kidney failure Sister   . Factor VIII deficiency Neg Hx   . Mental illness  Neg Hx    Family Psychiatric  History:  Social History:  History  Alcohol Use  . Yes    Comment: 1-2 beers a month     History  Drug Use  . Types: Cocaine    Social History   Social History  . Marital status: Single    Spouse name: N/A  . Number of children: N/A  . Years of education: N/A   Social History Main Topics  . Smoking status: Current Every Day Smoker    Packs/day: 0.50    Types: Cigarettes  . Smokeless tobacco: Never Used  . Alcohol use Yes     Comment: 1-2 beers a month  . Drug use:     Types: Cocaine  . Sexual activity: No   Other Topics Concern  . None   Social History Narrative  . None   Additional Social History:                         Sleep: Fair  Appetite:  Fair  Current Medications: Current Facility-Administered Medications  Medication Dose Route Frequency Provider Last Rate Last Dose  . alum & mag hydroxide-simeth (MAALOX/MYLANTA) 200-200-20 MG/5ML suspension 30 mL  30 mL Oral Q4H PRN Patrecia Pour, NP      . amLODipine (NORVASC) tablet 10 mg  10 mg Oral Daily Patrecia Pour, NP   10 mg at 08/28/16 0747  . benazepril (LOTENSIN) tablet 10 mg  10 mg Oral Daily Patrecia Pour, NP   10 mg at 08/28/16 0747  . chlordiazePOXIDE (LIBRIUM) capsule 25  mg  25 mg Oral Q6H PRN Ursula Alert, MD      . hydrALAZINE (APRESOLINE) tablet 50 mg  50 mg Oral Q8H Patrecia Pour, NP   50 mg at 08/26/16 2235  . hydrOXYzine (ATARAX/VISTARIL) tablet 25 mg  25 mg Oral Q6H PRN Ursula Alert, MD      . ibuprofen (ADVIL,MOTRIN) tablet 600 mg  600 mg Oral Q6H PRN Kerrie Buffalo, NP   600 mg at 08/28/16 1353  . isosorbide mononitrate (IMDUR) 24 hr tablet 30 mg  30 mg Oral Daily Patrecia Pour, NP      . loperamide (IMODIUM) capsule 2-4 mg  2-4 mg Oral PRN Ursula Alert, MD      . magnesium hydroxide (MILK OF MAGNESIA) suspension 30 mL  30 mL Oral Daily PRN Patrecia Pour, NP      . methocarbamol (ROBAXIN) tablet 500 mg  500 mg Oral Q8H PRN Ursula Alert,  MD   500 mg at 08/28/16 1353  . multivitamin with minerals tablet 1 tablet  1 tablet Oral Daily Ursula Alert, MD   1 tablet at 08/27/16 0845  . OLANZapine (ZYPREXA) tablet 5 mg  5 mg Oral TID PRN Ursula Alert, MD       Or  . OLANZapine (ZYPREXA) injection 5 mg  5 mg Intramuscular TID PRN Ursula Alert, MD      . ondansetron (ZOFRAN-ODT) disintegrating tablet 4 mg  4 mg Oral Q6H PRN Saramma Eappen, MD      . QUEtiapine (SEROQUEL) tablet 300 mg  300 mg Oral QHS Ursula Alert, MD   300 mg at 08/28/16 2159  . sertraline (ZOLOFT) tablet 25 mg  25 mg Oral Daily Ursula Alert, MD   25 mg at 08/28/16 0747  . thiamine (VITAMIN B-1) tablet 100 mg  100 mg Oral Daily Ursula Alert, MD   100 mg at 08/27/16 0845    Lab Results:  Results for orders placed or performed during the hospital encounter of 08/26/16 (from the past 48 hour(s))  TSH     Status: None   Collection Time: 08/28/16  6:18 PM  Result Value Ref Range   TSH 0.477 0.350 - 4.500 uIU/mL    Comment: Performed by a 3rd Generation assay with a functional sensitivity of <=0.01 uIU/mL. Performed at [facility]   Lipid panel     Status: Abnormal   Collection Time: 08/28/16  6:18 PM  Result Value Ref Range   Cholesterol 193 0 - 200 mg/dL   Triglycerides 168 (H) <150 mg/dL   HDL 61 >40 mg/dL   Total CHOL/HDL Ratio 3.2 RATIO   VLDL 34 0 - 40 mg/dL   LDL Cholesterol 98 0 - 99 mg/dL    Comment:        Total Cholesterol/HDL:CHD Risk Coronary Heart Disease Risk Table                     Men   Women  1/2 Average Risk   3.4   3.3  Average Risk       5.0   4.4  2 X Average Risk   9.6   7.1  3 X Average Risk  23.4   11.0        Use the calculated Patient Ratio above and the CHD Risk Table to determine the patient's CHD Risk.        ATP III CLASSIFICATION (LDL):  <100     mg/dL   Optimal  100-129  mg/dL   Near or Above                    Optimal  130-159  mg/dL   Borderline  160-189  mg/dL   High  >190     mg/dL    Very High Performed at [facility]   Comprehensive metabolic panel     Status: Abnormal   Collection Time: 08/28/16  6:18 PM  Result Value Ref Range   Sodium 138 135 - 145 mmol/L   Potassium 4.1 3.5 - 5.1 mmol/L   Chloride 111 101 - 111 mmol/L   CO2 20 (L) 22 - 32 mmol/L   Glucose, Bld 127 (H) 65 - 99 mg/dL   BUN 25 (H) 6 - 20 mg/dL   Creatinine, Ser 1.95 (H) 0.61 - 1.24 mg/dL   Calcium 9.2 8.9 - 10.3 mg/dL   Total Protein 7.7 6.5 - 8.1 g/dL   Albumin 4.2 3.5 - 5.0 g/dL   AST 29 15 - 41 U/L   ALT 20 17 - 63 U/L   Alkaline Phosphatase 63 38 - 126 U/L   Total Bilirubin 0.8 0.3 - 1.2 mg/dL   GFR calc non Af Amer 36 (L) >60 mL/min   GFR calc Af Amer 42 (L) >60 mL/min    Comment: (NOTE) The eGFR has been calculated using the CKD EPI equation. This calculation has not been validated in all clinical situations. eGFR's persistently <60 mL/min signify possible Chronic Kidney Disease.    Anion gap 7 5 - 15    Comment: Performed at [facility]    Blood Alcohol level:  Lab Results  Component Value Date   [facility] <5 08/25/2016   ETH <5 70/62/3762    Metabolic Disorder Labs: Lab Results  Component Value Date   HGBA1C <4.3 (L) 01/30/2016   MPG <77 01/30/2016   No results found for: PROLACTIN Lab Results  Component Value Date   CHOL 193 08/28/2016   TRIG 168 (H) 08/28/2016   HDL 61 08/28/2016   CHOLHDL 3.2 08/28/2016   VLDL 34 08/28/2016   LDLCALC 98 08/28/2016   LDLCALC 100 (H) 06/28/2008    Physical Findings: AIMS: Facial and Oral Movements Muscles of Facial Expression: None, normal Lips and Perioral Area: None, normal Jaw: None, normal Tongue: None, normal,Extremity Movements Upper (arms, wrists, hands, fingers): None, normal Lower (legs, knees, ankles, toes): None, normal, Trunk Movements Neck, shoulders, hips: None, normal, Overall Severity Severity of abnormal movements (highest score from questions above): None, normal Incapacitation  due to abnormal movements: None, normal Patient's awareness of abnormal movements (rate only patient's report): No Awareness, Dental Status Current problems with teeth and/or dentures?: No Does patient usually wear dentures?: No  CIWA:  CIWA-Ar Total: 0 COWS:     Musculoskeletal: Strength & Muscle Tone: within normal limits Gait & Station: normal Patient leans: N/A  Psychiatric Specialty Exam: Physical Exam  Nursing note and vitals reviewed. Constitutional: He is oriented to person, place, and time. He appears well-developed.  Cardiovascular: Normal rate.   Neurological: He is alert and oriented to person, place, and time.  Psychiatric: He has a normal mood and affect. His behavior is normal.    Review of Systems  Psychiatric/Behavioral: Positive for depression. Negative for suicidal ideas. The patient is not nervous/anxious.     Blood pressure (!) 137/95, pulse 64, temperature 98.6 F (37 C), temperature source Oral, resp. rate 16, height _0  (1.727 m), weight 86.4 kg (  190 lb 8 oz), SpO2 100 %.Body mass index is 28.97 kg/m.  General Appearance: Guarded  Eye Contact:  Minimal  Speech:  Clear and Coherent  Volume:  Decreased  Mood:  Depressed and Dysphoric  Affect:  Depressed and Flat  Thought Process:  Coherent  Orientation:  Full (Time, Place, and Person)  Thought Content:  Hallucinations: None  Suicidal Thoughts:  No  Homicidal Thoughts:  No  Memory:  Immediate;   Fair Recent;   Fair  Judgement:  Impaired  Insight:  Shallow  Psychomotor Activity:  Restlessness  Concentration:  Concentration: Fair  Recall:  [employer] of Knowledge:  Fair  Language:  Fair  Akathisia:  No  Handed:  Right  AIMS (if indicated):     Assets:  Communication Skills Resilience Social Support  ADL's:  Intact  Cognition:  WNL  Sleep:  Number of Hours: 6.75     I agree with current treatment plan on 08/29/2016, Patient seen face-to-face for psychiatric evaluation follow-up, chart  reviewed. Reviewed the information documented and agree with the treatment plan.  Treatment Plan Summary: Daily contact with patient to assess and evaluate symptoms and progress in treatment and Medication management   Estimated length of stay is 5-7 days.  Reviewed past medical records,treatment plan.  Will continue Seroquel 300 mg po qhs for psychosis, mood sx, sleep. Pt reports that is the only medication that is effective. Will Continue Zoloft 25 mg po daily for affective sx.  Will Continue CIWA/librium po prn for alcohol withdrawal sx. Will restart home medications where indicated. Will  continue  Robaxin po prn as well as Motrin po prn as scheduled for headaches and neck pain - is chronic . Will also get a CT scan head . Will continue to monitor vitals ,medication compliance and treatment side effects while patient is here.  Will monitor for medical issues as well as call consult as needed.  Reviewed labs cbc - wnl, cmp - shows K+ as low - replaced in ED - repeat CMP ordered this AM was declined by patient , uds- pos for cocaine, BAL<5 , will reorder , will get TSH, lipid panel, hba1c, pl.   CSW will start working on disposition.  Patient to participate in therapeutic milieu   Derrill Center, NP 08/29/2016, 8:52 AM   Reviewed the information documented and agree with the treatment plan.  Darryl Parsons 08/29/2016 2:54 PM

## 2016-08-29 NOTE — Progress Notes (Signed)
Nursing Note 08/29/2016 [id number]  Data Reports sleeping good with PRN sleep med.  Rates depression 3/10, hopelessness 3/10, and anxiety 2/10.  .  Agitated this AM stating "I am not a morning person."  Needed several prompts before coming to med room for medicine.  Guarded, when asked basic questions patient stated "I don't need to be lectured or asked all of these questions."  Denies HI, SI, AVH.  Refused to attend groups this AM, encouraged to attend groups in afternoon.  Denies physical concerns today.  Continues to refuse hydralizine and Imdur.  BP elevated (see flowsheet) rechecked manually afternoon when refused PM hydralazine- 150/92, pulse 80. States "If I could take 25mg  of HCTZ my pressure would be fine.  I took that at home."  HCTZ not on med list, med rec, or noted on previous med history. 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 I wrote a bunch of music and tore it up."  Requested blank 10 bar staff paper.  Action Spoke with patient 1:1, nurse offered support to patient throughout shift.  NP notified of refusal/BP's. Given 40 sheets blank staff paper (printed/copied). Continues to be monitored on 15 minute checks for safety.  Response Remains safe and appropriate on unit.  Guarded and isolative in AM.  Continues to refuse Hydralazine and Imdur despite elevated BP's (NP aware).

## 2016-08-30 DIAGNOSIS — I10 Essential (primary) hypertension: Secondary | ICD-10-CM

## 2016-08-30 DIAGNOSIS — F251 Schizoaffective disorder, depressive type: Principal | ICD-10-CM

## 2016-08-30 LAB — HEMOGLOBIN A1C

## 2016-08-30 MED ORDER — SERTRALINE HCL 50 MG PO TABS
50.0000 mg | ORAL_TABLET | Freq: Every day | ORAL | Status: DC
  Administered 2016-08-31: 50 mg via ORAL
  Filled 2016-08-30 (×2): qty 1
  Filled 2016-08-30: qty 3

## 2016-08-30 MED ORDER — ISOSORBIDE MONONITRATE 15 MG HALF TABLET
45.0000 mg | ORAL_TABLET | Freq: Every day | ORAL | Status: DC
  Filled 2016-08-30 (×2): qty 3

## 2016-08-30 NOTE — Progress Notes (Signed)
[facility] Group Notes:  (Nursing/MHT/Case Management/Adjunct)  Date:  08/30/2016  Time:  9:14 PM  Type of Therapy:  Psychoeducational Skills  Participation Level:  Active  Participation Quality:  Appropriate  Affect:  Appropriate  Cognitive:  Appropriate  Insight:  Appropriate  Engagement in Group:  Engaged  Modes of Intervention:  Education  Summary of Progress/Problems: The patient shared with the group this evening that he had a good day overall. He states that he might be discharged tomorrow and that he has been feeling "more enthusiastic". He also stated that the staff assisted him with reaching his goal for today. As for the theme of the day, his wellness strategy will be to "stay focused" and "stay positive".   Archie Balboa S 08/30/2016, 9:14 PM

## 2016-08-30 NOTE — Progress Notes (Signed)
  [facility] Adult Case Management Discharge Plan :  Will you be returning to the same living situation after discharge:  Yes,  home At discharge, do you have transportation home?: Yes,  bus pass Do you have the ability to pay for your medications: Yes,  MCD  Release of information consent forms completed and in the chart;  Patient's signature needed at discharge.  Patient to Follow up at: Follow-up Information    [facility] Follow up on 08/30/2016.   Specialty:  [facility] information: [place] [address] [phone]           Next level of care provider has access to [place] and Suicide Prevention discussed: Yes,  yes  Have you used any form of tobacco in the last 30 days? (Cigarettes, Smokeless Tobacco, Cigars, and/or Pipes): Yes  Has patient been referred to the Quitline?: Patient refused referral  Patient has been referred for addiction treatment: Yes  Trish Mage 08/30/2016, 2:42 PM

## 2016-08-30 NOTE — [facility] Group Notes (Signed)
[facility] LCSW Group Therapy  08/30/2016 1:15 pm  Type of Therapy: Process Group Therapy  Participation Level:  Active  Participation Quality:  Appropriate  Affect:  Flat  Cognitive:  Oriented  Insight:  Improving  Engagement in Group:  Limited  Engagement in Therapy:  Limited  Modes of Intervention:  Activity, Clarification, Education, Problem-solving and Support  Summary of Progress/Problems: Today's group addressed the issue of overcoming obstacles.  Patients were asked to identify their biggest obstacle post d/c that stands in the way of their on-going success, and then problem solve as to how to manage this. Stayed the entire time, engaged throughout.  Identified "negative associates" as the biggest obstacle for himself, and was open about the fact that he uses drugs and alcohol with them.  "I need to stay focused and tell them no.  I've done it before, and I can do it again."  Stated that his "domestic partner" is supportive and he would do well to listen to her advice more often.  Roque Lias B 08/30/2016   2:24 PM

## 2016-08-30 NOTE — Tx Team (Signed)
Interdisciplinary Treatment and Diagnostic Plan Update  08/30/2016 Time of Session: 2:27 PM  Darryl Parsons MRN: [id number]  Principal Diagnosis: Schizoaffective disorder, depressive type ([place])  Secondary Diagnoses: Principal Problem:   Schizoaffective disorder, depressive type ([place]) Active Problems:   Cocaine use disorder, mild, abuse   Essential hypertension   Chronic headache   Current Medications:  Current Facility-Administered Medications  Medication Dose Route Frequency Provider Last Rate Last Dose  . alum & mag hydroxide-simeth (MAALOX/MYLANTA) 200-200-20 MG/5ML suspension 30 mL  30 mL Oral Q4H PRN Patrecia Pour, NP      . amLODipine (NORVASC) tablet 10 mg  10 mg Oral Daily Patrecia Pour, NP   10 mg at 08/30/16 7408  . benazepril (LOTENSIN) tablet 10 mg  10 mg Oral Daily Patrecia Pour, NP   10 mg at 08/30/16 1448  . chlordiazePOXIDE (LIBRIUM) capsule 25 mg  25 mg Oral Q6H PRN Ursula Alert, MD      . hydrOXYzine (ATARAX/VISTARIL) tablet 25 mg  25 mg Oral Q6H PRN Ursula Alert, MD      . ibuprofen (ADVIL,MOTRIN) tablet 600 mg  600 mg Oral Q6H PRN Kerrie Buffalo, NP   600 mg at 08/29/16 2121  . isosorbide mononitrate (IMDUR) 24 hr tablet 30 mg  30 mg Oral Daily Patrecia Pour, NP   30 mg at 08/30/16 0820  . magnesium hydroxide (MILK OF MAGNESIA) suspension 30 mL  30 mL Oral Daily PRN Patrecia Pour, NP      . methocarbamol (ROBAXIN) tablet 500 mg  500 mg Oral Q8H PRN Ursula Alert, MD   500 mg at 08/28/16 1353  . multivitamin with minerals tablet 1 tablet  1 tablet Oral Daily Ursula Alert, MD   1 tablet at 08/30/16 0820  . OLANZapine (ZYPREXA) tablet 5 mg  5 mg Oral TID PRN Ursula Alert, MD       Or  . OLANZapine (ZYPREXA) injection 5 mg  5 mg Intramuscular TID PRN Ursula Alert, MD      . QUEtiapine (SEROQUEL) tablet 300 mg  300 mg Oral QHS Ursula Alert, MD   300 mg at 08/29/16 2121  . [START ON 08/31/2016] sertraline (ZOLOFT) tablet 50 mg  50 mg Oral Daily  Saramma Eappen, MD      . thiamine (VITAMIN B-1) tablet 100 mg  100 mg Oral Daily Saramma Eappen, MD   100 mg at 08/30/16 0820    PTA Medications: Prescriptions Prior to Admission  Medication Sig Dispense Refill Last Dose  . amLODipine (NORVASC) 10 MG tablet Take 10 mg by mouth daily.   Past Week at Unknown time  . benazepril (LOTENSIN) 10 MG tablet Take 10 mg by mouth daily.   Past Week at Unknown time  . methocarbamol (ROBAXIN) 500 MG tablet Take 1 tablet (500 mg total) by mouth 2 (two) times daily. (Patient not taking: Reported on 08/26/2016) 10 tablet 0 Not Taking at Unknown time    Treatment Modalities: Medication Management, Group therapy, Case management,  1 to 1 session with clinician, Psychoeducation, Recreational therapy.   Physician Treatment Plan for Primary Diagnosis: Schizoaffective disorder, depressive type ([place]) Long Term Goal(s): Improvement in symptoms so as ready for discharge  Short Term Goals: Ability to disclose and discuss suicidal ideas  Medication Management: Evaluate patient's response, side effects, and tolerance of medication regimen.  Therapeutic Interventions: 1 to 1 sessions, Unit Group sessions and Medication administration.  Evaluation of Outcomes: Adequate for Discharge  Physician Treatment Plan for Secondary  Diagnosis: Principal Problem:   Schizoaffective disorder, depressive type ([place]) Active Problems:   Cocaine use disorder, mild, abuse   Essential hypertension   Chronic headache   Long Term Goal(s): Improvement in symptoms so as ready for discharge  Short Term Goals: Ability to identify triggers associated with substance abuse/mental health issues will improve  Medication Management: Evaluate patient's response, side effects, and tolerance of medication regimen.  Therapeutic Interventions: 1 to 1 sessions, Unit Group sessions and Medication administration.  Evaluation of Outcomes: Adequate for Discharge   RN Treatment Plan for  Primary Diagnosis: Schizoaffective disorder, depressive type ([place]) Long Term Goal(s): Knowledge of disease and therapeutic regimen to maintain health will improve  Short Term Goals: Ability to verbalize feelings will improve and Ability to identify and develop effective coping behaviors will improve  Medication Management: RN will administer medications as ordered by provider, will assess and evaluate patient's response and provide education to patient for prescribed medication. RN will report any adverse and/or side effects to prescribing provider.  Therapeutic Interventions: 1 on 1 counseling sessions, Psychoeducation, Medication administration, Evaluate responses to treatment, Monitor vital signs and CBGs as ordered, Perform/monitor CIWA, COWS, AIMS and Fall Risk screenings as ordered, Perform wound care treatments as ordered.  Evaluation of Outcomes: Adequate for Discharge   LCSW Treatment Plan for Primary Diagnosis: Schizoaffective disorder, depressive type ([place]) Long Term Goal(s): Safe transition to appropriate next level of care at discharge, Engage patient in therapeutic group addressing interpersonal concerns.  Short Term Goals: Engage patient in aftercare planning with referrals and resources and Increase skills for wellness and recovery  Therapeutic Interventions: Assess for all discharge needs, 1 to 1 time with Social worker, Explore available resources and support systems, Assess for adequacy in community support network, Educate family and significant other(s) on suicide prevention, Complete Psychosocial Assessment, Interpersonal group therapy.  Evaluation of Outcomes: Met  See below   Progress in Treatment: Attending groups: Yes Participating in groups: Yes Taking medication as prescribed: Yes, MD continues to assess for medication changes as needed Toleration medication: Yes, no side effects reported at this time Family/Significant other contact made: No Patient  understands diagnosis: Yes, requested treatment for depression Discussing patient identified problems/goals with staff: Yes Medical problems stabilized or resolved: Yes Denies suicidal/homicidal ideation: Yes Issues/concerns per patient self-inventory: None Other: N/A  New problem(s) identified: None identified at this time.   New Short Term/Long Term Goal(s): None identified at this time.   Discharge Plan or Barriers: Return home, follow up with [facility]   Reason for Continuation of Hospitalization:  Medication stabilization    Estimated Length of Stay: Likely d/c tomorrow  Attendees: Patient: 08/30/2016  2:27 PM  Physician: Dr. Shea Evans 08/30/2016  2:27 PM  Nursing: Glenis Smoker, RN 08/30/2016  2:27 PM  RN Care Manager: Lars Pinks 08/30/2016  2:27 PM  Social Worker: Ripley Fraise, [place] 08/30/2016  2:27 PM  Recreational Therapist: Winfield Cunas 08/30/2016  2:27 PM  Other: Radonna Ricker, Social Work Intern  08/30/2016  2:27 PM  Other:  08/30/2016  2:27 PM  Other: 08/30/2016  2:27 PM    Scribe for Treatment Team: Ripley Fraise LCSW 08/30/2016 2:27 PM

## 2016-08-30 NOTE — [facility] Suicide Risk Assessment (Signed)
[facility] [place] INPATIENT:  Family/Significant Other Suicide Prevention Education  Suicide Prevention Education:  Contact Attempts: [facility] [address], [place], [place] [address] [phone] has been identified by the patient as the family member/significant other with whom the patient will be residing, and identified as the person(s) who will aid the patient in the event of a mental health crisis.  With written consent from the patient, two attempts were made to provide suicide prevention education, prior to and/or following the patient's discharge.  We were unsuccessful in providing suicide prevention education.  A suicide education pamphlet was given to the patient to share with family/significant other.  Date and time of first attempt:08/27/2016 11:45 AM Date and time of second attempt:08/30/2016 2:32 PM   Trish Mage 08/30/2016, 2:32 PM

## 2016-08-30 NOTE — Progress Notes (Signed)
Hospitalist consulted for worsening creatinine, PT on admission 1.85, today is 1.95, patient with baseline CK D stage III, at baseline, baseline creatinine 1.4-2, recommendation is to stop NSAIDs, as well as it appears patient to be on ACE inhibitor benazepril, he does not have proteinuria, no diabetes, no cardiomyopathy, so I will discontinue ACE inhibitor, and increase Imdur from 30-45 mg oral daily, full consult in a.m.[name] Phillips Climes MD

## 2016-08-30 NOTE — Plan of Care (Signed)
Problem: Medication: Goal: Compliance with prescribed medication regimen will improve Outcome: Not Progressing Patient not compliant with scheduled medications, refuses apressoline.

## 2016-08-30 NOTE — Progress Notes (Signed)
Pt attended wrap-up group this evening to discuss day and goals while in the hospital.  When spoken to by writer and addressed by his name pt stated that his name was not Elberta Fortis but "Mitzi Hansen".  When asked about his day pt stated that his day was, "restful, relaxing, getting my thoughts together and being low key."  States his day was 5 out of 10 with 10 being the worst with not further explanation.  Rick Duff, MHT
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
## 2016-08-31 LAB — BASIC METABOLIC PANEL
ANION GAP: 7 (ref 5–15)
BUN: 23 mg/dL — ABNORMAL HIGH (ref 6–20)
CHLORIDE: 109 mmol/L (ref 101–111)
CO2: 23 mmol/L (ref 22–32)
Calcium: 9.4 mg/dL (ref 8.9–10.3)
Creatinine, Ser: 1.89 mg/dL — ABNORMAL HIGH (ref 0.61–1.24)
GFR calc non Af Amer: 38 mL/min — ABNORMAL LOW (ref >60)
GFR, EST AFRICAN AMERICAN: 43 mL/min — AB (ref >60)
Glucose, Bld: 92 mg/dL (ref 65–99)
POTASSIUM: 4 mmol/L (ref 3.5–5.1)
Sodium: 139 mmol/L (ref 135–145)

## 2016-08-31 MED ORDER — HYDROCHLOROTHIAZIDE 12.5 MG PO CAPS
12.5000 mg | ORAL_CAPSULE | Freq: Every day | ORAL | Status: DC
  Administered 2016-08-31: 12.5 mg via ORAL
  Filled 2016-08-31 (×3): qty 1

## 2016-08-31 MED ORDER — THIAMINE HCL 100 MG PO TABS: 100.0000 mg | ORAL_TABLET | Freq: Every day | ORAL | 0 refills | Status: DC

## 2016-08-31 MED ORDER — BENAZEPRIL HCL 10 MG PO TABS: 10.0000 mg | ORAL_TABLET | Freq: Every day | ORAL | 0 refills | Status: DC

## 2016-08-31 MED ORDER — SERTRALINE HCL 50 MG PO TABS: 50.0000 mg | ORAL_TABLET | Freq: Every day | ORAL | 0 refills | Status: DC

## 2016-08-31 MED ORDER — BENAZEPRIL HCL 10 MG PO TABS: 10.0000 mg | ORAL_TABLET | Freq: Every day | ORAL | Status: DC

## 2016-08-31 MED ORDER — HYDROCHLOROTHIAZIDE 12.5 MG PO CAPS: 12.5000 mg | ORAL_CAPSULE | Freq: Every day | ORAL | 0 refills | Status: DC

## 2016-08-31 MED ORDER — HYDROXYZINE HCL 25 MG PO TABS: 25.0000 mg | ORAL_TABLET | Freq: Four times a day (QID) | ORAL | 0 refills | Status: DC | PRN

## 2016-08-31 MED ORDER — AMLODIPINE BESYLATE 10 MG PO TABS: 10.0000 mg | ORAL_TABLET | Freq: Every day | ORAL | 0 refills | Status: DC

## 2016-08-31 MED ORDER — QUETIAPINE FUMARATE 300 MG PO TABS
300.0000 mg | ORAL_TABLET | Freq: Every day | ORAL | 0 refills | Status: DC
Start: 2016-08-31 — End: 2018-07-25

## 2016-08-31 NOTE — Consult Note (Signed)
Patient Demographics  Darryl Parsons, is a 60 y.o. male   MRN: [id number]   DOB - 1956/09/25  Admit Date - 08/26/2016    Outpatient Primary MD for the patient is PROVIDER NOT [place] requested in the Hospital by Jenne Campus, MD, On 08/31/2016    Reason for consult CKD   With History of -  Past Medical History:  Diagnosis Date  . Chronic kidney disease   . Headache   . Hypertension   . Low back pain   . Mental disorder       History reviewed. No pertinent surgical history.  in for   No chief complaint on file.    HPI  Darryl Parsons  is a 60 y.o. male, Past medical history of hypertension, COPD, schizoaffective/depressive disorder, who was admitted to behavioral health on 10/13 for suicide attempt, hospitalist service has been consulted for creatinine of 1.95, and for hypertensive management, patient on benazepril and amlodipine at home, was started on Imdur during hospital stay for uncontrolled blood pressure, as well repeat creatinine was 1.95. - Patient denies any dyspnea, shortness of breath, fever, chills, edema.    Review of Systems    In addition to the HPI above, No Fever-chills, No Headache, No changes with Vision or hearing, No problems swallowing food or Liquids, No Chest pain, Cough or Shortness of Breath, No Abdominal pain, No Nausea or Vommitting, Bowel movements are regular, No Blood in stool or Urine, No dysuria, No new skin rashes or bruises, No new joints pains-aches,  No new weakness, tingling, numbness in any extremity, No recent weight gain or loss, No polyuria, polydypsia or polyphagia, No significant Mental Stressors.  A full 10 point Review of Systems was done, except as stated above, all other Review of Systems were negative.   Social History Social History  Substance Use Topics  . Smoking status: Current Every Day Smoker   Packs/day: 0.50    Types: Cigarettes  . Smokeless tobacco: Never Used  . Alcohol use Yes     Comment: 1-2 beers a month     Family History Family History  Problem Relation Age of Onset  . Cardiomyopathy Sister   . Lupus Sister   . Kidney failure Sister   . Factor VIII deficiency Neg Hx   . Mental illness Neg Hx      Prior to Admission medications   Medication Sig Start Date End Date Taking? Authorizing Provider  amLODipine (NORVASC) 10 MG tablet Take 10 mg by mouth daily.   Yes Historical Provider, MD  benazepril (LOTENSIN) 10 MG tablet Take 10 mg by mouth daily.   Yes Historical Provider, MD  methocarbamol (ROBAXIN) 500 MG tablet Take 1 tablet (500 mg total) by mouth 2 (two) times daily. Patient not taking: Reported on 08/26/2016 08/08/16   Roxanna Mew, PA-C    Anti-infectives    None      Scheduled Meds: . amLODipine  10 mg Oral Daily  . isosorbide mononitrate  45 mg Oral Daily  . multivitamin with minerals  1 tablet Oral Daily  . QUEtiapine  300 mg Oral QHS  . sertraline  50 mg Oral Daily  . thiamine  100 mg Oral Daily   Continuous Infusions:  PRN Meds:.alum & mag hydroxide-simeth, chlordiazePOXIDE, hydrOXYzine, magnesium hydroxide, methocarbamol, OLANZapine **OR** OLANZapine  Allergies  Allergen Reactions  . Other     Pt states he is allergic to a blood pressure pill. He is unable to state reaction. He is spelling it adelatt and aderlatt.    Physical Exam  Vitals  Blood pressure (!) 130/97, pulse 74, temperature 97.8 F (36.6 C), resp. rate 20, height 5\' 8"  (1.727 m), weight 86.4 kg (190 lb 8 oz), SpO2 100 %.   1. General Nourished male lying in bed in NAD,   2.  Awake Alert, Oriented X 3.  3. No F.N deficits, ALL C.Nerves Intact, Strength 5/5 all 4 extremities, Sensation intact all 4 extremities, Plantars down going.  4. Ears and Eyes appear Normal, Conjunctivae clear, PERRLA. Moist Oral Mucosa.  5. Supple Neck, No JVD, No cervical  lymphadenopathy appriciated, No Carotid Bruits.  6. Symmetrical Chest wall movement, Good air movement bilaterally, CTAB.  7. RRR, No Gallops, Rubs or Murmurs, No Parasternal Heave.  8. Positive Bowel Sounds, Abdomen Soft, No tenderness, No organomegaly appriciated,No rebound -guarding or rigidity.  9.  No Cyanosis, Normal Skin Turgor, No Skin Rash or Bruise.  10. Good muscle tone,  joints appear normal , no effusions, Normal ROM.  11. No Palpable Lymph Nodes in Neck or Axillae    Data Review  CBC  Recent Labs Lab 08/25/16 0835  WBC 5.9  HGB 12.5*  HCT 37.4*  PLT 197  MCV 84.4  MCH 28.2  MCHC 33.4  RDW 14.2  LYMPHSABS 1.9  MONOABS 0.5  EOSABS 0.1  BASOSABS 0.0   ------------------------------------------------------------------------------------------------------------------  Chemistries   Recent Labs Lab 08/25/16 0835 08/28/16 1818  NA 137 138  K 3.1* 4.1  CL 109 111  CO2 22 20*  GLUCOSE 108* 127*  BUN 20 25*  CREATININE 1.85* 1.95*  CALCIUM 9.0 9.2  AST 27 29  ALT 21 20  ALKPHOS 52 63  BILITOT 0.8 0.8   ------------------------------------------------------------------------------------------------------------------ estimated creatinine clearance is 44.2 mL/min (by C-G formula based on SCr of 1.95 mg/dL (H)). ------------------------------------------------------------------------------------------------------------------  Recent Labs  08/28/16 1818  TSH 0.477     Coagulation profile No results for input(s): INR, PROTIME in the last 168 hours. ------------------------------------------------------------------------------------------------------------------- No results for input(s): DDIMER in the last 72 hours. -------------------------------------------------------------------------------------------------------------------  Cardiac Enzymes No results for input(s): CKMB, TROPONINI, MYOGLOBIN in the last 168 hours.  Invalid input(s):  CK ------------------------------------------------------------------------------------------------------------------ Invalid input(s): POCBNP   ---------------------------------------------------------------------------------------------------------------  Urinalysis    Component Value Date/Time   COLORURINE AMBER (A) 02/02/2016 0515   APPEARANCEUR CLEAR 02/02/2016 0515   LABSPEC 1.024 02/02/2016 0515   PHURINE 6.0 02/02/2016 0515   GLUCOSEU NEGATIVE 02/02/2016 0515   HGBUR NEGATIVE 02/02/2016 0515   BILIRUBINUR SMALL (A) 02/02/2016 0515   KETONESUR 15 (A) 02/02/2016 0515   PROTEINUR NEGATIVE 02/02/2016 0515   UROBILINOGEN 0.2 09/06/2014 2224   NITRITE NEGATIVE 02/02/2016 0515   LEUKOCYTESUR NEGATIVE 02/02/2016 0515     Imaging results:   No results found.   Assessment & Plan  Principal Problem:   Schizoaffective disorder, depressive type ([place]) Active Problems:   Cocaine use disorder, mild, abuse   Essential hypertension   Chronic headache  CKD stage III - Patient baseline  creatinine 1.4-2, currently 1.89 at day of discharge, appears to be at baseline, no further workup indicated at this point. - Recommendation is to stop NSAISs , was discussed at length with the patient. - Patient with no evidence of proteinuria, so there is no protective event of ACEI, but given patient is been on it for multiple years, with stable renal function, and according to ACCOM[employer] trial ACE inhibitor can be used as a second line therapy agent after amlodipine, so we'll continue with amlodipine and benazepril on discharge, and will start on low-dose hydrochlorothiazide on discharge .  Hypertension - Continue with home medication including benazepril, amlodipine, and will add low dose hydrochlorothiazide as third line therapy for optimal control, will discontinue Imdur.  Discharge regimen recommendation his - Amlodipine 10 mg oral daily - Benazepril 10 mg oral daily - Hydrochlorothiazide  12.5 mg oral daily  Schizoaffective disorder /depressive type  - Measurement per primary care trichomoniasis team     Thank you for the consult, we will follow the patient with you in the Hospital.   [facility], Renn Stille M.D on 08/31/2016 at 8:52 AM  Between 7am to 7pm - Pager - [phone]  After 7pm go to [email or web address] - password [email or web address]   Thank you for the consult, we will follow the patient with you in the [place] [facility]   Office  [phone]

## 2016-08-31 NOTE — Discharge Summary (Signed)
Physician Discharge Summary Note  Patient:  Darryl Parsons is an 60 y.o., male MRN:  [id number] DOB:  February 24, 1956 Patient phone:  [phone] (home)  Patient address:   [address] [place] [address],  Total Time spent with patient: 45 minutes  Date of Admission:  08/26/2016 Date of Discharge: 08/31/16  Reason for Admission:   Darryl Parsons a 60 y.o.AA malewho is married , unemployed , who lives with his wife in [place] , who has a hx of  ? Bipolar disorder, depression , who presented to [facility] after a suicide attempt. Pt presented to the ED after overdosing on Prozac and seroquel in a suicide attempt. He has been very stressed and last night used cocaine and took an overdose, past attempts this year. Depressed today with feelings of hopelessness, helplessness, and worthlessness. Denied homicidal ideations,  and withdrawal symptoms."  Pt reports he has been seeing faces and calls it a gift that he has had since a child. Pt did not elaborate on the faces , but per initial notes in EHR -(  pt reports visual hallucinations reporting that he see's "faces appear in cement floor". Patient appeared to be looking at the "faces" during admission and states "you'd see them too if I showed you".  Patient reports sleep issues and states seroquel is the only medication that works for him. Pt reports he is sad , anxious all the time , but states he does have some mood swings. Pt reports that his wife thinks he may have Bipolar - but when asked about his mood swings , pt reports one minute I may think I can do this and feels good , then next minutes I feel sad again and does not feel good about it anymore. Other than this patient is unable to give any hx of manic sx and hence at this time its unlikely that he has Bipolar disorder.   Pt reports that he abuses cocaine 20 $ worth often to help his depressive sx, but patient states he does not call it abuse and that he does it because he is sad. Pt  denies abusing alcohol , but to the RN who did his admission to [address] yesterday patient had mentioned using alcohol on and off and getting drunk at times. Pt reports that seroquel is the only medication that works for him , he does not want to be on prozac anymore. However he feels that Zoloft worked well in the past and wants to be on it.  Pt reports he is also frustrated due to his chronic headaches and neck pain. Pt reports headaches that is generalized all over his head and goes down his neck and has been struggling with it for years. Pt reports he is in the process of a getting a PMD to help him since he just got his Medicaid.   Principal Problem: Schizoaffective disorder, depressive type [facility]) Discharge Diagnoses: Patient Active Problem List   Diagnosis Date Noted  . Schizoaffective disorder, depressive type ([place]) [F25.1] 08/27/2016  . Cocaine use disorder, mild, abuse [F14.10] 08/27/2016  . Essential hypertension [I10] 08/27/2016  . Chronic headache [R51] 08/27/2016  . Suicidal ideation [R45.851] 02/03/2016  . Acute encephalopathy [G93.40] 01/30/2016  . CKD (chronic kidney disease) stage 4, GFR 15-29 ml/min (HCC) [N18.4] 01/30/2016  . Benign essential HTN [I10] 01/30/2016  . Cocaine abuse [F14.10] 08/21/2012  . Hypokalemia [E87.6] 08/21/2012    Past Psychiatric History: See H&P  Past Medical History:  Past Medical History:  Diagnosis Date  . Chronic kidney disease   . Headache   . Hypertension   . Low back pain   . Mental disorder    History reviewed. No pertinent surgical history. Family History:  Family History  Problem Relation Age of Onset  . Cardiomyopathy Sister   . Lupus Sister   . Kidney failure Sister   . Factor VIII deficiency Neg Hx   . Mental illness Neg Hx    Family Psychiatric  History: See H&P  Social History:  History  Alcohol Use  . Yes    Comment: 1-2 beers a month     History  Drug Use  . Types: Cocaine    Social History   Social History   . Marital status: Single    Spouse name: N/A  . Number of children: N/A  . Years of education: N/A   Social History Main Topics  . Smoking status: Current Every Day Smoker    Packs/day: 0.50    Types: Cigarettes  . Smokeless tobacco: Never Used  . Alcohol use Yes     Comment: 1-2 beers a month  . Drug use:     Types: Cocaine  . Sexual activity: No   Other Topics Concern  . None   Social History Narrative  . None    Hospital Course:   Darryl Parsons was admitted for Schizoaffective disorder, depressive type [facility]) , with psychosis and crisis management.  Pt was treated discharged with the medications listed below under Medication List.  Medical problems were identified and treated as needed.  Home medications were restarted as appropriate.  Improvement was monitored by observation and Darryl Parsons 's daily report of symptom reduction.  Emotional and mental status was monitored by daily self-inventory reports completed by Darryl Parsons and clinical staff.         Darryl Parsons was evaluated by the treatment team for stability and plans for continued recovery upon discharge. Darryl Parsons 's motivation was an integral factor for scheduling further treatment. Employment, transportation, bed availability, health status, family support, and any pending legal issues were also considered during hospital stay. Pt was offered further treatment options upon discharge including but not limited to Residential, Intensive Outpatient, and Outpatient treatment.  Darryl Parsons will follow up with the services as listed below under Follow Up Information.     Upon completion of this admission the patient was both mentally and medically stable for discharge denying suicidal/homicidal ideation, auditory/visual/tactile hallucinations, delusional thoughts and paranoia.    Darryl Parsons responded well to treatment with amlodipine, benazepril, and HCTZ as well as vistaril, , zyprexa,  seroquel, and zoloft without adverse effects. Pt demonstrated improvement without reported or observed adverse effects to the point of stability appropriate for outpatient management. Pertinent labs include: UDS + cocaine, Reviewed CBC, CMP, BAL, and UDS; all unremarkable aside from noted exceptions.   Physical Findings: AIMS: Facial and Oral Movements Muscles of Facial Expression: None, normal Lips and Perioral Area: None, normal Jaw: None, normal Tongue: None, normal,Extremity Movements Upper (arms, wrists, hands, fingers): None, normal Lower (legs, knees, ankles, toes): None, normal, Trunk Movements Neck, shoulders, hips: None, normal, Overall Severity Severity of abnormal movements (highest score from questions above): None, normal Incapacitation due to abnormal movements: None, normal Patient's awareness of abnormal movements (rate only patient's report): No Awareness, Dental Status Current problems with teeth and/or dentures?: No Does patient usually wear dentures?: No  CIWA:  CIWA-Ar Total: 0 COWS:  Musculoskeletal: Strength & Muscle Tone: within normal limits Gait & Station: normal Patient leans: N/A  Psychiatric Specialty Exam: Physical Exam  Review of Systems  Psychiatric/Behavioral: Positive for depression and substance abuse. Negative for hallucinations and suicidal ideas. The patient is nervous/anxious and has insomnia.   All other systems reviewed and are negative.   Blood pressure (!) 130/97, pulse 74, temperature 97.8 F (36.6 C), resp. rate 20, height 5\' 8"  (1.727 m), weight 86.4 kg (190 lb 8 oz), SpO2 100 %.Body mass index is 28.97 kg/m.  SEE MD PSE WITHIN SRA     Have you used any form of tobacco in the last 30 days? (Cigarettes, Smokeless Tobacco, Cigars, and/or Pipes): Yes  Has this patient used any form of tobacco in the last 30 days? (Cigarettes, Smokeless Tobacco, Cigars, and/or Pipes) No  Blood Alcohol level:  Lab Results  Component Value Date    ETH <5 08/25/2016   ETH <5 10/62/6948    Metabolic Disorder Labs:  Lab Results  Component Value Date   HGBA1C <4.2 (L) 08/28/2016   MPG <74 08/28/2016   MPG <77 01/30/2016   No results found for: PROLACTIN Lab Results  Component Value Date   CHOL 193 08/28/2016   TRIG 168 (H) 08/28/2016   HDL 61 08/28/2016   CHOLHDL 3.2 08/28/2016   VLDL 34 08/28/2016   LDLCALC 98 08/28/2016   LDLCALC 100 (H) 06/28/2008    See Psychiatric Specialty Exam and Suicide Risk Assessment completed by Attending Physician prior to discharge.  Discharge destination:  Home  Is patient on multiple antipsychotic therapies at discharge:  No   Has Patient had three or more failed trials of antipsychotic monotherapy by history:  No  Recommended Plan for Multiple Antipsychotic Therapies: NA     Medication List    TAKE these medications     Indication  amLODipine 10 MG tablet Commonly known as:  NORVASC Take 1 tablet (10 mg total) by mouth daily.  Indication:  High Blood Pressure Disorder   benazepril 10 MG tablet Commonly known as:  LOTENSIN Take 1 tablet (10 mg total) by mouth daily.  Indication:  High Blood Pressure Disorder   hydrochlorothiazide 12.5 MG capsule Commonly known as:  MICROZIDE Take 1 capsule (12.5 mg total) by mouth daily.  Indication:  High Blood Pressure Disorder   hydrOXYzine 25 MG tablet Commonly known as:  ATARAX/VISTARIL Take 1 tablet (25 mg total) by mouth every 6 (six) hours as needed for anxiety (or CIWA score </= 10).  Indication:  Anxiety Neurosis   methocarbamol 500 MG tablet Commonly known as:  ROBAXIN Take 1 tablet (500 mg total) by mouth 2 (two) times daily.  Indication:  Musculoskeletal Pain   QUEtiapine 300 MG tablet Commonly known as:  SEROQUEL Take 1 tablet (300 mg total) by mouth at bedtime.  Indication:  psychosis   sertraline 50 MG tablet Commonly known as:  ZOLOFT Take 1 tablet (50 mg total) by mouth daily. Start taking on:  09/01/2016   Indication:  MDD   thiamine 100 MG tablet Take 1 tablet (100 mg total) by mouth daily. Start taking on:  09/01/2016  Indication:  Deficiency in Thiamine or Vitamin B1      Follow-up Information    [facility] .   Specialty:  Behavioral Health Why:  Call Jashella at [phone] to find out when your hospital follow up appointment is.  She will help with transportation too if you desire. Contact information: [address]  [place] [address] [phone]           Follow-up recommendations:  Activity:  As tolerated Diet:  Heart healthy with low sodium  Comments:   Take all medications as prescribed. Keep all follow-up appointments as scheduled.  Do not consume alcohol or use illegal drugs while on prescription medications. Report any adverse effects from your medications to your primary care provider promptly.  In the event of recurrent symptoms or worsening symptoms, call 911, a crisis hotline, or go to the nearest emergency department for evaluation.   Signed: Benjamine Mola, FNP 08/31/2016, 11:30 AM

## 2016-08-31 NOTE — BHH Suicide Risk Assessment (Signed)
[facility] Discharge Suicide Risk Assessment   Principal Problem: Schizoaffective disorder, depressive type [facility]) Discharge Diagnoses:  Patient Active Problem List   Diagnosis Date Noted  . Schizoaffective disorder, depressive type ([place]) [F25.1] 08/27/2016  . Cocaine use disorder, mild, abuse [F14.10] 08/27/2016  . Essential hypertension [I10] 08/27/2016  . Chronic headache [R51] 08/27/2016  . Suicidal ideation [R45.851] 02/03/2016  . Acute encephalopathy [G93.40] 01/30/2016  . CKD (chronic kidney disease) stage 4, GFR 15-29 ml/min (HCC) [N18.4] 01/30/2016  . Benign essential HTN [I10] 01/30/2016  . Cocaine abuse [F14.10] 08/21/2012  . Hypokalemia [E87.6] 08/21/2012    Total Time spent with patient: 30 minutes  Musculoskeletal: Strength & Muscle Tone: within normal limits Gait & Station: normal Patient leans: N/A  Psychiatric Specialty Exam: Review of Systems  Psychiatric/Behavioral: Negative for depression, hallucinations and suicidal ideas. The patient is not nervous/anxious and does not have insomnia.   All other systems reviewed and are negative.   Blood pressure (!) 130/97, pulse 74, temperature 97.8 F (36.6 C), resp. rate 20, height 5\' 8"  (1.727 m), weight 86.4 kg (190 lb 8 oz), SpO2 100 %.Body mass index is 28.97 kg/m.  General Appearance: Casual  Eye Contact::  Fair  Speech:  Clear and Coherent409  Volume:  Normal  Mood:  Euthymic  Affect:  Appropriate  Thought Process:  Goal Directed and Descriptions of Associations: Intact  Orientation:  Full (Time, Place, and Person)  Thought Content:  Logical  Suicidal Thoughts:  No  Homicidal Thoughts:  No  Memory:  Immediate;   Fair Recent;   Fair Remote;   Fair  Judgement:  Fair  Insight:  Fair  Psychomotor Activity:  Normal  Concentration:  Fair  Recall:  [employer] of Knowledge:Fair  Language: Fair  Akathisia:  No  Handed:  Right  AIMS (if indicated):     Assets:  Desire for Improvement  Sleep:  Number of Hours:  6.5  Cognition: WNL  ADL's:  Intact   Mental Status Per Nursing Assessment::   On Admission:  Suicidal ideation indicated by patient, Suicide plan, Plan includes specific time, place, or method, Self-harm thoughts, Self-harm behaviors  Demographic Factors:  Male, Low socioeconomic status and Unemployed  Loss Factors: Decrease in vocational status  Historical Factors: Impulsivity  Risk Reduction Factors:   Positive social support  Continued Clinical Symptoms:  Previous Psychiatric Diagnoses and Treatments  Cognitive Features That Contribute To Risk:  None    Suicide Risk:  Minimal: No identifiable suicidal ideation.  Patients presenting with no risk factors but with morbid ruminations; may be classified as minimal risk based on the severity of the depressive symptoms  Follow-up Information    [facility] Follow up on 08/30/2016.   Specialty:  [facility] information: [place] [address] [phone]           Plan Of Care/Follow-up recommendations:  Activity:  no restrictions Diet:  regular Tests:  please follow up with your PMD for your renal functions as recommended by hospitalist Other:  none  Aishia Barkey, MD 08/31/2016, 9:27 AM

## 2016-08-31 NOTE — Progress Notes (Signed)
Recreation Therapy Notes  Date: 08/31/16 Time: 1000 Location: [address] Dayroom  Group Topic: Coping Skills  Goal Area(s) Addresses:  Pt will able to identify positive coping skills. Pt will be able to identify the importance of coping skills. Pt will be abel to identify how using coping skills will help them post d/c.  Behavioral Response: Engaged  Intervention: Worksheet, scissors, glue sticks, magazines  Activity: My Geologist, engineering.  Patients were given worksheets divided into five areas where coping skills can be used.  The areas were diversions, social, cognitive, tension releasers and physical.  Patients were to use magazines to cut out pictures that depict coping skills that could be used in each area.  Patients were to come up with at least two coping skills per area.   Education: Radiographer, therapeutic, Dentist.   Education Outcome: Acknowledges understanding/In group clarification offered/Needs additional education.   Clinical Observations/Feedback: Pt was active and engaged.  Pt described coping skills as "things we do to help deal with problems".  Some of the coping skills pt identified were exercise and spending time with significant other.  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   Victorino Sparrow, LRT/CTRS     Victorino Sparrow A 08/31/2016 11:36 AM

## 2016-10-06 ENCOUNTER — Encounter (HOSPITAL_COMMUNITY): Payer: Self-pay | Admitting: *Deleted

## 2016-10-06 ENCOUNTER — Inpatient Hospital Stay (HOSPITAL_COMMUNITY)
Admission: EM | Admit: 2016-10-06 | Discharge: 2016-10-13 | DRG: 918 | Disposition: A | Discharge status: Home / Self Care | Payer: Medicaid Other | Attending: Internal Medicine | Admitting: Internal Medicine

## 2016-10-06 DIAGNOSIS — T50902D Poisoning by unspecified drugs, medicaments and biological substances, intentional self-harm, subsequent encounter: Secondary | ICD-10-CM | POA: Diagnosis not present

## 2016-10-06 DIAGNOSIS — Z79899 Other long term (current) drug therapy: Secondary | ICD-10-CM

## 2016-10-06 DIAGNOSIS — F259 Schizoaffective disorder, unspecified: Secondary | ICD-10-CM | POA: Diagnosis not present

## 2016-10-06 DIAGNOSIS — I129 Hypertensive chronic kidney disease with stage 1 through stage 4 chronic kidney disease, or unspecified chronic kidney disease: Secondary | ICD-10-CM | POA: Diagnosis present

## 2016-10-06 DIAGNOSIS — F1410 Cocaine abuse, uncomplicated: Secondary | ICD-10-CM | POA: Diagnosis present

## 2016-10-06 DIAGNOSIS — E569 Vitamin deficiency, unspecified: Secondary | ICD-10-CM

## 2016-10-06 DIAGNOSIS — E869 Volume depletion, unspecified: Secondary | ICD-10-CM | POA: Diagnosis present

## 2016-10-06 DIAGNOSIS — F319 Bipolar disorder, unspecified: Secondary | ICD-10-CM | POA: Diagnosis present

## 2016-10-06 DIAGNOSIS — T50902A Poisoning by unspecified drugs, medicaments and biological substances, intentional self-harm, initial encounter: Secondary | ICD-10-CM

## 2016-10-06 DIAGNOSIS — W010XXA Fall on same level from slipping, tripping and stumbling without subsequent striking against object, initial encounter: Secondary | ICD-10-CM | POA: Diagnosis not present

## 2016-10-06 DIAGNOSIS — T50904A Poisoning by unspecified drugs, medicaments and biological substances, undetermined, initial encounter: Secondary | ICD-10-CM | POA: Diagnosis not present

## 2016-10-06 DIAGNOSIS — T1491XA Suicide attempt, initial encounter: Secondary | ICD-10-CM | POA: Diagnosis not present

## 2016-10-06 DIAGNOSIS — R509 Fever, unspecified: Secondary | ICD-10-CM

## 2016-10-06 DIAGNOSIS — N181 Chronic kidney disease, stage 1: Secondary | ICD-10-CM | POA: Diagnosis present

## 2016-10-06 DIAGNOSIS — F17210 Nicotine dependence, cigarettes, uncomplicated: Secondary | ICD-10-CM | POA: Diagnosis present

## 2016-10-06 DIAGNOSIS — R2681 Unsteadiness on feet: Secondary | ICD-10-CM | POA: Diagnosis not present

## 2016-10-06 DIAGNOSIS — T43592A Poisoning by other antipsychotics and neuroleptics, intentional self-harm, initial encounter: Secondary | ICD-10-CM | POA: Diagnosis not present

## 2016-10-06 DIAGNOSIS — E876 Hypokalemia: Secondary | ICD-10-CM | POA: Diagnosis present

## 2016-10-06 DIAGNOSIS — E878 Other disorders of electrolyte and fluid balance, not elsewhere classified: Secondary | ICD-10-CM | POA: Diagnosis not present

## 2016-10-06 DIAGNOSIS — Z720 Tobacco use: Secondary | ICD-10-CM

## 2016-10-06 DIAGNOSIS — T50901A Poisoning by unspecified drugs, medicaments and biological substances, accidental (unintentional), initial encounter: Secondary | ICD-10-CM | POA: Diagnosis present

## 2016-10-06 DIAGNOSIS — Z87891 Personal history of nicotine dependence: Secondary | ICD-10-CM

## 2016-10-06 DIAGNOSIS — E872 Acidosis: Secondary | ICD-10-CM | POA: Diagnosis present

## 2016-10-06 DIAGNOSIS — N179 Acute kidney failure, unspecified: Secondary | ICD-10-CM | POA: Diagnosis not present

## 2016-10-06 DIAGNOSIS — Y92230 Patient room in hospital as the place of occurrence of the external cause: Secondary | ICD-10-CM | POA: Diagnosis not present

## 2016-10-06 DIAGNOSIS — Z8249 Family history of ischemic heart disease and other diseases of the circulatory system: Secondary | ICD-10-CM | POA: Diagnosis not present

## 2016-10-06 DIAGNOSIS — G9340 Encephalopathy, unspecified: Secondary | ICD-10-CM | POA: Diagnosis present

## 2016-10-06 DIAGNOSIS — Y92009 Unspecified place in unspecified non-institutional (private) residence as the place of occurrence of the external cause: Secondary | ICD-10-CM

## 2016-10-06 LAB — COMPREHENSIVE METABOLIC PANEL
ALBUMIN: 3.9 g/dL (ref 3.5–5.0)
ALK PHOS: 56 U/L (ref 38–126)
ALK PHOS: 59 U/L (ref 38–126)
ALT: 20 U/L (ref 17–63)
ALT: 19 U/L (ref 17–63)
ANION GAP: 10 (ref 5–15)
ANION GAP: 6 (ref 5–15)
AST: 29 U/L (ref 15–41)
AST: 26 U/L (ref 15–41)
Albumin: 4 g/dL (ref 3.5–5.0)
BILIRUBIN TOTAL: 1.1 mg/dL (ref 0.3–1.2)
BUN: 23 mg/dL — ABNORMAL HIGH (ref 6–20)
BUN: 17 mg/dL (ref 6–20)
CALCIUM: 8.9 mg/dL (ref 8.9–10.3)
CALCIUM: 9 mg/dL (ref 8.9–10.3)
CO2: 20 mmol/L — ABNORMAL LOW (ref 22–32)
CO2: 24 mmol/L (ref 22–32)
Chloride: 109 mmol/L (ref 101–111)
Chloride: 111 mmol/L (ref 101–111)
Creatinine, Ser: 1.8 mg/dL — ABNORMAL HIGH (ref 0.61–1.24)
Creatinine, Ser: 1.75 mg/dL — ABNORMAL HIGH (ref 0.61–1.24)
GFR calc Af Amer: 47 mL/min — ABNORMAL LOW (ref >60)
GFR calc non Af Amer: 40 mL/min — ABNORMAL LOW (ref >60)
GFR calc non Af Amer: 41 mL/min — ABNORMAL LOW (ref >60)
GFR, EST AFRICAN AMERICAN: 46 mL/min — AB (ref >60)
GLUCOSE: 97 mg/dL (ref 65–99)
Glucose, Bld: 133 mg/dL — ABNORMAL HIGH (ref 65–99)
POTASSIUM: 2.9 mmol/L — AB (ref 3.5–5.1)
Potassium: 3.9 mmol/L (ref 3.5–5.1)
SODIUM: 141 mmol/L (ref 135–145)
Sodium: 139 mmol/L (ref 135–145)
TOTAL PROTEIN: 6.8 g/dL (ref 6.5–8.1)
Total Bilirubin: 1.1 mg/dL (ref 0.3–1.2)
Total Protein: 7.3 g/dL (ref 6.5–8.1)

## 2016-10-06 LAB — ACETAMINOPHEN LEVEL: Acetaminophen (Tylenol), Serum: <10 ug/mL — ABNORMAL LOW (ref 10–30)

## 2016-10-06 LAB — RAPID URINE DRUG SCREEN, HOSP PERFORMED
AMPHETAMINES: NOT DETECTED
BENZODIAZEPINES: NOT DETECTED
Barbiturates: NOT DETECTED
COCAINE: POSITIVE — AB
OPIATES: NOT DETECTED
Tetrahydrocannabinol: NOT DETECTED

## 2016-10-06 LAB — CBC
HCT: 35.6 % — ABNORMAL LOW (ref 39.0–52.0)
HEMOGLOBIN: 12.3 g/dL — AB (ref 13.0–17.0)
MCH: 27.7 pg (ref 26.0–34.0)
MCHC: 34.6 g/dL (ref 30.0–36.0)
MCV: 80.2 fL (ref 78.0–100.0)
PLATELETS: 264 10*3/uL (ref 150–400)
RBC: 4.44 MIL/uL (ref 4.22–5.81)
RDW: 14.3 % (ref 11.5–15.5)
WBC: 8.4 10*3/uL (ref 4.0–10.5)

## 2016-10-06 LAB — PHOSPHORUS: Phosphorus: 1.8 mg/dL — ABNORMAL LOW (ref 2.5–4.6)

## 2016-10-06 LAB — MAGNESIUM: Magnesium: 2.3 mg/dL (ref 1.7–2.4)

## 2016-10-06 LAB — ETHANOL

## 2016-10-06 LAB — SALICYLATE LEVEL

## 2016-10-06 MED ORDER — ENOXAPARIN SODIUM 40 MG/0.4ML ~~LOC~~ SOLN
40.0000 mg | SUBCUTANEOUS | Status: DC
  Administered 2016-10-06 – 2016-10-12 (×6): 40 mg via SUBCUTANEOUS
  Filled 2016-10-06 (×7): qty 0.4

## 2016-10-06 MED ORDER — BENAZEPRIL HCL 10 MG PO TABS
10.0000 mg | ORAL_TABLET | Freq: Every day | ORAL | Status: DC
  Filled 2016-10-06: qty 1

## 2016-10-06 MED ORDER — LORAZEPAM 2 MG/ML IJ SOLN
1.0000 mg | INTRAMUSCULAR | Status: DC
  Administered 2016-10-06 – 2016-10-07 (×6): 2 mg via INTRAVENOUS
  Filled 2016-10-06 (×6): qty 1

## 2016-10-06 MED ORDER — AMLODIPINE BESYLATE 10 MG PO TABS
10.0000 mg | ORAL_TABLET | Freq: Every day | ORAL | Status: DC
  Filled 2016-10-06: qty 1

## 2016-10-06 MED ORDER — POTASSIUM CHLORIDE CRYS ER 20 MEQ PO TBCR
40.0000 meq | EXTENDED_RELEASE_TABLET | Freq: Once | ORAL | Status: AC
  Administered 2016-10-06: 40 meq via ORAL
  Filled 2016-10-06: qty 2

## 2016-10-06 MED ORDER — SODIUM CHLORIDE 0.9 % IV BOLUS (SEPSIS)
1000.0000 mL | Freq: Once | INTRAVENOUS | Status: AC
  Administered 2016-10-06: 1000 mL via INTRAVENOUS

## 2016-10-06 NOTE — ED Notes (Signed)
Patient was feeling more depressed than usually and took extra medication.

## 2016-10-06 NOTE — Progress Notes (Signed)
Spoke with Langley Gauss from [employer] for follow up on pt's status, was instructed that patient needs to have acetaminophen and salicylate levels drawn, repeat EKG and monitor QTC, if greater than 500, Magnesium and Potassium levels should be checked and replaced if needed, paged MD to make aware

## 2016-10-06 NOTE — ED Notes (Signed)
Poison control Called.  Recommendations are symptomatic treatment. Monitor for hypotension, tachycardia, renal issues.  Provider Notified

## 2016-10-06 NOTE — Consult Note (Signed)
Darryl Parsons   Reason for Parsons:  Schizophrenia, intentional overdose Referring Physician:  Dr. Verlon Au Patient Identification: Darryl Parsons MRN:  [id number] Principal Diagnosis: <principal problem not specified> Diagnosis:   Patient Active Problem List   Diagnosis Date Noted  . Overdose [T50.901A] 10/06/2016  . Schizoaffective disorder, depressive type ([place]) [F25.1] 08/27/2016  . Cocaine use disorder, mild, abuse [F14.10] 08/27/2016  . Essential hypertension [I10] 08/27/2016  . Chronic headache [R51] 08/27/2016  . Suicidal ideation [R45.851] 02/03/2016  . Acute encephalopathy [G93.40] 01/30/2016  . CKD (chronic kidney disease) stage 4, GFR 15-29 ml/min (HCC) [N18.4] 01/30/2016  . Benign essential HTN [I10] 01/30/2016  . Cocaine abuse [F14.10] 08/21/2012  . Hypokalemia [E87.6] 08/21/2012    Total Time spent with patient: 45 minutes  Subjective:   Darryl Parsons is a 60 y.o. male patient admitted with intentional drug overdose  HPI: Darryl Parsons is a 60 years old male with a history of schizophrenia versus schizoaffective disorder, polysubstance abuse admitted to [facility] as a status post intentional drug overdose as a suicide attempt. Patient seen, chart reviewed for the face-to-face psychiatric consultation and evaluation. Patient is a poor historian reportedly he overdosed on his medication Seroquel 300 mg taken 10 of them and required 4 point restraints because of increased agitation. Reportedly patient is married and unemployed, lives with his wife in [place]. Patient has a previous suicidal attempt required acute psychiatric hospitalization from October 12 to 08/31/2016.    Past Psychiatric History: Schizoaffective disorder and polysubstance abuse including cocaine abuse  Risk to Self: Is patient at risk for suicide?: No Risk to Others:   Prior Inpatient Therapy:   Prior Outpatient Therapy:    Past Medical History:  Past  Medical History:  Diagnosis Date  . Chronic kidney disease   . Headache   . Hypertension   . Low back pain   . Mental disorder    History reviewed. No pertinent surgical history. Family History:  Family History  Problem Relation Age of Onset  . Cardiomyopathy Sister   . Lupus Sister   . Kidney failure Sister   . Factor VIII deficiency Neg Hx   . Mental illness Neg Hx    Family Psychiatric  History: Noncontributory Social History:  History  Alcohol Use  . Yes    Comment: 1-2 beers a month     History  Drug Use  . Types: Cocaine    Social History   Social History  . Marital status: Single    Spouse name: N/A  . Number of children: N/A  . Years of education: N/A   Social History Main Topics  . Smoking status: Current Every Day Smoker    Packs/day: 0.50    Types: Cigarettes  . Smokeless tobacco: Never Used  . Alcohol use Yes     Comment: 1-2 beers a month  . Drug use:     Types: Cocaine  . Sexual activity: No   Other Topics Concern  . None   Social History Narrative  . None   Additional Social History:    Allergies:   Allergies  Allergen Reactions  . Other     Pt states he is allergic to a blood pressure pill. He is unable to state reaction. He is spelling it adelatt and aderlatt.    Labs:  Results for orders placed or performed during the hospital encounter of 10/06/16 (from the past 48 hour(s))  Comprehensive metabolic panel     Status:  Abnormal   Collection Time: 10/06/16  6:24 AM  Result Value Ref Range   Sodium 139 135 - 145 mmol/L   Potassium 2.9 (L) 3.5 - 5.1 mmol/L   Chloride 109 101 - 111 mmol/L   CO2 20 (L) 22 - 32 mmol/L   Glucose, Bld 133 (H) 65 - 99 mg/dL   BUN 23 (H) 6 - 20 mg/dL   Creatinine, Ser 1.80 (H) 0.61 - 1.24 mg/dL   Calcium 8.9 8.9 - 10.3 mg/dL   Total Protein 6.8 6.5 - 8.1 g/dL   Albumin 4.0 3.5 - 5.0 g/dL   AST 29 15 - 41 U/L   ALT 20 17 - 63 U/L   Alkaline Phosphatase 56 38 - 126 U/L   Total Bilirubin 1.1 0.3 -  1.2 mg/dL   GFR calc non Af Amer 40 (L) >60 mL/min   GFR calc Af Amer 46 (L) >60 mL/min    Comment: (NOTE) The eGFR has been calculated using the CKD EPI equation. This calculation has not been validated in all clinical situations. eGFR's persistently <60 mL/min signify possible Chronic Kidney Disease.    Anion gap 10 5 - 15  Ethanol     Status: None   Collection Time: 10/06/16  6:24 AM  Result Value Ref Range   Alcohol, Ethyl (B) <5 <5 mg/dL    Comment:        LOWEST DETECTABLE LIMIT FOR SERUM ALCOHOL IS 5 mg/dL FOR MEDICAL PURPOSES ONLY   cbc     Status: Abnormal   Collection Time: 10/06/16  6:24 AM  Result Value Ref Range   WBC 8.4 4.0 - 10.5 K/uL   RBC 4.44 4.22 - 5.81 MIL/uL   Hemoglobin 12.3 (L) 13.0 - 17.0 g/dL   HCT 35.6 (L) 39.0 - 52.0 %   MCV 80.2 78.0 - 100.0 fL   MCH 27.7 26.0 - 34.0 pg   MCHC 34.6 30.0 - 36.0 g/dL   RDW 14.3 11.5 - 15.5 %   Platelets 264 150 - 400 K/uL    No current facility-administered medications for this encounter.    Current Outpatient Prescriptions  Medication Sig Dispense Refill  . amLODipine (NORVASC) 10 MG tablet Take 1 tablet (10 mg total) by mouth daily. 30 tablet 0  . benazepril (LOTENSIN) 10 MG tablet Take 1 tablet (10 mg total) by mouth daily. 30 tablet 0  . hydrochlorothiazide (MICROZIDE) 12.5 MG capsule Take 1 capsule (12.5 mg total) by mouth daily. 30 capsule 0  . hydrOXYzine (ATARAX/VISTARIL) 25 MG tablet Take 1 tablet (25 mg total) by mouth every 6 (six) hours as needed for anxiety (or CIWA score </= 10). 30 tablet 0  . methocarbamol (ROBAXIN) 500 MG tablet Take 1 tablet (500 mg total) by mouth 2 (two) times daily. (Patient not taking: Reported on 08/26/2016) 10 tablet 0  . QUEtiapine (SEROQUEL) 300 MG tablet Take 1 tablet (300 mg total) by mouth at bedtime. 30 tablet 0  . sertraline (ZOLOFT) 50 MG tablet Take 1 tablet (50 mg total) by mouth daily. 30 tablet 0  . thiamine 100 MG tablet Take 1 tablet (100 mg total) by mouth  daily. 30 tablet 0    Musculoskeletal: Strength & Muscle Tone: decreased Gait & Station: unable to stand Patient leans: N/A  Psychiatric Specialty Exam: Physical Exam as per history and physical   ROS unable to obtain secondary to poor historian/altered mental status   Blood pressure 117/85, pulse 99, temperature 97.8 F (36.6 C), temperature source  Oral, resp. rate 15, height '5\' 10"'  (1.778 m), weight 79.4 kg (175 lb), SpO2 99 %.Body mass index is 25.11 kg/m.  General Appearance: Guarded  Eye Contact:  Fair  Speech:  Slow and Slurred  Volume:  Decreased  Mood:  Anxious and Depressed  Affect:  Constricted and Depressed  Thought Process:  Coherent  Orientation:  Full (Time, Place, and Person)  Thought Content:  Paranoid Ideation and Rumination  Suicidal Thoughts:  Yes.  with intent/plan  Homicidal Thoughts:  No  Memory:  Immediate;   Fair Recent;   Fair Remote;   Poor  Judgement:  Impaired  Insight:  Shallow  Psychomotor Activity:  Decreased  Concentration:  Concentration: Fair and Attention Span: Poor  Recall:  Poor  Fund of Knowledge:  Poor  Language:  Fair  Akathisia:  Negative  Handed:  Right  AIMS (if indicated):     Assets:  Catering manager Housing Intimacy Leisure Time Resilience Social Support  ADL's:  Impaired  Cognition:  Impaired,  Mild  Sleep:        Treatment Plan Summary: 60 years old male with schizoaffective disorder, polysubstance abuse admitted for status post intentional drug overdose and has recurrent episodes which required acute psychiatric hospitalization in the past. Patient is currently restrained both upper extremities and lower extremities secondary to agitation, patient meet criteria for acute psychiatric hospitalization when medically stable.   Continue safety sitter Discontinue Seroquel May start Zyprexa 5 mg IM every 8 hours as needed for agitation and aggressive behaviors Monitor EKG for acute is a prolongation Daily  contact with patient to assess and evaluate symptoms and progress in treatment and Medication management  Disposition: Recommend psychiatric Inpatient admission when medically cleared. Supportive therapy provided about ongoing stressors.  Ambrose Finland, MD 10/06/2016 11:24 AM

## 2016-10-06 NOTE — Progress Notes (Signed)
Spoke with [employer] about patient. Concerned that the patient was admitted with overdose and has a history of multiple overdoses and self harm, as well as bipolar disorder and depression, per H&P note. MD gave verbal order to bedside RN for a sitter, but it was unclear if he intended for a safety sitter vs suicide sitter. Attempted to page MD for clarification, but received no call back. MD did tell bedside RN that he was going to consult Psych MD as well. After speaking with the [employer] it was decided that the patient should be on suicide precautions with a sitter unless the MD states in the chart that the patient is not at risk for self harm. MD paged to make aware of initiation of suicide precautions. Will continue to monitor patient for safety.   Darryl Parsons [facility] 10/06/2016 3:08 PM

## 2016-10-06 NOTE — ED Notes (Signed)
Patient takwen to room on 4E report given to charge RN

## 2016-10-06 NOTE — ED Triage Notes (Signed)
Patient is alert and oriented x4.  His girlfriend called EMS after patient informed her that he had taken 8 to 10 Seroquel.  Patient states that he was overly depressed and annoyed with people and wanted rest.

## 2016-10-06 NOTE — ED Notes (Signed)
Bed: PS88 Expected date:  Expected time:  Means of arrival:  Comments: Ingested 6 trazodone

## 2016-10-06 NOTE — H&P (Signed)
[facility] History and Physical  Darryl Parsons BJY:[id number] DOB: 1956/10/23 DOA: 10/06/2016  Referring physician: [facility] P PCP: PROVIDER NOT IN SYSTEM  Specialists: Psychiatry  Chief Complaint: OD on seroquel  HPI:   60 y/o ? Visual hallucinations Bipolar/Depression, ?major Cocaine abuse HTn CKD1 Prior GIB in the past 2/2 to internal hemorrhoids Recent admit to [facility] 10.12.--10.17 Has a repeated pattern of OD and ingestion and reported self-harm  Presented to ED with after his girlfriend informed EMS that he taken 8-10 300 mg Seroquel tablets No specific history can be taken from the patient as he awakens flails his hand and then promptly falls back to sleep Emergency room workup reveals a potassium of 2.9./Creatinine 23/1.8 which is better than his baseline of 25 1.9 in the past Hemoglobin is 12.3 Blood alcohol level is less than 5 and tox screen is pending   borderline left anterior fascicular block and ST-T wave slight elevations that are not new based on prior and borderline prolonged QTC at 478  TTS in the emergency room tried to evaluate the patient but he was too somnolent although he awoke enough to take by mouth potassium pills   Past Medical History:  Diagnosis Date  . Chronic kidney disease   . Headache   . Hypertension   . Low back pain   . Mental disorder    History reviewed. No pertinent surgical history. Social History:  Social History   Social History Narrative  . No narrative on file    Allergies  Allergen Reactions  . Other     Pt states he is allergic to a blood pressure pill. He is unable to state reaction. He is spelling it adelatt and aderlatt.    Family History  Problem Relation Age of Onset  . Cardiomyopathy Sister   . Lupus Sister   . Kidney failure Sister   . Factor VIII deficiency Neg Hx   . Mental illness Neg Hx    Prior to Admission medications   Medication Sig Start Date End Date Taking? Authorizing Provider   amLODipine (NORVASC) 10 MG tablet Take 1 tablet (10 mg total) by mouth daily. 08/31/16   Elyse Jarvis Withrow, FNP  benazepril (LOTENSIN) 10 MG tablet Take 1 tablet (10 mg total) by mouth daily. 08/31/16   Benjamine Mola, FNP  hydrochlorothiazide (MICROZIDE) 12.5 MG capsule Take 1 capsule (12.5 mg total) by mouth daily. 08/31/16   Benjamine Mola, FNP  hydrOXYzine (ATARAX/VISTARIL) 25 MG tablet Take 1 tablet (25 mg total) by mouth every 6 (six) hours as needed for anxiety (or CIWA score </= 10). 08/31/16   Benjamine Mola, FNP  methocarbamol (ROBAXIN) 500 MG tablet Take 1 tablet (500 mg total) by mouth 2 (two) times daily. Patient not taking: Reported on 08/26/2016 08/08/16   Roxanna Mew, PA-C  QUEtiapine (SEROQUEL) 300 MG tablet Take 1 tablet (300 mg total) by mouth at bedtime. 08/31/16   Benjamine Mola, FNP  sertraline (ZOLOFT) 50 MG tablet Take 1 tablet (50 mg total) by mouth daily. 09/01/16   Benjamine Mola, FNP  thiamine 100 MG tablet Take 1 tablet (100 mg total) by mouth daily. 09/01/16   Benjamine Mola, FNP   Physical Exam: Vitals:   10/06/16 0557 10/06/16 0636 10/06/16 0728 10/06/16 0850  BP:  138/83 142/95 154/99  Pulse:  91 83 88  Resp:  17 16 18   Temp:  97.5 F (36.4 C)  97.8 F (36.6 C)  TempSrc:  Oral  Oral  SpO2:  94% 93% 98%  Weight: 79.4 kg (175 lb)     Height: 5\' 10"  (1.778 m)       Arousable but not oriented and promptly falls back to sleep S1-S2 no murmur rub or gallop  Abdomen is soft Rest of exam is limited given patient's agitation when he wakes up and confusion    Liver Function Tests:  Recent Labs Lab 10/06/16 0624  AST 29  ALT 20  ALKPHOS 56  BILITOT 1.1  PROT 6.8  ALBUMIN 4.0   No results for input(s): LIPASE, AMYLASE in the last 168 hours. No results for input(s): AMMONIA in the last 168 hours. CBC:  Recent Labs Lab 10/06/16 0624  WBC 8.4  HGB 12.3*  HCT 35.6*  MCV 80.2  PLT 264   Cardiac Enzymes: No results for input(s): CKTOTAL,  CKMB, CKMBINDEX, TROPONINI in the last 168 hours.  BNP (last 3 results) No results for input(s): BNP in the last 8760 hours.  ProBNP (last 3 results) No results for input(s): PROBNP in the last 8760 hours.  CBG: No results for input(s): GLUCAP in the last 168 hours.  Radiological Exams on Admission: No results found.  EKG: Independently reviewed. reviewed  Assessment/Plan  Toxic metabolic encephalopathy tablet from overdose of Seroquel 300 mg daily at bedtime which was reported by girlfriend She is not available at the bedside and I cannot corroborate story Patient is also on sertraline 50 daily at bedtime as well as hydroxyzine 25 every 6 when necessary for anxiety or elevated alcohol score . We will hold all these medications until he is more awake  Overdose Borderline prolonged QTC   monitor on telemetry Monitor for side effects of tachyarrhythmia, anticholinergic effects and may take a diet if he awakens enough to  Substance abuse tox screen is pending  Hypertension with chronic kidney disease stage I Hypokalemia Continue amlodipine 10, bananas April 10-hold HCTZ 12.5 daily Check a.m. magnesium Replaced with oral potassium so recheck labs in a.m.  Observation I have consult psychiatry  Telemetry    If 7PM-7AM, please contact night-coverage [email or web address] Password [email or web address] 10/06/2016, 10:20 AM

## 2016-10-06 NOTE — ED Provider Notes (Signed)
[facility] DEPT Provider Note   CSN: [id number] Arrival date & time: 10/06/16  0555     History   Chief Complaint Chief Complaint  Patient presents with  . Drug Overdose    HPI Darryl Parsons is a 60 y.o. male.      The history is provided by the patient and medical records. No language interpreter was used.  Drug Overdose  Pertinent negatives include no abdominal pain, no headaches and no shortness of breath.   Darryl Parsons is a 60 y.o. male  with a PMH of schizoaffective disorder, CKD, HTN, who presents to the Emergency Department for evaluation after intentional overdose. Patient states that he took 7-8 of his home Seroquel because he was depressed. Believes ingestion was around an hour pta but not positive. Denies any other drug or ETOH use today or yesterday. He states that he does not want to kill himself, but was just very depressed and tired of dealing with people. Denies homicidal ideations, auditory/visual hallucinations. Complaining of dry mouth but otherwise no complaints. No abdominal pain, chest pain, palpitations, shortness of breath, back pain, urinary symptoms.   Past Medical History:  Diagnosis Date  . Chronic kidney disease   . Headache   . Hypertension   . Low back pain   . Mental disorder     Patient Active Problem List   Diagnosis Date Noted  . Overdose 10/06/2016  . Schizoaffective disorder, depressive type ([place]) 08/27/2016  . Cocaine use disorder, mild, abuse 08/27/2016  . Essential hypertension 08/27/2016  . Chronic headache 08/27/2016  . Suicidal ideation 02/03/2016  . Acute encephalopathy 01/30/2016  . CKD (chronic kidney disease) stage 4, GFR 15-29 ml/min (HCC) 01/30/2016  . Benign essential HTN 01/30/2016  . Cocaine abuse 08/21/2012  . Hypokalemia 08/21/2012    History reviewed. No pertinent surgical history.     Home Medications    Prior to Admission medications   Medication Sig Start Date End Date Taking? Authorizing  Provider  amLODipine (NORVASC) 10 MG tablet Take 1 tablet (10 mg total) by mouth daily. 08/31/16   Elyse Jarvis Withrow, FNP  benazepril (LOTENSIN) 10 MG tablet Take 1 tablet (10 mg total) by mouth daily. 08/31/16   Benjamine Mola, FNP  hydrochlorothiazide (MICROZIDE) 12.5 MG capsule Take 1 capsule (12.5 mg total) by mouth daily. 08/31/16   Benjamine Mola, FNP  hydrOXYzine (ATARAX/VISTARIL) 25 MG tablet Take 1 tablet (25 mg total) by mouth every 6 (six) hours as needed for anxiety (or CIWA score </= 10). 08/31/16   Benjamine Mola, FNP  methocarbamol (ROBAXIN) 500 MG tablet Take 1 tablet (500 mg total) by mouth 2 (two) times daily. Patient not taking: Reported on 08/26/2016 08/08/16   Roxanna Mew, PA-C  QUEtiapine (SEROQUEL) 300 MG tablet Take 1 tablet (300 mg total) by mouth at bedtime. 08/31/16   Benjamine Mola, FNP  sertraline (ZOLOFT) 50 MG tablet Take 1 tablet (50 mg total) by mouth daily. 09/01/16   Benjamine Mola, FNP  thiamine 100 MG tablet Take 1 tablet (100 mg total) by mouth daily. 09/01/16   Benjamine Mola, FNP    Family History Family History  Problem Relation Age of Onset  . Cardiomyopathy Sister   . Lupus Sister   . Kidney failure Sister   . Factor VIII deficiency Neg Hx   . Mental illness Neg Hx     Social History Social History  Substance Use Topics  . Smoking status: Current Every Day  Smoker    Packs/day: 0.50    Types: Cigarettes  . Smokeless tobacco: Never Used  . Alcohol use Yes     Comment: 1-2 beers a month     Allergies   Other   Review of Systems Review of Systems  Constitutional: Negative for fever.  HENT: Negative for congestion.   Respiratory: Negative for shortness of breath.   Cardiovascular: Negative.   Gastrointestinal: Negative for abdominal pain, nausea and vomiting.  Genitourinary: Negative for difficulty urinating.  Musculoskeletal: Negative for back pain.  Skin: Negative for rash.  Neurological: Negative for headaches.    Psychiatric/Behavioral: Positive for dysphoric mood.     Physical Exam Updated Vital Signs BP 154/99 (BP Location: Left Arm)   Pulse 88   Temp 97.8 F (36.6 C) (Oral)   Resp 18   Ht 5\' 10"  (1.778 m)   Wt 79.4 kg   SpO2 98%   BMI 25.11 kg/m   Physical Exam  Constitutional: He is oriented to person, place, and time. He appears well-developed and well-nourished. No distress.  Somnolent.   HENT:  Head: Normocephalic and atraumatic.  Cardiovascular: Normal rate, regular rhythm and normal heart sounds.   No murmur heard. Pulmonary/Chest: Effort normal and breath sounds normal. No respiratory distress. He has no wheezes. He has no rales.  Abdominal: Soft. He exhibits no distension. There is no tenderness.  Musculoskeletal: He exhibits no edema.  Neurological: He is alert and oriented to person, place, and time.  Slurred speech. Able to follow simple commands.  CN 2-12 grossly intact.   Skin: Skin is warm and dry.  Nursing note and vitals reviewed.    ED Treatments / Results  Labs (all labs ordered are listed, but only abnormal results are displayed) Labs Reviewed  COMPREHENSIVE METABOLIC PANEL - Abnormal; Notable for the following:       Result Value   Potassium 2.9 (*)    CO2 20 (*)    Glucose, Bld 133 (*)    BUN 23 (*)    Creatinine, Ser 1.80 (*)    GFR calc non Af Amer 40 (*)    GFR calc Af Amer 46 (*)    All other components within normal limits  CBC - Abnormal; Notable for the following:    Hemoglobin 12.3 (*)    HCT 35.6 (*)    All other components within normal limits  ETHANOL  RAPID URINE DRUG SCREEN, HOSP PERFORMED    EKG  EKG Interpretation  Date/Time:  Wednesday October 06 2016 07:27:42 EST Ventricular Rate:  85 PR Interval:    QRS Duration: 97 QT Interval:  402 QTC Calculation: 478 R Axis:   -33 Text Interpretation:  Sinus rhythm Borderline prolonged PR interval Probable left atrial enlargement Left axis deviation Abnormal R-wave progression,  early transition Borderline prolonged QT interval Confirmed by ZAMMIT  MD, JOSEPH ([id number]) on 10/06/2016 10:15:02 AM       Radiology No results found.  Procedures Procedures (including critical care time)  Medications Ordered in ED Medications  sodium chloride 0.9 % bolus 1,000 mL (0 mLs Intravenous Stopped 10/06/16 1010)  potassium chloride SA (K-DUR,KLOR-CON) CR tablet 40 mEq (40 mEq Oral Given 10/06/16 0737)     Initial Impression / Assessment and Plan / ED Course  I have reviewed the triage vital signs and the nursing notes.  Pertinent labs & imaging results that were available during my care of the patient were reviewed by me and considered in my medical decision making (see chart  for details).  Clinical Course    Darryl Parsons is a 60 y.o. male who presents to ED for intentional Seroquel overdose (7-8 300mg  tablets). Unsure of when ingestion truly took place but patient believes around an hour prior to his arrival. On exam, patient is hemodynamically stable. Falls asleep throughout exam but will follow simple commands. Will need TTS at some point when mental status improves given intentional overdose.   6:48 AM - Discussed with poison control who recommends EKG (esp. Monitoring QT), labs and monitor for anticholinergic effects especially tachycardia, hypotension.   Labs reviewed: K+ of 2.9 - repleted in ED orally and patient tolerated medication without difficulty.  EKG with qtc of 478 - on cardiac monitoring.   10:05 AM - After four hours in the department, patient still somnolent and not back to baseline mental status. Will consult hospitalist.  Hospitalist to admit.   Patient discussed with Dr. Roderic Palau who agrees with treatment plan.   Final Clinical Impressions(s) / ED Diagnoses   Final diagnoses:  Intentional drug overdose, initial encounter [facility])  Hypokalemia    New Prescriptions New Prescriptions   No medications on file        [facility] Jaeline Whobrey,  PA-C 10/06/16 [place], MD 10/07/16 1450

## 2016-10-06 NOTE — ED Notes (Signed)
Patient refuses to wear tele monitor

## 2016-10-06 NOTE — ED Notes (Signed)
Pt is unable to urinate at this time. When I asked the pt to go for me he stated he was already wet.

## 2016-10-06 NOTE — Progress Notes (Signed)
Received patient from ED, VS obtained with difficulty, pt arousable but agitated, refused skin assessment, charge nurse aware, bed alarm intact, call light in reach

## 2016-10-07 DIAGNOSIS — E876 Hypokalemia: Secondary | ICD-10-CM

## 2016-10-07 DIAGNOSIS — T50902D Poisoning by unspecified drugs, medicaments and biological substances, intentional self-harm, subsequent encounter: Secondary | ICD-10-CM | POA: Diagnosis not present

## 2016-10-07 DIAGNOSIS — F1410 Cocaine abuse, uncomplicated: Secondary | ICD-10-CM | POA: Diagnosis present

## 2016-10-07 DIAGNOSIS — E872 Acidosis: Secondary | ICD-10-CM | POA: Diagnosis present

## 2016-10-07 DIAGNOSIS — F17210 Nicotine dependence, cigarettes, uncomplicated: Secondary | ICD-10-CM | POA: Diagnosis present

## 2016-10-07 DIAGNOSIS — T1491XA Suicide attempt, initial encounter: Secondary | ICD-10-CM | POA: Diagnosis not present

## 2016-10-07 DIAGNOSIS — E878 Other disorders of electrolyte and fluid balance, not elsewhere classified: Secondary | ICD-10-CM | POA: Diagnosis not present

## 2016-10-07 DIAGNOSIS — I129 Hypertensive chronic kidney disease with stage 1 through stage 4 chronic kidney disease, or unspecified chronic kidney disease: Secondary | ICD-10-CM | POA: Diagnosis present

## 2016-10-07 DIAGNOSIS — Y92230 Patient room in hospital as the place of occurrence of the external cause: Secondary | ICD-10-CM | POA: Diagnosis not present

## 2016-10-07 DIAGNOSIS — F17200 Nicotine dependence, unspecified, uncomplicated: Secondary | ICD-10-CM | POA: Diagnosis not present

## 2016-10-07 DIAGNOSIS — R509 Fever, unspecified: Secondary | ICD-10-CM | POA: Diagnosis not present

## 2016-10-07 DIAGNOSIS — W010XXA Fall on same level from slipping, tripping and stumbling without subsequent striking against object, initial encounter: Secondary | ICD-10-CM | POA: Diagnosis not present

## 2016-10-07 DIAGNOSIS — Z789 Other specified health status: Secondary | ICD-10-CM | POA: Diagnosis not present

## 2016-10-07 DIAGNOSIS — N179 Acute kidney failure, unspecified: Secondary | ICD-10-CM | POA: Diagnosis not present

## 2016-10-07 DIAGNOSIS — G9340 Encephalopathy, unspecified: Secondary | ICD-10-CM | POA: Diagnosis not present

## 2016-10-07 DIAGNOSIS — Z818 Family history of other mental and behavioral disorders: Secondary | ICD-10-CM | POA: Diagnosis not present

## 2016-10-07 DIAGNOSIS — Y92009 Unspecified place in unspecified non-institutional (private) residence as the place of occurrence of the external cause: Secondary | ICD-10-CM | POA: Diagnosis not present

## 2016-10-07 DIAGNOSIS — R2681 Unsteadiness on feet: Secondary | ICD-10-CM | POA: Diagnosis not present

## 2016-10-07 DIAGNOSIS — Z8249 Family history of ischemic heart disease and other diseases of the circulatory system: Secondary | ICD-10-CM | POA: Diagnosis not present

## 2016-10-07 DIAGNOSIS — Z79899 Other long term (current) drug therapy: Secondary | ICD-10-CM | POA: Diagnosis not present

## 2016-10-07 DIAGNOSIS — E569 Vitamin deficiency, unspecified: Secondary | ICD-10-CM | POA: Diagnosis present

## 2016-10-07 DIAGNOSIS — E869 Volume depletion, unspecified: Secondary | ICD-10-CM | POA: Diagnosis present

## 2016-10-07 DIAGNOSIS — T50902A Poisoning by unspecified drugs, medicaments and biological substances, intentional self-harm, initial encounter: Secondary | ICD-10-CM | POA: Diagnosis not present

## 2016-10-07 DIAGNOSIS — T43592A Poisoning by other antipsychotics and neuroleptics, intentional self-harm, initial encounter: Secondary | ICD-10-CM | POA: Diagnosis present

## 2016-10-07 DIAGNOSIS — T50904A Poisoning by unspecified drugs, medicaments and biological substances, undetermined, initial encounter: Secondary | ICD-10-CM | POA: Diagnosis not present

## 2016-10-07 DIAGNOSIS — F319 Bipolar disorder, unspecified: Secondary | ICD-10-CM | POA: Diagnosis present

## 2016-10-07 DIAGNOSIS — F259 Schizoaffective disorder, unspecified: Secondary | ICD-10-CM | POA: Diagnosis present

## 2016-10-07 DIAGNOSIS — N181 Chronic kidney disease, stage 1: Secondary | ICD-10-CM | POA: Diagnosis present

## 2016-10-07 LAB — CBC
HEMATOCRIT: 39 % (ref 39.0–52.0)
Hemoglobin: 12.8 g/dL — ABNORMAL LOW (ref 13.0–17.0)
MCH: 27.6 pg (ref 26.0–34.0)
MCHC: 32.8 g/dL (ref 30.0–36.0)
MCV: 84.1 fL (ref 78.0–100.0)
PLATELETS: 245 10*3/uL (ref 150–400)
RBC: 4.64 MIL/uL (ref 4.22–5.81)
RDW: 14.6 % (ref 11.5–15.5)
WBC: 8.9 10*3/uL (ref 4.0–10.5)

## 2016-10-07 LAB — COMPREHENSIVE METABOLIC PANEL
ALT: 19 U/L (ref 17–63)
AST: 26 U/L (ref 15–41)
Albumin: 4.1 g/dL (ref 3.5–5.0)
Alkaline Phosphatase: 62 U/L (ref 38–126)
Anion gap: 6 (ref 5–15)
BILIRUBIN TOTAL: 1.2 mg/dL (ref 0.3–1.2)
BUN: 16 mg/dL (ref 6–20)
CHLORIDE: 113 mmol/L — AB (ref 101–111)
CO2: 22 mmol/L (ref 22–32)
Calcium: 9.1 mg/dL (ref 8.9–10.3)
Creatinine, Ser: 1.65 mg/dL — ABNORMAL HIGH (ref 0.61–1.24)
GFR, EST AFRICAN AMERICAN: 51 mL/min — AB (ref >60)
GFR, EST NON AFRICAN AMERICAN: 44 mL/min — AB (ref >60)
Glucose, Bld: 93 mg/dL (ref 65–99)
POTASSIUM: 3.7 mmol/L (ref 3.5–5.1)
Sodium: 141 mmol/L (ref 135–145)
TOTAL PROTEIN: 7 g/dL (ref 6.5–8.1)

## 2016-10-07 LAB — PROTIME-INR
INR: 1.12
Prothrombin Time: 14.4 seconds (ref 11.4–15.2)

## 2016-10-07 LAB — CK: CK TOTAL: 373 U/L (ref 49–397)

## 2016-10-07 MED ORDER — OLANZAPINE 10 MG PO TBDP
10.0000 mg | ORAL_TABLET | Freq: Every day | ORAL | Status: DC
  Administered 2016-10-09 – 2016-10-13 (×5): 10 mg via ORAL
  Filled 2016-10-07 (×8): qty 1

## 2016-10-07 MED ORDER — HYDRALAZINE HCL 20 MG/ML IJ SOLN
10.0000 mg | Freq: Four times a day (QID) | INTRAMUSCULAR | Status: DC
  Administered 2016-10-07 – 2016-10-09 (×9): 10 mg via INTRAVENOUS
  Filled 2016-10-07 (×9): qty 1

## 2016-10-07 MED ORDER — HYDRALAZINE HCL 20 MG/ML IJ SOLN
10.0000 mg | Freq: Four times a day (QID) | INTRAMUSCULAR | Status: DC | PRN
  Administered 2016-10-07: 10 mg via INTRAVENOUS
  Filled 2016-10-07: qty 1

## 2016-10-07 MED ORDER — HYDROXYZINE HCL 50 MG/ML IM SOLN
100.0000 mg | Freq: Four times a day (QID) | INTRAMUSCULAR | Status: DC | PRN
  Filled 2016-10-07: qty 2

## 2016-10-07 MED ORDER — LORAZEPAM 2 MG/ML IJ SOLN
4.0000 mg | Freq: Once | INTRAMUSCULAR | Status: AC
  Administered 2016-10-07: 4 mg via INTRAMUSCULAR
  Filled 2016-10-07: qty 2

## 2016-10-07 MED ORDER — HYDROXYZINE HCL 50 MG/ML IM SOLN
100.0000 mg | Freq: Four times a day (QID) | INTRAMUSCULAR | Status: DC | PRN
  Administered 2016-10-07 – 2016-10-08 (×2): 100 mg via INTRAMUSCULAR
  Filled 2016-10-07 (×4): qty 2

## 2016-10-07 MED ORDER — LORAZEPAM 2 MG/ML IJ SOLN: 2.0000 mg | Freq: Once | INTRAMUSCULAR | Status: DC

## 2016-10-07 MED ORDER — LORAZEPAM 2 MG/ML IJ SOLN: 5.0000 mg | Freq: Three times a day (TID) | INTRAMUSCULAR | Status: DC | PRN

## 2016-10-07 MED ORDER — LORAZEPAM 2 MG/ML IJ SOLN
2.0000 mg | Freq: Once | INTRAMUSCULAR | Status: AC
  Administered 2016-10-07: 2 mg via INTRAVENOUS
  Filled 2016-10-07: qty 1

## 2016-10-07 MED ORDER — LORAZEPAM 2 MG/ML IJ SOLN
2.0000 mg | Freq: Once | INTRAMUSCULAR | Status: AC
  Filled 2016-10-07: qty 1

## 2016-10-07 MED ORDER — METOPROLOL TARTRATE 5 MG/5ML IV SOLN
2.5000 mg | Freq: Two times a day (BID) | INTRAVENOUS | Status: DC
  Administered 2016-10-07 – 2016-10-09 (×5): 2.5 mg via INTRAVENOUS
  Filled 2016-10-07 (×5): qty 5

## 2016-10-07 MED ORDER — LORAZEPAM 2 MG/ML IJ SOLN
4.0000 mg | Freq: Three times a day (TID) | INTRAMUSCULAR | Status: DC | PRN
  Administered 2016-10-07 – 2016-10-08 (×2): 4 mg via INTRAVENOUS
  Filled 2016-10-07 (×2): qty 2

## 2016-10-07 MED ORDER — HYDRALAZINE HCL 20 MG/ML IJ SOLN
20.0000 mg | Freq: Once | INTRAMUSCULAR | Status: AC
  Administered 2016-10-07: 20 mg via INTRAVENOUS
  Filled 2016-10-07: qty 1

## 2016-10-07 MED ORDER — ZIPRASIDONE MESYLATE 20 MG IM SOLR
20.0000 mg | Freq: Once | INTRAMUSCULAR | Status: DC
  Filled 2016-10-07: qty 20

## 2016-10-07 MED ORDER — OLANZAPINE 10 MG IM SOLR
5.0000 mg | Freq: Three times a day (TID) | INTRAMUSCULAR | Status: DC | PRN
  Administered 2016-10-07 – 2016-10-10 (×4): 5 mg via INTRAMUSCULAR
  Filled 2016-10-07 (×8): qty 10

## 2016-10-07 MED ORDER — LORAZEPAM 2 MG/ML IJ SOLN
1.0000 mg | INTRAMUSCULAR | Status: DC | PRN
  Filled 2016-10-07: qty 1

## 2016-10-07 NOTE — Progress Notes (Signed)
Pt. Became very combative, confused and belligerent towards staff, broke out of his restraints and spit on staff. On call NP made aware of the situation.

## 2016-10-07 NOTE — Progress Notes (Signed)
RN contacted by poison control, recommendation is for CK to be completed this am. Lab able to add onto blood drawn. Will update provider.

## 2016-10-07 NOTE — Plan of Care (Signed)
Problem: Safety: Goal: Ability to remain free from injury will improve Outcome: Completed/Met Date Met: 10/07/16 Bed alarm set, sitter at bedside. Restraints. discussed safety prevention plan with pt

## 2016-10-07 NOTE — Progress Notes (Signed)
Sitter is at bedside. Pt is asleep at this time, will continue to monitor.

## 2016-10-07 NOTE — Progress Notes (Addendum)
Trial run without restraints completed. Patient unable to go with medical safety restraints. Pt became verbally aggressive and began swinging fists at staff members. also experiencing a near fall. Security and medical provider contacted. Security arrived and assisted staff while restraints reapplied. Pt then spit directly into the security officers face.  Mask applied. After restraints were secured mask was removed. Pt then attempted to break out of restraints forcefully jerking hands causing restraints to tighten around wrist. Bilateral wrists are now red. Pt was encouraged against this type of behavior. Pt is not agreeable to plan of care.

## 2016-10-07 NOTE — Progress Notes (Signed)
Darryl Parsons WGY:[id number] DOB: 01-05-1956 DOA: 10/06/2016 PCP: PROVIDER NOT IN SYSTEM  Brief narrative:  61 y/o ? Visual hallucinations Bipolar/Depression, ?major Cocaine abuse HTn CKD1 Prior GIB in the past 2/2 to internal hemorrhoids Recent admit to [facility] 10.12.--10.17 Has a repeated pattern of OD and ingestion and reported self-harm  Presented to ED with after his girlfriend informed EMS that he taken 8-10 300 mg Seroquel tablets  He was confused on admit and no h/o could be taken  Past medical history-As per Problem list Chart reviewed as below-   Consultants:  psychiatry  Procedures:    Antibiotics:  none   Subjective   Alert confused Cannot really orient NUrsing reports less agitated overall   Objective    Interim History:   Telemetry:    Objective: Vitals:   10/06/16 1104 10/06/16 1250 10/06/16 2150 10/07/16 1000  BP: 117/85 (!) 161/100 (!) 178/108 (!) 159/112  Pulse: 99 100 80 78  Resp: 15 16 18    Temp:  97.5 F (36.4 C) 97.8 F (36.6 C) 97.6 F (36.4 C)  TempSrc:  Axillary Oral Axillary  SpO2: 99% 100% 100% 98%  Weight:      Height:       No intake or output data in the 24 hours ending 10/07/16 1411  Exam:  General: eomi ncat Cardiovascular: s1 s 2no m/r/g Respiratory: clear no added sound, no rales no rhionchi Abdomen: soft nt nd no rebound no gaurd Skinno le edema Neuro intact no focal power deficit, cionfused however [ativan given ealrier]  Data Reviewed: Basic Metabolic Panel:  Recent Labs Lab 10/06/16 0624 10/06/16 1936 10/07/16 0448  NA 139 141 141  K 2.9* 3.9 3.7  CL 109 111 113*  CO2 20* 24 22  GLUCOSE 133* 97 93  BUN 23* 17 16  CREATININE 1.80* 1.75* 1.65*  CALCIUM 8.9 9.0 9.1  MG  --  2.3  --   PHOS  --  1.8*  --    Liver Function Tests:  Recent Labs Lab 10/06/16 0624 10/06/16 1936 10/07/16 0448  AST 29 26 26   ALT 20 19 19   ALKPHOS 56 59 62  BILITOT 1.1 1.1 1.2  PROT 6.8 7.3 7.0    ALBUMIN 4.0 3.9 4.1   No results for input(s): LIPASE, AMYLASE in the last 168 hours. No results for input(s): AMMONIA in the last 168 hours. CBC:  Recent Labs Lab 10/06/16 0624 10/07/16 0448  WBC 8.4 8.9  HGB 12.3* 12.8*  HCT 35.6* 39.0  MCV 80.2 84.1  PLT 264 245   Cardiac Enzymes:  Recent Labs Lab 10/07/16 0448  CKTOTAL 373   BNP: Invalid input(s): POCBNP CBG: No results for input(s): GLUCAP in the last 168 hours.  No results found for this or any previous visit (from the past 240 hour(s)).   Studies:              All Imaging reviewed and is as per above notation   Scheduled Meds: . enoxaparin (LOVENOX) injection  40 mg Subcutaneous Q24H  . hydrALAZINE  10 mg Intravenous Q6H  . LORazepam  1-2 mg Intravenous Q4H  . metoprolol  2.5 mg Intravenous Q12H   Continuous Infusions:   Assessment/Plan:    Overdose On Seroquel EKG done this morning shows QTC 454 and lower than 473 admission, talks screen was positive for cocaine Tylenol every less than 10 salicylate acid less than 7 Magnesium phosphorus calcium are all within normal limits Unclear if any content of overdose  for suicidality or not patient is not forthcoming and is confused  I d/w emergency contct--He said to his GF that "nobody was paying him attention" and then he in front of the wife he took the pills  He didn't say he wanted to kill himself per wife.  He had not been drinking nor doing anything else Defer further decision making 2 psychiatrist as patient has recently been admitted to inpatient psychiatric facility for overdose and discharged on 08/31/2016 Cont ativan 1-2 and change to prn Continue to hold Hydroxyyzine 25, Sertraline 50    Verneita Griffes, MD  [facility] Pager [phone] 10/07/2016, 2:11 PM    LOS: 0 days

## 2016-10-08 DIAGNOSIS — F17200 Nicotine dependence, unspecified, uncomplicated: Secondary | ICD-10-CM

## 2016-10-08 DIAGNOSIS — G9340 Encephalopathy, unspecified: Secondary | ICD-10-CM

## 2016-10-08 DIAGNOSIS — Z888 Allergy status to other drugs, medicaments and biological substances status: Secondary | ICD-10-CM

## 2016-10-08 DIAGNOSIS — T50902A Poisoning by unspecified drugs, medicaments and biological substances, intentional self-harm, initial encounter: Secondary | ICD-10-CM

## 2016-10-08 DIAGNOSIS — Z8249 Family history of ischemic heart disease and other diseases of the circulatory system: Secondary | ICD-10-CM

## 2016-10-08 DIAGNOSIS — Z789 Other specified health status: Secondary | ICD-10-CM

## 2016-10-08 LAB — TSH: TSH: 0.287 u[IU]/mL — ABNORMAL LOW (ref 0.350–4.500)

## 2016-10-08 LAB — T4, FREE: Free T4: 0.69 ng/dL (ref 0.61–1.12)

## 2016-10-08 LAB — VITAMIN B12: VITAMIN B 12: 200 pg/mL (ref 180–914)

## 2016-10-08 LAB — MRSA PCR SCREENING: MRSA by PCR: POSITIVE — AB

## 2016-10-08 MED ORDER — HALOPERIDOL LACTATE 5 MG/ML IJ SOLN
INTRAMUSCULAR | Status: AC
  Filled 2016-10-08: qty 2

## 2016-10-08 MED ORDER — MUPIROCIN 2 % EX OINT
1.0000 "application " | TOPICAL_OINTMENT | Freq: Two times a day (BID) | CUTANEOUS | Status: AC
  Administered 2016-10-08 – 2016-10-12 (×10): 1 via NASAL
  Filled 2016-10-08: qty 22

## 2016-10-08 MED ORDER — HYDRALAZINE HCL 20 MG/ML IJ SOLN
20.0000 mg | Freq: Once | INTRAMUSCULAR | Status: AC
  Administered 2016-10-08: 20 mg via INTRAVENOUS
  Filled 2016-10-08: qty 1

## 2016-10-08 MED ORDER — LORAZEPAM 2 MG/ML IJ SOLN
4.0000 mg | Freq: Four times a day (QID) | INTRAMUSCULAR | Status: DC | PRN
  Administered 2016-10-09 – 2016-10-13 (×6): 4 mg via INTRAVENOUS
  Filled 2016-10-08 (×9): qty 2

## 2016-10-08 MED ORDER — DEXMEDETOMIDINE HCL IN NACL 200 MCG/50ML IV SOLN
0.4000 ug/kg/h | INTRAVENOUS | Status: DC
  Administered 2016-10-08: 0.6 ug/kg/h via INTRAVENOUS
  Administered 2016-10-08: 1.2 ug/kg/h via INTRAVENOUS
  Administered 2016-10-08: 0.6 ug/kg/h via INTRAVENOUS
  Administered 2016-10-08: 1.2 ug/kg/h via INTRAVENOUS
  Administered 2016-10-08: 0.5 ug/kg/h via INTRAVENOUS
  Administered 2016-10-08: 0.7 ug/kg/h via INTRAVENOUS
  Administered 2016-10-08: 1.2 ug/kg/h via INTRAVENOUS
  Administered 2016-10-09: 1.1 ug/kg/h via INTRAVENOUS
  Administered 2016-10-09: 1.2 ug/kg/h via INTRAVENOUS
  Administered 2016-10-09 (×2): 1.1 ug/kg/h via INTRAVENOUS
  Administered 2016-10-09: 1.2 ug/kg/h via INTRAVENOUS
  Administered 2016-10-09: 1.1 ug/kg/h via INTRAVENOUS
  Administered 2016-10-09 (×2): 0.9 ug/kg/h via INTRAVENOUS
  Administered 2016-10-09 – 2016-10-11 (×14): 1.2 ug/kg/h via INTRAVENOUS
  Administered 2016-10-11: 0.8 ug/kg/h via INTRAVENOUS
  Administered 2016-10-11 (×3): 1 ug/kg/h via INTRAVENOUS
  Administered 2016-10-11: 0.9 ug/kg/h via INTRAVENOUS
  Administered 2016-10-11: 1.2 ug/kg/h via INTRAVENOUS
  Administered 2016-10-11: 1.1 ug/kg/h via INTRAVENOUS
  Administered 2016-10-11: 1 ug/kg/h via INTRAVENOUS
  Administered 2016-10-12 (×2): 0.9 ug/kg/h via INTRAVENOUS
  Filled 2016-10-08 (×6): qty 50
  Filled 2016-10-08: qty 200
  Filled 2016-10-08: qty 50
  Filled 2016-10-08: qty 100
  Filled 2016-10-08 (×3): qty 50
  Filled 2016-10-08: qty 250
  Filled 2016-10-08: qty 100
  Filled 2016-10-08: qty 50
  Filled 2016-10-08: qty 100
  Filled 2016-10-08 (×3): qty 50
  Filled 2016-10-08: qty 100
  Filled 2016-10-08 (×8): qty 50

## 2016-10-08 MED ORDER — LORAZEPAM 2 MG/ML IJ SOLN
4.0000 mg | Freq: Once | INTRAMUSCULAR | Status: AC
  Administered 2016-10-08: 4 mg via INTRAMUSCULAR
  Filled 2016-10-08: qty 2

## 2016-10-08 MED ORDER — NICOTINE 7 MG/24HR TD PT24
7.0000 mg | MEDICATED_PATCH | Freq: Every day | TRANSDERMAL | Status: DC
  Administered 2016-10-08 – 2016-10-13 (×4): 7 mg via TRANSDERMAL
  Filled 2016-10-08 (×7): qty 1

## 2016-10-08 MED ORDER — THIAMINE HCL 100 MG/ML IJ SOLN
500.0000 mg | Freq: Three times a day (TID) | INTRAVENOUS | Status: AC
  Administered 2016-10-08 – 2016-10-11 (×8): 500 mg via INTRAVENOUS
  Filled 2016-10-08 (×9): qty 5

## 2016-10-08 MED ORDER — HALOPERIDOL LACTATE 5 MG/ML IJ SOLN
10.0000 mg | Freq: Once | INTRAMUSCULAR | Status: AC
  Administered 2016-10-08: 10 mg via INTRAVENOUS

## 2016-10-08 MED ORDER — CHLORHEXIDINE GLUCONATE CLOTH 2 % EX PADS
6.0000 | MEDICATED_PAD | Freq: Every day | CUTANEOUS | Status: AC
  Administered 2016-10-08 – 2016-10-12 (×5): 6 via TOPICAL

## 2016-10-08 NOTE — Progress Notes (Signed)
Pt. Began to wake up and was very agitated and being verbally and physically aggressive. MD made aware.

## 2016-10-08 NOTE — Progress Notes (Signed)
Pt blood pressure remains elevated with systolic in the 601V-615P throughout the night despite hydralazine administration. [facility] notified and no new orders received. Will continue to monitor.
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
## 2016-10-08 NOTE — Progress Notes (Signed)
Pt increasingly agitated. Gave Ativan 4mg  IM at  2324. Patient continues to be agitated and verbally aggressive. PRN Ativan 4mg  IV available to use. Paged [facility] to confirm okay to administer. Received approval for administration. PRN ativan administered and will continue to monitor.

## 2016-10-08 NOTE — Progress Notes (Signed)
[facility] Progress Note Patient Name: Darryl Parsons DOB: January 28, 1956 MRN: [id number]   Date of Service  10/08/2016  HPI/Events of Note  Severe agitation requiring security, restraints.  HD stable.  QTc on EKG less than 500.  eICU Interventions  Plan: 10 mg Haldol IV now. Nurse to call if patient remains agitated within 30 minutes     Intervention Category Major Interventions: Delirium, psychosis, severe agitation - evaluation and management  Altus Zaino 10/08/2016, 2:47 AM

## 2016-10-08 NOTE — Consult Note (Signed)
[facility] Psychiatry Consult   Reason for Consult:  Schizophrenia, intentional overdose Referring Physician:  Dr. Verlon Au Patient Identification: Darryl Parsons MRN:  [id number] Principal Diagnosis: Overdose Diagnosis:   Patient Active Problem List   Diagnosis Date Noted  . Overdose [T50.901A] 10/06/2016  . Schizoaffective disorder, depressive type ([place]) [F25.1] 08/27/2016  . Cocaine use disorder, mild, abuse [F14.10] 08/27/2016  . Essential hypertension [I10] 08/27/2016  . Chronic headache [R51] 08/27/2016  . Suicidal ideation [R45.851] 02/03/2016  . Acute encephalopathy [G93.40] 01/30/2016  . CKD (chronic kidney disease) stage 4, GFR 15-29 ml/min (HCC) [N18.4] 01/30/2016  . Benign essential HTN [I10] 01/30/2016  . Cocaine abuse [F14.10] 08/21/2012  . Hypokalemia [E87.6] 08/21/2012    Total Time spent with patient: 45 minutes  Subjective:   [name] is a 60 y.o. male patient admitted with intentional drug overdose  HPI: Darryl Parsons is a 60 years old male with a history of schizophrenia versus schizoaffective disorder, polysubstance abuse admitted to [facility] as a status post intentional drug overdose as a suicide attempt. Patient seen, chart reviewed for the face-to-face psychiatric consultation and evaluation. Patient is a poor historian reportedly he overdosed on his medication Seroquel 300 mg taken 10 of them and required 4 point restraints because of increased agitation. Reportedly patient is married and unemployed, lives with his wife in College. Patient has a previous suicidal attempt required acute psychiatric hospitalization from October 12 to 08/31/2016.    Past Psychiatric History: Schizoaffective disorder and polysubstance abuse including cocaine abuse  Interval history: Patient seen today with LCSW for psychiatric consultation follow-up. Patient was not able to provide any history as he has been restrained physically and chemically  secondary to aggressive behavior throughout last night as per the staff reports. Patient received 2 doses of Zyprexa 5 mg each and also Haldol 10 mg intramuscular overnight to control his aggressive behavior. Patient is also transferred to critical care medicine and transferred to the ICU. Patient does not appear to be in distress during this visit.  Risk to Self: Is patient at risk for suicide?: No Risk to Others:   Prior Inpatient Therapy:   Prior Outpatient Therapy:    Past Medical History:  Past Medical History:  Diagnosis Date  . Chronic kidney disease   . Headache   . Hypertension   . Low back pain   . Mental disorder    History reviewed. No pertinent surgical history. Family History:  Family History  Problem Relation Age of Onset  . Cardiomyopathy Sister   . Lupus Sister   . Kidney failure Sister   . Factor VIII deficiency Neg Hx   . Mental illness Neg Hx    Family Psychiatric  History: Noncontributory Social History:  History  Alcohol Use  . Yes    Comment: 1-2 beers a month     History  Drug Use  . Types: Cocaine    Social History   Social History  . Marital status: Single    Spouse name: N/A  . Number of children: N/A  . Years of education: N/A   Social History Main Topics  . Smoking status: Current Every Day Smoker    Packs/day: 0.50    Types: Cigarettes  . Smokeless tobacco: Never Used  . Alcohol use Yes     Comment: 1-2 beers a month  . Drug use:     Types: Cocaine  . Sexual activity: No   Other Topics Concern  . None  Social History Narrative  . None   Additional Social History:    Allergies:   Allergies  Allergen Reactions  . Other     Pt states he is allergic to a blood pressure pill. He is unable to state reaction. He is spelling it adelatt and aderlatt.    Labs:  Results for orders placed or performed during the hospital encounter of 10/06/16 (from the past 48 hour(s))  Acetaminophen level     Status: Abnormal   Collection  Time: 10/06/16  7:36 PM  Result Value Ref Range   Acetaminophen (Tylenol), Serum <10 (L) 10 - 30 ug/mL    Comment:        THERAPEUTIC CONCENTRATIONS VARY SIGNIFICANTLY. A RANGE OF 10-30 ug/mL MAY BE AN EFFECTIVE CONCENTRATION FOR MANY PATIENTS. HOWEVER, SOME ARE BEST TREATED AT CONCENTRATIONS OUTSIDE THIS RANGE. ACETAMINOPHEN CONCENTRATIONS >150 ug/mL AT 4 HOURS AFTER INGESTION AND >50 ug/mL AT 12 HOURS AFTER INGESTION ARE OFTEN ASSOCIATED WITH TOXIC REACTIONS.   Salicylate level     Status: None   Collection Time: 10/06/16  7:36 PM  Result Value Ref Range   Salicylate Lvl <2.6 2.8 - 30.0 mg/dL  Magnesium     Status: None   Collection Time: 10/06/16  7:36 PM  Result Value Ref Range   Magnesium 2.3 1.7 - 2.4 mg/dL  Phosphorus     Status: Abnormal   Collection Time: 10/06/16  7:36 PM  Result Value Ref Range   Phosphorus 1.8 (L) 2.5 - 4.6 mg/dL  Comprehensive metabolic panel     Status: Abnormal   Collection Time: 10/06/16  7:36 PM  Result Value Ref Range   Sodium 141 135 - 145 mmol/L   Potassium 3.9 3.5 - 5.1 mmol/L    Comment: NO VISIBLE HEMOLYSIS DELTA CHECK NOTED    Chloride 111 101 - 111 mmol/L   CO2 24 22 - 32 mmol/L   Glucose, Bld 97 65 - 99 mg/dL   BUN 17 6 - 20 mg/dL   Creatinine, Ser 1.75 (H) 0.61 - 1.24 mg/dL   Calcium 9.0 8.9 - 10.3 mg/dL   Total Protein 7.3 6.5 - 8.1 g/dL   Albumin 3.9 3.5 - 5.0 g/dL   AST 26 15 - 41 U/L   ALT 19 17 - 63 U/L   Alkaline Phosphatase 59 38 - 126 U/L   Total Bilirubin 1.1 0.3 - 1.2 mg/dL   GFR calc non Af Amer 41 (L) >60 mL/min   GFR calc Af Amer 47 (L) >60 mL/min    Comment: (NOTE) The eGFR has been calculated using the CKD EPI equation. This calculation has not been validated in all clinical situations. eGFR's persistently <60 mL/min signify possible Chronic Kidney Disease.    Anion gap 6 5 - 15  Comprehensive metabolic panel     Status: Abnormal   Collection Time: 10/07/16  4:48 AM  Result Value Ref Range    Sodium 141 135 - 145 mmol/L   Potassium 3.7 3.5 - 5.1 mmol/L   Chloride 113 (H) 101 - 111 mmol/L   CO2 22 22 - 32 mmol/L   Glucose, Bld 93 65 - 99 mg/dL   BUN 16 6 - 20 mg/dL   Creatinine, Ser 1.65 (H) 0.61 - 1.24 mg/dL   Calcium 9.1 8.9 - 10.3 mg/dL   Total Protein 7.0 6.5 - 8.1 g/dL   Albumin 4.1 3.5 - 5.0 g/dL   AST 26 15 - 41 U/L   ALT 19 17 - 63 U/L  Alkaline Phosphatase 62 38 - 126 U/L   Total Bilirubin 1.2 0.3 - 1.2 mg/dL   GFR calc non Af Amer 44 (L) >60 mL/min   GFR calc Af Amer 51 (L) >60 mL/min    Comment: (NOTE) The eGFR has been calculated using the CKD EPI equation. This calculation has not been validated in all clinical situations. eGFR's persistently <60 mL/min signify possible Chronic Kidney Disease.    Anion gap 6 5 - 15  CBC     Status: Abnormal   Collection Time: 10/07/16  4:48 AM  Result Value Ref Range   WBC 8.9 4.0 - 10.5 K/uL   RBC 4.64 4.22 - 5.81 MIL/uL   Hemoglobin 12.8 (L) 13.0 - 17.0 g/dL   HCT 39.0 39.0 - 52.0 %   MCV 84.1 78.0 - 100.0 fL   MCH 27.6 26.0 - 34.0 pg   MCHC 32.8 30.0 - 36.0 g/dL   RDW 14.6 11.5 - 15.5 %   Platelets 245 150 - 400 K/uL  Protime-INR     Status: None   Collection Time: 10/07/16  4:48 AM  Result Value Ref Range   Prothrombin Time 14.4 11.4 - 15.2 seconds   INR 1.12   CK     Status: None   Collection Time: 10/07/16  4:48 AM  Result Value Ref Range   Total CK 373 49 - 397 U/L  MRSA PCR Screening     Status: Abnormal   Collection Time: 10/07/16  8:07 PM  Result Value Ref Range   MRSA by PCR POSITIVE (A) NEGATIVE    Comment:        The GeneXpert MRSA Assay (FDA approved for NASAL specimens only), is one component of a comprehensive MRSA colonization surveillance program. It is not intended to diagnose MRSA infection nor to guide or monitor treatment for MRSA infections. RESULT CALLED TO, READ BACK BY AND VERIFIED WITH: Denyse Amass RN @ [phone] ON 10/08/16 BY C DAVIS   TSH     Status: Abnormal   Collection  Time: 10/08/16 10:20 AM  Result Value Ref Range   TSH 0.287 (L) 0.350 - 4.500 uIU/mL    Comment: Performed by a 3rd Generation assay with a functional sensitivity of <=0.01 uIU/mL.  T4, free     Status: None   Collection Time: 10/08/16 10:20 AM  Result Value Ref Range   Free T4 0.69 0.61 - 1.12 ng/dL    Comment: (NOTE) Biotin ingestion may interfere with free T4 tests. If the results are inconsistent with the TSH level, previous test results, or the clinical presentation, then consider biotin interference. If needed, order repeat testing after stopping biotin. Performed at [facility]   Vitamin B12     Status: None   Collection Time: 10/08/16 10:20 AM  Result Value Ref Range   Vitamin B-12 200 180 - 914 pg/mL    Comment: (NOTE) This assay is not validated for testing neonatal or myeloproliferative syndrome specimens for Vitamin B12 levels. Performed at [facility]     Current Facility-Administered Medications  Medication Dose Route Frequency Provider Last Rate Last Dose  . Chlorhexidine Gluconate Cloth 2 % PADS 6 each  6 each Topical Q0600 Nita Sells, MD      . dexmedetomidine (PRECEDEX) 200 MCG/50ML (4 mcg/mL) infusion  0.4-1.2 mcg/kg/hr Intravenous Titrated Javier Glazier, MD 11 mL/hr at 10/08/16 1550 0.5 mcg/kg/hr at 10/08/16 1550  . enoxaparin (LOVENOX) injection 40 mg  40 mg Subcutaneous Q24H Nita Sells, MD  40 mg at 10/07/16 2119  . hydrALAZINE (APRESOLINE) injection 10 mg  10 mg Intravenous Q6H Barton Dubois, MD   10 mg at 10/08/16 1020  . hydrOXYzine (VISTARIL) injection 100 mg  100 mg Intramuscular Q6H PRN Gardiner Barefoot, NP   100 mg at 10/08/16 0333  . LORazepam (ATIVAN) injection 4 mg  4 mg Intravenous Q6H PRN Gardiner Barefoot, NP      . metoprolol (LOPRESSOR) injection 2.5 mg  2.5 mg Intravenous Q12H Barton Dubois, MD   2.5 mg at 10/08/16 1022  . mupirocin ointment (BACTROBAN) 2 % 1 application  1 application Nasal BID  Nita Sells, MD   1 application at 40/10/27 1021  . nicotine (NICODERM CQ - dosed in mg/24 hr) patch 7 mg  7 mg Transdermal Daily Javier Glazier, MD   7 mg at 10/08/16 1330  . OLANZapine (ZYPREXA) injection 5 mg  5 mg Intramuscular Q8H PRN Nita Sells, MD   5 mg at 10/08/16 1040  . OLANZapine zydis (ZYPREXA) disintegrating tablet 10 mg  10 mg Oral Daily Nita Sells, MD      . thiamine 560m in normal saline (560m IVPB  500 mg Intravenous Q8H JeJavier GlazierMD        Musculoskeletal: Strength & Muscle Tone: decreased Gait & Station: unable to stand Patient leans: N/A  Psychiatric Specialty Exam: Physical Exam  as per history and physical   ROS  unable to obtain secondary to poor historian/altered mental status   Blood pressure 111/75, pulse 82, temperature 98.4 F (36.9 C), temperature source Oral, resp. rate 14, height _0  (1.778 m), weight 87.6 kg (193 lb 2 oz), SpO2 100 %.Body mass index is 27.71 kg/m.  General Appearance: Guarded  Eye Contact:  Fair  Speech:  Slow and Slurred  Volume:  Decreased  Mood:  Anxious and Depressed  Affect:  Constricted and Depressed  Thought Process:  Coherent  Orientation:  Full (Time, Place, and Person)  Thought Content:  Paranoid Ideation and Rumination  Suicidal Thoughts:  Yes.  with intent/plan  Homicidal Thoughts:  No  Memory:  Immediate;   Fair Recent;   Fair Remote;   Poor  Judgement:  Impaired  Insight:  Shallow  Psychomotor Activity:  Decreased  Concentration:  Concentration: Fair and Attention Span: Poor  Recall:  Poor  Fund of Knowledge:  Poor  Language:  Fair  Akathisia:  Negative  Handed:  Right  AIMS (if indicated):     Assets:  FiCatering managerousing Intimacy Leisure Time Resilience Social Support  ADL's:  Impaired  Cognition:  Impaired,  Mild  Sleep:        Treatment Plan Summary: [age]ears old male with schizoaffective disorder, polysubstance abuse admitted for  status post intentional drug overdose and has recurrent episodes which required acute psychiatric hospitalization in the past. Patient is currently restrained both upper extremities and lower extremities secondary to agitation, patient meet criteria for acute psychiatric hospitalization when medically stable.  Patient got worse with his psychosis, agitation and aggressive behavior overnight required both chemical and physical restraints and also transferring from regular unit to the intensive care unit under the care of critical care services.  Continue safety sitter Continue Zyprexa 5 mg IM every 8 hours as needed for agitation and aggressive behaviors Recommend to provide benztropine 1 mg every 8 hours as needed for extrapyramidal symptoms. Monitor EKG for acute is a prolongation Daily contact with patient to assess and evaluate symptoms and progress in  treatment and Medication management  Appreciate psychiatric consultation and follow up as clinically required Please contact [phone] or [phone] if needs further assistance  Disposition: Recommend psychiatric Inpatient admission when medically cleared. Supportive therapy provided about ongoing stressors.  Ambrose Finland, MD 10/08/2016 4:25 PM

## 2016-10-08 NOTE — Progress Notes (Signed)
[facility] Progress Note Patient Name: Darryl Parsons DOB: 09-16-1956 MRN: [id number]   Date of Service  10/08/2016  HPI/Events of Note  On percedex and sitter at bedside. Currently cal on camera but rn says needs restraint due to unpredictably gets agitated  eICU Interventions  restraints     Intervention Category Major Interventions: Delirium, psychosis, severe agitation - evaluation and management  Ariona Deschene 10/08/2016, 6:48 PM

## 2016-10-08 NOTE — Progress Notes (Signed)
Pt. Placed in restraints per order. Pt. Has noted skin circumferential bilateral wrist abrasion and redness from prior restraint and was previously noted. Restraints applied correctly and documented.

## 2016-10-08 NOTE — Consult Note (Signed)
PULMONARY / CRITICAL CARE MEDICINE   Name: Darryl Parsons MRN: [id number] DOB: September 13, 1956    ADMISSION DATE:  10/06/2016 CONSULTATION DATE:  10/06/2016  REFERRING MD:  Verneita Griffes, M.D. / [facility]  CHIEF COMPLAINT:  Acute Encephalopathy  HISTORY OF PRESENT ILLNESS:  60 y.o. male with Seroquel overdose. History obtained from the patient's hospitalist as well as the electronic medical record given altered mental status. Patient has had visual hallucinations with a known history of cocaine use and bipolar disorder/depression. Patient presented to the emergency department with his girlfriend informing the EMS staff that he had taken 8-10 pills of 300 mg Seroquel. Patient was confused on admission. Psychiatry was subsequently consulted. They documented the patient with a schizoaffective disorder and polysubstance abuse. Patient has known history of recurrent intentional drug overdoses in the past requiring hospitalization. Psychiatrist agreed with discontinuing Seroquel and starting IM Zyprexa as needed for agitation and aggressive behavior. The plan is to transition patient to a psychiatric hospital once medically stable. She did receive 10 mg of IV Haldol earlier this morning as well as intermittent Ativan since admission. He has received 2 doses of 5 mg of IM Zyprexa overnight for agitation. Oral Zyprexa has been ordered but not given due to altered mental status.  PAST MEDICAL HISTORY :  Past Medical History:  Diagnosis Date  . Chronic kidney disease   . Headache   . Hypertension   . Low back pain   . Mental disorder     PAST SURGICAL HISTORY: Unable to obtain surgical history given altered mental status.  Allergies  Allergen Reactions  . Other     Pt states he is allergic to a blood pressure pill. He is unable to state reaction. He is spelling it adelatt and aderlatt.    No current facility-administered medications on file prior to encounter.    Current Outpatient Prescriptions on  File Prior to Encounter  Medication Sig  . amLODipine (NORVASC) 10 MG tablet Take 1 tablet (10 mg total) by mouth daily.  . benazepril (LOTENSIN) 10 MG tablet Take 1 tablet (10 mg total) by mouth daily.  . hydrochlorothiazide (MICROZIDE) 12.5 MG capsule Take 1 capsule (12.5 mg total) by mouth daily.  . hydrOXYzine (ATARAX/VISTARIL) 25 MG tablet Take 1 tablet (25 mg total) by mouth every 6 (six) hours as needed for anxiety (or CIWA score </= 10).  . methocarbamol (ROBAXIN) 500 MG tablet Take 1 tablet (500 mg total) by mouth 2 (two) times daily. (Patient not taking: Reported on 08/26/2016)  . QUEtiapine (SEROQUEL) 300 MG tablet Take 1 tablet (300 mg total) by mouth at bedtime.  . sertraline (ZOLOFT) 50 MG tablet Take 1 tablet (50 mg total) by mouth daily.  [name] thiamine 100 MG tablet Take 1 tablet (100 mg total) by mouth daily.    FAMILY HISTORY:  Family History  Problem Relation Age of Onset  . Cardiomyopathy Sister   . Lupus Sister   . Kidney failure Sister   . Factor VIII deficiency Neg Hx   . Mental illness Neg Hx     SOCIAL HISTORY: Social History   Social History  . Marital status: Single    Spouse name: N/A  . Number of children: N/A  . Years of education: N/A   Social History Main Topics  . Smoking status: Current Every Day Smoker    Packs/day: 0.50    Types: Cigarettes  . Smokeless tobacco: Never Used  . Alcohol use Yes     Comment: 1-2 beers  a month  . Drug use:     Types: Cocaine  . Sexual activity: No   Other Topics Concern  . None   Social History Narrative  . None    REVIEW OF SYSTEMS:  Unable to obtain with altered mental status.  SUBJECTIVE: As above.  VITAL SIGNS: BP (!) 177/102 (BP Location: Right Arm)   Pulse (!) 116   Temp 98.4 F (36.9 C) (Oral)   Resp 20   Ht 5\' 10"  (1.778 m)   Wt 193 lb 2 oz (87.6 kg)   SpO2 100%   BMI 27.71 kg/m   HEMODYNAMICS:    VENTILATOR SETTINGS:    INTAKE / OUTPUT: I/O last 3 completed shifts: In: 680  [P.O.:680] Out: 1140 [Urine:1140]  PHYSICAL EXAMINATION: General:  Eyes closed. Sitter at bedside. Lights off. No family at bedside.  Integument:  Warm & dry. No rash on exposed skin.  Lymphatics:  No appreciated cervical or supraclavicular lymphadenoapthy. HEENT:  Moist mucus membranes. No scleral injection or icterus.  Cardiovascular:  Regular rate. No edema. No appreciable JVD.  Pulmonary:  Good aeration & clear to auscultation bilaterally. Symmetric chest wall expansion. No accessory muscle use on room air. Abdomen: Soft. Normal bowel sounds. Nondistended.  Musculoskeletal:  Normal bulk and tone. No joint deformity or effusion appreciated. Currently in 4 point restraints.  Neurological:  CN 2-12 grossly in tact. No meningismus. Moving all 4 extremities equally.  Psychiatric:  Patient remains altered and will not answer questions. Noncooperative.  LABS:  BMET  Recent Labs Lab 10/06/16 0624 10/06/16 1936 10/07/16 0448  NA 139 141 141  K 2.9* 3.9 3.7  CL 109 111 113*  CO2 20* 24 22  BUN 23* 17 16  CREATININE 1.80* 1.75* 1.65*  GLUCOSE 133* 97 93    Electrolytes  Recent Labs Lab 10/06/16 0624 10/06/16 1936 10/07/16 0448  CALCIUM 8.9 9.0 9.1  MG  --  2.3  --   PHOS  --  1.8*  --     CBC  Recent Labs Lab 10/06/16 0624 10/07/16 0448  WBC 8.4 8.9  HGB 12.3* 12.8*  HCT 35.6* 39.0  PLT 264 245    Coag's  Recent Labs Lab 10/07/16 0448  INR 1.12    Sepsis Markers No results for input(s): LATICACIDVEN, PROCALCITON, O2SATVEN in the last 168 hours.  ABG No results for input(s): PHART, PCO2ART, PO2ART in the last 168 hours.  Liver Enzymes  Recent Labs Lab 10/06/16 0624 10/06/16 1936 10/07/16 0448  AST 29 26 26   ALT 20 19 19   ALKPHOS 56 59 62  BILITOT 1.1 1.1 1.2  ALBUMIN 4.0 3.9 4.1    Cardiac Enzymes No results for input(s): TROPONINI, PROBNP in the last 168 hours.  Glucose No results for input(s): GLUCAP in the last 168  hours.  Imaging No results found.   STUDIES:  EKG 11/23:  Personally reviewed by me. Left axis deviation. Normal sinus rhythm. QTc 456ms.   MICROBIOLOGY: MRSA PCR 11/23:  Positive  ANTIBIOTICS: None.  SIGNIFICANT EVENTS: 11/22 - Admit 11/24 - PCCM consulted for Precedex with ongoing delirium/agitation  LINES/TUBES: PIV  ASSESSMENT / PLAN:  60 y.o. male with known history of cocaine use as well as tobacco and alcohol. Patient with intentional overdose of Seroquel and ongoing agitation and altered mental status. It's unclear exactly how much alcohol the patient may be using on a chronic basis. This could certainly represent a withdrawal syndrome from alcohol. I am checking other lab work to determine  possible additional etiologies. Precedex is certainly a reasonable step to treatment of the patient's agitation and ongoing delirium/encephalopathy.  1. Intentional overdose/suicide attempt: Patient planned for inpatient hospitalization at psychiatric Hospital once medically stable. Evaluated by psychiatry. 2. Acute encephalopathy/delirium: Starting Precedex infusion. Checking serum TSH, free T4, and B12 levels. Continuing IV hydroxyzine, Ativan, and Zyprexa as needed. 3. Alcohol use: Starting thiamine 500 mg IV every 8 hours 3 days. 4. Tobacco use disorder: Starting nicotine patch 7 mg/24.  Remainder of care as per primary service.  Sonia Baller Ashok Cordia, M.D. [facility] Pulmonary & Critical Care Pager:  [phone] After 3pm or if no response, call [phone] 10/08/2016, 9:05 AM

## 2016-10-09 DIAGNOSIS — E569 Vitamin deficiency, unspecified: Secondary | ICD-10-CM

## 2016-10-09 DIAGNOSIS — Z720 Tobacco use: Secondary | ICD-10-CM

## 2016-10-09 DIAGNOSIS — Z87891 Personal history of nicotine dependence: Secondary | ICD-10-CM

## 2016-10-09 LAB — CBC
HCT: 42.2 % (ref 39.0–52.0)
HEMOGLOBIN: 13.9 g/dL (ref 13.0–17.0)
MCH: 28 pg (ref 26.0–34.0)
MCHC: 32.9 g/dL (ref 30.0–36.0)
MCV: 84.9 fL (ref 78.0–100.0)
PLATELETS: 219 10*3/uL (ref 150–400)
RBC: 4.97 MIL/uL (ref 4.22–5.81)
RDW: 14.7 % (ref 11.5–15.5)
WBC: 8 10*3/uL (ref 4.0–10.5)

## 2016-10-09 LAB — COMPREHENSIVE METABOLIC PANEL
ALBUMIN: 3.8 g/dL (ref 3.5–5.0)
ALT: 19 U/L (ref 17–63)
AST: 37 U/L (ref 15–41)
Alkaline Phosphatase: 63 U/L (ref 38–126)
Anion gap: 8 (ref 5–15)
BUN: 33 mg/dL — ABNORMAL HIGH (ref 6–20)
CHLORIDE: 115 mmol/L — AB (ref 101–111)
CO2: 19 mmol/L — AB (ref 22–32)
CREATININE: 1.94 mg/dL — AB (ref 0.61–1.24)
Calcium: 9.1 mg/dL (ref 8.9–10.3)
GFR calc non Af Amer: 36 mL/min — ABNORMAL LOW (ref >60)
GFR, EST AFRICAN AMERICAN: 42 mL/min — AB (ref >60)
GLUCOSE: 100 mg/dL — AB (ref 65–99)
Potassium: 3.9 mmol/L (ref 3.5–5.1)
SODIUM: 142 mmol/L (ref 135–145)
Total Bilirubin: 1.3 mg/dL — ABNORMAL HIGH (ref 0.3–1.2)
Total Protein: 7.1 g/dL (ref 6.5–8.1)

## 2016-10-09 MED ORDER — AMLODIPINE BESYLATE 10 MG PO TABS
10.0000 mg | ORAL_TABLET | Freq: Every day | ORAL | Status: DC
  Administered 2016-10-09 – 2016-10-13 (×5): 10 mg via ORAL
  Filled 2016-10-09 (×5): qty 1

## 2016-10-09 MED ORDER — SODIUM CHLORIDE 0.9 % IV SOLN
INTRAVENOUS | Status: DC
  Administered 2016-10-09: 500 mL via INTRAVENOUS
  Administered 2016-10-09: 1000 mL via INTRAVENOUS
  Administered 2016-10-10 – 2016-10-11 (×2): via INTRAVENOUS

## 2016-10-09 MED ORDER — HYDRALAZINE HCL 20 MG/ML IJ SOLN: 10.0000 mg | Freq: Four times a day (QID) | INTRAMUSCULAR | Status: DC

## 2016-10-09 MED ORDER — HYDRALAZINE HCL 20 MG/ML IJ SOLN
10.0000 mg | Freq: Four times a day (QID) | INTRAMUSCULAR | Status: DC | PRN
  Administered 2016-10-09 – 2016-10-12 (×3): 40 mg via INTRAVENOUS
  Filled 2016-10-09: qty 1
  Filled 2016-10-09 (×4): qty 2

## 2016-10-09 MED ORDER — CYANOCOBALAMIN 1000 MCG/ML IJ SOLN
1000.0000 ug | Freq: Once | INTRAMUSCULAR | Status: AC
  Administered 2016-10-09: 1000 ug via INTRAMUSCULAR
  Filled 2016-10-09: qty 1

## 2016-10-09 MED ORDER — CLONIDINE HCL 0.2 MG/24HR TD PTWK
0.2000 mg | MEDICATED_PATCH | TRANSDERMAL | Status: DC
  Administered 2016-10-09 – 2016-10-12 (×2): 0.2 mg via TRANSDERMAL
  Filled 2016-10-09 (×2): qty 1

## 2016-10-09 MED ORDER — METOPROLOL TARTRATE 5 MG/5ML IV SOLN
5.0000 mg | Freq: Four times a day (QID) | INTRAVENOUS | Status: DC
  Administered 2016-10-09 – 2016-10-13 (×14): 5 mg via INTRAVENOUS
  Filled 2016-10-09 (×14): qty 5

## 2016-10-09 NOTE — Progress Notes (Signed)
[facility] Progress Note Patient Name: Darryl Parsons DOB: 04/25/1956 MRN: [id number]   Date of Service  10/09/2016  HPI/Events of Note  Hypertensive Agitated - precedex being titrated  eICU Interventions  Increase hydrallazine, metoprolol prn recume home amlodipin     Intervention Category Intermediate Interventions: Hypertension - evaluation and management  Darryl Parsons V. 10/09/2016, 7:19 PM
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
## 2016-10-09 NOTE — Progress Notes (Signed)
PULMONARY / CRITICAL CARE MEDICINE   Name: Darryl Parsons MRN: [id number] DOB: 1956-04-19    ADMISSION DATE:  10/06/2016 CONSULTATION DATE:  10/06/2016  REFERRING MD:  Verneita Griffes, M.D. / [facility]  CHIEF COMPLAINT:  Acute Encephalopathy  HISTORY OF PRESENT ILLNESS:  60 y.o. male with Seroquel overdose. History obtained from the patient's hospitalist as well as the electronic medical record given altered mental status. Patient has had visual hallucinations with a known history of cocaine use and bipolar disorder/depression. Patient presented to the emergency department with his girlfriend informing the EMS staff that he had taken 8-10 pills of 300 mg Seroquel. Patient was confused on admission. Psychiatry was subsequently consulted. They documented the patient with a schizoaffective disorder and polysubstance abuse. Patient has known history of recurrent intentional drug overdoses in the past requiring hospitalization. Psychiatrist agreed with discontinuing Seroquel and starting IM Zyprexa as needed for agitation and aggressive behavior. The plan is to transition patient to a psychiatric hospital once medically stable. She did receive 10 mg of IV Haldol earlier this morning as well as intermittent Ativan since admission. He has received 2 doses of 5 mg of IM Zyprexa overnight for agitation. Oral Zyprexa has been ordered but not given due to altered mental status.  SUBJECTIVE: Patient more agitated and verbally aggressive overnight. Patient more awake and cooperative at the time of my exam. Denies any pain or difficulty breathing. Patient appears more oriented at this time for sure.  REVIEW OF SYSTEMS:  Unable to obtain with mildly altered mental status.  VITAL SIGNS: BP (!) 151/106   Pulse (!) 58   Temp 97.7 F (36.5 C) (Axillary)   Resp 12   Ht 5\' 10"  (1.778 m)   Wt 193 lb 2 oz (87.6 kg)   SpO2 100%   BMI 27.71 kg/m   HEMODYNAMICS:    VENTILATOR SETTINGS:    INTAKE / OUTPUT: I/O  last 3 completed shifts: In: 665.1 [P.O.:200; I.V.:415.1; IV Piggyback:50] Out: 140 [Urine:140]  PHYSICAL EXAMINATION: General:  Eyes closed. Sitter at bedside. No distress. Currently in 4. restraints. Integument:  Warm & dry. No rash on exposed skin.  Lymphatics:  No appreciated cervical or supraclavicular lymphadenoapthy. Cardiovascular:  Regular rate. No edema. No appreciable JVD.  Pulmonary:  Good aeration & clear to auscultation bilaterally. Normal work of breathing on room air. Abdomen: Soft. Normal bowel sounds. Protuberant.  Neurological:  No meningismus. Very drowsy. Oriented to person, place, year, and president however.  LABS:  BMET  Recent Labs Lab 10/06/16 1936 10/07/16 0448 10/09/16 0800  NA 141 141 142  K 3.9 3.7 3.9  CL 111 113* 115*  CO2 24 22 19*  BUN 17 16 33*  CREATININE 1.75* 1.65* 1.94*  GLUCOSE 97 93 100*    Electrolytes  Recent Labs Lab 10/06/16 1936 10/07/16 0448 10/09/16 0800  CALCIUM 9.0 9.1 9.1  MG 2.3  --   --   PHOS 1.8*  --   --     CBC  Recent Labs Lab 10/06/16 0624 10/07/16 0448 10/09/16 0800  WBC 8.4 8.9 8.0  HGB 12.3* 12.8* 13.9  HCT 35.6* 39.0 42.2  PLT 264 245 219    Coag's  Recent Labs Lab 10/07/16 0448  INR 1.12    Sepsis Markers No results for input(s): LATICACIDVEN, PROCALCITON, O2SATVEN in the last 168 hours.  ABG No results for input(s): PHART, PCO2ART, PO2ART in the last 168 hours.  Liver Enzymes  Recent Labs Lab 10/06/16 1936 10/07/16 0448 10/09/16 0800  AST 26 26 37  ALT 19 19 19   ALKPHOS 59 62 63  BILITOT 1.1 1.2 1.3*  ALBUMIN 3.9 4.1 3.8    Cardiac Enzymes No results for input(s): TROPONINI, PROBNP in the last 168 hours.  Glucose No results for input(s): GLUCAP in the last 168 hours.  Imaging No results found.   STUDIES:  EKG 11/23:  Personally reviewed by me. Left axis deviation. Normal sinus rhythm. QTc 450ms.   MICROBIOLOGY: MRSA PCR 11/23:   Positive  ANTIBIOTICS: None.  SIGNIFICANT EVENTS: 11/22 - Admit 11/24 - PCCM consulted for Precedex with ongoing delirium/agitation  LINES/TUBES: PIV  ASSESSMENT / PLAN:  60 y.o. male with known history of cocaine use as well as tobacco and alcohol. Patient with intentional overdose of Seroquel and ongoing agitation and altered mental status. It's unclear exactly how much alcohol the patient may be using on a chronic basis. The patient's degree of encephalopathy seems to be improving slowly. He may benefit from transitioning from a Precedex infusion to a clonidine patch. Also of interest is the patient's low serum vitamin B12 level. This could certainly be contributing.  1. Intentional overdose/suicide attempt: Patient planned for inpatient hospitalization at psychiatric Hospital once medically stable. Evaluated by psychiatry. 2. Acute encephalopathy/delirium: Continuing Precedex infusion. Starting Clonidine Patch TTS-2. Continuing IV hydroxyzine, Ativan, and Zyprexa as needed. 3. Alcohol use: Continuing Thiamine 500 mg IV every 8 hours 3 days. 4. Tobacco use disorder: Nicotine patch 7 mg/24. 5. Hypertension:  On Hydralazine IV q6hr. Starting Clonidine TTS-2 patch. 6. Possible B12 deficiency:  Vitamin B12 102mcg IM x1. Further replacement as per primary service.  Remainder of care as per primary service.  Sonia Baller Ashok Cordia, M.D. [facility] Pulmonary & Critical Care Pager:  [phone] After 3pm or if no response, call [phone] 10/09/2016, 1:49 PM

## 2016-10-10 LAB — BASIC METABOLIC PANEL
ANION GAP: 10 (ref 5–15)
BUN: 28 mg/dL — ABNORMAL HIGH (ref 6–20)
CALCIUM: 8.6 mg/dL — AB (ref 8.9–10.3)
CHLORIDE: 115 mmol/L — AB (ref 101–111)
CO2: 17 mmol/L — ABNORMAL LOW (ref 22–32)
CREATININE: 1.71 mg/dL — AB (ref 0.61–1.24)
GFR calc non Af Amer: 42 mL/min — ABNORMAL LOW (ref >60)
GFR, EST AFRICAN AMERICAN: 49 mL/min — AB (ref >60)
Glucose, Bld: 109 mg/dL — ABNORMAL HIGH (ref 65–99)
Potassium: 3.6 mmol/L (ref 3.5–5.1)
SODIUM: 142 mmol/L (ref 135–145)

## 2016-10-10 MED ORDER — ATORVASTATIN CALCIUM 10 MG PO TABS
ORAL_TABLET | ORAL | Status: AC
  Filled 2016-10-10: qty 1

## 2016-10-10 MED ORDER — HALOPERIDOL LACTATE 5 MG/ML IJ SOLN
10.0000 mg | Freq: Once | INTRAMUSCULAR | Status: AC
  Administered 2016-10-10: 10 mg via INTRAVENOUS

## 2016-10-10 MED ORDER — HALOPERIDOL LACTATE 5 MG/ML IJ SOLN
1.0000 mg | Freq: Once | INTRAMUSCULAR | Status: AC
  Administered 2016-10-10: 1 mg via INTRAVENOUS
  Filled 2016-10-10: qty 1

## 2016-10-10 MED ORDER — HALOPERIDOL LACTATE 5 MG/ML IJ SOLN
INTRAMUSCULAR | Status: AC
  Filled 2016-10-10: qty 2

## 2016-10-10 MED ORDER — HALOPERIDOL LACTATE 5 MG/ML IJ SOLN: 10.0000 mg | Freq: Four times a day (QID) | INTRAMUSCULAR | Status: DC | PRN
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
## 2016-10-10 NOTE — Progress Notes (Signed)
Pt jerking on wrist and waist restraints.  Pt would not lay back down. Pt was able to get right hand out of restraint. Security called.  Informed Dr. Verlon Au and ankle restraints ordered.  Irven Baltimore, RN
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
## 2016-10-10 NOTE — Progress Notes (Signed)
PULMONARY / CRITICAL CARE MEDICINE   Name: Darryl Parsons MRN: [id number] DOB: 1956-01-28    ADMISSION DATE:  10/06/2016 CONSULTATION DATE:  10/06/2016  REFERRING MD:  Verneita Griffes, M.D. / [facility]  CHIEF COMPLAINT:  Acute Encephalopathy  HISTORY OF PRESENT ILLNESS:  60 y.o. male with Seroquel overdose. History obtained from the patient's hospitalist as well as the electronic medical record given altered mental status. Patient has had visual hallucinations with a known history of cocaine use and bipolar disorder/depression. Patient presented to the emergency department with his girlfriend informing the EMS staff that he had taken 8-10 pills of 300 mg Seroquel. Patient was confused on admission. Psychiatry was subsequently consulted. They documented the patient with a schizoaffective disorder and polysubstance abuse. Patient has known history of recurrent intentional drug overdoses in the past requiring hospitalization. Psychiatrist agreed with discontinuing Seroquel and starting IM Zyprexa as needed for agitation and aggressive behavior. The plan is to transition patient to a psychiatric hospital once medically stable. She did receive 10 mg of IV Haldol earlier this morning as well as intermittent Ativan since admission. He has received 2 doses of 5 mg of IM Zyprexa overnight for agitation. Oral Zyprexa has been ordered but not given due to altered mental status.  SUBJECTIVE: Patient more agitated overnight and with hypertension still. Restarted home Norvasc by [place]. Patient denies any pain or difficulty breathing. Denies any nausea. He is oriented but still hallucinating.  REVIEW OF SYSTEMS:  Unable to obtain with altered mental status.  VITAL SIGNS: BP (!) 147/90   Pulse 83   Temp 99.5 F (37.5 C) (Axillary)   Resp 19   Ht 5\' 10"  (1.778 m)   Wt 193 lb 2 oz (87.6 kg)   SpO2 100%   BMI 27.71 kg/m   HEMODYNAMICS:    VENTILATOR SETTINGS:    INTAKE / OUTPUT: I/O last 3  completed shifts: In: 2500 [I.V.:2350; IV Piggyback:150] Out: 1560 [Urine:1560]  PHYSICAL EXAMINATION: General:  Eyes closed. Sitter at bedside. Currently restraints often time. Patient laying on his left side.osed skin.  Lymphatics:  No appreciated cervical or supraclavicular lymphadenoapthy. Cardiovascular:  Regular rate. No edema. No appreciable JVD.  Pulmonary:  .No accessory muscle use on room air. Clear on auscultation. Abdomen: Soft. Normal bowel sounds. Protuberant.  Neurological:  No meningismus. Very drowsy. Oriented to person, place, year, and president.however, patient is reaching for a "box" that he reports is his and beside the bed.   LABS:  BMET  Recent Labs Lab 10/07/16 0448 10/09/16 0800 10/10/16 0346  NA 141 142 142  K 3.7 3.9 3.6  CL 113* 115* 115*  CO2 22 19* 17*  BUN 16 33* 28*  CREATININE 1.65* 1.94* 1.71*  GLUCOSE 93 100* 109*    Electrolytes  Recent Labs Lab 10/06/16 1936 10/07/16 0448 10/09/16 0800 10/10/16 0346  CALCIUM 9.0 9.1 9.1 8.6*  MG 2.3  --   --   --   PHOS 1.8*  --   --   --     CBC  Recent Labs Lab 10/06/16 0624 10/07/16 0448 10/09/16 0800  WBC 8.4 8.9 8.0  HGB 12.3* 12.8* 13.9  HCT 35.6* 39.0 42.2  PLT 264 245 219    Coag's  Recent Labs Lab 10/07/16 0448  INR 1.12    Sepsis Markers No results for input(s): LATICACIDVEN, PROCALCITON, O2SATVEN in the last 168 hours.  ABG No results for input(s): PHART, PCO2ART, PO2ART in the last 168 hours.  Liver Enzymes  Recent Labs Lab 10/06/16 1936 10/07/16 0448 10/09/16 0800  AST 26 26 37  ALT 19 19 19   ALKPHOS 59 62 63  BILITOT 1.1 1.2 1.3*  ALBUMIN 3.9 4.1 3.8    Cardiac Enzymes No results for input(s): TROPONINI, PROBNP in the last 168 hours.  Glucose No results for input(s): GLUCAP in the last 168 hours.  Imaging No results found.   STUDIES:  EKG 11/23:  Previously reviewed by me. Left axis deviation. Normal sinus rhythm. QTc 447ms.    MICROBIOLOGY: MRSA PCR 11/23:  Positive  ANTIBIOTICS: None.  SIGNIFICANT EVENTS: 11/22 - Admit 11/24 - PCCM consulted for Precedex with ongoing delirium/agitation  LINES/TUBES: PIV  ASSESSMENT / PLAN:  60 y.o. male with known history of cocaine use as well as tobacco and alcohol. Patient with intentional overdose of Seroquel and ongoing agitation and altered mental status.Patient is having hallucinations still. Some mild improvement with regards to his acute encephalopathy and delirium on his current regimen. Blood pressure seems to be better controlled at this time. Attempting to wean Precedex infusion again today.   1. Intentional overdose/suicide attempt: Patient planned for inpatient hospitalization at psychiatric Hospital once medically stable. Evaluated by psychiatry. 2. Acute encephalopathy/delirium: Continuing Precedex infusion. Clonidine Patch TTS-2 & Zyprexa PO. Continuing IV hydroxyzine, Ativan, and Zyprexa as needed. 3. Alcohol use: Continuing Thiamine 500 mg IV every 8 hours 3 days. 4. Tobacco use disorder: Nicotine patch 7 mg/24. 5. Hypertension:  Clonidine TTS-2 patch, Lopressor IV q6hr, & Norvasc 10mg  daily. Hydralazine ordered as needed. 6. Possible B12 deficiency:  S/P Vitamin B12 1042mcg IM x1. Further replacement as per primary service.  Remainder of care as per primary service.  Sonia Baller Ashok Cordia, M.D. [facility] Pager:  [phone] After 3pm or if no response, call [phone] 10/10/2016, 7:21 AM
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
## 2016-10-11 DIAGNOSIS — Z841 Family history of disorders of kidney and ureter: Secondary | ICD-10-CM

## 2016-10-11 DIAGNOSIS — T43592A Poisoning by other antipsychotics and neuroleptics, intentional self-harm, initial encounter: Principal | ICD-10-CM

## 2016-10-11 DIAGNOSIS — T50902D Poisoning by unspecified drugs, medicaments and biological substances, intentional self-harm, subsequent encounter: Secondary | ICD-10-CM

## 2016-10-11 DIAGNOSIS — Z818 Family history of other mental and behavioral disorders: Secondary | ICD-10-CM

## 2016-10-11 DIAGNOSIS — Z8489 Family history of other specified conditions: Secondary | ICD-10-CM

## 2016-10-11 DIAGNOSIS — F17210 Nicotine dependence, cigarettes, uncomplicated: Secondary | ICD-10-CM

## 2016-10-11 DIAGNOSIS — F259 Schizoaffective disorder, unspecified: Secondary | ICD-10-CM

## 2016-10-11 DIAGNOSIS — T1491XA Suicide attempt, initial encounter: Secondary | ICD-10-CM

## 2016-10-11 DIAGNOSIS — Z79899 Other long term (current) drug therapy: Secondary | ICD-10-CM

## 2016-10-11 MED ORDER — DEXTROSE IN LACTATED RINGERS 5 % IV SOLN
INTRAVENOUS | Status: DC
  Administered 2016-10-11 – 2016-10-13 (×4): via INTRAVENOUS

## 2016-10-11 MED ORDER — ACETAMINOPHEN 325 MG PO TABS
650.0000 mg | ORAL_TABLET | Freq: Four times a day (QID) | ORAL | Status: DC | PRN
  Administered 2016-10-11 (×2): 325 mg via ORAL
  Administered 2016-10-12: 650 mg via ORAL
  Filled 2016-10-11 (×4): qty 2

## 2016-10-11 NOTE — Consult Note (Signed)
[place] Psychiatry Consult   Reason for Consult:  Schizophrenia, intentional overdose Referring Physician:  Dr. Verlon Au Patient Identification: Darryl Parsons MRN:  [id number] Principal Diagnosis: Overdose Diagnosis:   Patient Active Problem List   Diagnosis Date Noted  . Hypovitaminosis [E56.9]   . Tobacco use disorder [F17.200]   . Overdose [T50.901A] 10/06/2016  . Schizoaffective disorder, depressive type ([place]) [F25.1] 08/27/2016  . Cocaine use disorder, mild, abuse [F14.10] 08/27/2016  . Essential hypertension [I10] 08/27/2016  . Chronic headache [R51] 08/27/2016  . Suicidal ideation [R45.851] 02/03/2016  . Acute encephalopathy [G93.40] 01/30/2016  . CKD (chronic kidney disease) stage 4, GFR 15-29 ml/min (HCC) [N18.4] 01/30/2016  . Benign essential HTN [I10] 01/30/2016  . Cocaine abuse [F14.10] 08/21/2012  . Hypokalemia [E87.6] 08/21/2012    Total Time spent with patient: 45 minutes  Subjective:   Darryl Parsons is a 60 y.o. male patient admitted with intentional drug overdose  HPI: Darryl Parsons is a 60 years old male with a history of schizophrenia versus schizoaffective disorder, polysubstance abuse admitted to [facility] as a status post intentional drug overdose as a suicide attempt. Patient seen, chart reviewed for the face-to-face psychiatric consultation and evaluation. Patient is a poor historian reportedly he overdosed on his medication Seroquel 300 mg taken 10 of them and required 4 point restraints because of increased agitation. Reportedly patient is married and unemployed, lives with his wife in [address]. Patient has a previous suicidal attempt required acute psychiatric hospitalization from October 12 to 08/31/2016.    Past Psychiatric History: Schizoaffective disorder and polysubstance abuse including cocaine abuse  Interval history: Patient seen today with LCSW for psychiatric consultation follow-up. Patient was not able to provide  any history as he has been restrained physically and chemically secondary to aggressive behavior throughout last night as per the staff reports. Patient received 2 doses of Zyprexa 5 mg each and also Haldol 10 mg intramuscular overnight to control his aggressive behavior. Patient is also transferred to critical care medicine and transferred to the ICU. Patient does not appear to be in distress during this visit.  10/11/2016 Interval History: Patient seen today for psychiatric consultation follow-up at intensive care unit after [facility]. Patient appeared sitting in his bed and eating his meal tray without distress. Patient reported he has been feeling much better and has no reported irritability, agitation or aggressive behaviors. Staff RN reported patient has been receiving smaller dose of precedex to control his agitated behaviors. Patient also reported staff members has been misinterpreting his behaviors and attitudes as a aggression. Patient operated extremity resistance has been losing so that he can use his own hands to eat his meals but continued to have soft restraints to his ankles because he has been walking away from his room and continued to be confused.    Risk to Self: Is patient at risk for suicide?: No Risk to Others:   Prior Inpatient Therapy:   Prior Outpatient Therapy:    Past Medical History:  Past Medical History:  Diagnosis Date  . Chronic kidney disease   . Headache   . Hypertension   . Low back pain   . Mental disorder    History reviewed. No pertinent surgical history. Family History:  Family History  Problem Relation Age of Onset  . Cardiomyopathy Sister   . Lupus Sister   . Kidney failure Sister   . Factor VIII deficiency Neg Hx   . Mental illness Neg Hx  Family Psychiatric  History: Noncontributory Social History:  History  Alcohol Use  . Yes    Comment: 1-2 beers a month     History  Drug Use  . Types: Cocaine    Social History    Social History  . Marital status: Single    Spouse name: N/A  . Number of children: N/A  . Years of education: N/A   Social History Main Topics  . Smoking status: Current Every Day Smoker    Packs/day: 0.50    Types: Cigarettes  . Smokeless tobacco: Never Used  . Alcohol use Yes     Comment: 1-2 beers a month  . Drug use:     Types: Cocaine  . Sexual activity: No   Other Topics Concern  . None   Social History Narrative  . None   Additional Social History:    Allergies:   Allergies  Allergen Reactions  . Other     Pt states he is allergic to a blood pressure pill. He is unable to state reaction. He is spelling it adelatt and aderlatt.    Labs:  Results for orders placed or performed during the hospital encounter of 10/06/16 (from the past 48 hour(s))  Basic metabolic panel     Status: Abnormal   Collection Time: 10/10/16  3:46 AM  Result Value Ref Range   Sodium 142 135 - 145 mmol/L   Potassium 3.6 3.5 - 5.1 mmol/L   Chloride 115 (H) 101 - 111 mmol/L   CO2 17 (L) 22 - 32 mmol/L   Glucose, Bld 109 (H) 65 - 99 mg/dL   BUN 28 (H) 6 - 20 mg/dL   Creatinine, Ser 1.71 (H) 0.61 - 1.24 mg/dL   Calcium 8.6 (L) 8.9 - 10.3 mg/dL   GFR calc non Af Amer 42 (L) >60 mL/min   GFR calc Af Amer 49 (L) >60 mL/min    Comment: (NOTE) The eGFR has been calculated using the CKD EPI equation. This calculation has not been validated in all clinical situations. eGFR's persistently <60 mL/min signify possible Chronic Kidney Disease.    Anion gap 10 5 - 15    Current Facility-Administered Medications  Medication Dose Route Frequency Provider Last Rate Last Dose  . 0.9 %  sodium chloride infusion   Intravenous Continuous Nita Sells, MD 75 mL/hr at 10/11/16 0600    . amLODipine (NORVASC) tablet 10 mg  10 mg Oral Daily Rigoberto Noel, MD   10 mg at 10/11/16 0852  . Chlorhexidine Gluconate Cloth 2 % PADS 6 each  6 each Topical Q0600 Nita Sells, MD   6 each at  10/11/16 0852  . cloNIDine (CATAPRES - Dosed in mg/24 hr) patch 0.2 mg  0.2 mg Transdermal Weekly Javier Glazier, MD   0.2 mg at 10/09/16 1622  . dexmedetomidine (PRECEDEX) 200 MCG/50ML (4 mcg/mL) infusion  0.4-1.2 mcg/kg/hr Intravenous Titrated Javier Glazier, MD 21.9 mL/hr at 10/11/16 0853 1 mcg/kg/hr at 10/11/16 0853  . enoxaparin (LOVENOX) injection 40 mg  40 mg Subcutaneous Q24H Nita Sells, MD   40 mg at 10/10/16 2154  . haloperidol lactate (HALDOL) injection 10 mg  10 mg Intravenous Q6H PRN Javier Glazier, MD      . hydrALAZINE (APRESOLINE) injection 10-40 mg  10-40 mg Intravenous Q6H PRN Rigoberto Noel, MD   40 mg at 10/09/16 2004  . hydrOXYzine (VISTARIL) injection 100 mg  100 mg Intramuscular Q6H PRN Gardiner Barefoot, NP   100  mg at 10/08/16 0333  . LORazepam (ATIVAN) injection 4 mg  4 mg Intravenous Q6H PRN Gardiner Barefoot, NP   4 mg at 10/10/16 2100  . metoprolol (LOPRESSOR) injection 5 mg  5 mg Intravenous Q6H Rigoberto Noel, MD   5 mg at 10/11/16 0629  . mupirocin ointment (BACTROBAN) 2 % 1 application  1 application Nasal BID Nita Sells, MD   1 application at 63/84/66 0905  . nicotine (NICODERM CQ - dosed in mg/24 hr) patch 7 mg  7 mg Transdermal Daily Javier Glazier, MD   7 mg at 10/11/16 0852  . OLANZapine (ZYPREXA) injection 5 mg  5 mg Intramuscular Q8H PRN Nita Sells, MD   5 mg at 10/10/16 0405  . OLANZapine zydis (ZYPREXA) disintegrating tablet 10 mg  10 mg Oral Daily Nita Sells, MD   10 mg at 10/11/16 0852  . thiamine 560m in normal saline (549m IVPB  500 mg Intravenous Q8H JeJavier GlazierMD   500 mg at 10/11/16 [id number]  Musculoskeletal: Strength & Muscle Tone: decreased Gait & Station: unable to stand Patient leans: N/A  Psychiatric Specialty Exam: Physical Exam as per history and physical   ROS unable to obtain secondary to poor historian/altered mental status   Blood pressure (!) 153/103, pulse 75, temperature  97.7 F (36.5 C), temperature source Axillary, resp. rate 16, height '5\' 10"'  (1.778 m), weight 87.6 kg (193 lb 2 oz), SpO2 99 %.Body mass index is 27.71 kg/m.  General Appearance: Guarded  Eye Contact:  Fair  Speech:  Slow and Slurred  Volume:  Decreased  Mood:  Anxious and Depressed  Affect:  Constricted and Depressed  Thought Process:  Coherent  Orientation:  Full (Time, Place, and Person)  Thought Content:  Paranoid Ideation and Rumination  Suicidal Thoughts:  Yes.  with intent/plan  Homicidal Thoughts:  No  Memory:  Immediate;   Fair Recent;   Fair Remote;   Poor  Judgement:  Impaired  Insight:  Shallow  Psychomotor Activity:  Decreased  Concentration:  Concentration: Fair and Attention Span: Poor  Recall:  Poor  Fund of Knowledge:  Poor  Language:  Fair  Akathisia:  Negative  Handed:  Right  AIMS (if indicated):     Assets:  FiCatering managerousing Intimacy Leisure Time Resilience Social Support  ADL's:  Impaired  Cognition:  Impaired,  Mild  Sleep:        Treatment Plan Summary: [age]ears old male with schizoaffective disorder, polysubstance abuse admitted for status post intentional drug overdose and has recurrent episodes which required acute psychiatric hospitalization in the past. Patient is currently restrained both upper extremities and lower extremities secondary to agitation, patient meet criteria for acute psychiatric hospitalization when medically stable.  Patient got worse with his psychosis, agitation and aggressive behavior overnight required both chemical and physical restraints and also transferring from regular unit to the intensive care unit under the care of critical care services.  Continue safety sitter: Patient is not reliable historian and cannot contract for safety at this time Haldol, Vistaril and Zyprexa 5 mg IM every 8 hours as needed for agitation and aggressive behaviors Recommend to provide benztropine 1 mg every 8 hours as  needed for extrapyramidal symptoms. Monitor EKG for QTC prolongation Daily contact with patient to assess and evaluate symptoms and progress in treatment and Medication management  Appreciate psychiatric consultation and follow up as clinically required Please contact [phone] or [phone] if needs further assistance  Disposition: Recommend psychiatric Inpatient admission when medically cleared. Supportive therapy provided about ongoing stressors.  Ambrose Finland, MD 10/11/2016 11:03 AM

## 2016-10-11 NOTE — Progress Notes (Signed)
PULMONARY / CRITICAL CARE MEDICINE   Name: Darryl Parsons MRN: [id number] DOB: 04/04/1956    ADMISSION DATE:  10/06/2016 CONSULTATION DATE:  10/06/2016  REFERRING MD:  Verneita Griffes, M.D. / [facility]  CHIEF COMPLAINT:  Acute Encephalopathy  SUBJECTIVE: Calm in room on precedex gtt . VITAL SIGNS: BP (!) 153/103   Pulse 75   Temp 97.7 F (36.5 C) (Axillary)   Resp 16   Ht 5\' 10"  (1.778 m)   Wt 193 lb 2 oz (87.6 kg)   SpO2 99%   BMI 27.71 kg/m   HEMODYNAMICS:    VENTILATOR SETTINGS:    INTAKE / OUTPUT: I/O last 3 completed shifts: In: 3731.1 [P.O.:50; I.V.:3531.1; IV Piggyback:150] Out: 2120 [Urine:2120]  PHYSICAL EXAMINATION: General:  Eyes closed. Sitter at bedside. Currently restraint off. No distress.  Lymphatics:  No appreciated cervical or supraclavicular lymphadenoapthy. Cardiovascular:  Regular rate. No edema. No appreciable JVD.  Pulmonary:  .No accessory muscle use on room air. Clear on auscultation. Abdomen: Soft. Normal bowel sounds. Protuberant.  Neurological:  No meningismus. Very drowsy. Oriented to person, place, year, and president.however, patient is reaching for a "box" that he reports is his and beside the bed.   LABS:  BMET  Recent Labs Lab 10/07/16 0448 10/09/16 0800 10/10/16 0346  NA 141 142 142  K 3.7 3.9 3.6  CL 113* 115* 115*  CO2 22 19* 17*  BUN 16 33* 28*  CREATININE 1.65* 1.94* 1.71*  GLUCOSE 93 100* 109*    Electrolytes  Recent Labs Lab 10/06/16 1936 10/07/16 0448 10/09/16 0800 10/10/16 0346  CALCIUM 9.0 9.1 9.1 8.6*  MG 2.3  --   --   --   PHOS 1.8*  --   --   --     CBC  Recent Labs Lab 10/06/16 0624 10/07/16 0448 10/09/16 0800  WBC 8.4 8.9 8.0  HGB 12.3* 12.8* 13.9  HCT 35.6* 39.0 42.2  PLT 264 245 219    Coag's  Recent Labs Lab 10/07/16 0448  INR 1.12    Sepsis Markers No results for input(s): LATICACIDVEN, PROCALCITON, O2SATVEN in the last 168 hours.  ABG No results for input(s): PHART,  PCO2ART, PO2ART in the last 168 hours.  Liver Enzymes  Recent Labs Lab 10/06/16 1936 10/07/16 0448 10/09/16 0800  AST 26 26 37  ALT 19 19 19   ALKPHOS 59 62 63  BILITOT 1.1 1.2 1.3*  ALBUMIN 3.9 4.1 3.8    Cardiac Enzymes No results for input(s): TROPONINI, PROBNP in the last 168 hours.  Glucose No results for input(s): GLUCAP in the last 168 hours.  Imaging No results found.   STUDIES:  EKG 11/23:  Previously reviewed by me. Left axis deviation. Normal sinus rhythm. QTc 439ms.   MICROBIOLOGY: MRSA PCR 11/23:  Positive  ANTIBIOTICS: None.  SIGNIFICANT EVENTS: 11/22 - Admit 11/24 - PCCM consulted for Precedex with ongoing delirium/agitation  LINES/TUBES: PIV  ASSESSMENT / PLAN:    60 y.o. male with known history of cocaine use as well as tobacco and alcohol. Patient with intentional overdose of Seroquel and ongoing agitation and altered mental status. Seems to be getting a little better. For today plan: continue to wean precedex,change IVF to [facility] in setting of hyperchloremia and cont supportive care. Hopefully we can get him off precedex in next 24 hours.   1. Intentional overdose/suicide attempt: Patient planned for inpatient hospitalization at psychiatric Hospital once medically stable. Evaluated by psychiatry. 2. Acute encephalopathy/delirium: Continuing Precedex infusion; weaning this down as  tolerated. Clonidine Patch TTS-2 & Zyprexa PO. Continuing IV hydroxyzine, Ativan, and Zyprexa as needed. 3. Alcohol use: Continuing Thiamine 500 mg IV every 8 hours 3 days scheduled. 4. Tobacco use disorder: Nicotine patch 7 mg/24. 5. Hypertension:  Clonidine TTS-2 patch, Lopressor IV q6hr, & Norvasc 10mg  daily. Hydralazine ordered as needed. 6. Possible B12 deficiency:  S/P Vitamin B12 1076mcg IM x1. Further replacement as per primary service. 7. NAGMA in setting of hyperchloremia: will change IVF to D5LR 8. Mild AKI-->improving.   Remainder of care as per primary  service.  Erick Colace ACNP-BC [place] Pager # [phone] OR # [phone] if no answer

## 2016-10-11 NOTE — Progress Notes (Signed)
Update given to poison control   Jenita Seashore
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
## 2016-10-11 NOTE — Progress Notes (Signed)
Date:  October 11, 2016 Chart reviewed for concurrent status and case management needs. Will continue to follow patient progress.  Iv precedex Discharge Planning: following for needs Expected discharge date: [id number] Velva Harman, [place], [place], [place]

## 2016-10-12 LAB — MAGNESIUM: Magnesium: 1.9 mg/dL (ref 1.7–2.4)

## 2016-10-12 LAB — CBC
HCT: 34.4 % — ABNORMAL LOW (ref 39.0–52.0)
Hemoglobin: 11.4 g/dL — ABNORMAL LOW (ref 13.0–17.0)
MCH: 27.6 pg (ref 26.0–34.0)
MCHC: 33.1 g/dL (ref 30.0–36.0)
MCV: 83.3 fL (ref 78.0–100.0)
Platelets: 182 K/uL (ref 150–400)
RBC: 4.13 MIL/uL — ABNORMAL LOW (ref 4.22–5.81)
RDW: 14.1 % (ref 11.5–15.5)
WBC: 8.3 K/uL (ref 4.0–10.5)

## 2016-10-12 LAB — COMPREHENSIVE METABOLIC PANEL WITH GFR
ALT: 18 U/L (ref 17–63)
AST: 37 U/L (ref 15–41)
Albumin: 3.4 g/dL — ABNORMAL LOW (ref 3.5–5.0)
Alkaline Phosphatase: 54 U/L (ref 38–126)
Anion gap: 5 (ref 5–15)
BUN: 24 mg/dL — ABNORMAL HIGH (ref 6–20)
CO2: 20 mmol/L — ABNORMAL LOW (ref 22–32)
Calcium: 8.6 mg/dL — ABNORMAL LOW (ref 8.9–10.3)
Chloride: 115 mmol/L — ABNORMAL HIGH (ref 101–111)
Creatinine, Ser: 1.88 mg/dL — ABNORMAL HIGH (ref 0.61–1.24)
GFR calc Af Amer: 43 mL/min — ABNORMAL LOW
GFR calc non Af Amer: 37 mL/min — ABNORMAL LOW
Glucose, Bld: 116 mg/dL — ABNORMAL HIGH (ref 65–99)
Potassium: 3.2 mmol/L — ABNORMAL LOW (ref 3.5–5.1)
Sodium: 140 mmol/L (ref 135–145)
Total Bilirubin: 1.1 mg/dL (ref 0.3–1.2)
Total Protein: 6.1 g/dL — ABNORMAL LOW (ref 6.5–8.1)

## 2016-10-12 LAB — RAPID STREP SCREEN (MED CTR MEBANE ONLY): STREPTOCOCCUS, GROUP A SCREEN (DIRECT): NEGATIVE

## 2016-10-12 LAB — PHOSPHORUS: Phosphorus: 3.7 mg/dL (ref 2.5–4.6)

## 2016-10-12 MED ORDER — MIDAZOLAM HCL 2 MG/2ML IJ SOLN
1.0000 mg | INTRAMUSCULAR | Status: DC | PRN
Start: 2016-10-12 — End: 2016-10-13

## 2016-10-12 MED ORDER — FENTANYL CITRATE (PF) 100 MCG/2ML IJ SOLN: 25.0000 ug | INTRAMUSCULAR | Status: DC | PRN

## 2016-10-12 MED ORDER — MAGNESIUM SULFATE 2 GM/50ML IV SOLN
2.0000 g | Freq: Once | INTRAVENOUS | Status: AC
  Administered 2016-10-12: 2 g via INTRAVENOUS
  Filled 2016-10-12: qty 50

## 2016-10-12 MED ORDER — POTASSIUM CHLORIDE CRYS ER 20 MEQ PO TBCR
40.0000 meq | EXTENDED_RELEASE_TABLET | Freq: Every day | ORAL | Status: DC
  Administered 2016-10-12 – 2016-10-13 (×2): 40 meq via ORAL
  Filled 2016-10-12 (×2): qty 2

## 2016-10-12 MED ORDER — ACETAMINOPHEN 325 MG PO TABS
650.0000 mg | ORAL_TABLET | Freq: Once | ORAL | Status: AC
  Administered 2016-10-12: 650 mg via ORAL
  Filled 2016-10-12: qty 2

## 2016-10-12 MED ORDER — CLONIDINE HCL 0.2 MG/24HR TD PTWK: 0.2000 mg | MEDICATED_PATCH | TRANSDERMAL | 12 refills | Status: DC

## 2016-10-12 MED ORDER — DEXMEDETOMIDINE HCL IN NACL 200 MCG/50ML IV SOLN
0.2000 ug/kg/h | INTRAVENOUS | Status: DC
  Administered 2016-10-12: 0.8 ug/kg/h via INTRAVENOUS
  Filled 2016-10-12: qty 50
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
## 2016-10-12 NOTE — Clinical Social Work Psych Note (Signed)
Clinical Social Worker Psych Service Line Progress Note  Clinical Social Worker: Lia Hopping, LCSW Date/Time: 10/12/2016, 5:22 PM   Review of Patient  Overall Medical Condition:  Medically Stable  Participation Level:  Active Participation Quality: Appropriate Other Participation Quality:  Patient allowed LCSWA to talk with wife in regards to a Safe discharge and follow up  Affect:  (Pleasant and Cooperative) Cognitive: Alert, Oriented Reaction to Medications/Concerns:  Pt. Reacting well to medication, no concerns presented.  Modes of Intervention: Solution-focused   Summary of Progress/Plan at Discharge  Summary of Progress/Plan at [place] met with patient at bedside. Patient and discussed patient disposition and discharge. Patient has Orthoptist through [facility]. The patient understands he is to take discharge paperwork to his visit Thursday Morning.  LCSWA informed patient wife. She is agreeable to make sure patient follows up. Patient reports he will need bus tickets to transport home, LCSWA left on patient chart.  Patient reports not other concerns at this time. Please recontact CSW if needed.   Kathrin Greathouse, Latanya Presser, MSW Clinical Social Worker 5E and Psychiatric Service Line [phone] 10/12/2016  5:21 PM

## 2016-10-12 NOTE — Consult Note (Signed)
[facility] Consult   Reason for Consult:  Schizophrenia, intentional overdose Referring Physician:  Dr. Verlon Au Patient Identification: Darryl Parsons MRN:  [id number] Principal Diagnosis: Overdose Diagnosis:   Patient Active Problem List   Diagnosis Date Noted  . Hypovitaminosis [E56.9]   . Tobacco use disorder [F17.200]   . Overdose [T50.901A] 10/06/2016  . Schizoaffective disorder, depressive type ([place]) [F25.1] 08/27/2016  . Cocaine use disorder, mild, abuse [F14.10] 08/27/2016  . Essential hypertension [I10] 08/27/2016  . Chronic headache [R51] 08/27/2016  . Suicidal ideation [R45.851] 02/03/2016  . Encephalopathy acute [G93.40] 01/30/2016  . CKD (chronic kidney disease) stage 4, GFR 15-29 ml/min (HCC) [N18.4] 01/30/2016  . Benign essential HTN [I10] 01/30/2016  . Cocaine abuse [F14.10] 08/21/2012  . Hypokalemia [E87.6] 08/21/2012    Total Time spent with patient: 45 minutes  Subjective:   Darryl Parsons is a 60 y.o. male patient admitted with intentional drug overdose  HPI: Darryl Parsons is a 60 years old male with a history of schizophrenia versus schizoaffective disorder, polysubstance abuse admitted to [facility] as a status post intentional drug overdose as a suicide attempt. Patient seen, chart reviewed for the face-to-face psychiatric consultation and evaluation. Patient is a poor historian reportedly he overdosed on his medication Seroquel 300 mg taken 10 of them and required 4 point restraints because of increased agitation. Reportedly patient is married and unemployed, lives with his wife in [place]. Patient has a previous suicidal attempt required acute psychiatric hospitalization from October 12 to 08/31/2016.    Past Psychiatric History: Schizoaffective disorder and polysubstance abuse including cocaine abuse  10/12/2016 Interval History: Patient seen today for psychiatric consultation follow-up at intensive care unit of  [facility] Along with the psychiatric LCSW. Patient appeared appea lying in his bed, awake, alert, oriented to time place person and situation. Patient has no red s irritability, agitation or aggressive behaviors itting in his bed and eating his meal tray without distress. Patient reported he has been feeling much better and has no reported irritability, agitation or aggressive behaviors. Staff reported patient has been  doing better without significant behavioral or emotional problems does not required physical or chemical restraints in the last 48 hours. Patient is willing to be discharged home and also asking for possible transport assistance. Patient denied active symptoms of depression, bipolar mania, psychosis and has no suicidal/homicidal ideation, intention or plans.    Risk to Self: Is patient at risk for suicide?: No Risk to Others:   Prior Inpatient Therapy:   Prior Outpatient Therapy:    Past Medical History:  Past Medical History:  Diagnosis Date  . Chronic kidney disease   . Headache   . Hypertension   . Low back pain   . Mental disorder    History reviewed. No pertinent surgical history. Family History:  Family History  Problem Relation Age of Onset  . Cardiomyopathy Sister   . Lupus Sister   . Kidney failure Sister   . Factor VIII deficiency Neg Hx   . Mental illness Neg Hx    Family Psychiatric  History: Noncontributory Social History:  History  Alcohol Use  . Yes    Comment: 1-2 beers a month     History  Drug Use  . Types: Cocaine    Social History   Social History  . Marital status: Single    Spouse name: N/A  . Number of children: N/A  . Years of education: N/A   Social History Main  Topics  . Smoking status: Current Every Day Smoker    Packs/day: 0.50    Types: Cigarettes  . Smokeless tobacco: Never Used  . Alcohol use Yes     Comment: 1-2 beers a month  . Drug use:     Types: Cocaine  . Sexual activity: No   Other Topics Concern   . None   Social History Narrative  . None   Additional Social History:    Allergies:   Allergies  Allergen Reactions  . Other     Pt states he is allergic to a blood pressure pill. He is unable to state reaction. He is spelling it adelatt and aderlatt.    Labs:  Results for orders placed or performed during the hospital encounter of 10/06/16 (from the past 48 hour(s))  CBC     Status: Abnormal   Collection Time: 10/12/16  3:26 AM  Result Value Ref Range   WBC 8.3 4.0 - 10.5 K/uL   RBC 4.13 (L) 4.22 - 5.81 MIL/uL   Hemoglobin 11.4 (L) 13.0 - 17.0 g/dL   HCT 34.4 (L) 39.0 - 52.0 %   MCV 83.3 78.0 - 100.0 fL   MCH 27.6 26.0 - 34.0 pg   MCHC 33.1 30.0 - 36.0 g/dL   RDW 14.1 11.5 - 15.5 %   Platelets 182 150 - 400 K/uL  Comprehensive metabolic panel     Status: Abnormal   Collection Time: 10/12/16  3:26 AM  Result Value Ref Range   Sodium 140 135 - 145 mmol/L   Potassium 3.2 (L) 3.5 - 5.1 mmol/L   Chloride 115 (H) 101 - 111 mmol/L   CO2 20 (L) 22 - 32 mmol/L   Glucose, Bld 116 (H) 65 - 99 mg/dL   BUN 24 (H) 6 - 20 mg/dL   Creatinine, Ser 1.88 (H) 0.61 - 1.24 mg/dL   Calcium 8.6 (L) 8.9 - 10.3 mg/dL   Total Protein 6.1 (L) 6.5 - 8.1 g/dL   Albumin 3.4 (L) 3.5 - 5.0 g/dL   AST 37 15 - 41 U/L   ALT 18 17 - 63 U/L   Alkaline Phosphatase 54 38 - 126 U/L   Total Bilirubin 1.1 0.3 - 1.2 mg/dL   GFR calc non Af Amer 37 (L) >60 mL/min   GFR calc Af Amer 43 (L) >60 mL/min    Comment: (NOTE) The eGFR has been calculated using the CKD EPI equation. This calculation has not been validated in all clinical situations. eGFR's persistently <60 mL/min signify possible Chronic Kidney Disease.    Anion gap 5 5 - 15  Magnesium     Status: None   Collection Time: 10/12/16  3:26 AM  Result Value Ref Range   Magnesium 1.9 1.7 - 2.4 mg/dL  Phosphorus     Status: None   Collection Time: 10/12/16  3:26 AM  Result Value Ref Range   Phosphorus 3.7 2.5 - 4.6 mg/dL    Current  Facility-Administered Medications  Medication Dose Route Frequency Provider Last Rate Last Dose  . acetaminophen (TYLENOL) tablet 650 mg  650 mg Oral Q6H PRN Gardiner Barefoot, NP   650 mg at 10/12/16 1509  . amLODipine (NORVASC) tablet 10 mg  10 mg Oral Daily Rigoberto Noel, MD   10 mg at 10/12/16 0910  . cloNIDine (CATAPRES - Dosed in mg/24 hr) patch 0.2 mg  0.2 mg Transdermal Weekly Javier Glazier, MD   0.2 mg at 10/09/16 1622  . dextrose  5 % in lactated ringers infusion   Intravenous Continuous Erick Colace, NP 75 mL/hr at 10/12/16 1405    . enoxaparin (LOVENOX) injection 40 mg  40 mg Subcutaneous Q24H Nita Sells, MD   40 mg at 10/11/16 2114  . haloperidol lactate (HALDOL) injection 10 mg  10 mg Intravenous Q6H PRN Javier Glazier, MD      . hydrALAZINE (APRESOLINE) injection 10-40 mg  10-40 mg Intravenous Q6H PRN Rigoberto Noel, MD   40 mg at 10/09/16 2004  . hydrOXYzine (VISTARIL) injection 100 mg  100 mg Intramuscular Q6H PRN Gardiner Barefoot, NP   100 mg at 10/08/16 0333  . LORazepam (ATIVAN) injection 4 mg  4 mg Intravenous Q6H PRN Gardiner Barefoot, NP   4 mg at 10/10/16 2100  . metoprolol (LOPRESSOR) injection 5 mg  5 mg Intravenous Q6H Rigoberto Noel, MD   5 mg at 10/12/16 1204  . midazolam (VERSED) injection 1-2 mg  1-2 mg Intravenous Q1H PRN Raylene Miyamoto, MD      . mupirocin ointment (BACTROBAN) 2 % 1 application  1 application Nasal BID Nita Sells, MD   1 application at 54/09/81 1032  . nicotine (NICODERM CQ - dosed in mg/24 hr) patch 7 mg  7 mg Transdermal Daily Javier Glazier, MD   7 mg at 10/11/16 0852  . OLANZapine (ZYPREXA) injection 5 mg  5 mg Intramuscular Q8H PRN Nita Sells, MD   5 mg at 10/10/16 0405  . OLANZapine zydis (ZYPREXA) disintegrating tablet 10 mg  10 mg Oral Daily Nita Sells, MD   10 mg at 10/12/16 0910  . potassium chloride SA (K-DUR,KLOR-CON) CR tablet 40 mEq  40 mEq Oral Daily Nita Sells, MD    40 mEq at 10/12/16 0909    Musculoskeletal: Strength & Muscle Tone: decreased Gait & Station: unable to stand Patient leans: N/A  Psychiatric Specialty Exam: Physical Exam as per history and physical   ROS unable to obtain secondary to poor historian/altered mental status   Blood pressure 122/74, pulse 92, temperature 98.6 F (37 C), temperature source Oral, resp. rate 17, height '5\' 10"'  (1.778 m), weight 87.6 kg (193 lb 2 oz), SpO2 100 %.Body mass index is 27.71 kg/m.  General Appearance: Guarded  Eye Contact:  Fair  Speech:  Slow and Slurred  Volume:  Decreased  Mood:  Anxious and Depressed  Affect:  Constricted and Depressed  Thought Process:  Coherent  Orientation:  Full (Time, Place, and Person)  Thought Content:  Paranoid Ideation and Rumination  Suicidal Thoughts:  Yes.  with intent/plan  Homicidal Thoughts:  No  Memory:  Immediate;   Fair Recent;   Fair Remote;   Poor  Judgement:  Impaired  Insight:  Shallow  Psychomotor Activity:  Decreased  Concentration:  Concentration: Fair and Attention Span: Poor  Recall:  Poor  Fund of Knowledge:  Poor  Language:  Fair  Akathisia:  Negative  Handed:  Right  AIMS (if indicated):     Assets:  Catering manager Housing Intimacy Leisure Time Resilience Social Support  ADL's:  Impaired  Cognition:  Impaired,  Mild  Sleep:        Treatment Plan Summary: 60 years old male with schizoaffective disorder, polysubstance abuse admitted for status post intentional drug overdose and has recurrent episodes which required acute psychiatric hospitalization in the past. Patient is currently restrained both upper extremities and lower extremities secondary to agitation, patient meet criteria for acute psychiatric hospitalization when  medically stable.  Patient Has been better with his current treatment protocol without psychosis, agitation and aggressive behavior  Discontinue safety sitter as patient contract for safety and  has improved mental status  Haldol, Vistaril and Zyprexa 5 mg IM every 8 hours as needed for agitation and aggression Patient is on tapering dose of precedex  Appreciate psychiatric consultation and we sign off as of today Please contact [phone] or [phone] if needs further assistance  Disposition: Case discussed with psychiatric LCSW regarding outpatient medication management and medically stable. No evidence of imminent risk to self or others at present.   Patient does not meet criteria for psychiatric inpatient admission. Supportive therapy provided about ongoing stressors.  Ambrose Finland, MD 10/12/2016 3:43 PM
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
## 2016-10-12 NOTE — Progress Notes (Signed)
[facility] Progress Note Patient Name: DEMITRI KUCINSKI DOB: [birth year] MRN: [id number]   Date of Service  10/12/2016  HPI/Events of Note  contiued worsening agitation  eICU Interventions  prec     Intervention Category Major Interventions: Airway management;Arrhythmia - evaluation and management  Raylene Miyamoto. 10/12/2016, 4:36 AM

## 2016-10-12 NOTE — Progress Notes (Signed)
PULMONARY / CRITICAL CARE MEDICINE   Name: Darryl Parsons MRN: [id number] DOB: [birth year]    ADMISSION DATE:  10/06/2016  REFERRING MD:  Verneita Griffes, M.D. / [facility]  CHIEF COMPLAINT:  Acute Encephalopathy  SUBJECTIVE: Pt resting in bed, remains on Precdex gtt. [name] VITAL SIGNS: BP 122/74   Pulse 70   Temp 98.5 F (36.9 C) (Oral)   Resp 15   Ht 5\' 10"  (1.778 m)   Wt 193 lb 2 oz (87.6 kg)   SpO2 98%   BMI 27.71 kg/m   HEMODYNAMICS:    VENTILATOR SETTINGS:    INTAKE / OUTPUT: I/O last 3 completed shifts: In: 3849.8 [P.O.:450; I.V.:3349.8; IV Piggyback:50] Out: 1110 [Urine:1110]  PHYSICAL EXAMINATION: General:  Awake and calm. Sitter at bedside.  No distress.  Lymphatics:  No appreciated cervical or supraclavicular lymphadenoapthy. Cardiovascular:  Regular rate. No edema. No appreciable JVD.  Pulmonary:  .No accessory muscle use on room air. Clear on auscultation. Abdomen: Soft. Normal bowel sounds. Protuberant.  Neurological:  No meningismus.  Oriented to person, place, year, and situation.  States he has thought about what was done and realizes he needs to be present in home, though he also states he wants to be out of [place].   LABS:  BMET  Recent Labs Lab 10/09/16 0800 10/10/16 0346 10/12/16 0326  NA 142 142 140  K 3.9 3.6 3.2*  CL 115* 115* 115*  CO2 19* 17* 20*  BUN 33* 28* 24*  CREATININE 1.94* 1.71* 1.88*  GLUCOSE 100* 109* 116*    Electrolytes  Recent Labs Lab 10/06/16 1936  10/09/16 0800 10/10/16 0346 10/12/16 0326  CALCIUM 9.0  < > 9.1 8.6* 8.6*  MG 2.3  --   --   --  1.9  PHOS 1.8*  --   --   --  3.7  < > = values in this interval not displayed.  CBC  Recent Labs Lab 10/07/16 0448 10/09/16 0800 10/12/16 0326  WBC 8.9 8.0 8.3  HGB 12.8* 13.9 11.4*  HCT 39.0 42.2 34.4*  PLT 245 219 182    Coag's  Recent Labs Lab 10/07/16 0448  INR 1.12    Sepsis Markers No results for input(s): LATICACIDVEN, PROCALCITON,  O2SATVEN in the last 168 hours.  ABG No results for input(s): PHART, PCO2ART, PO2ART in the last 168 hours.  Liver Enzymes  Recent Labs Lab 10/07/16 0448 10/09/16 0800 10/12/16 0326  AST 26 37 37  ALT 19 19 18   ALKPHOS 62 63 54  BILITOT 1.2 1.3* 1.1  ALBUMIN 4.1 3.8 3.4*    Cardiac Enzymes No results for input(s): TROPONINI, PROBNP in the last 168 hours.  Glucose No results for input(s): GLUCAP in the last 168 hours.  Imaging No results found.   STUDIES:  EKG 11/23:  Previously reviewed by me. Left axis deviation. Normal sinus rhythm. QTc 445ms.   MICROBIOLOGY: MRSA PCR 11/23:  Positive  ANTIBIOTICS: None.  SIGNIFICANT EVENTS: 11/22 - Admit 11/24 - PCCM consulted for Precedex with ongoing delirium/agitation  LINES/TUBES: PIV  ASSESSMENT / PLAN:    60 y.o. male with known history of cocaine use as well as tobacco and alcohol. Patient with intentional overdose of Seroquel and ongoing agitation and altered mental status. Seems to be getting a little better. Pt understands what he did and states he wants to leave [place].  He also states that "he has thought about what has happened and realizes that he needs to be home for his  partner and that she would be happy for him to be home."  For today plan:  wean precedex to off, and cont supportive care. Renal fxn fluctuating. Would cont IVFs.   1. Intentional overdose/suicide attempt: Patient planned for inpatient hospitalization at psychiatric Hospital once medically stable. Evaluated by psychiatry.  2. Acute encephalopathy/delirium: Continuing Precedex infusion; weaning this down as tolerated. Will dc today. Clonidine Patch TTS-2 & Zyprexa PO. Continuing IV hydroxyzine, Ativan, and Zyprexa as needed.  3. Alcohol use: Continuing Thiamine 500 mg IV every 8 hours 3 days scheduled. 4. Tobacco use disorder: Nicotine patch 7 mg/24.  5. Hypertension:  Clonidine TTS-2 patch, Lopressor IV q6hr, & Norvasc 10mg  daily.  Hydralazine ordered as needed.  6. Possible B12 deficiency:  S/P Vitamin B12 1064mcg IM x1. Further replacement as per primary service. 7.  8. NAGMA in setting of hyperchloremia: will changed IVF to D5LR-->improved.   9. Mild AKI--> cont IVFs. Avoid hypotension.   Remainder of care as per primary service.  Erick Colace ACNP-BC [place] Pager # [phone] OR # [phone] if no answer

## 2016-10-13 ENCOUNTER — Inpatient Hospital Stay (HOSPITAL_COMMUNITY): Payer: Medicaid Other

## 2016-10-13 DIAGNOSIS — R509 Fever, unspecified: Secondary | ICD-10-CM

## 2016-10-13 DIAGNOSIS — F1410 Cocaine abuse, uncomplicated: Secondary | ICD-10-CM

## 2016-10-13 LAB — MAGNESIUM: MAGNESIUM: 2 mg/dL (ref 1.7–2.4)

## 2016-10-13 LAB — CBC
HCT: 37.3 % — ABNORMAL LOW (ref 39.0–52.0)
HEMOGLOBIN: 12.5 g/dL — AB (ref 13.0–17.0)
MCH: 27.7 pg (ref 26.0–34.0)
MCHC: 33.5 g/dL (ref 30.0–36.0)
MCV: 82.7 fL (ref 78.0–100.0)
Platelets: 244 10*3/uL (ref 150–400)
RBC: 4.51 MIL/uL (ref 4.22–5.81)
RDW: 14.3 % (ref 11.5–15.5)
WBC: 10.4 10*3/uL (ref 4.0–10.5)

## 2016-10-13 LAB — URINALYSIS, ROUTINE W REFLEX MICROSCOPIC
Bilirubin Urine: NEGATIVE
Glucose, UA: 100 mg/dL — AB
Ketones, ur: NEGATIVE mg/dL
Leukocytes, UA: NEGATIVE
NITRITE: NEGATIVE
Protein, ur: 30 mg/dL — AB
SPECIFIC GRAVITY, URINE: 1.016 (ref 1.005–1.030)
pH: 6.5 (ref 5.0–8.0)

## 2016-10-13 LAB — URINE MICROSCOPIC-ADD ON

## 2016-10-13 LAB — CREATININE, SERUM
CREATININE: 1.84 mg/dL — AB (ref 0.61–1.24)
GFR calc Af Amer: 45 mL/min — ABNORMAL LOW (ref >60)
GFR calc non Af Amer: 38 mL/min — ABNORMAL LOW (ref >60)

## 2016-10-13 LAB — PHOSPHORUS: Phosphorus: 2.8 mg/dL (ref 2.5–4.6)

## 2016-10-13 MED ORDER — METOPROLOL TARTRATE 25 MG PO TABS
100.0000 mg | ORAL_TABLET | Freq: Two times a day (BID) | ORAL | Status: DC
  Administered 2016-10-13: 100 mg via ORAL
  Filled 2016-10-13: qty 4

## 2016-10-13 MED ORDER — IBUPROFEN 200 MG PO TABS
600.0000 mg | ORAL_TABLET | Freq: Once | ORAL | Status: AC
  Administered 2016-10-13: 600 mg via ORAL
  Filled 2016-10-13: qty 3

## 2016-10-13 MED ORDER — METOPROLOL TARTRATE 100 MG PO TABS: 100.0000 mg | ORAL_TABLET | Freq: Two times a day (BID) | ORAL | 0 refills | Status: DC

## 2016-10-13 NOTE — Progress Notes (Addendum)
Discharge education provided. PIV removed. Bus pass provided to patient and clothing returned. AVS provided and reviewed

## 2016-10-13 NOTE — Discharge Summary (Signed)
Physician Discharge Summary  Darryl Parsons RXV:[id number] DOB: July 01, 1956 DOA: 10/06/2016  PCP: PROVIDER NOT IN SYSTEM  Admit date: 10/06/2016 Discharge date: 10/13/2016  Time spent: 35 minutes  Recommendations for Outpatient Follow-up:  1. Patient was cleared for discharge by psychiatry 2. Can resume home medications of Seroquel as well as sertraline as per psychiatrist 3. Would continue clonidine was started this admission  Discharge Diagnoses:  Principal Problem:   Overdose Active Problems:   Hypokalemia   Encephalopathy acute   Hypovitaminosis   Tobacco use disorder Fever of unknown etiology  Discharge Condition: Fair  Diet recommendation: Heart healthy low-salt  Filed Weights   10/06/16 0557 10/07/16 2000  Weight: 79.4 kg (175 lb) 87.6 kg (193 lb 2 oz)    History of present illness:   60 y/o ?Past history of stage I chronic kidney disease, bipolar disorder with major depression and cocaine abuse with previous history of self-harm secondary to overdoses and ingestion presented to the emergency room on 11/7 after he reportedly taken 8-10 Seroquel 300 mg tablets. Initially confused on admission, patient became more belligerent and hostile. Heart 4-point restraints and move down to step down unit. Crit will care evaluated and felt discreetly related to alcohol or other withdrawal. Patient started on Precedex drip. After 72 hours, he was Darryl to be weaned off this and started on clonidine. Patient has vital breast high treatment and felt not to be a danger to himself. Discussed with social work set up an outpatient referral at [facility] rehabilitation facility here patient be discharged on 11/29 with follow-up on 11/30 with patient will take his referral paperwork  CK D: Stage I stable  Fever of unknown etiology: On night prior to discharge, patient had fever spike of temperature greater than 100. Follow-up urine, chest x-ray unrevealing. Her son stable. White blood cell count  normal. Patient observed during the day showed no signs of further infection. No further fever. Given that he was asymptomatic, felt to be medically stable for discharge   Discharge Exam: Vitals:   10/13/16 1200 10/13/16 1400  BP: (!) 156/96   Pulse:    Resp: (!) 29   Temp: 98.9 F (37.2 C) 98.6 F (37 C)    General: Alert and oriented 3, currently no acute distress Cardiovascular: Regular rate and rhythm, S1-S2 Respiratory: Clear to auscultation bilaterally  Discharge Instructions   Discharge Instructions    Diet - low sodium heart healthy    Complete by:  As directed    Increase activity slowly    Complete by:  As directed      Discharge Medication List as of 10/13/2016  2:58 PM    START taking these medications   Details  cloNIDine (CATAPRES - DOSED IN MG/24 HR) 0.2 mg/24hr patch Place 1 patch (0.2 mg total) onto the skin once a week., Starting Sat 10/16/2016, Normal    metoprolol tartrate (LOPRESSOR) 100 MG tablet Take 1 tablet (100 mg total) by mouth 2 (two) times daily., Starting Wed 10/13/2016, Normal      CONTINUE these medications which have NOT CHANGED   Details  amLODipine (NORVASC) 10 MG tablet Take 1 tablet (10 mg total) by mouth daily., Starting Tue 08/31/2016, Print    hydrochlorothiazide (MICROZIDE) 12.5 MG capsule Take 1 capsule (12.5 mg total) by mouth daily., Starting Tue 08/31/2016, Print    hydrOXYzine (ATARAX/VISTARIL) 25 MG tablet Take 1 tablet (25 mg total) by mouth every 6 (six) hours as needed for anxiety (or CIWA score </= 10)., Starting  Tue 08/31/2016, Normal    QUEtiapine (SEROQUEL) 300 MG tablet Take 1 tablet (300 mg total) by mouth at bedtime., Starting Tue 08/31/2016, Print    sertraline (ZOLOFT) 50 MG tablet Take 1 tablet (50 mg total) by mouth daily., Starting Wed 09/01/2016, Print    thiamine 100 MG tablet Take 1 tablet (100 mg total) by mouth daily., Starting Wed 09/01/2016, Normal      STOP taking these medications      benazepril (LOTENSIN) 10 MG tablet      methocarbamol (ROBAXIN) 500 MG tablet        Allergies  Allergen Reactions  . Other     Pt states he is allergic to a blood pressure pill. He is unable to state reaction. He is spelling it adelatt and aderlatt.      The results of significant diagnostics from this hospitalization (including imaging, microbiology, ancillary and laboratory) are listed below for reference.    Significant Diagnostic Studies: Dg Chest 2 View  Result Date: 10/13/2016 CLINICAL DATA:  Fever, left-sided chest discomfort, smoking history EXAM: CHEST  2 VIEW COMPARISON:  Chest x-ray of 08/08/2016 FINDINGS: No active infiltrate or effusion is seen. Mediastinal and hilar contours are unremarkable. The heart is within upper limits normal. No bony abnormality is seen. IMPRESSION: No active cardiopulmonary disease. Electronically Signed   By: Ivar Drape M.D.   On: 10/13/2016 12:20    Microbiology: Recent Results (from the past 240 hour(s))  MRSA PCR Screening     Status: Abnormal   Collection Time: 10/07/16  8:07 PM  Result Value Ref Range Status   MRSA by PCR POSITIVE (A) NEGATIVE Final    Comment:        The GeneXpert MRSA Assay (FDA approved for NASAL specimens only), is one component of a comprehensive MRSA colonization surveillance program. It is not intended to diagnose MRSA infection nor to guide or monitor treatment for MRSA infections. RESULT CALLED TO, READ BACK BY AND VERIFIED WITH: Denyse Amass RN @ [phone] ON 10/08/16 BY C DAVIS   Rapid strep screen (not at [facility])     Status: None   Collection Time: 10/12/16  3:20 PM  Result Value Ref Range Status   Streptococcus, Group A Screen (Direct) NEGATIVE NEGATIVE Final    Comment: (NOTE) A Rapid Antigen test may result negative if the antigen level in the sample is below the detection level of this test. The FDA has not cleared this test as a stand-alone test therefore the rapid antigen negative result has  reflexed to a Group A Strep culture.   Culture, group A strep     Status: None (Preliminary result)   Collection Time: 10/12/16  3:20 PM  Result Value Ref Range Status   Specimen Description THROAT  Final   Special Requests NONE  Final   Culture   Final    CULTURE REINCUBATED FOR BETTER GROWTH Performed at [facility]    Report Status PENDING  Incomplete     Labs: Basic Metabolic Panel:  Recent Labs Lab 10/06/16 1936 10/07/16 0448 10/09/16 0800 10/10/16 0346 10/12/16 0326 10/13/16 0357  NA 141 141 142 142 140  --   K 3.9 3.7 3.9 3.6 3.2*  --   CL 111 113* 115* 115* 115*  --   CO2 24 22 19* 17* 20*  --   GLUCOSE 97 93 100* 109* 116*  --   BUN 17 16 33* 28* 24*  --   CREATININE 1.75* 1.65* 1.94* 1.71*  1.88* 1.84*  CALCIUM 9.0 9.1 9.1 8.6* 8.6*  --   MG 2.3  --   --   --  1.9 2.0  PHOS 1.8*  --   --   --  3.7 2.8   Liver Function Tests:  Recent Labs Lab 10/06/16 1936 10/07/16 0448 10/09/16 0800 10/12/16 0326  AST 26 26 37 37  ALT 19 19 19 18   ALKPHOS 59 62 63 54  BILITOT 1.1 1.2 1.3* 1.1  PROT 7.3 7.0 7.1 6.1*  ALBUMIN 3.9 4.1 3.8 3.4*   No results for input(s): LIPASE, AMYLASE in the last 168 hours. No results for input(s): AMMONIA in the last 168 hours. CBC:  Recent Labs Lab 10/07/16 0448 10/09/16 0800 10/12/16 0326 10/13/16 0357  WBC 8.9 8.0 8.3 10.4  HGB 12.8* 13.9 11.4* 12.5*  HCT 39.0 42.2 34.4* 37.3*  MCV 84.1 84.9 83.3 82.7  PLT 245 219 182 244   Cardiac Enzymes:  Recent Labs Lab 10/07/16 0448  CKTOTAL 373   BNP: BNP (last 3 results) No results for input(s): BNP in the last 8760 hours.  ProBNP (last 3 results) No results for input(s): PROBNP in the last 8760 hours.  CBG: No results for input(s): GLUCAP in the last 168 hours.     Signed:  Annita Brod MD   [facility] 10/13/2016, 4:13 PM

## 2016-10-15 LAB — CULTURE, GROUP A STREP (THRC)

## 2017-03-08 ENCOUNTER — Inpatient Hospital Stay (HOSPITAL_COMMUNITY)
Admission: EM | Admit: 2017-03-08 | Discharge: 2017-03-20 | DRG: 040 | Disposition: A | Discharge status: Home Health | Payer: Medicaid Other | Attending: Internal Medicine | Admitting: Internal Medicine

## 2017-03-08 ENCOUNTER — Emergency Department (HOSPITAL_COMMUNITY): Payer: Medicaid Other

## 2017-03-08 DIAGNOSIS — Z95828 Presence of other vascular implants and grafts: Secondary | ICD-10-CM

## 2017-03-08 DIAGNOSIS — N184 Chronic kidney disease, stage 4 (severe): Secondary | ICD-10-CM | POA: Diagnosis present

## 2017-03-08 DIAGNOSIS — I82441 Acute embolism and thrombosis of right tibial vein: Secondary | ICD-10-CM | POA: Diagnosis present

## 2017-03-08 DIAGNOSIS — F251 Schizoaffective disorder, depressive type: Secondary | ICD-10-CM | POA: Diagnosis present

## 2017-03-08 DIAGNOSIS — I82491 Acute embolism and thrombosis of other specified deep vein of right lower extremity: Secondary | ICD-10-CM | POA: Diagnosis present

## 2017-03-08 DIAGNOSIS — Z789 Other specified health status: Secondary | ICD-10-CM

## 2017-03-08 DIAGNOSIS — G9340 Encephalopathy, unspecified: Secondary | ICD-10-CM | POA: Diagnosis not present

## 2017-03-08 DIAGNOSIS — E872 Acidosis: Secondary | ICD-10-CM | POA: Diagnosis not present

## 2017-03-08 DIAGNOSIS — I6350 Cerebral infarction due to unspecified occlusion or stenosis of unspecified cerebral artery: Secondary | ICD-10-CM | POA: Diagnosis present

## 2017-03-08 DIAGNOSIS — F329 Major depressive disorder, single episode, unspecified: Secondary | ICD-10-CM | POA: Diagnosis present

## 2017-03-08 DIAGNOSIS — R519 Headache, unspecified: Secondary | ICD-10-CM

## 2017-03-08 DIAGNOSIS — E876 Hypokalemia: Secondary | ICD-10-CM | POA: Diagnosis not present

## 2017-03-08 DIAGNOSIS — D72829 Elevated white blood cell count, unspecified: Secondary | ICD-10-CM | POA: Diagnosis not present

## 2017-03-08 DIAGNOSIS — Z781 Physical restraint status: Secondary | ICD-10-CM

## 2017-03-08 DIAGNOSIS — R51 Headache: Secondary | ICD-10-CM

## 2017-03-08 DIAGNOSIS — I639 Cerebral infarction, unspecified: Secondary | ICD-10-CM | POA: Diagnosis present

## 2017-03-08 DIAGNOSIS — I63542 Cerebral infarction due to unspecified occlusion or stenosis of left cerebellar artery: Secondary | ICD-10-CM

## 2017-03-08 DIAGNOSIS — I63432 Cerebral infarction due to embolism of left posterior cerebral artery: Principal | ICD-10-CM | POA: Diagnosis present

## 2017-03-08 DIAGNOSIS — R278 Other lack of coordination: Secondary | ICD-10-CM | POA: Diagnosis present

## 2017-03-08 DIAGNOSIS — I129 Hypertensive chronic kidney disease with stage 1 through stage 4 chronic kidney disease, or unspecified chronic kidney disease: Secondary | ICD-10-CM | POA: Diagnosis present

## 2017-03-08 DIAGNOSIS — R42 Dizziness and giddiness: Secondary | ICD-10-CM

## 2017-03-08 DIAGNOSIS — R402412 Glasgow coma scale score 13-15, at arrival to emergency department: Secondary | ICD-10-CM | POA: Diagnosis present

## 2017-03-08 DIAGNOSIS — Z8673 Personal history of transient ischemic attack (TIA), and cerebral infarction without residual deficits: Secondary | ICD-10-CM | POA: Diagnosis present

## 2017-03-08 DIAGNOSIS — G919 Hydrocephalus, unspecified: Secondary | ICD-10-CM | POA: Diagnosis present

## 2017-03-08 DIAGNOSIS — F17210 Nicotine dependence, cigarettes, uncomplicated: Secondary | ICD-10-CM | POA: Diagnosis present

## 2017-03-08 DIAGNOSIS — O2230 Deep phlebothrombosis in pregnancy, unspecified trimester: Secondary | ICD-10-CM

## 2017-03-08 DIAGNOSIS — G936 Cerebral edema: Secondary | ICD-10-CM | POA: Diagnosis present

## 2017-03-08 DIAGNOSIS — M545 Low back pain: Secondary | ICD-10-CM | POA: Diagnosis present

## 2017-03-08 DIAGNOSIS — Z9119 Patient's noncompliance with other medical treatment and regimen: Secondary | ICD-10-CM

## 2017-03-08 DIAGNOSIS — H5500 Unspecified nystagmus: Secondary | ICD-10-CM | POA: Diagnosis present

## 2017-03-08 DIAGNOSIS — E878 Other disorders of electrolyte and fluid balance, not elsewhere classified: Secondary | ICD-10-CM | POA: Diagnosis not present

## 2017-03-08 DIAGNOSIS — Z79899 Other long term (current) drug therapy: Secondary | ICD-10-CM

## 2017-03-08 DIAGNOSIS — F14188 Cocaine abuse with other cocaine-induced disorder: Secondary | ICD-10-CM | POA: Diagnosis present

## 2017-03-08 DIAGNOSIS — E785 Hyperlipidemia, unspecified: Secondary | ICD-10-CM | POA: Diagnosis present

## 2017-03-08 HISTORY — DX: Schizophrenia, unspecified: F20.9

## 2017-03-08 LAB — CBC WITH DIFFERENTIAL/PLATELET
Basophils Absolute: 0 K/uL (ref 0.0–0.1)
Basophils Relative: 0 %
Eosinophils Absolute: 0 K/uL (ref 0.0–0.7)
Eosinophils Relative: 0 %
HCT: 42.5 % (ref 39.0–52.0)
Hemoglobin: 14.4 g/dL (ref 13.0–17.0)
Lymphocytes Relative: 15 %
Lymphs Abs: 1.5 K/uL (ref 0.7–4.0)
MCH: 28 pg (ref 26.0–34.0)
MCHC: 33.9 g/dL (ref 30.0–36.0)
MCV: 82.7 fL (ref 78.0–100.0)
Monocytes Absolute: 0.4 K/uL (ref 0.1–1.0)
Monocytes Relative: 4 %
Neutro Abs: 8.5 K/uL — ABNORMAL HIGH (ref 1.7–7.7)
Neutrophils Relative %: 81 %
Platelets: 218 K/uL (ref 150–400)
RBC: 5.14 MIL/uL (ref 4.22–5.81)
RDW: 14.3 % (ref 11.5–15.5)
WBC: 10.5 K/uL (ref 4.0–10.5)

## 2017-03-08 LAB — COMPREHENSIVE METABOLIC PANEL
ALK PHOS: 72 U/L (ref 38–126)
ALT: 14 U/L — AB (ref 17–63)
AST: 30 U/L (ref 15–41)
Albumin: 4.1 g/dL (ref 3.5–5.0)
Anion gap: 10 (ref 5–15)
BILIRUBIN TOTAL: 0.7 mg/dL (ref 0.3–1.2)
BUN: 17 mg/dL (ref 6–20)
CALCIUM: 9 mg/dL (ref 8.9–10.3)
CO2: 23 mmol/L (ref 22–32)
CREATININE: 1.52 mg/dL — AB (ref 0.61–1.24)
Chloride: 105 mmol/L (ref 101–111)
GFR calc Af Amer: 56 mL/min — ABNORMAL LOW (ref >60)
GFR, EST NON AFRICAN AMERICAN: 48 mL/min — AB (ref >60)
Glucose, Bld: 104 mg/dL — ABNORMAL HIGH (ref 65–99)
Potassium: 3.5 mmol/L (ref 3.5–5.1)
Sodium: 138 mmol/L (ref 135–145)
Total Protein: 8 g/dL (ref 6.5–8.1)

## 2017-03-08 LAB — CBG MONITORING, ED: GLUCOSE-CAPILLARY: 108 mg/dL — AB (ref 65–99)

## 2017-03-08 MED ORDER — MECLIZINE HCL 25 MG PO TABS
25.0000 mg | ORAL_TABLET | Freq: Once | ORAL | Status: AC
  Administered 2017-03-08: 25 mg via ORAL
  Filled 2017-03-08: qty 1

## 2017-03-08 MED ORDER — KETOROLAC TROMETHAMINE 30 MG/ML IJ SOLN
30.0000 mg | Freq: Once | INTRAMUSCULAR | Status: AC
  Administered 2017-03-08: 30 mg via INTRAVENOUS
  Filled 2017-03-08: qty 1

## 2017-03-08 MED ORDER — DIAZEPAM 5 MG/ML IJ SOLN
5.0000 mg | Freq: Once | INTRAMUSCULAR | Status: AC
  Administered 2017-03-08: 5 mg via INTRAVENOUS
  Filled 2017-03-08: qty 2

## 2017-03-08 MED ORDER — SODIUM CHLORIDE 0.9 % IV BOLUS (SEPSIS)
1000.0000 mL | Freq: Once | INTRAVENOUS | Status: AC
  Administered 2017-03-08: 1000 mL via INTRAVENOUS

## 2017-03-08 MED ORDER — PROCHLORPERAZINE EDISYLATE 5 MG/ML IJ SOLN
10.0000 mg | Freq: Once | INTRAMUSCULAR | Status: AC
  Administered 2017-03-08: 10 mg via INTRAVENOUS
  Filled 2017-03-08: qty 2

## 2017-03-08 MED ORDER — METOPROLOL TARTRATE 50 MG PO TABS
100.0000 mg | ORAL_TABLET | Freq: Two times a day (BID) | ORAL | Status: DC
  Administered 2017-03-08: 100 mg via ORAL
  Filled 2017-03-08: qty 4

## 2017-03-08 MED ORDER — DIPHENHYDRAMINE HCL 50 MG/ML IJ SOLN
25.0000 mg | Freq: Once | INTRAMUSCULAR | Status: AC
  Administered 2017-03-08: 25 mg via INTRAVENOUS
  Filled 2017-03-08: qty 1

## 2017-03-08 MED ORDER — HYDROCHLOROTHIAZIDE 12.5 MG PO CAPS
12.5000 mg | ORAL_CAPSULE | Freq: Every day | ORAL | Status: DC
  Administered 2017-03-08: 12.5 mg via ORAL
  Filled 2017-03-08: qty 1

## 2017-03-08 MED ORDER — AMLODIPINE BESYLATE 10 MG PO TABS
10.0000 mg | ORAL_TABLET | Freq: Every day | ORAL | Status: DC
  Administered 2017-03-08: 10 mg via ORAL
  Filled 2017-03-08: qty 2

## 2017-03-08 NOTE — ED Notes (Signed)
Bed: YL69 Expected date:  Expected time:  Means of arrival:  Comments: EMS-vomiting/dizziness

## 2017-03-08 NOTE — ED Provider Notes (Signed)
[facility] DEPT Provider Note   CSN: [id number] Arrival date & time: 03/08/17  1653     History   Chief Complaint Chief Complaint  Patient presents with  . nausea and vomiting    HPI Darryl Parsons is a 61 y.o. male.  61 yo M with a chief complaint of dizziness nausea and vomiting. Going on for the past 24 hours. Patient woke up and felt extremely dizzy. Worse when he turns his head or moves his eyes. Also worse upon standing. Denies chest pain shortness breath denies injury. Complaining of some left-sided neck pain as well. Denies history of similar symptoms. Patient has been walking without difficulty at home.   The history is provided by the patient.  Illness  This is a new problem. The current episode started less than 1 hour ago. The problem occurs constantly. The problem has not changed since onset.Pertinent negatives include no chest pain, no abdominal pain, no headaches and no shortness of breath. Nothing aggravates the symptoms. Nothing relieves the symptoms. He has tried nothing for the symptoms. The treatment provided no relief.    Past Medical History:  Diagnosis Date  . Chronic kidney disease   . Headache   . Hypertension   . Low back pain   . Mental disorder     Patient Active Problem List   Diagnosis Date Noted  . Hypovitaminosis   . Tobacco use disorder   . Overdose 10/06/2016  . Schizoaffective disorder, depressive type ([place]) 08/27/2016  . Cocaine use disorder, mild, abuse 08/27/2016  . Essential hypertension 08/27/2016  . Chronic headache 08/27/2016  . Suicidal ideation 02/03/2016  . Encephalopathy acute 01/30/2016  . CKD (chronic kidney disease) stage 4, GFR 15-29 ml/min (HCC) 01/30/2016  . Benign essential HTN 01/30/2016  . Cocaine abuse 08/21/2012  . Hypokalemia 08/21/2012    No past surgical history on file.     Home Medications    Prior to Admission medications   Medication Sig Start Date End Date Taking? Authorizing Provider    amLODipine (NORVASC) 10 MG tablet Take 1 tablet (10 mg total) by mouth daily. 08/31/16  Yes Benjamine Mola, FNP  hydrochlorothiazide (MICROZIDE) 12.5 MG capsule Take 1 capsule (12.5 mg total) by mouth daily. 08/31/16  Yes Benjamine Mola, FNP  QUEtiapine (SEROQUEL) 300 MG tablet Take 1 tablet (300 mg total) by mouth at bedtime. 08/31/16  Yes Benjamine Mola, FNP  sertraline (ZOLOFT) 50 MG tablet Take 1 tablet (50 mg total) by mouth daily. 09/01/16  Yes Benjamine Mola, FNP  cloNIDine (CATAPRES - DOSED IN MG/24 HR) 0.2 mg/24hr patch Place 1 patch (0.2 mg total) onto the skin once a week. Patient not taking: Reported on 03/08/2017 10/16/16   Nita Sells, MD  hydrOXYzine (ATARAX/VISTARIL) 25 MG tablet Take 1 tablet (25 mg total) by mouth every 6 (six) hours as needed for anxiety (or CIWA score </= 10). 08/31/16   Benjamine Mola, FNP  metoprolol tartrate (LOPRESSOR) 100 MG tablet Take 1 tablet (100 mg total) by mouth 2 (two) times daily. Patient not taking: Reported on 03/08/2017 10/13/16   Annita Brod, MD  thiamine 100 MG tablet Take 1 tablet (100 mg total) by mouth daily. Patient not taking: Reported on 03/08/2017 09/01/16   Benjamine Mola, FNP    Family History Family History  Problem Relation Age of Onset  . Cardiomyopathy Sister   . Lupus Sister   . Kidney failure Sister   . Factor VIII deficiency Neg Hx   .  Mental illness Neg Hx     Social History Social History  Substance Use Topics  . Smoking status: Current Every Day Smoker    Packs/day: 0.50    Types: Cigarettes  . Smokeless tobacco: Never Used  . Alcohol use Yes     Comment: 1-2 beers a month     Allergies   Other   Review of Systems Review of Systems  Constitutional: Negative for chills and fever.  HENT: Negative for congestion and facial swelling.   Eyes: Negative for discharge and visual disturbance.  Respiratory: Negative for shortness of breath.   Cardiovascular: Negative for chest pain and  palpitations.  Gastrointestinal: Positive for nausea and vomiting. Negative for abdominal pain and diarrhea.  Musculoskeletal: Negative for arthralgias and myalgias.  Skin: Negative for color change and rash.  Neurological: Positive for dizziness. Negative for tremors, syncope and headaches.  Psychiatric/Behavioral: Negative for confusion and dysphoric mood.     Physical Exam Updated Vital Signs BP (!) 166/98 (BP Location: Right Arm)   Pulse 85   Temp 97.5 F (36.4 C)   Resp 20   Ht 5\' 8"  (1.727 m)   Wt 192 lb (87.1 kg)   SpO2 98%   BMI 29.19 kg/m   Physical Exam  Constitutional: He is oriented to person, place, and time. He appears well-developed and well-nourished.  HENT:  Head: Normocephalic and atraumatic.  Eyes: EOM are normal. Pupils are equal, round, and reactive to light.  Neck: Normal range of motion. Neck supple. No JVD present.  Cardiovascular: Normal rate and regular rhythm.  Exam reveals no gallop and no friction rub.   No murmur heard. Pulmonary/Chest: No respiratory distress. He has no wheezes.  Abdominal: He exhibits no distension and no mass. There is no tenderness. There is no rebound and no guarding.  Musculoskeletal: Normal range of motion.  Neurological: He is alert and oriented to person, place, and time. He has normal strength. No cranial nerve deficit or sensory deficit. GCS eye subscore is 4. GCS verbal subscore is 5. GCS motor subscore is 6. He displays no Babinski's sign on the right side. He displays no Babinski's sign on the left side.  L sided fast going nystamus, reproduces dizziness  Skin: No rash noted. No pallor.  Psychiatric: He has a normal mood and affect. His behavior is normal.  Nursing note and vitals reviewed.    ED Treatments / Results  Labs (all labs ordered are listed, but only abnormal results are displayed) Labs Reviewed  CBC WITH DIFFERENTIAL/PLATELET - Abnormal; Notable for the following:       Result Value   Neutro Abs  8.5 (*)    All other components within normal limits  COMPREHENSIVE METABOLIC PANEL - Abnormal; Notable for the following:    Glucose, Bld 104 (*)    Creatinine, Ser 1.52 (*)    ALT 14 (*)    GFR calc non Af Amer 48 (*)    GFR calc Af Amer 56 (*)    All other components within normal limits  CBG MONITORING, ED - Abnormal; Notable for the following:    Glucose-Capillary 108 (*)    All other components within normal limits  CBC WITH DIFFERENTIAL/PLATELET    EKG  EKG Interpretation  Date/Time:  Tuesday March 08 2017 21:08:12 EDT Ventricular Rate:  90 PR Interval:  210 QRS Duration: 98 QT Interval:  380 QTC Calculation: 464 R Axis:   -57 Text Interpretation:  Sinus rhythm with 1st degree A-V block Possible  Left atrial enlargement Left anterior fascicular block Abnormal ECG No significant change since last tracing Confirmed by Jaquane Boughner MD, Quillian Quince [phone]) on 03/08/2017 9:42:52 PM       Radiology [name]  Result Date: 03/08/2017 CLINICAL DATA:  Vertigo EXAM: MRI HEAD WITHOUT CONTRAST TECHNIQUE: Multiplanar, multiecho pulse sequences of the brain and surrounding structures were obtained without intravenous contrast. COMPARISON:  Head CT 08/27/2016 FINDINGS: The examination had to be discontinued prior to completion due to patient refusal to continue. Brain: There is a large area of diffusion restriction within the left cerebellar hemisphere, in the PICA territory, with associated cytotoxic edema and rightward displacement of the midline posterior fossa structures. There is also a small focus of diffusion restriction within the left occipital lobe. No acute hemorrhage. The basal cisterns, cerebral aqueduct and fourth ventricle remain patent. No hydrocephalus or extra-axial fluid collection. The midline structures are normal. No age advanced or lobar predominant atrophy. There is multifocal hyperintense T2-weighted signal within the periventricular white matter, most often seen in the  setting of chronic microvascular ischemia. Vascular: Major intracranial arterial and venous sinus flow voids are preserved. There are scattered foci of chronic microhemorrhage within both cerebral hemispheres, the brainstem and cerebellum. The distribution is nonspecific. Skull and upper cervical spine: The visualized skull base, calvarium, upper cervical spine and extracranial soft tissues are normal. Sinuses/Orbits: No fluid levels or advanced mucosal thickening. No mastoid effusion. Normal orbits. IMPRESSION: 1. Truncated examination due to patient refusal to complete all sequences. 2. 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 The basal cisterns and fourth ventricle remain patent without obstructive hydrocephalus. No acute hemorrhage. 3. Small left occipital acute infarct. 4. Chronic microvascular ischemia and scattered punctate foci of chronic microhemorrhage. These results will be called to the ordering clinician or representative by the Radiologist Assistant, and communication documented in the PACS or zVision Dashboard. Electronically Signed   By: Ulyses Jarred M.D.   On: 03/08/2017 23:25    Procedures Procedures (including critical care time)  Medications Ordered in ED Medications  amLODipine (NORVASC) tablet 10 mg (10 mg Oral Given 03/08/17 2123)  hydrochlorothiazide (MICROZIDE) capsule 12.5 mg (12.5 mg Oral Given 03/08/17 2123)  metoprolol tartrate (LOPRESSOR) tablet 100 mg (100 mg Oral Given 03/08/17 2123)  HYDROmorphone (DILAUDID) injection 1 mg (not administered)  prochlorperazine (COMPAZINE) injection 10 mg (10 mg Intravenous Given 03/08/17 1852)  diphenhydrAMINE (BENADRYL) injection 25 mg (25 mg Intravenous Given 03/08/17 1852)  sodium chloride 0.9 % bolus 1,000 mL (0 mLs Intravenous Stopped 03/08/17 2022)  ketorolac (TORADOL) 30 MG/ML injection 30 mg (30 mg Intravenous Given 03/08/17 1852)    meclizine (ANTIVERT) tablet 25 mg (25 mg Oral Given 03/08/17 2022)  diazepam (VALIUM) injection 5 mg (5 mg Intravenous Given 03/08/17 2021)     Initial Impression / Assessment and Plan / ED Course  I have reviewed the triage vital signs and the nursing notes.  Pertinent labs & imaging results that were available during my care of the patient were reviewed by me and considered in my medical decision making (see chart for details).     61 yo M With a chief complaint of dizziness. Patient symptoms are reproducible with left-sided heart gaze. His left-sided fast going nystagmus. Will treat the patient with a headache cocktail reassess.  Patient with continued symptoms having trouble walking.  Given valium, meclizine without improvement.  Will obtain labs, MR to eval for posterior circulation  stroke.   MR with posterior circulation stroke with cytogenic edema.  Will admit.   CRITICAL CARE Performed by: Cecilio Asper   Total critical care time: 35 minutes  Critical care time was exclusive of separately billable procedures and treating other patients.  Critical care was necessary to treat or prevent imminent or life-threatening deterioration.  Critical care was time spent personally by me on the following activities: development of treatment plan with patient and/or surrogate as well as nursing, discussions with consultants, evaluation of patient's response to treatment, examination of patient, obtaining history from patient or surrogate, ordering and performing treatments and interventions, ordering and review of laboratory studies, ordering and review of radiographic studies, pulse oximetry and re-evaluation of patient's condition.  The patients results and plan were reviewed and discussed.   Any x-rays performed were independently reviewed by myself.   Differential diagnosis were considered with the presenting HPI.  Medications  amLODipine (NORVASC) tablet 10 mg (10 mg Oral  Given 03/08/17 2123)  hydrochlorothiazide (MICROZIDE) capsule 12.5 mg (12.5 mg Oral Given 03/08/17 2123)  metoprolol tartrate (LOPRESSOR) tablet 100 mg (100 mg Oral Given 03/08/17 2123)  HYDROmorphone (DILAUDID) injection 1 mg (not administered)  prochlorperazine (COMPAZINE) injection 10 mg (10 mg Intravenous Given 03/08/17 1852)  diphenhydrAMINE (BENADRYL) injection 25 mg (25 mg Intravenous Given 03/08/17 1852)  sodium chloride 0.9 % bolus 1,000 mL (0 mLs Intravenous Stopped 03/08/17 2022)  ketorolac (TORADOL) 30 MG/ML injection 30 mg (30 mg Intravenous Given 03/08/17 1852)  meclizine (ANTIVERT) tablet 25 mg (25 mg Oral Given 03/08/17 2022)  diazepam (VALIUM) injection 5 mg (5 mg Intravenous Given 03/08/17 2021)    Vitals:   03/08/17 2000 03/08/17 2032 03/08/17 2100 03/09/17 0004  BP: (!) 168/112 (!) 179/118 (!) 178/107 (!) 166/98  Pulse:  95 99 85  Resp:  (!) 24  20  Temp:      SpO2:  97% 97% 98%  Weight:      Height:        Final diagnoses:  Vertigo    Admission/ observation were discussed with the admitting physician, patient and/or family and they are comfortable with the plan.   Final Clinical Impressions(s) / ED Diagnoses   Final diagnoses:  Vertigo    New Prescriptions New Prescriptions   No medications on file     Deno Etienne, DO 03/09/17 [id number]

## 2017-03-08 NOTE — ED Triage Notes (Signed)
Pt coming from home with c/o  Headache, dizziness for 1 day and also nausea and vomiting. Pt denies chest pain. Neuro intact.

## 2017-03-08 NOTE — ED Notes (Signed)
Patient transported to MRI 

## 2017-03-09 ENCOUNTER — Inpatient Hospital Stay (HOSPITAL_COMMUNITY): Payer: Medicaid Other

## 2017-03-09 DIAGNOSIS — R278 Other lack of coordination: Secondary | ICD-10-CM | POA: Diagnosis present

## 2017-03-09 DIAGNOSIS — I639 Cerebral infarction, unspecified: Secondary | ICD-10-CM | POA: Diagnosis not present

## 2017-03-09 DIAGNOSIS — F1120 Opioid dependence, uncomplicated: Secondary | ICD-10-CM | POA: Diagnosis not present

## 2017-03-09 DIAGNOSIS — R410 Disorientation, unspecified: Secondary | ICD-10-CM | POA: Diagnosis not present

## 2017-03-09 DIAGNOSIS — I82491 Acute embolism and thrombosis of other specified deep vein of right lower extremity: Secondary | ICD-10-CM | POA: Diagnosis present

## 2017-03-09 DIAGNOSIS — E876 Hypokalemia: Secondary | ICD-10-CM | POA: Diagnosis not present

## 2017-03-09 DIAGNOSIS — G9340 Encephalopathy, unspecified: Secondary | ICD-10-CM | POA: Diagnosis not present

## 2017-03-09 DIAGNOSIS — Z8673 Personal history of transient ischemic attack (TIA), and cerebral infarction without residual deficits: Secondary | ICD-10-CM | POA: Diagnosis present

## 2017-03-09 DIAGNOSIS — I6350 Cerebral infarction due to unspecified occlusion or stenosis of unspecified cerebral artery: Secondary | ICD-10-CM | POA: Diagnosis present

## 2017-03-09 DIAGNOSIS — F1490 Cocaine use, unspecified, uncomplicated: Secondary | ICD-10-CM | POA: Diagnosis not present

## 2017-03-09 DIAGNOSIS — E878 Other disorders of electrolyte and fluid balance, not elsewhere classified: Secondary | ICD-10-CM | POA: Diagnosis not present

## 2017-03-09 DIAGNOSIS — F17200 Nicotine dependence, unspecified, uncomplicated: Secondary | ICD-10-CM

## 2017-03-09 DIAGNOSIS — R402412 Glasgow coma scale score 13-15, at arrival to emergency department: Secondary | ICD-10-CM | POA: Diagnosis present

## 2017-03-09 DIAGNOSIS — Z781 Physical restraint status: Secondary | ICD-10-CM | POA: Diagnosis not present

## 2017-03-09 DIAGNOSIS — G936 Cerebral edema: Secondary | ICD-10-CM | POA: Diagnosis present

## 2017-03-09 DIAGNOSIS — F1410 Cocaine abuse, uncomplicated: Secondary | ICD-10-CM

## 2017-03-09 DIAGNOSIS — M545 Low back pain: Secondary | ICD-10-CM | POA: Diagnosis present

## 2017-03-09 DIAGNOSIS — I82441 Acute embolism and thrombosis of right tibial vein: Secondary | ICD-10-CM | POA: Diagnosis present

## 2017-03-09 DIAGNOSIS — F14188 Cocaine abuse with other cocaine-induced disorder: Secondary | ICD-10-CM | POA: Diagnosis present

## 2017-03-09 DIAGNOSIS — G919 Hydrocephalus, unspecified: Secondary | ICD-10-CM | POA: Diagnosis present

## 2017-03-09 DIAGNOSIS — F329 Major depressive disorder, single episode, unspecified: Secondary | ICD-10-CM | POA: Diagnosis present

## 2017-03-09 DIAGNOSIS — N184 Chronic kidney disease, stage 4 (severe): Secondary | ICD-10-CM | POA: Diagnosis present

## 2017-03-09 DIAGNOSIS — E785 Hyperlipidemia, unspecified: Secondary | ICD-10-CM | POA: Diagnosis present

## 2017-03-09 DIAGNOSIS — I63542 Cerebral infarction due to unspecified occlusion or stenosis of left cerebellar artery: Secondary | ICD-10-CM | POA: Diagnosis not present

## 2017-03-09 DIAGNOSIS — I63432 Cerebral infarction due to embolism of left posterior cerebral artery: Secondary | ICD-10-CM | POA: Diagnosis present

## 2017-03-09 DIAGNOSIS — I6789 Other cerebrovascular disease: Secondary | ICD-10-CM | POA: Diagnosis not present

## 2017-03-09 DIAGNOSIS — F1290 Cannabis use, unspecified, uncomplicated: Secondary | ICD-10-CM | POA: Diagnosis not present

## 2017-03-09 DIAGNOSIS — F10239 Alcohol dependence with withdrawal, unspecified: Secondary | ICD-10-CM | POA: Diagnosis not present

## 2017-03-09 DIAGNOSIS — R42 Dizziness and giddiness: Secondary | ICD-10-CM | POA: Diagnosis present

## 2017-03-09 DIAGNOSIS — F6089 Other specific personality disorders: Secondary | ICD-10-CM | POA: Diagnosis not present

## 2017-03-09 DIAGNOSIS — E872 Acidosis: Secondary | ICD-10-CM | POA: Diagnosis not present

## 2017-03-09 DIAGNOSIS — I10 Essential (primary) hypertension: Secondary | ICD-10-CM | POA: Diagnosis not present

## 2017-03-09 DIAGNOSIS — I129 Hypertensive chronic kidney disease with stage 1 through stage 4 chronic kidney disease, or unspecified chronic kidney disease: Secondary | ICD-10-CM | POA: Diagnosis present

## 2017-03-09 DIAGNOSIS — D72829 Elevated white blood cell count, unspecified: Secondary | ICD-10-CM | POA: Diagnosis not present

## 2017-03-09 DIAGNOSIS — I82409 Acute embolism and thrombosis of unspecified deep veins of unspecified lower extremity: Secondary | ICD-10-CM | POA: Diagnosis not present

## 2017-03-09 DIAGNOSIS — I63342 Cerebral infarction due to thrombosis of left cerebellar artery: Secondary | ICD-10-CM

## 2017-03-09 DIAGNOSIS — F17210 Nicotine dependence, cigarettes, uncomplicated: Secondary | ICD-10-CM | POA: Diagnosis present

## 2017-03-09 DIAGNOSIS — F251 Schizoaffective disorder, depressive type: Secondary | ICD-10-CM | POA: Diagnosis present

## 2017-03-09 DIAGNOSIS — H5500 Unspecified nystagmus: Secondary | ICD-10-CM | POA: Diagnosis present

## 2017-03-09 DIAGNOSIS — F1420 Cocaine dependence, uncomplicated: Secondary | ICD-10-CM | POA: Diagnosis not present

## 2017-03-09 HISTORY — DX: Cerebral infarction, unspecified: I63.9

## 2017-03-09 LAB — RAPID URINE DRUG SCREEN, HOSP PERFORMED
AMPHETAMINES: NOT DETECTED
BARBITURATES: NOT DETECTED
BENZODIAZEPINES: POSITIVE — AB
Cocaine: POSITIVE — AB
Opiates: POSITIVE — AB
Tetrahydrocannabinol: NOT DETECTED

## 2017-03-09 LAB — LIPID PANEL
CHOL/HDL RATIO: 3.1 ratio
Cholesterol: 226 mg/dL — ABNORMAL HIGH (ref 0–200)
HDL: 74 mg/dL (ref >40)
LDL CALC: 135 mg/dL — AB (ref 0–99)
Triglycerides: 85 mg/dL (ref <150)
VLDL: 17 mg/dL (ref 0–40)

## 2017-03-09 LAB — CBC
HCT: 42.5 % (ref 39.0–52.0)
HEMOGLOBIN: 14.3 g/dL (ref 13.0–17.0)
MCH: 27.8 pg (ref 26.0–34.0)
MCHC: 33.6 g/dL (ref 30.0–36.0)
MCV: 82.7 fL (ref 78.0–100.0)
PLATELETS: 240 10*3/uL (ref 150–400)
RBC: 5.14 MIL/uL (ref 4.22–5.81)
RDW: 14.3 % (ref 11.5–15.5)
WBC: 11 10*3/uL — AB (ref 4.0–10.5)

## 2017-03-09 LAB — HIV ANTIBODY (ROUTINE TESTING W REFLEX): HIV Screen 4th Generation wRfx: NONREACTIVE

## 2017-03-09 LAB — CREATININE, SERUM
Creatinine, Ser: 1.54 mg/dL — ABNORMAL HIGH (ref 0.61–1.24)
GFR calc Af Amer: 55 mL/min — ABNORMAL LOW (ref >60)
GFR, EST NON AFRICAN AMERICAN: 48 mL/min — AB (ref >60)

## 2017-03-09 LAB — MRSA PCR SCREENING: MRSA by PCR: NEGATIVE

## 2017-03-09 LAB — SODIUM: SODIUM: 136 mmol/L (ref 135–145)

## 2017-03-09 MED ORDER — LABETALOL HCL 5 MG/ML IV SOLN: 10.0000 mg | INTRAVENOUS | Status: DC | PRN

## 2017-03-09 MED ORDER — ASPIRIN EC 325 MG PO TBEC
325.0000 mg | DELAYED_RELEASE_TABLET | Freq: Every day | ORAL | Status: DC
  Administered 2017-03-10 – 2017-03-16 (×6): 325 mg via ORAL
  Filled 2017-03-09 (×9): qty 1

## 2017-03-09 MED ORDER — SODIUM CHLORIDE 0.9 % IV SOLN
INTRAVENOUS | Status: DC
  Administered 2017-03-09 (×2): via INTRAVENOUS

## 2017-03-09 MED ORDER — ACETAMINOPHEN 325 MG PO TABS
650.0000 mg | ORAL_TABLET | ORAL | Status: DC | PRN
  Administered 2017-03-10 – 2017-03-15 (×5): 650 mg via ORAL
  Filled 2017-03-09 (×5): qty 2

## 2017-03-09 MED ORDER — ENOXAPARIN SODIUM 40 MG/0.4ML ~~LOC~~ SOLN
40.0000 mg | SUBCUTANEOUS | Status: DC
  Administered 2017-03-09: 40 mg via SUBCUTANEOUS
  Filled 2017-03-09: qty 0.4

## 2017-03-09 MED ORDER — HYDROXYZINE HCL 25 MG PO TABS
25.0000 mg | ORAL_TABLET | Freq: Four times a day (QID) | ORAL | Status: DC | PRN
  Administered 2017-03-10 – 2017-03-16 (×3): 25 mg via ORAL
  Filled 2017-03-09 (×3): qty 1

## 2017-03-09 MED ORDER — SODIUM CHLORIDE 3 % IV SOLN
INTRAVENOUS | Status: DC
  Administered 2017-03-09 – 2017-03-10 (×2): 50 mL/h via INTRAVENOUS
  Administered 2017-03-10 – 2017-03-13 (×10): 75 mL/h via INTRAVENOUS
  Filled 2017-03-09 (×15): qty 500

## 2017-03-09 MED ORDER — OXYCODONE HCL 5 MG PO TABS
5.0000 mg | ORAL_TABLET | ORAL | Status: DC | PRN
  Administered 2017-03-09: 5 mg via ORAL
  Filled 2017-03-09: qty 1

## 2017-03-09 MED ORDER — CLEVIDIPINE BUTYRATE 0.5 MG/ML IV EMUL: 0.0000 mg/h | INTRAVENOUS | Status: DC

## 2017-03-09 MED ORDER — FOLIC ACID 5 MG/ML IJ SOLN
1.0000 mg | Freq: Every day | INTRAMUSCULAR | Status: DC
  Administered 2017-03-09 – 2017-03-16 (×7): 1 mg via INTRAVENOUS
  Filled 2017-03-09 (×8): qty 0.2

## 2017-03-09 MED ORDER — ACETAMINOPHEN 10 MG/ML IV SOLN
1000.0000 mg | Freq: Three times a day (TID) | INTRAVENOUS | Status: DC | PRN
  Administered 2017-03-09 (×2): 1000 mg via INTRAVENOUS
  Filled 2017-03-09 (×2): qty 100

## 2017-03-09 MED ORDER — PROMETHAZINE HCL 25 MG/ML IJ SOLN
25.0000 mg | Freq: Four times a day (QID) | INTRAMUSCULAR | Status: DC | PRN
  Administered 2017-03-09: 25 mg via INTRAVENOUS
  Filled 2017-03-09: qty 1

## 2017-03-09 MED ORDER — BUTALBITAL-APAP-CAFFEINE 50-325-40 MG PO TABS
2.0000 | ORAL_TABLET | Freq: Three times a day (TID) | ORAL | Status: DC | PRN
  Administered 2017-03-09 – 2017-03-13 (×6): 2 via ORAL
  Administered 2017-03-14: 1 via ORAL
  Administered 2017-03-15: 2 via ORAL
  Administered 2017-03-15 (×2): 1 via ORAL
  Administered 2017-03-16 (×2): 2 via ORAL
  Filled 2017-03-09 (×3): qty 2
  Filled 2017-03-09: qty 1
  Filled 2017-03-09 (×2): qty 2
  Filled 2017-03-09: qty 1
  Filled 2017-03-09 (×5): qty 2

## 2017-03-09 MED ORDER — VITAMIN B-1 100 MG PO TABS
100.0000 mg | ORAL_TABLET | Freq: Every day | ORAL | Status: DC
  Administered 2017-03-10 – 2017-03-20 (×6): 100 mg via ORAL
  Filled 2017-03-09 (×7): qty 1

## 2017-03-09 MED ORDER — QUETIAPINE FUMARATE 200 MG PO TABS: 300.0000 mg | ORAL_TABLET | Freq: Every day | ORAL | Status: DC

## 2017-03-09 MED ORDER — ATORVASTATIN CALCIUM 40 MG PO TABS
40.0000 mg | ORAL_TABLET | Freq: Every day | ORAL | Status: DC
  Administered 2017-03-09 – 2017-03-16 (×7): 40 mg via ORAL
  Filled 2017-03-09 (×8): qty 1

## 2017-03-09 MED ORDER — HYDROMORPHONE HCL 1 MG/ML IJ SOLN
1.0000 mg | Freq: Once | INTRAMUSCULAR | Status: AC
  Administered 2017-03-09: 1 mg via INTRAVENOUS
  Filled 2017-03-09: qty 1

## 2017-03-09 MED ORDER — HYDRALAZINE HCL 20 MG/ML IJ SOLN
10.0000 mg | INTRAMUSCULAR | Status: DC | PRN
  Administered 2017-03-09 – 2017-03-16 (×8): 20 mg via INTRAVENOUS
  Administered 2017-03-17: 10 mg via INTRAVENOUS
  Filled 2017-03-09 (×9): qty 1

## 2017-03-09 MED ORDER — ONDANSETRON HCL 4 MG/2ML IJ SOLN
4.0000 mg | Freq: Four times a day (QID) | INTRAMUSCULAR | Status: DC | PRN
  Administered 2017-03-09 – 2017-03-18 (×2): 4 mg via INTRAVENOUS
  Filled 2017-03-09 (×2): qty 2

## 2017-03-09 MED ORDER — STROKE: EARLY STAGES OF RECOVERY BOOK
Freq: Once | Status: AC
  Administered 2017-03-09: 04:00:00
  Filled 2017-03-09: qty 1

## 2017-03-09 MED ORDER — ACETAMINOPHEN 160 MG/5ML PO SOLN: 650.0000 mg | ORAL | Status: DC | PRN

## 2017-03-09 MED ORDER — SERTRALINE HCL 50 MG PO TABS
50.0000 mg | ORAL_TABLET | Freq: Every day | ORAL | Status: DC
  Administered 2017-03-11 – 2017-03-20 (×7): 50 mg via ORAL
  Filled 2017-03-09 (×11): qty 1

## 2017-03-09 MED ORDER — THIAMINE HCL 100 MG/ML IJ SOLN
100.0000 mg | Freq: Every day | INTRAMUSCULAR | Status: DC
  Administered 2017-03-09 – 2017-03-13 (×3): 100 mg via INTRAVENOUS
  Filled 2017-03-09 (×4): qty 2

## 2017-03-09 MED ORDER — ADULT MULTIVITAMIN W/MINERALS CH
1.0000 | ORAL_TABLET | Freq: Every day | ORAL | Status: DC
  Administered 2017-03-10 – 2017-03-20 (×5): 1 via ORAL
  Filled 2017-03-09 (×10): qty 1

## 2017-03-09 MED ORDER — ACETAMINOPHEN 650 MG RE SUPP: 650.0000 mg | RECTAL | Status: DC | PRN

## 2017-03-09 MED ORDER — LORAZEPAM 2 MG/ML IJ SOLN
1.0000 mg | Freq: Four times a day (QID) | INTRAMUSCULAR | Status: AC | PRN
  Administered 2017-03-10 – 2017-03-13 (×7): 1 mg via INTRAVENOUS
  Filled 2017-03-09 (×9): qty 1

## 2017-03-09 MED ORDER — FENTANYL CITRATE (PF) 100 MCG/2ML IJ SOLN
12.5000 ug | Freq: Once | INTRAMUSCULAR | Status: AC
  Administered 2017-03-09: 12.5 ug via INTRAVENOUS
  Filled 2017-03-09: qty 2

## 2017-03-09 MED ORDER — SENNOSIDES-DOCUSATE SODIUM 8.6-50 MG PO TABS: 1.0000 | ORAL_TABLET | Freq: Every evening | ORAL | Status: DC | PRN

## 2017-03-09 MED ORDER — LORAZEPAM 1 MG PO TABS: 1.0000 mg | ORAL_TABLET | Freq: Four times a day (QID) | ORAL | Status: AC | PRN

## 2017-03-09 MED ORDER — ASPIRIN EC 81 MG PO TBEC
81.0000 mg | DELAYED_RELEASE_TABLET | Freq: Every day | ORAL | Status: DC
  Administered 2017-03-09: 81 mg via ORAL
  Filled 2017-03-09 (×2): qty 1

## 2017-03-09 NOTE — Progress Notes (Addendum)
Lower ext. results called to Dr Erlinda Hong. SCDS removed. Monitoring closely. Shooter Tangen, Rande Brunt, RN

## 2017-03-09 NOTE — Progress Notes (Signed)
Preliminary results by tech -Lower Ext. Venous duplex Completed. Positive for acute deep vein thrombosis involving the right peroneal veins and a short segment of the right posterior tibial veins at the proximal and mid level, and thrombus in the proximal muscular calf gastro veins.  Results given to Clarise Cruz, patient's nurse.  Oda Cogan, BS, RDMS, RVT

## 2017-03-09 NOTE — Evaluation (Signed)
Occupational Therapy Evaluation Patient Details Name: Darryl Parsons MRN: [id number] DOB: April 27, 1956 Today's Date: 03/09/2017    History of Present Illness 61 y.o. male who presented to the ED with severe headache, dizziness, nausea and vomiting. 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   Clinical Impression   Pt reports he was independent with ADL PTA. Currently pt min guard for stand pivot transfer and UB ADL in sitting; min assist for LB ADL. Pt presenting with impulsivity, dizziness, pain, horizontal nystagmus with tracking toward L side, and impaired balance impacting his independence and safety with ADL and functional mobility. Recommending CIR level therapies to maximize independence and safety with ADL and functional mobility prior to return home. Pt would benefit from continued skilled OT to address established goals.    Follow Up Recommendations  CIR;Supervision/Assistance - 24 hour    Equipment Recommendations  Other (comment) (TBD)    Recommendations for Other Services Rehab consult     Precautions / Restrictions Precautions Precautions: Fall Restrictions Weight Bearing Restrictions: No      Mobility Bed Mobility Overal bed mobility: Needs Assistance Bed Mobility: Supine to Sit     Supine to sit: Min guard     General bed mobility comments: Min guard for safety. Pt c/o dizziness with positional changes.  Transfers Overall transfer level: Needs assistance Equipment used: None Transfers: Stand Pivot Transfers   Stand pivot transfers: Min guard       General transfer comment: For safety; pt impulsive with transfer.    Balance Overall balance assessment: Needs assistance Sitting-balance support: Feet supported;Bilateral upper extremity supported Sitting balance-Leahy Scale: Fair     Standing balance support: Bilateral upper extremity supported Standing balance-Leahy Scale: Poor Standing balance  comment: holding onto bed rail and arm rail of chair to transfer                           ADL either performed or assessed with clinical judgement   ADL Overall ADL's : Needs assistance/impaired     Grooming: Min guard;Sitting   Upper Body Bathing: Min guard;Sitting   Lower Body Bathing: Minimal assistance;Sit to/from stand   Upper Body Dressing : Min guard;Sitting   Lower Body Dressing: Minimal assistance;Sit to/from stand   Toilet Transfer: Psychologist, sport and exercise Details (indicate cue type and reason): Simulated by stand pivot to chair         Functional mobility during ADLs: Min guard (for stand pivot only) General ADL Comments: Dizziness with positional changes     Vision Baseline Vision/History: Wears glasses Wears Glasses: Reading only Patient Visual Report: Blurring of vision Vision Assessment?: Yes Additional Comments: L eye squinting, pt reports blurred vision in near and distance vision. Horizontal nystagmus with tracking toward L. Needs further assessment-limited due to headache and pt tolerance.     Perception     Praxis      Pertinent Vitals/Pain Pain Assessment: 0-10 Pain Score: 10-Worst pain ever Pain Location: headache Pain Descriptors / Indicators: Aching;Headache Pain Intervention(s): Monitored during session;Limited activity within patient's tolerance;Repositioned     Hand Dominance Right   Extremity/Trunk Assessment Upper Extremity Assessment Upper Extremity Assessment: Overall WFL for tasks assessed   Lower Extremity Assessment Lower Extremity Assessment: Defer to PT evaluation   Cervical / Trunk Assessment Cervical / Trunk Assessment: Normal   Communication Communication Communication: No difficulties   Cognition Arousal/Alertness: Awake/alert Behavior During Therapy: Impulsive;Flat affect  Overall Cognitive Status: No family/caregiver present to determine baseline cognitive functioning                                      General Comments       Exercises     Shoulder Instructions      Home Living   Living Arrangements: Spouse/significant other (common law wife "[name]") Available Help at Discharge: Family               Bathroom Shower/Tub: Walk-in Psychologist, prison and probation services: Standard     Home Equipment: Cane - single point          Prior Functioning/Environment Level of Independence: Independent                 OT Problem List: Decreased activity tolerance;Impaired balance (sitting and/or standing);Impaired vision/perception;Decreased coordination;Decreased safety awareness;Pain      OT Treatment/Interventions: Self-care/ADL training;Neuromuscular education;Energy conservation;DME and/or AE instruction;Therapeutic exercise;Cognitive remediation/compensation;Visual/perceptual remediation/compensation;Patient/family education;Balance training    OT Goals(Current goals can be found in the care plan section) Acute Rehab OT Goals Patient Stated Goal: decrease pain OT Goal Formulation: With patient Time For Goal Achievement: 03/23/17 Potential to Achieve Goals: Good ADL Goals Pt Will Perform Grooming: with modified independence;standing Pt Will Perform Upper Body Bathing: with modified independence;sitting Pt Will Perform Lower Body Bathing: with modified independence;sit to/from stand Pt Will Perform Upper Body Dressing: with modified independence;sitting Pt Will Perform Lower Body Dressing: with modified independence;sit to/from stand Pt Will Transfer to Toilet: with modified independence;ambulating;regular height toilet Pt Will Perform Toileting - Clothing Manipulation and hygiene: with modified independence;sit to/from stand  OT Frequency: Min 3X/week   Barriers to D/C:            Co-evaluation PT/OT/SLP Co-Evaluation/Treatment: Yes Reason for Co-Treatment: For patient/therapist safety   OT goals addressed during session: ADL's and  self-care      End of Session    Activity Tolerance: Patient limited by pain;Patient tolerated treatment well Patient left: in chair;with chair alarm set;with nursing/sitter in room;Other (comment) (with SLP)  OT Visit Diagnosis: Unsteadiness on feet (R26.81);Low vision, both eyes (H54.2);Dizziness and giddiness (R42)                Time: 9381-0175 OT Time Calculation (min): 10 min Charges:  OT General Charges $OT Visit: 1 Procedure OT Evaluation $OT Eval Moderate Complexity: 1 Procedure G-Codes:     Hildegard Hlavac A. Ulice Brilliant, M.S., OTR/L Pager: [place] 03/09/2017, 5:12 PM

## 2017-03-09 NOTE — Consult Note (Signed)
Chief Complaint: Patient was seen in consultation today for DVT  Referring Physician(s):  Dr. Erlinda Hong  Supervising Physician: Sandi Mariscal  Patient Status: [facility] - In-pt  History of Present Illness: Darryl Parsons is a 61 y.o. male with HTN, CKD, and substance abuse is admitted with headache, nausea, and vomiting.  Patient found to have a large PICA infarct, scattered punctate foci of chronic microhemorrhage, as well as left occipital acute infarct. [place] today demonstrate acute LE DVT.  IR consulted for IVC filter placement in patient who is not a candidate for oral anticoagulation due to extent of stroke. Dr. Pascal Lux reviewed case and agrees to IVC filter placement. On assessment today, patient is alert, oriented.  He is able to tell me where he is and why he is here.  He expresses understanding of the need for IVF and is agreeable to the procedure.   Past Medical History:  Diagnosis Date  . Chronic kidney disease   . Headache   . Hypertension   . Low back pain   . Mental disorder     No past surgical history on file.  Allergies: Other  Medications: Prior to Admission medications   Medication Sig Start Date End Date Taking? Authorizing Provider  amLODipine (NORVASC) 10 MG tablet Take 1 tablet (10 mg total) by mouth daily. 08/31/16  Yes Benjamine Mola, FNP  hydrochlorothiazide (MICROZIDE) 12.5 MG capsule Take 1 capsule (12.5 mg total) by mouth daily. 08/31/16  Yes Benjamine Mola, FNP  QUEtiapine (SEROQUEL) 300 MG tablet Take 1 tablet (300 mg total) by mouth at bedtime. 08/31/16  Yes Benjamine Mola, FNP  sertraline (ZOLOFT) 50 MG tablet Take 1 tablet (50 mg total) by mouth daily. 09/01/16  Yes Benjamine Mola, FNP  cloNIDine (CATAPRES - DOSED IN MG/24 HR) 0.2 mg/24hr patch Place 1 patch (0.2 mg total) onto the skin once a week. Patient not taking: Reported on 03/08/2017 10/16/16   Nita Sells, MD  hydrOXYzine (ATARAX/VISTARIL) 25 MG tablet Take 1 tablet (25 mg total) by mouth  every 6 (six) hours as needed for anxiety (or CIWA score </= 10). 08/31/16   Benjamine Mola, FNP  metoprolol tartrate (LOPRESSOR) 100 MG tablet Take 1 tablet (100 mg total) by mouth 2 (two) times daily. Patient not taking: Reported on 03/08/2017 10/13/16   Annita Brod, MD  thiamine 100 MG tablet Take 1 tablet (100 mg total) by mouth daily. Patient not taking: Reported on 03/08/2017 09/01/16   Benjamine Mola, FNP     Family History  Problem Relation Age of Onset  . Cardiomyopathy Sister   . Lupus Sister   . Kidney failure Sister   . Factor VIII deficiency Neg Hx   . Mental illness Neg Hx     Social History   Social History  . Marital status: Single    Spouse name: N/A  . Number of children: N/A  . Years of education: N/A   Social History Main Topics  . Smoking status: Current Every Day Smoker    Packs/day: 0.50    Types: Cigarettes  . Smokeless tobacco: Never Used  . Alcohol use Yes     Comment: 1-2 beers a month  . Drug use: Yes    Types: Cocaine  . Sexual activity: No   Other Topics Concern  . Not on file   Social History Narrative  . No narrative on file    Review of Systems  Respiratory: Negative for cough and shortness  of breath.   Cardiovascular: Negative for chest pain.  Neurological: Positive for headaches.  Psychiatric/Behavioral: Negative for behavioral problems and confusion.    Vital Signs: BP (!) 157/96   Pulse 74   Temp 98.8 F (37.1 C) (Oral)   Resp 18   Ht '5\' 8"'  (1.727 m)   Wt 189 lb 6 oz (85.9 kg)   SpO2 97%   BMI 28.79 kg/m   Physical Exam  Constitutional: He is oriented to person, place, and time. He appears well-developed.  Cardiovascular: Normal rate, regular rhythm and normal heart sounds.   Pulmonary/Chest: Effort normal and breath sounds normal. No respiratory distress.  Neurological: He is alert and oriented to person, place, and time.  Skin: Skin is warm and dry.  Psychiatric: He has a normal mood and affect. His behavior  is normal. Judgment and thought content normal.  Nursing note and vitals reviewed.   Mallampati Score:  MD Evaluation Airway: WNL Heart: WNL Abdomen: WNL Chest/ Lungs: WNL ASA  Classification: 3 Mallampati/Airway Score: Two  Imaging: Dg Chest 2 View  Result Date: 03/09/2017 CLINICAL DATA:  Headache, dizziness for today, nausea and vomiting EXAM: CHEST  2 VIEW COMPARISON:  Chest x-ray of 10/13/2016 FINDINGS: No active infiltrate or effusion is seen. Minimal volume loss is noted at the right lung base. Mediastinal and hilar contours are unremarkable. The heart is borderline enlarged and stable. No bony abnormality is seen. IMPRESSION: No active lung disease. Minimal volume loss at the right lung base. Stable borderline cardiomegaly. Electronically Signed   By: Ivar Drape M.D.   On: 03/09/2017 11:11   Ct Head Wo Contrast  Result Date: 03/09/2017 CLINICAL DATA:  Headache and nausea.  No trauma. EXAM: CT HEAD WITHOUT CONTRAST TECHNIQUE: Contiguous axial images were obtained from the base of the skull through the vertex without intravenous contrast. COMPARISON:  MR brain 03/08/2017. FINDINGS: Brain: Extensive cytotoxic edema in the cerebellum, associated with a LEFT PICA territory infarct. 3 mm LEFT-to-RIGHT shift of the fourth ventricle is increased. No hemorrhagic transformation. No hydrocephalus. No other areas of infarction are not clearly visible. Vascular: No proximal large vessel occlusion. Hyperdensity of the LEFT PICA correlates with lack of flow on MR. Skull: Normal. Negative for fracture or focal lesion. Sinuses/Orbits: No acute finding. Other: None. IMPRESSION: Extensive cytotoxic edema in the cerebellum associated with the documented LEFT PICA territory infarct. 3 mm LEFT-to-RIGHT shift of the fourth ventricle is increased from yesterday's MR. Close surveillance recommended for symptomatic tonsillar herniation, future hemorrhagic transformation, and/or development of hydrocephalus. These  results will be called to the ordering clinician or representative by the Radiologist Assistant, and communication documented in the PACS or zVision Dashboard. Electronically Signed   By: Staci Righter M.D.   On: 03/09/2017 11:03   [name]  Result Date: 03/08/2017 CLINICAL DATA:  Vertigo EXAM: MRI HEAD WITHOUT CONTRAST TECHNIQUE: Multiplanar, multiecho pulse sequences of the brain and surrounding structures were obtained without intravenous contrast. COMPARISON:  Head CT 08/27/2016 FINDINGS: The examination had to be discontinued prior to completion due to patient refusal to continue. Brain: There is a large area of diffusion restriction within the left cerebellar hemisphere, in the PICA territory, with associated cytotoxic edema and rightward displacement of the midline posterior fossa structures. There is also a small focus of diffusion restriction within the left occipital lobe. No acute hemorrhage. The basal cisterns, cerebral aqueduct and fourth ventricle remain patent. No hydrocephalus or extra-axial fluid collection. The midline structures are normal. No age  advanced or lobar predominant atrophy. There is multifocal hyperintense T2-weighted signal within the periventricular white matter, most often seen in the setting of chronic microvascular ischemia. Vascular: Major intracranial arterial and venous sinus flow voids are preserved. There are scattered foci of chronic microhemorrhage within both cerebral hemispheres, the brainstem and cerebellum. The distribution is nonspecific. Skull and upper cervical spine: The visualized skull base, calvarium, upper cervical spine and extracranial soft tissues are normal. Sinuses/Orbits: No fluid levels or advanced mucosal thickening. No mastoid effusion. Normal orbits. IMPRESSION: 1. Truncated examination due to patient refusal to complete all sequences. 2. Large acute infarct of the left PICA territory with associated cytotoxic edema and rightward  displacement of the midline cerebellar structures. The basal cisterns and fourth ventricle remain patent without obstructive hydrocephalus. No acute hemorrhage. 3. Small left occipital acute infarct. 4. Chronic microvascular ischemia and scattered punctate foci of chronic microhemorrhage. These results will be called to the ordering clinician or representative by the Radiologist Assistant, and communication documented in the PACS or zVision Dashboard. Electronically Signed   By: Ulyses Jarred M.D.   On: 03/08/2017 23:25    Labs:  CBC:  Recent Labs  10/12/16 0326 10/13/16 0357 03/08/17 2105 03/09/17 0408  WBC 8.3 10.4 10.5 11.0*  HGB 11.4* 12.5* 14.4 14.3  HCT 34.4* 37.3* 42.5 42.5  PLT 182 244 218 240    COAGS:  Recent Labs  10/07/16 0448  INR 1.12    BMP:  Recent Labs  10/09/16 0800 10/10/16 0346 10/12/16 0326 10/13/16 0357 03/08/17 2105 03/09/17 0408  NA 142 142 140  --  138  --   K 3.9 3.6 3.2*  --  3.5  --   CL 115* 115* 115*  --  105  --   CO2 19* 17* 20*  --  23  --   GLUCOSE 100* 109* 116*  --  104*  --   BUN 33* 28* 24*  --  17  --   CALCIUM 9.1 8.6* 8.6*  --  9.0  --   CREATININE 1.94* 1.71* 1.88* 1.84* 1.52* 1.54*  GFRNONAA 36* 42* 37* 38* 48* 48*  GFRAA 42* 49* 43* 45* 56* 55*    LIVER FUNCTION TESTS:  Recent Labs  10/07/16 0448 10/09/16 0800 10/12/16 0326 03/08/17 2105  BILITOT 1.2 1.3* 1.1 0.7  AST 26 37 37 30  ALT '19 19 18 ' 14*  ALKPHOS 62 63 54 72  PROT 7.0 7.1 6.1* 8.0  ALBUMIN 4.1 3.8 3.4* 4.1    TUMOR MARKERS: No results for input(s): AFPTM, CEA, CA199, CHROMGRNA in the last 8760 hours.  Assessment and Plan: DVT, large left PICA and occipital infarcts Patient with large CVA is found to have acute DVT.   Per Neuro, patient is not a candidate for oral anticoagulation due to extent of stroke and requests and IVC filter for patient.  Dr. Pascal Lux reviewed case and feels patient is appropriate.   PA met with patient to discuss filter.   He is alert and oriented.  He is able to demonstrate understanding of what the care plan is re: his DVTs. Risks and benefits discussed with the patient including, but not limited to bleeding, infection, contrast induced renal failure, filter fracture or migration which can lead to emergency surgery or even death, strut penetration with damage or irritation to adjacent structures and caval thrombosis. All of the patient's questions were answered, patient is agreeable to proceed. Consent signed and in chart. NPO after midnight.  No need to  hold lovenox.  Thank you for this interesting consult.  I greatly enjoyed meeting Darryl Parsons and look forward to participating in their care.  A copy of this report was sent to the requesting provider on this date.  Electronically Signed: Docia Barrier 03/09/2017, 5:04 PM   I spent a total of 40 Minutes    in face to face in clinical consultation, greater than 50% of which was counseling/coordinating care for DVT

## 2017-03-09 NOTE — Progress Notes (Signed)
PT Cancellation Note  Patient Details Name: CORLEONE BIEGLER MRN: [id number] DOB: 1956-01-07   Cancelled Treatment:    Reason Eval/Treat Not Completed: Other (comment) (patient declining participation at this time)   Duncan Dull 03/09/2017, 10:44 AM

## 2017-03-09 NOTE — Progress Notes (Signed)
CT results called to Dr. Erlinda Hong. Dyane Dustman, Rande Brunt, RN
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
## 2017-03-09 NOTE — Progress Notes (Signed)
Patient angry and agitated with RN and Speech Therapy. Patient refusing care and therapy. He was told that he could not receive pain medications until he passed his stroke swallow screen. He is ignoring staff at this time and is rolled over on his side. Continuing to monitor. Stony Stegmann, Rande Brunt, RN

## 2017-03-09 NOTE — Progress Notes (Signed)
Rehab Admissions Coordinator Note:  Patient was screened by Cleatrice Burke for appropriateness for an Inpatient Acute Rehab Consult.  At this time, we are recommending Inpatient Rehab consult.  Cleatrice Burke 03/09/2017, 8:01 PM  I can be reached at [phone].

## 2017-03-09 NOTE — Progress Notes (Signed)
SLP Cancellation Note  Patient Details Name: IKEEM CLECKLER MRN: [id number] DOB: 1956-08-18   Cancelled treatment:       Reason Eval/Treat Not Completed: Patient declined, no reason specified. Pt refusing swallow evaluation this time despite encouragement and education from SLP and RN. Will try again at a later time.   Germain Osgood 03/09/2017, 10:26 AM   Germain Osgood, M.A. CCC-SLP [phone]

## 2017-03-09 NOTE — Progress Notes (Signed)
Patient gave verbal consent to call his significant other [name] (number in the chart) if information is needed or as his emergency contact. Capitola Ladson, Rande Brunt, RN
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
## 2017-03-09 NOTE — ED Notes (Signed)
[facility] contacted for transport to [facility]

## 2017-03-09 NOTE — Evaluation (Addendum)
Clinical/Bedside Swallow Evaluation Patient Details  Name: Darryl Parsons MRN: [id number] Date of Birth: 28-Oct-1956  Today's Date: 03/09/2017 Time: SLP Start Time (ACUTE ONLY): 1600 SLP Stop Time (ACUTE ONLY): 1611 SLP Time Calculation (min) (ACUTE ONLY): 11 min  Past Medical History:  Past Medical History:  Diagnosis Date  . Chronic kidney disease   . Headache   . Hypertension   . Low back pain   . Mental disorder    Past Surgical History: No past surgical history on file. HPI:  61 year old male admitted 03/08/17 due to 1 day history of headache, dizziness, nausea and vomiting. PMH significant for CKD, headache, HTN, low back pain, mental disorder. CXR revealed no infiltrate. MRI revealed large Left PCA and small left occipital infarcts. BSE ordered to determine least restrictive diet.   Assessment / Plan / Recommendation Clinical Impression  Pt completed oral care after set-up. Adequate oral motor strength and function. Timely oral prep and swallow noted. No overt s/s aspiration observed on any consistency tested. Strong volitional swallow noted with adequate laryngeal elevation upon palpation. Recommend regular diet and thin liquids, meds whole with liquids. ST will follow for assessment of diet tolerance and for completion of cog/com evaluation.   SLE deferred at this time due to significant headache. Will complete next date. RN aware.  SLP Visit Diagnosis: Dysphagia, unspecified (R13.10)    Aspiration Risk  Mild aspiration risk    Diet Recommendation Thin liquid;Regular   Liquid Administration via: Cup;Straw Medication Administration: Whole meds with liquid Supervision: Patient able to self feed Compensations: Minimize environmental distractions;Slow rate;Small sips/bites Postural Changes: Seated upright at 90 degrees    Other  Recommendations Oral Care Recommendations: Oral care BID   Follow up Recommendations None      Frequency and Duration min 1 x/week  2  weeks;1 week       Prognosis Prognosis for Safe Diet Advancement: Good      Swallow Study   General Date of Onset: 03/08/17 HPI: 61 year old male admitted 03/08/17. PMH significant for CXR revealed no infiltrate. MRI revealed large Left PCA and small left occipital infarcts. Type of Study: Bedside Swallow Evaluation Previous Swallow Assessment: none Diet Prior to this Study: NPO Temperature Spikes Noted: No Respiratory Status: Room air History of Recent Intubation: No Behavior/Cognition: Alert;Cooperative Oral Cavity Assessment: Within Functional Limits Oral Care Completed by SLP: Yes Oral Cavity - Dentition: Adequate natural dentition Vision: Functional for self-feeding Self-Feeding Abilities: Able to feed self Patient Positioning: Upright in chair Baseline Vocal Quality: Normal Volitional Cough: Strong Volitional Swallow: Able to elicit    Oral/Motor/Sensory Function Overall Oral Motor/Sensory Function: Within functional limits   Ice Chips Ice chips: Within functional limits Presentation: Spoon   Thin Liquid Thin Liquid: Within functional limits Presentation: Cup;Straw;Self Fed    Nectar Thick Nectar Thick Liquid: Not tested   Honey Thick Honey Thick Liquid: Not tested   Puree Puree: Within functional limits Presentation: Spoon   Solid   GO   Solid: Within functional limits Presentation: [place] [address] Qu[facility] Ore [facility], [place] [phone]  Shonna Chock 03/09/2017,4:17 PM

## 2017-03-09 NOTE — Consult Note (Signed)
Reason for Consult: Cerebellar stroke Referring Physician: Stroke service  Darryl Parsons is an 61 y.o. male.  HPI: 61 year old male admitted with left posterior inferior cerebellar infarct. Patient hemodynamically stable. Complaints of headache. Some mild ataxia. Some mild confusion.  Past Medical History:  Diagnosis Date  . Chronic kidney disease   . Headache   . Hypertension   . Low back pain   . Mental disorder     No past surgical history on file.  Family History  Problem Relation Age of Onset  . Cardiomyopathy Sister   . Lupus Sister   . Kidney failure Sister   . Factor VIII deficiency Neg Hx   . Mental illness Neg Hx     Social History:  reports that he has been smoking Cigarettes.  He has been smoking about 0.50 packs per day. He has never used smokeless tobacco. He reports that he drinks alcohol. He reports that he uses drugs, including Cocaine.  Allergies:  Allergies  Allergen Reactions  . Other     Pt states he is allergic to a blood pressure pill. He is unable to state reaction. He is spelling it adelatt and aderlatt.    Medications: I have reviewed the patient's current medications.  Results for orders placed or performed during the hospital encounter of 03/08/17 (from the past 48 hour(s))  CBG monitoring, ED     Status: Abnormal   Collection Time: 03/08/17  9:00 PM  Result Value Ref Range   Glucose-Capillary 108 (H) 65 - 99 mg/dL  CBC with Differential/Platelet     Status: Abnormal   Collection Time: 03/08/17  9:05 PM  Result Value Ref Range   WBC 10.5 4.0 - 10.5 K/uL   RBC 5.14 4.22 - 5.81 MIL/uL   Hemoglobin 14.4 13.0 - 17.0 g/dL   HCT 42.5 39.0 - 52.0 %   MCV 82.7 78.0 - 100.0 fL   MCH 28.0 26.0 - 34.0 pg   MCHC 33.9 30.0 - 36.0 g/dL   RDW 14.3 11.5 - 15.5 %   Platelets 218 150 - 400 K/uL   Neutrophils Relative % 81 %   Neutro Abs 8.5 (H) 1.7 - 7.7 K/uL   Lymphocytes Relative 15 %   Lymphs Abs 1.5 0.7 - 4.0 K/uL   Monocytes Relative 4 %   Monocytes Absolute 0.4 0.1 - 1.0 K/uL   Eosinophils Relative 0 %   Eosinophils Absolute 0.0 0.0 - 0.7 K/uL   Basophils Relative 0 %   Basophils Absolute 0.0 0.0 - 0.1 K/uL  Comprehensive metabolic panel     Status: Abnormal   Collection Time: 03/08/17  9:05 PM  Result Value Ref Range   Sodium 138 135 - 145 mmol/L   Potassium 3.5 3.5 - 5.1 mmol/L   Chloride 105 101 - 111 mmol/L   CO2 23 22 - 32 mmol/L   Glucose, Bld 104 (H) 65 - 99 mg/dL   BUN 17 6 - 20 mg/dL   Creatinine, Ser 1.52 (H) 0.61 - 1.24 mg/dL   Calcium 9.0 8.9 - 10.3 mg/dL   Total Protein 8.0 6.5 - 8.1 g/dL   Albumin 4.1 3.5 - 5.0 g/dL   AST 30 15 - 41 U/L   ALT 14 (L) 17 - 63 U/L   Alkaline Phosphatase 72 38 - 126 U/L   Total Bilirubin 0.7 0.3 - 1.2 mg/dL   GFR calc non Af Amer 48 (L) >60 mL/min   GFR calc Af Amer 56 (L) >60 mL/min  Comment: (NOTE) The eGFR has been calculated using the CKD EPI equation. This calculation has not been validated in all clinical situations. eGFR's persistently <60 mL/min signify possible Chronic Kidney Disease.    Anion gap 10 5 - 15  MRSA PCR Screening     Status: None   Collection Time: 03/09/17  2:34 AM  Result Value Ref Range   MRSA by PCR NEGATIVE NEGATIVE    Comment:        The GeneXpert MRSA Assay (FDA approved for NASAL specimens only), is one component of a comprehensive MRSA colonization surveillance program. It is not intended to diagnose MRSA infection nor to guide or monitor treatment for MRSA infections.   Lipid panel     Status: Abnormal   Collection Time: 03/09/17  4:08 AM  Result Value Ref Range   Cholesterol 226 (H) 0 - 200 mg/dL   Triglycerides 85 <150 mg/dL   HDL 74 >40 mg/dL   Total CHOL/HDL Ratio 3.1 RATIO   VLDL 17 0 - 40 mg/dL   LDL Cholesterol 135 (H) 0 - 99 mg/dL    Comment:        Total Cholesterol/HDL:CHD Risk Coronary Heart Disease Risk Table                     Men   Women  1/2 Average Risk   3.4   3.3  Average Risk       5.0   4.4   2 X Average Risk   9.6   7.1  3 X Average Risk  23.4   11.0        Use the calculated Patient Ratio above and the CHD Risk Table to determine the patient's CHD Risk.        ATP III CLASSIFICATION (LDL):  <100     mg/dL   Optimal  100-129  mg/dL   Near or Above                    Optimal  130-159  mg/dL   Borderline  160-189  mg/dL   High  >190     mg/dL   Very High   CBC     Status: Abnormal   Collection Time: 03/09/17  4:08 AM  Result Value Ref Range   WBC 11.0 (H) 4.0 - 10.5 K/uL   RBC 5.14 4.22 - 5.81 MIL/uL   Hemoglobin 14.3 13.0 - 17.0 g/dL   HCT 42.5 39.0 - 52.0 %   MCV 82.7 78.0 - 100.0 fL   MCH 27.8 26.0 - 34.0 pg   MCHC 33.6 30.0 - 36.0 g/dL   RDW 14.3 11.5 - 15.5 %   Platelets 240 150 - 400 K/uL  Creatinine, serum     Status: Abnormal   Collection Time: 03/09/17  4:08 AM  Result Value Ref Range   Creatinine, Ser 1.54 (H) 0.61 - 1.24 mg/dL   GFR calc non Af Amer 48 (L) >60 mL/min   GFR calc Af Amer 55 (L) >60 mL/min    Comment: (NOTE) The eGFR has been calculated using the CKD EPI equation. This calculation has not been validated in all clinical situations. eGFR's persistently <60 mL/min signify possible Chronic Kidney Disease.     [name]  Result Date: 03/08/2017 CLINICAL DATA:  Vertigo EXAM: MRI HEAD WITHOUT CONTRAST TECHNIQUE: Multiplanar, multiecho pulse sequences of the brain and surrounding structures were obtained without intravenous contrast. COMPARISON:  Head CT 08/27/2016 FINDINGS: The  examination had to be discontinued prior to completion due to patient refusal to continue. Brain: There is a large area of diffusion restriction within the left cerebellar hemisphere, in the PICA territory, with associated cytotoxic edema and rightward displacement of the midline posterior fossa structures. There is also a small focus of diffusion restriction within the left occipital lobe. No acute hemorrhage. The basal cisterns, cerebral aqueduct and fourth  ventricle remain patent. No hydrocephalus or extra-axial fluid collection. The midline structures are normal. No age advanced or lobar predominant atrophy. There is multifocal hyperintense T2-weighted signal within the periventricular white matter, most often seen in the setting of chronic microvascular ischemia. Vascular: Major intracranial arterial and venous sinus flow voids are preserved. There are scattered foci of chronic microhemorrhage within both cerebral hemispheres, the brainstem and cerebellum. The distribution is nonspecific. Skull and upper cervical spine: The visualized skull base, calvarium, upper cervical spine and extracranial soft tissues are normal. Sinuses/Orbits: No fluid levels or advanced mucosal thickening. No mastoid effusion. Normal orbits. IMPRESSION: 1. Truncated examination due to patient refusal to complete all sequences. 2. 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 The basal cisterns and fourth ventricle remain patent without obstructive hydrocephalus. No acute hemorrhage. 3. Small left occipital acute infarct. 4. Chronic microvascular ischemia and scattered punctate foci of chronic microhemorrhage. These results will be called to the ordering clinician or representative by the Radiologist Assistant, and communication documented in the PACS or zVision Dashboard. Electronically Signed   By: Ulyses Jarred M.D.   On: 03/08/2017 23:25    Review of systems not obtained due to patient factors. Blood pressure (!) 151/98, pulse 74, temperature 98.2 F (36.8 C), temperature source Oral, resp. rate 17, height '5\' 8"'  (1.727 m), weight 85.9 kg (189 lb 6 oz), SpO2 98 %. Patient is awake and alert. He is oriented and reasonably appropriate although not altogether cooperative. His speech is fluent. His cranial nerve function appears intact bilaterally. Motor examination 5/5 bilaterally without  evidence of pronator drift. Examination head ears eyes nose throat is unremarkable. Chest and abdomen are benign.  Assessment/Plan: Acute left cerebellar infarct. Plan follow-up CT scan later today to evaluate for hydrocephalus or worsening brainstem compression. Continue ICU observation.  Darryl Parsons A 03/09/2017, 9:54 AM

## 2017-03-09 NOTE — Progress Notes (Signed)
[facility] Progress Note Patient Name: Darryl Parsons DOB: [birth year] MRN: [id number]   Date of Service  03/09/2017  HPI/Events of Note  Admitted to neurohospitalist service with acute posterior circulation CVA. Care plan reviewed. Pt is in no distress  eICU Interventions  Care plan reviewed. No changes made     Intervention Category Evaluation Type: New Patient Evaluation  Wilhelmina Mcardle 03/09/2017, 2:41 AM

## 2017-03-09 NOTE — Evaluation (Signed)
Physical Therapy Evaluation Patient Details Name: Darryl Parsons MRN: [id number] DOB: [birth year] Today's Date: 03/09/2017   History of Present Illness  61 y.o. male who presented to the ED with severe headache, dizziness, nausea and vomiting. 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  Clinical Impression  Patient demonstrates deficits in functional mobility as indicated below. Will need continued skilled PT to address deficits and maximize function. Will see as indicated and progress as tolerated.  OF NOTE: limited evaluation at this time, will continue to further assess next visit. Anticipate visual spatial deficits during mobility tasks. Patient reports unable to ambulate at this time. Will re-attempt as tolerated.     Follow Up Recommendations CIR;Supervision/Assistance - 24 hour (pending willingness to participate)    Equipment Recommendations  Other (comment) (TBD)    Recommendations for Other Services       Precautions / Restrictions Precautions Precautions: Fall Restrictions Weight Bearing Restrictions: No      Mobility  Bed Mobility Overal bed mobility: Needs Assistance Bed Mobility: Supine to Sit     Supine to sit: Min guard     General bed mobility comments: Min guard for safety. Pt c/o dizziness with positional changes.  Transfers Overall transfer level: Needs assistance Equipment used: None Transfers: Stand Pivot Transfers   Stand pivot transfers: Min guard       General transfer comment: For safety; pt impulsive with transfer.  Ambulation/Gait             General Gait Details: patient deferring  Stairs            Wheelchair Mobility    Modified Rankin (Stroke Patients Only)       Balance Overall balance assessment: Needs assistance Sitting-balance support: Feet supported;Bilateral upper extremity supported Sitting balance-Leahy Scale: Fair     Standing balance support:  Bilateral upper extremity supported Standing balance-Leahy Scale: Poor Standing balance comment: holding onto bed rail and arm rail of chair to transfer                             Pertinent Vitals/Pain Pain Assessment: 0-10 Pain Score: 10-Worst pain ever Pain Location: headache Pain Descriptors / Indicators: Aching;Headache Pain Intervention(s): Monitored during session;Limited activity within patient's tolerance;Repositioned    Home Living   Living Arrangements: Spouse/significant other (common law wife "[name]") Available Help at Discharge: Family           Home Equipment: Kasandra Knudsen - single point      Prior Function Level of Independence: Independent               Hand Dominance   Dominant Hand: Right    Extremity/Trunk Assessment   Upper Extremity Assessment Upper Extremity Assessment: Overall WFL for tasks assessed    Lower Extremity Assessment Lower Extremity Assessment: Difficult to assess due to impaired cognition (appears [facility] but patient unwilling for isolated assessment)    Cervical / Trunk Assessment Cervical / Trunk Assessment: Normal  Communication   Communication: No difficulties  Cognition Arousal/Alertness: Awake/alert Behavior During Therapy: Impulsive;Flat affect Overall Cognitive Status: No family/caregiver present to determine baseline cognitive functioning                                        General Comments      Exercises  Assessment/Plan    PT Assessment Patient needs continued PT services  PT Problem List Decreased activity tolerance;Decreased balance;Decreased mobility;Decreased cognition;Decreased safety awareness;Pain       PT Treatment Interventions DME instruction;Gait training;Functional mobility training;Therapeutic activities;Therapeutic exercise;Balance training;Cognitive remediation;Patient/family education    PT Goals (Current goals can be found in the Care Plan section)  Acute  Rehab PT Goals Patient Stated Goal: decrease pain PT Goal Formulation: With patient Time For Goal Achievement: 03/23/17 Potential to Achieve Goals: Good    Frequency Min 4X/week   Barriers to discharge Decreased caregiver support      Co-evaluation PT/OT/SLP Co-Evaluation/Treatment: Yes Reason for Co-Treatment: For patient/therapist safety (patient only willing to perform OOB activity once today) PT goals addressed during session: Mobility/safety with mobility;Other (comment) OT goals addressed during session: ADL's and self-care       End of Session   Activity Tolerance: Patient tolerated treatment well Patient left: in chair;with call bell/phone within reach;with chair alarm set;Other (comment) (with SLP in room) Nurse Communication: Mobility status PT Visit Diagnosis: Difficulty in walking, not elsewhere classified (R26.2)    Time: 1550-1601 PT Time Calculation (min) (ACUTE ONLY): 11 min   Charges:   PT Evaluation $PT Eval Moderate Complexity: 1 Procedure     PT G Codes:        Alben Deeds, PT DPT  [phone]   Duncan Dull 03/09/2017, 5:24 PM

## 2017-03-10 ENCOUNTER — Inpatient Hospital Stay (HOSPITAL_COMMUNITY): Payer: Medicaid Other

## 2017-03-10 ENCOUNTER — Encounter (HOSPITAL_COMMUNITY): Payer: Self-pay | Admitting: Interventional Radiology

## 2017-03-10 DIAGNOSIS — I6350 Cerebral infarction due to unspecified occlusion or stenosis of unspecified cerebral artery: Secondary | ICD-10-CM

## 2017-03-10 DIAGNOSIS — I82401 Acute embolism and thrombosis of unspecified deep veins of right lower extremity: Secondary | ICD-10-CM

## 2017-03-10 DIAGNOSIS — R270 Ataxia, unspecified: Secondary | ICD-10-CM

## 2017-03-10 DIAGNOSIS — Z789 Other specified health status: Secondary | ICD-10-CM

## 2017-03-10 DIAGNOSIS — I63542 Cerebral infarction due to unspecified occlusion or stenosis of left cerebellar artery: Secondary | ICD-10-CM

## 2017-03-10 DIAGNOSIS — I639 Cerebral infarction, unspecified: Secondary | ICD-10-CM

## 2017-03-10 HISTORY — PX: IR IVC FILTER PLMT / S&I /IMG GUID/MOD SED: IMG701

## 2017-03-10 LAB — BASIC METABOLIC PANEL
Anion gap: 8 (ref 5–15)
BUN: 14 mg/dL (ref 6–20)
CALCIUM: 9.1 mg/dL (ref 8.9–10.3)
CHLORIDE: 107 mmol/L (ref 101–111)
CO2: 22 mmol/L (ref 22–32)
CREATININE: 1.47 mg/dL — AB (ref 0.61–1.24)
GFR calc non Af Amer: 50 mL/min — ABNORMAL LOW (ref >60)
GFR, EST AFRICAN AMERICAN: 59 mL/min — AB (ref >60)
GLUCOSE: 113 mg/dL — AB (ref 65–99)
Potassium: 3.2 mmol/L — ABNORMAL LOW (ref 3.5–5.1)
Sodium: 137 mmol/L (ref 135–145)

## 2017-03-10 LAB — PROTIME-INR
INR: 0.98
Prothrombin Time: 13 seconds (ref 11.4–15.2)

## 2017-03-10 LAB — CBC
HEMATOCRIT: 45.6 % (ref 39.0–52.0)
HEMOGLOBIN: 15.2 g/dL (ref 13.0–17.0)
MCH: 27.3 pg (ref 26.0–34.0)
MCHC: 33.3 g/dL (ref 30.0–36.0)
MCV: 82 fL (ref 78.0–100.0)
Platelets: 245 10*3/uL (ref 150–400)
RBC: 5.56 MIL/uL (ref 4.22–5.81)
RDW: 14.7 % (ref 11.5–15.5)
WBC: 11.4 10*3/uL — ABNORMAL HIGH (ref 4.0–10.5)

## 2017-03-10 LAB — SODIUM
SODIUM: 143 mmol/L (ref 135–145)
Sodium: 139 mmol/L (ref 135–145)
Sodium: 135 mmol/L (ref 135–145)

## 2017-03-10 LAB — HEMOGLOBIN A1C
Hgb A1c MFr Bld: <4.2 % — ABNORMAL LOW (ref 4.8–5.6)
Mean Plasma Glucose: <74 mg/dL

## 2017-03-10 MED ORDER — FENTANYL CITRATE (PF) 100 MCG/2ML IJ SOLN
12.5000 ug | INTRAMUSCULAR | Status: DC | PRN
  Administered 2017-03-10 (×2): 12.5 ug via INTRAVENOUS
  Filled 2017-03-10 (×2): qty 2

## 2017-03-10 MED ORDER — FENTANYL CITRATE (PF) 100 MCG/2ML IJ SOLN
INTRAMUSCULAR | Status: AC | PRN
  Administered 2017-03-10: 25 ug via INTRAVENOUS

## 2017-03-10 MED ORDER — SODIUM CHLORIDE 23.4 % INJECTION (4 MEQ/ML) FOR IV ADMINISTRATION
30.0000 mL | Freq: Once | INTRAVENOUS | Status: AC
  Administered 2017-03-10: 30 mL via INTRAVENOUS
  Filled 2017-03-10: qty 30

## 2017-03-10 MED ORDER — LIDOCAINE HCL 1 % IJ SOLN
INTRAMUSCULAR | Status: AC
  Filled 2017-03-10: qty 20

## 2017-03-10 MED ORDER — ENOXAPARIN SODIUM 40 MG/0.4ML ~~LOC~~ SOLN
40.0000 mg | SUBCUTANEOUS | Status: DC
  Administered 2017-03-10 – 2017-03-19 (×7): 40 mg via SUBCUTANEOUS
  Filled 2017-03-10 (×9): qty 0.4

## 2017-03-10 MED ORDER — FENTANYL CITRATE (PF) 100 MCG/2ML IJ SOLN
INTRAMUSCULAR | Status: AC
  Filled 2017-03-10: qty 2

## 2017-03-10 MED ORDER — LIDOCAINE HCL (PF) 1 % IJ SOLN
INTRAMUSCULAR | Status: AC | PRN
  Administered 2017-03-10: 10 mL

## 2017-03-10 MED ORDER — POTASSIUM CHLORIDE CRYS ER 20 MEQ PO TBCR
20.0000 meq | EXTENDED_RELEASE_TABLET | Freq: Two times a day (BID) | ORAL | Status: AC
  Administered 2017-03-10 (×2): 20 meq via ORAL
  Filled 2017-03-10 (×2): qty 1

## 2017-03-10 MED ORDER — MIDAZOLAM HCL 2 MG/2ML IJ SOLN
INTRAMUSCULAR | Status: AC | PRN
  Administered 2017-03-10 (×2): 1 mg via INTRAVENOUS

## 2017-03-10 MED ORDER — MIDAZOLAM HCL 2 MG/2ML IJ SOLN
INTRAMUSCULAR | Status: AC
  Filled 2017-03-10: qty 2

## 2017-03-10 MED ORDER — IOPAMIDOL (ISOVUE-300) INJECTION 61%
INTRAVENOUS | Status: AC
  Filled 2017-03-10: qty 150

## 2017-03-10 NOTE — Progress Notes (Signed)
SLP Cancellation Note  Patient Details Name: Darryl Parsons MRN: [id number] DOB: 08-Jul-1956   Cancelled treatment:       Reason Eval/Treat Not Completed: Patient at procedure or test/unavailable   Germain Osgood 03/10/2017, 9:18 AM  Germain Osgood, M.A. CCC-SLP [phone]

## 2017-03-10 NOTE — Progress Notes (Signed)
  Speech Language Pathology Treatment: Dysphagia  Patient Details Name: NAFTALI CARCHI MRN: [id number] DOB: 14-Jan-1956 Today's Date: 03/10/2017 Time: [id number] SLP Time Calculation (min) (ACUTE ONLY): 8 min  Assessment / Plan / Recommendation Clinical Impression  SLP provided skilled observation of swallowing as RN administered meds whole with thin liquid wash. Pt consumed mixed consistencies with no overt s/s of aspiration or dysphagia, even with large pills. Recommend to continue with regular diet textures and thin liquids. No SLP f/u indicated for dysphagia.   HPI HPI: 61 year old male admitted 03/08/17. PMH significant for CXR revealed no infiltrate. MRI revealed large Left PCA and small left occipital infarcts.      SLP Plan  All goals met       Recommendations  Diet recommendations: Regular;Thin liquid Liquids provided via: Cup;Straw Medication Administration: Whole meds with liquid Supervision: Patient able to self feed;Intermittent supervision to cue for compensatory strategies Compensations: Minimize environmental distractions;Slow rate;Small sips/bites Postural Changes and/or Swallow Maneuvers: Seated upright 90 degrees                Oral Care Recommendations: Oral care BID Follow up Recommendations: Other (comment) (none for dysphagia) SLP Visit Diagnosis: Dysphagia, unspecified (R13.10) Plan: All goals met       GO                Germain Osgood 03/10/2017, 1:09 PM  Germain Osgood, M.A. CCC-SLP [phone]

## 2017-03-10 NOTE — Progress Notes (Signed)
Physical Therapy Treatment Patient Details Name: Darryl Parsons MRN: [id number] DOB: 11-03-1956 Today's Date: 03/10/2017    History of Present Illness Pt is a 61 y.o. male admitted to ED on 03/08/17 with severe headache, dizziness, nausea and vomiting. MRI on 4/24 showed large acute infarct of L PICA and small L occipital acute infarct. Pt s/p IVC filter placement 4/26. Pertinent PMH includes mental disorder, HTN, CKD, LBP.    PT Comments    Pt progressing slowly with mobility; remains limited by headache and decreased willingness to participate. Performed bed mobility with min guard and stood with HHA and minA for balance; pt remains impulsive with movement. Pt refusing further mobility. Will continue to follow acutely.    Follow Up Recommendations  CIR;Supervision/Assistance - 24 hour (pending willingess to participate)     Equipment Recommendations  Other (comment) (TBD)    Recommendations for Other Services       Precautions / Restrictions Precautions Precautions: Fall Restrictions Weight Bearing Restrictions: No    Mobility  Bed Mobility Overal bed mobility: Needs Assistance Bed Mobility: Supine to Sit;Sit to Supine     Supine to sit: Min guard Sit to supine: Min guard   General bed mobility comments: Min guard for safety; pt c/o headache with sitting. Required minA to scoot up in bed; able to assist with BUEs/BLEs.  Transfers Overall transfer level: Needs assistance Equipment used: 1 person hand held assist Transfers: Sit to/from Stand Sit to Stand: Min assist         General transfer comment: Pt impulsive with standing, requiring minA for safety and HHA.   Ambulation/Gait             General Gait Details: Patient refusing further mobility   Stairs            Wheelchair Mobility    Modified Rankin (Stroke Patients Only) Modified Rankin (Stroke Patients Only) Pre-Morbid Rankin Score: No symptoms Modified Rankin: Moderately severe  disability     Balance Overall balance assessment: Needs assistance Sitting-balance support: Feet supported;No upper extremity supported Sitting balance-Leahy Scale: Fair     Standing balance support: Single extremity supported Standing balance-Leahy Scale: Poor Standing balance comment: Reliant on single UE support for standing balance                            Cognition Arousal/Alertness: Awake/alert Behavior During Therapy: Impulsive;Flat affect Overall Cognitive Status: No family/caregiver present to determine baseline cognitive functioning Area of Impairment: Safety/judgement;Awareness                         Safety/Judgement: Decreased awareness of safety Awareness: Emergent   General Comments: Pt requiring max encouragement to participate with therapy. Impulsive with movement, requiring multiple cues for safety with lines/leads.       Exercises      General Comments        Pertinent Vitals/Pain Pain Assessment: Faces Faces Pain Scale: Hurts whole lot Pain Location: headache with mobility Pain Descriptors / Indicators: Aching;Headache Pain Intervention(s): Limited activity within patient's tolerance;Monitored during session;Patient requesting pain meds-RN notified    Home Living     Available Help at Discharge: Family;Friend(s);Available 24 hours/day                Prior Function            PT Goals (current goals can now be found in the care plan section) Acute Rehab PT  Goals Patient Stated Goal: decrease pain PT Goal Formulation: With patient Time For Goal Achievement: 03/23/17 Potential to Achieve Goals: Good Progress towards PT goals: Progressing toward goals    Frequency    Min 4X/week      PT Plan      Co-evaluation             End of Session   Activity Tolerance: Patient limited by pain Patient left: in bed;with call bell/phone within reach;with bed alarm set Nurse Communication: Mobility  status PT Visit Diagnosis: Difficulty in walking, not elsewhere classified (R26.2)     Time: 9163-8466 PT Time Calculation (min) (ACUTE ONLY): 14 min  Charges:  $Therapeutic Activity: 8-22 mins                    G Codes:      Enis Gash, SPT [phone]  [name] 03/10/2017, 2:04 PM

## 2017-03-10 NOTE — Procedures (Signed)
IVCgram: negative IVC filter placed, infrarenal, retrievable No complication No blood loss. See complete dictation in [facility] PACS  
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
## 2017-03-10 NOTE — Evaluation (Signed)
Speech Language Pathology Evaluation Patient Details Name: Darryl Parsons MRN: [id number] DOB: August 10, 1956 Today's Date: 03/10/2017 Time: 0092-3300 SLP Time Calculation (min) (ACUTE ONLY): 15 min  Problem List:  Patient Active Problem List   Diagnosis Date Noted  . Central venous catheter in place   . 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 Posterior circulation stroke ([place]) 03/09/2017  . Stroke (cerebrum) ([place]) 03/09/2017  . Hypovitaminosis   . Tobacco use disorder   . Overdose 10/06/2016  . Schizoaffective disorder, depressive type ([place]) 08/27/2016  . Cocaine use disorder, mild, abuse 08/27/2016  . Essential hypertension 08/27/2016  . Chronic headache 08/27/2016  . Suicidal ideation 02/03/2016  . Encephalopathy acute 01/30/2016  . CKD (chronic kidney disease) stage 4, GFR 15-29 ml/min ([facility]) 01/30/2016  . Benign essential HTN 01/30/2016  . Cocaine abuse 08/21/2012  . Hypokalemia 08/21/2012   Past Medical History:  Past Medical History:  Diagnosis Date  . Chronic kidney disease   . Headache   . Hypertension   . Low back pain   . Mental disorder    Past Surgical History:  Past Surgical History:  Procedure Laterality Date  . IR IVC FILTER PLMT / S&I /IMG GUID/MOD SED  03/10/2017   HPI:  61 year old male admitted 03/08/17. PMH significant for CXR revealed no infiltrate. MRI revealed large Left PCA and small left occipital infarcts.   Assessment / Plan / Recommendation Clinical Impression  Pt presents with reduced sustained attention, delayed and immediate recall, and basic verbal problem solving. He has mildly slurred speech with Min cues provided for increased volume to facilitate intelligibility at the conversational level. It is unclear what his cognitive/communicative baseline is and he seems to be impacted somewhat by current lethargy, but pt also exhibits reduced awareness of current level of function. Recommend additional SLP  f/u to maximize functional cognition and safety.    SLP Assessment  SLP Recommendation/Assessment: Patient needs continued Speech Lanaguage Pathology Services SLP Visit Diagnosis: Cognitive communication deficit (R41.841)    Follow Up Recommendations  Inpatient Rehab    Frequency and Duration min 2x/week  2 weeks      SLP Evaluation Cognition  Overall Cognitive Status: No family/caregiver present to determine baseline cognitive functioning Arousal/Alertness:  (drowsy) Orientation Level: Oriented X4 Attention: Sustained Sustained Attention: Impaired Sustained Attention Impairment: Verbal basic;Functional basic Memory: Impaired Memory Impairment: Decreased recall of new information Awareness: Impaired Awareness Impairment: Intellectual impairment Problem Solving: Impaired Problem Solving Impairment: Verbal basic Safety/Judgment: Impaired       Comprehension  Auditory Comprehension Overall Auditory Comprehension: Appears within functional limits for tasks assessed    Expression Expression Primary Mode of Expression: Verbal Verbal Expression Overall Verbal Expression: Appears within functional limits for tasks assessed   Oral / Motor  Motor Speech Overall Motor Speech: Impaired Respiration: Within functional limits Phonation: Low vocal intensity Articulation: Impaired Level of Impairment: Conversation Intelligibility: Intelligibility reduced Conversation: 75-100% accurate Motor Planning: Witnin functional limits Motor Speech Errors: Not applicable   GO                    Germain Osgood 03/10/2017, 1:19 PM  Germain Osgood, M.A. CCC-SLP [phone]

## 2017-03-10 NOTE — Procedures (Signed)
Central Venous Catheter Insertion Procedure Note GADGE HERMIZ [id number] 10-31-1956  Procedure: Insertion of Central Venous Catheter Indications: Drug and/or fluid administration- 3% saline  Procedure Details Consent: Risks of procedure as well as the alternatives and risks of each were explained to the (patient/caregiver).  Consent for procedure obtained. Time Out: Verified patient identification, verified procedure, site/side was marked, verified correct patient position, special equipment/implants available, medications/allergies/relevent history reviewed, required imaging and test results available.  Performed  Maximum sterile technique was used including antiseptics, cap, gloves, gown, hand hygiene, mask and sheet. Skin prep: Chlorhexidine; local anesthetic administered A antimicrobial bonded/coated triple lumen catheter was placed in the left internal jugular vein using the Seldinger technique.  Biopatch applied.   Evaluation Blood flow good Complications: No apparent complications Patient did tolerate procedure well. Chest X-ray ordered to verify placement.  CXR: pending.  Procedure performed under direct supervision of Dr. Titus Mould and with ultrasound guidance for real time vessel cannulation.     Kennieth Rad, AGACNP-BC [facility] Pgr: [phone] or if no answer [phone] 03/10/2017, 12:15 PM   Infiltrated 3%, NO PIV optiojn  [place]  Lavon Paganini. Titus Mould, MD, [place] Pgr: [facility]

## 2017-03-10 NOTE — Progress Notes (Signed)
Inpatient Rehabilitation  Met with patient to discuss team's recommendation for IP Rehab.  Shared booklets and answered questions, patient with poor insight into current deficits and denying need for rehab.  Plan to follow up with significant other, [name] via phone later today.  Spoke with MD who anticipated medical readiness sometime next week.  Plan to follow along for medical readiness and patient/family decision.  Please call with questions.  Carmelia Roller., CCC/SLP Admission Coordinator  [place]  Cell [phone]

## 2017-03-10 NOTE — Progress Notes (Signed)
STROKE TEAM PROGRESS NOTE   SUBJECTIVE (INTERVAL HISTORY) Patient RN is at bedside. Pt awake alert and no significant neuro changes. Overnight on 3% saline from peripheral line. Will place central line. Pt is going to have IVC filter and repeat CT head.     OBJECTIVE Temp:  [98.2 F (36.8 C)-99.1 F (37.3 C)] 99.1 F (37.3 C) (04/26 1200) Pulse Rate:  [63-97] 92 (04/26 1500) Cardiac Rhythm: Normal sinus rhythm (04/26 0950) Resp:  [12-28] 23 (04/26 1500) BP: (145-188)/(90-129) 175/98 (04/26 1600) SpO2:  [95 %-100 %] 95 % (04/26 0950)  CBC:   Recent Labs Lab 03/08/17 2105 03/09/17 0408 03/10/17 0549  WBC 10.5 11.0* 11.4*  NEUTROABS 8.5*  --   --   HGB 14.4 14.3 15.2  HCT 42.5 42.5 45.6  MCV 82.7 82.7 82.0  PLT 218 240 294    Basic Metabolic Panel:  Recent Labs Lab 03/08/17 2105 03/09/17 0408  03/10/17 0549 03/10/17 1122  NA 138  --   < > 137 139  K 3.5  --   --  3.2*  --   CL 105  --   --  107  --   CO2 23  --   --  22  --   GLUCOSE 104*  --   --  113*  --   BUN 17  --   --  14  --   CREATININE 1.52* 1.54*  --  1.47*  --   CALCIUM 9.0  --   --  9.1  --   < > = values in this interval not displayed.  Lipid Panel:     Component Value Date/Time   CHOL 226 (H) 03/09/2017 0408   TRIG 85 03/09/2017 0408   HDL 74 03/09/2017 0408   CHOLHDL 3.1 03/09/2017 0408   VLDL 17 03/09/2017 0408   LDLCALC 135 (H) 03/09/2017 0408   HgbA1c:  Lab Results  Component Value Date   HGBA1C <4.2 (L) 03/09/2017   Urine Drug Screen:     Component Value Date/Time   LABOPIA POSITIVE (A) 03/09/2017 0930   COCAINSCRNUR POSITIVE (A) 03/09/2017 0930   COCAINSCRNUR (A) 12/09/2007 0230    POSITIVE (NOTE) Result repeated and verified. Sent for confirmatory testing   LABBENZ POSITIVE (A) 03/09/2017 0930   LABBENZ NEGATIVE 12/09/2007 0230   AMPHETMU NONE DETECTED 03/09/2017 0930   THCU NONE DETECTED 03/09/2017 0930   LABBARB NONE DETECTED 03/09/2017 0930    Alcohol Level      Component Value Date/Time   ETH <5 10/06/2016 7654    IMAGING I have personally reviewed the radiological images below and agree with the radiology interpretations.  [name] 03/08/2017 1. Truncated examination due to patient refusal to complete all sequences. 2. 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 The basal cisterns and fourth ventricle remain patent without obstructive hydrocephalus. No acute hemorrhage. 3. Small left occipital acute infarct. 4. Chronic microvascular ischemia and scattered punctate foci of chronic microhemorrhage.   Ct Head Wo Contrast 03/10/2017 IMPRESSION: 1. Continued moderate to severe left cerebellar edema with interval mildly increased mass effect on the fourth ventricle and early lateral ventriculomegaly - mild enlargement of the temporal horn since yesterday but no transependymal edema is evident. 2. No hemorrhagic transformation or new intracranial abnormality.   LE venous doppler Positive for acute deep vein thrombosis involving the right peroneal veins and a short segment of the right posterior  tibial veins at the proximal and mid level, and thrombus in the proximal muscular calf gastro veins.  TTE pending  CTA head and neck pending   PHYSICAL EXAM  Temp:  [98.2 F (36.8 C)-99.1 F (37.3 C)] 99.1 F (37.3 C) (04/26 1200) Pulse Rate:  [63-97] 92 (04/26 1500) Resp:  [12-28] 23 (04/26 1500) BP: (145-188)/(90-129) 175/98 (04/26 1600) SpO2:  [95 %-100 %] 95 % (04/26 0950)  General - Well nourished, well developed, mild frontal headache.  Ophthalmologic - Fundi not visualized due to noncooperation.  Cardiovascular - Regular rate and rhythm.  Neuro - awake alert, orientated to place and time, follows commands. No aphasia, mild dysarthria. Left gaze nystagmus, left eye abduction not complete, PERRL, facial symmetrical, tongue midline.  Moving BUE and BLE equally, DTR 1+, mild dysmetria on the left UE. Sensation symmetrical, gait not tested.    ASSESSMENT/PLAN [name] is a 61 y.o. male with history of HTN, CKD, cocaine use and mental disorder presenting with severe HA, nausea, vomiting, dizziness. He did not receive IV t-PA due to delay in arrival.   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 Thrombotic due to cocaine use  Resultant  Nystagmus and left eye abduction deficit, left UE mild dysmetria  NS consulted (Pool). ICU monitoring with f/u imaging for hydrocephalus eval  MRI  Truncated d/t pt refusal. Large L PICA infarct w/ cytotoxic edea and Rightward shift. No hydrocephalus. No hmg. Small L occipital infarct.   Repeat CT head mild hydrocephalus  LE venous Doppler  Right LE DVT  2D Echo  pending  CTA head and neck pending  Consider outpt 30 day cardiac event monitoring to rule out afib as outpt  UDS - positive cocaine, opiates and benzo  LDL 135  HgbA1c < 4.2  Lovenox 40 mg sq daily for VTE prophylaxis  Diet Heart Room service appropriate? Yes; Fluid consistency: Thin.   No antithrombotic prior to admission, now on aspirin 325 mg daily.   Patient counseled to be compliant with his antithrombotic medications  Ongoing aggressive stroke risk factor management  Therapy recommendations:  CIR   Disposition:  Pending  Cerebellar edema / hydrocephalus  NSG on board  Repeat CT mild hydrocephalus  Close neuro check  On 3% saline, increase to 75cc/h  Na 139, not at goal  Will give 23.4% once  Right LE DVT  Not candidate for anticoagulation  IVC filter placed  ? Alcohol withdraw  Pt admitted drinking alcohol  Not sure the amount  On CIWA protocol  B1/FA/MVI  Hypertension  BP 160-170s on arrival  On amlodipine, clonidine, HCTZ and metoprolol at home  Hydralazine PRN for BP control  Permissive  hypertension (OK if < 180/105) but gradually normalize in 5-7 days  Long-term BP goal normotensive  Hyperlipidemia  Home meds:  No statin  LDL 135, goal < 70  Add lipitor 40mg   Cocaine abuse  UDS positive cocaine  Cessation counseling will be provided  Tobacco abuse  Current smoker  Smoking cessation counseling provided  Pt is willing to quit  Other Stroke Risk Factors     Other Active Problems  CKD Cre 1.52->1.47  Agitation - On zoloft  Hospital day # 1  This patient is critically ill due to large cerebellar infarct, cocaine use and at significant risk of neurological worsening, death form recurrent stroke, hydrocephalus, cerebral edema, heart failure, MI. This patient's care requires constant monitoring of  vital signs, hemodynamics, respiratory and cardiac monitoring, review of multiple databases, neurological assessment, discussion with family, other specialists and medical decision making of high complexity. I spent 40 minutes of neurocritical care time in the care of this patient.  Rosalin Hawking, MD PhD Stroke Neurology 03/10/2017 5:37 PM   To contact Stroke Continuity provider, please refer to [email or web address] After hours, contact General Neurology

## 2017-03-10 NOTE — Progress Notes (Signed)
PT Cancellation Note  Patient Details Name: Darryl Parsons MRN: [id number] DOB: 07-02-1956   Cancelled Treatment:    Reason Eval/Treat Not Completed: Patient at procedure or test/unavailable. Will follow-up for PT treatment when available.  Enis Gash, SPT Office-[phone]  [name] 03/10/2017, 8:54 AM

## 2017-03-11 ENCOUNTER — Encounter (HOSPITAL_COMMUNITY): Payer: Self-pay | Admitting: Acute Care

## 2017-03-11 ENCOUNTER — Inpatient Hospital Stay (HOSPITAL_COMMUNITY): Payer: Medicaid Other

## 2017-03-11 DIAGNOSIS — I82409 Acute embolism and thrombosis of unspecified deep veins of unspecified lower extremity: Secondary | ICD-10-CM

## 2017-03-11 DIAGNOSIS — R410 Disorientation, unspecified: Secondary | ICD-10-CM

## 2017-03-11 DIAGNOSIS — R42 Dizziness and giddiness: Secondary | ICD-10-CM

## 2017-03-11 DIAGNOSIS — G919 Hydrocephalus, unspecified: Secondary | ICD-10-CM

## 2017-03-11 DIAGNOSIS — O2230 Deep phlebothrombosis in pregnancy, unspecified trimester: Secondary | ICD-10-CM

## 2017-03-11 LAB — BASIC METABOLIC PANEL
Anion gap: 5 (ref 5–15)
Anion gap: 6 (ref 5–15)
BUN: 11 mg/dL (ref 6–20)
BUN: 10 mg/dL (ref 6–20)
CALCIUM: 8.5 mg/dL — AB (ref 8.9–10.3)
CALCIUM: 8.5 mg/dL — AB (ref 8.9–10.3)
CO2: 18 mmol/L — ABNORMAL LOW (ref 22–32)
CO2: 18 mmol/L — ABNORMAL LOW (ref 22–32)
CREATININE: 1.36 mg/dL — AB (ref 0.61–1.24)
CREATININE: 1.4 mg/dL — AB (ref 0.61–1.24)
Chloride: 117 mmol/L — ABNORMAL HIGH (ref 101–111)
Chloride: 116 mmol/L — ABNORMAL HIGH (ref 101–111)
GFR calc Af Amer: >60 mL/min (ref >60)
GFR calc Af Amer: >60 mL/min (ref >60)
GFR calc non Af Amer: 55 mL/min — ABNORMAL LOW (ref >60)
GFR calc non Af Amer: 54 mL/min — ABNORMAL LOW (ref >60)
GLUCOSE: 112 mg/dL — AB (ref 65–99)
GLUCOSE: 115 mg/dL — AB (ref 65–99)
Potassium: 7.2 mmol/L (ref 3.5–5.1)
Potassium: >7.5 mmol/L (ref 3.5–5.1)
Sodium: 140 mmol/L (ref 135–145)
Sodium: 140 mmol/L (ref 135–145)

## 2017-03-11 LAB — CBC
HEMATOCRIT: 43.6 % (ref 39.0–52.0)
Hemoglobin: 14.2 g/dL (ref 13.0–17.0)
MCH: 27 pg (ref 26.0–34.0)
MCHC: 32.6 g/dL (ref 30.0–36.0)
MCV: 82.9 fL (ref 78.0–100.0)
Platelets: 161 10*3/uL (ref 150–400)
RBC: 5.26 MIL/uL (ref 4.22–5.81)
RDW: 15.1 % (ref 11.5–15.5)
WBC: 12.8 10*3/uL — AB (ref 4.0–10.5)

## 2017-03-11 LAB — SODIUM
Sodium: 144 mmol/L (ref 135–145)
Sodium: 145 mmol/L (ref 135–145)

## 2017-03-11 MED ORDER — MIDAZOLAM HCL 2 MG/2ML IJ SOLN
2.0000 mg | INTRAMUSCULAR | Status: DC | PRN
  Administered 2017-03-11 – 2017-03-13 (×11): 2 mg via INTRAVENOUS
  Filled 2017-03-11 (×11): qty 2

## 2017-03-11 MED ORDER — HALOPERIDOL LACTATE 5 MG/ML IJ SOLN
INTRAMUSCULAR | Status: AC
  Filled 2017-03-11: qty 1

## 2017-03-11 MED ORDER — HALOPERIDOL LACTATE 5 MG/ML IJ SOLN
5.0000 mg | Freq: Once | INTRAMUSCULAR | Status: AC
  Administered 2017-03-11: 5 mg via INTRAVENOUS

## 2017-03-11 MED ORDER — QUETIAPINE FUMARATE 300 MG PO TABS
300.0000 mg | ORAL_TABLET | Freq: Every day | ORAL | Status: DC
Start: 2017-03-11 — End: 2017-03-20
  Administered 2017-03-11 – 2017-03-15 (×4): 300 mg via ORAL
  Administered 2017-03-17: 50 mg via ORAL
  Administered 2017-03-18: 300 mg via ORAL
  Filled 2017-03-11 (×3): qty 1
  Filled 2017-03-11: qty 3
  Filled 2017-03-11 (×3): qty 1
  Filled 2017-03-11: qty 6
  Filled 2017-03-11: qty 3

## 2017-03-11 MED ORDER — SODIUM CHLORIDE 0.9 % IV SOLN
0.4000 ug/kg/h | INTRAVENOUS | Status: DC
  Administered 2017-03-11: 0.4 ug/kg/h via INTRAVENOUS
  Administered 2017-03-11: 0.6 ug/kg/h via INTRAVENOUS
  Administered 2017-03-11: 0.4 ug/kg/h via INTRAVENOUS
  Filled 2017-03-11 (×3): qty 2

## 2017-03-11 MED ORDER — SODIUM CHLORIDE 0.9% FLUSH
10.0000 mL | Freq: Two times a day (BID) | INTRAVENOUS | Status: DC
  Administered 2017-03-11 – 2017-03-13 (×5): 10 mL
  Administered 2017-03-13: 30 mL
  Administered 2017-03-14 – 2017-03-15 (×2): 10 mL

## 2017-03-11 MED ORDER — IOPAMIDOL (ISOVUE-370) INJECTION 76%
INTRAVENOUS | Status: AC
  Administered 2017-03-11: 50 mL
  Filled 2017-03-11: qty 50

## 2017-03-11 MED ORDER — CHLORHEXIDINE GLUCONATE CLOTH 2 % EX PADS
6.0000 | MEDICATED_PAD | Freq: Every day | CUTANEOUS | Status: DC
  Administered 2017-03-11 – 2017-03-17 (×6): 6 via TOPICAL

## 2017-03-11 MED ORDER — SODIUM CHLORIDE 0.9% FLUSH: 10.0000 mL | INTRAVENOUS | Status: DC | PRN

## 2017-03-11 MED ORDER — DEXMEDETOMIDINE HCL IN NACL 400 MCG/100ML IV SOLN
0.4000 ug/kg/h | INTRAVENOUS | Status: DC
  Administered 2017-03-11: 0.9 ug/kg/h via INTRAVENOUS
  Administered 2017-03-12: 0.5 ug/kg/h via INTRAVENOUS
  Administered 2017-03-12: 1.2 ug/kg/h via INTRAVENOUS
  Administered 2017-03-12: 0.9 ug/kg/h via INTRAVENOUS
  Administered 2017-03-13: 0.4 ug/kg/h via INTRAVENOUS
  Administered 2017-03-13: 0.8 ug/kg/h via INTRAVENOUS
  Administered 2017-03-13 – 2017-03-14 (×2): 0.6 ug/kg/h via INTRAVENOUS
  Administered 2017-03-14: 0.8 ug/kg/h via INTRAVENOUS
  Administered 2017-03-14: 1.2 ug/kg/h via INTRAVENOUS
  Administered 2017-03-15: 0.6 ug/kg/h via INTRAVENOUS
  Filled 2017-03-11 (×13): qty 100

## 2017-03-11 NOTE — Progress Notes (Signed)
OT Cancellation Note  Patient Details Name: Darryl Parsons MRN: [id number] DOB: November 24, 1955   Cancelled Treatment:    Reason Eval/Treat Not Completed: Medical issues which prohibited therapy.  Pt given Haldol and ativan after episode of delirium and agitation this morning with RN- became combative and hallucinating. Will follow up when appropriate.  Karlen Barbar [place], OTR/L [phone]    Lucille Passy M 03/11/2017, 10:41 AM

## 2017-03-11 NOTE — Progress Notes (Signed)
STROKE TEAM PROGRESS NOTE   SUBJECTIVE (INTERVAL HISTORY) Patient had agitation and delirium after CT repeat this am. CT showed no hydrocephalus or hemorrhage. Na 140 this am. Pt was given ativan and haldol, symptoms getting better but still needs sitter. Discussed with CCM and recommend precedex. Concerning for underlying psychosis condision vs. alcohol withdraw at this time.      OBJECTIVE Temp:  [97.9 F (36.6 C)-98.9 F (37.2 C)] 98.2 F (36.8 C) (04/27 0400) Pulse Rate:  [44-160] 160 (04/27 1142) Cardiac Rhythm: Sinus tachycardia (04/27 0000) Resp:  [14-36] 18 (04/27 1200) BP: (131-197)/(95-150) 185/150 (04/27 1200) SpO2:  [89 %-100 %] 100 % (04/27 0900)  CBC:   Recent Labs Lab 03/08/17 2105  03/10/17 0549 03/11/17 0600  WBC 10.5  < > 11.4* 12.8*  NEUTROABS 8.5*  --   --   --   HGB 14.4  < > 15.2 14.2  HCT 42.5  < > 45.6 43.6  MCV 82.7  < > 82.0 82.9  PLT 218  < > 245 161  < > = values in this interval not displayed.  Basic Metabolic Panel:  Recent Labs Lab 03/10/17 0549  03/10/17 2257 03/11/17 0855  NA 137  < > 143 140  K 3.2*  --   --  7.2*  CL 107  --   --  117*  CO2 22  --   --  18*  GLUCOSE 113*  --   --  112*  BUN 14  --   --  11  CREATININE 1.47*  --   --  1.36*  CALCIUM 9.1  --   --  8.5*  < > = values in this interval not displayed.  Lipid Panel:     Component Value Date/Time   CHOL 226 (H) 03/09/2017 0408   TRIG 85 03/09/2017 0408   HDL 74 03/09/2017 0408   CHOLHDL 3.1 03/09/2017 0408   VLDL 17 03/09/2017 0408   LDLCALC 135 (H) 03/09/2017 0408   HgbA1c:  Lab Results  Component Value Date   HGBA1C <4.2 (L) 03/09/2017   Urine Drug Screen:     Component Value Date/Time   LABOPIA POSITIVE (A) 03/09/2017 0930   COCAINSCRNUR POSITIVE (A) 03/09/2017 0930   COCAINSCRNUR (A) 12/09/2007 0230    POSITIVE (NOTE) Result repeated and verified. Sent for confirmatory testing   LABBENZ POSITIVE (A) 03/09/2017 0930   LABBENZ NEGATIVE 12/09/2007  0230   AMPHETMU NONE DETECTED 03/09/2017 0930   THCU NONE DETECTED 03/09/2017 0930   LABBARB NONE DETECTED 03/09/2017 0930    Alcohol Level     Component Value Date/Time   ETH <5 10/06/2016 8676    IMAGING I have personally reviewed the radiological images below and agree with the radiology interpretations.  [name] 03/08/2017 1. Truncated examination due to patient refusal to complete all sequences.  2. 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 The basal cisterns and fourth ventricle remain patent without obstructive hydrocephalus. No acute hemorrhage.  3. Small left occipital acute infarct.  4. Chronic microvascular ischemia and scattered punctate foci of chronic microhemorrhage.   Ct Head Wo Contrast 03/10/2017 1. Continued moderate to severe left cerebellar edema with interval mildly increased mass effect on the fourth ventricle and early lateral ventriculomegaly - mild enlargement of the temporal horn since yesterday but no transependymal edema is evident.  2. No hemorrhagic transformation or new intracranial abnormality.  LE venous doppler - Positive for acute deep vein thrombosis involving the right peroneal veins and a short segment of the right posterior tibial veins at the proximal and mid level, and thrombus in the proximal muscular calf gastro veins.  TTE - pt not cooperative due to delirium  CTA Head and Neck  03/11/2017 1. 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 2. Left AICA is not clearly visualized, but the AICA origin and caliber are highly variable and it is often not identified on scans of normal patients. 3. Unchanged left cerebellar cytotoxic edema with mass effect on the fourth ventricle and basal cisterns. Crowding of the foramen magnum is also unchanged. 4. No hemorrhage or  hydrocephalus.   PHYSICAL EXAM  Temp:  [97.9 F (36.6 C)-98.9 F (37.2 C)] 98.2 F (36.8 C) (04/27 0400) Pulse Rate:  [44-160] 160 (04/27 1142) Resp:  [14-36] 18 (04/27 1200) BP: (131-197)/(95-150) 185/150 (04/27 1200) SpO2:  [89 %-100 %] 100 % (04/27 0900)  General - Well nourished, well developed, delirium with agitation and confusion.  Ophthalmologic - Fundi not visualized due to noncooperation.  Cardiovascular - Regular rate and rhythm.  Neuro - exam limited due to delirium with agitation and confusion. Dysarthric and not orientated. PERRL, facial symmetrical, tongue midline. Moving BUE and BLE equally, DTR 1+. Sensation symmetrical, gait not tested.    ASSESSMENT/PLAN Darryl Parsons is a 60 y.o. male with history of HTN, CKD, cocaine use and mental disorder presenting with severe HA, nausea, vomiting, dizziness. He did not receive IV t-PA due to delay in arrival.   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 Thrombotic due to cocaine use  Resultant  Nystagmus and left eye abduction deficit, left UE mild dysmetria  NS consulted (Pool). ICU monitoring with f/u imaging for hydrocephalus eval  MRI  Truncated d/t pt refusal. Large L PICA infarct w/ cytotoxic edea and Rightward shift. No hydrocephalus. No hmg. Small L occipital infarct.   Repeat CT head mild hydrocephalus  LE venous Doppler  Right LE DVT  2D Echo  pending  CTA head and neck - Non-opacification of the left PICA, in keeping with acute left PICA territory  infarct.  Consider outpt 30 day cardiac event monitoring to rule out afib as outpt  UDS - positive cocaine, opiates and benzo  LDL 135  HgbA1c < 4.2  Lovenox 40 mg sq daily for VTE prophylaxis  Diet Heart Room service appropriate? Yes; Fluid consistency: Thin.   No antithrombotic prior to admission, now on aspirin 325 mg daily.   Patient counseled to be compliant with his antithrombotic medications  Ongoing aggressive stroke risk factor management  Therapy recommendations:  CIR   Disposition:  Pending  Cerebellar edema / hydrocephalus  NSG on board - no intervention needed at this time  Repeat CT no significant hydrocephalus  Close neuro check  On 3% saline, increase to 75cc/h  Na 139 -> 143 -> 140  Had 23.4% once last night  Right LE DVT  Not candidate for anticoagulation  IVC filter placed  ? Alcohol withdraw  Pt admitted drinking alcohol  Not sure the amount and time of last drink  On CIWA protocol  B1/FA/MVI  Hypertension  BP 160-170s on arrival  On amlodipine, clonidine, HCTZ and metoprolol at home  Hydralazine PRN for BP control  Permissive hypertension (OK if < 180/105) but gradually normalize in 5-7 days  Long-term BP goal normotensive  Hyperlipidemia  Home meds:  No statin  LDL 135, goal < 70  Add lipitor 40mg   Cocaine abuse  UDS positive cocaine  Cessation counseling will be provided  Tobacco abuse  Current smoker  Smoking cessation counseling provided  Pt is willing to quit  Other Stroke Risk Factors     Other Active Problems  CKD Cre 1.52->1.47 -> 1.36  Agitation and depression - On zoloft  Psychosis - on seroquel   Mild leukocytosis - 12.8   Hyperkalemia - 7.2 (hemolysis) - repeat Bmet pending  Hospital day # 2  This patient is critically ill due to large cerebellar infarct, cocaine use, delirium, agitation, alcohol withdraw and at significant risk of neurological worsening, death form recurrent  stroke, hydrocephalus, cerebral edema, heart failure, MI, and DT. This patient's care requires constant monitoring of vital signs, hemodynamics, respiratory and cardiac monitoring, review of multiple databases, neurological assessment, discussion with family, other specialists and medical decision making of high complexity. I spent 50 minutes of neurocritical care time in the care of this patient.  Rosalin Hawking, MD PhD Stroke Neurology 03/11/2017 2:04 PM    To contact Stroke Continuity provider, please refer to [email or web address] After hours, contact General Neurology

## 2017-03-11 NOTE — Care Management Note (Signed)
Case Management Note  Patient Details  Name: Darryl Parsons MRN: [id number] Date of Birth: [birth year]  Subjective/Objective:   Pt is a 61 y.o. male admitted to ED on 03/08/17 with severe headache, dizziness, nausea and vomiting. MRI on 4/24 showed large acute infarct of L PICA and small L occipital acute infarct.  PTA, pt independent, lives with significant other.                  Action/Plan: IVC filter placed 03/10/17.  PT/OT recommending CIR upon medical stability; will follow progress.    Expected Discharge Date:                  Expected Discharge Plan:  [place]  In-House Referral:     Discharge planning Services  CM Consult  Post Acute Care Choice:    Choice offered to:     DME Arranged:    DME Agency:     HH Arranged:    [place] Agency:     Status of Service:  In process, will continue to follow  If discussed at Long Length of Stay Meetings, dates discussed:    Additional Comments:  Ella Bodo, RN 03/11/2017, 5:18 PM

## 2017-03-11 NOTE — Progress Notes (Signed)
PT Cancellation Note  Patient Details Name: Darryl Parsons MRN: [id number] DOB: [birth year]   Cancelled Treatment:    Reason Eval/Treat Not Completed: Patient's level of consciousness;Other (comment) Pt given Haldol and ativan after episode of delirium and agitation this morning with RN- became combative and hallucinating. Will follow up when appropriate.    Marguarite Arbour A Shakema Surita 03/11/2017, 8:50 AM Wray Kearns, [place], DPT [phone]

## 2017-03-11 NOTE — Consult Note (Signed)
PULMONARY / CRITICAL CARE MEDICINE   Name: Darryl Parsons MRN: [id number] DOB: 1956-04-23    ADMISSION DATE:  03/08/2017 CONSULTATION DATE:  4/27  REFERRING MD:  Erlinda Hong  CHIEF COMPLAINT:  Agitated delirium w/ concern for ETOH w/d  HISTORY OF PRESENT ILLNESS:   This is a 61 year old male who presented to the ER on 4/25 w/ cc: severe HA, dizziness, N&V. Dx eval demonstrated: large acute left PICA territory/cerebellar infarct w/ associated cerebral edema w/ some associated mid-line shift.  ->has h/o cocaine abuse (including the night prior)raising concern that cocaine induced vasospasm may have precipitated the event.  Hospital course  4/25 admitted to NICU (did not get TPA d/t being outside window. Placed on CIWA protocol. Started on 3% saline. Allowing for permissive HTN (goal <220/120. Venous US obtained: + DVT RLE.  4/26 IVC filter placed. PCCM asked to place CVL for 3% 4/27 increased agitation. Hitting at staff. CT unchanged from earlier f/u CT day prior. PCCM asked to assess for precedex   PAST MEDICAL HISTORY :  He  has a past medical history of Chronic kidney disease; Headache; Hypertension; Low back pain; and Mental disorder.  PAST SURGICAL HISTORY: He  has a past surgical history that includes IR IVC FILTER PLMT / S&I /IMG GUID/MOD SED (03/10/2017).  Allergies  Allergen Reactions  . Other     Pt states he is allergic to a blood pressure pill. He is unable to state reaction. He is spelling it adelatt and aderlatt.    No current facility-administered medications on file prior to encounter.    Current Outpatient Prescriptions on File Prior to Encounter  Medication Sig  . amLODipine (NORVASC) 10 MG tablet Take 1 tablet (10 mg total) by mouth daily.  . hydrochlorothiazide (MICROZIDE) 12.5 MG capsule Take 1 capsule (12.5 mg total) by mouth daily.  . QUEtiapine (SEROQUEL) 300 MG tablet Take 1 tablet (300 mg total) by mouth at bedtime.  . sertraline (ZOLOFT) 50 MG tablet Take 1  tablet (50 mg total) by mouth daily.  . cloNIDine (CATAPRES - DOSED IN MG/24 HR) 0.2 mg/24hr patch Place 1 patch (0.2 mg total) onto the skin once a week. (Patient not taking: Reported on 03/08/2017)  . hydrOXYzine (ATARAX/VISTARIL) 25 MG tablet Take 1 tablet (25 mg total) by mouth every 6 (six) hours as needed for anxiety (or CIWA score </= 10).  . metoprolol tartrate (LOPRESSOR) 100 MG tablet Take 1 tablet (100 mg total) by mouth 2 (two) times daily. (Patient not taking: Reported on 03/08/2017)  . thiamine 100 MG tablet Take 1 tablet (100 mg total) by mouth daily. (Patient not taking: Reported on 03/08/2017)    FAMILY HISTORY:  His indicated that his mother is alive. He indicated that only one of his two sisters is alive. He indicated that the status of his neg hx is unknown.    SOCIAL HISTORY: He  reports that he has been smoking Cigarettes.  He has been smoking about 0.50 packs per day. He has never used smokeless tobacco. He reports that he drinks alcohol. He reports that he uses drugs, including Cocaine.  REVIEW OF SYSTEMS:   Unable   SUBJECTIVE:  Currently resting. Easily agitated  Moves all extremities   VITAL SIGNS: BP (!) 177/100   Pulse (!) 112   Temp 98.2 F (36.8 C) (Oral)   Resp 16   Ht 5\' 8"  (1.727 m)   Wt 189 lb 6 oz (85.9 kg)   SpO2 100%   BMI  28.79 kg/m   INTAKE / OUTPUT: I/O last 3 completed shifts: In: 3297.9 [P.O.:920; I.V.:2377.9] Out: 2650 [Urine:2650]  General appearance:  61 Year old  Male, well nourished NAD, conversant at times but has visual hallucinations "seeing zombies" Eyes: anicteric sclerae, moist conjunctivae; PERRL, EOMI bilaterally. Mouth:  membranes and no mucosal ulcerations; normal hard and soft palate Neck: Trachea midline; neck supple, no JVD Lungs/chest: CTA, with normal respiratory effort and no intercostal retractions CV: RRR, no MRGs  Abdomen: Soft, non-tender; no masses or HSM Extremities: No peripheral edema or extremity  lymphadenopathy Skin: Normal temperature, turgor and texture; no rash, ulcers or subcutaneous nodules Neuro/Psych: intermittently agitated. Confused. Moves all extremities. Will hit at staff. Very impulsive. Has bedside sitter for safety  LABS:  BMET  Recent Labs Lab 03/08/17 2105 03/09/17 0408  03/10/17 0549  03/10/17 1645 03/10/17 2257 03/11/17 0855  NA 138  --   < > 137  < > 135 143 140  K 3.5  --   --  3.2*  --   --   --  7.2*  CL 105  --   --  107  --   --   --  117*  CO2 23  --   --  22  --   --   --  18*  BUN 17  --   --  14  --   --   --  11  CREATININE 1.52* 1.54*  --  1.47*  --   --   --  1.36*  GLUCOSE 104*  --   --  113*  --   --   --  112*  < > = values in this interval not displayed.  Electrolytes  Recent Labs Lab 03/08/17 2105 03/10/17 0549 03/11/17 0855  CALCIUM 9.0 9.1 8.5*    CBC  Recent Labs Lab 03/09/17 0408 03/10/17 0549 03/11/17 0600  WBC 11.0* 11.4* 12.8*  HGB 14.3 15.2 14.2  HCT 42.5 45.6 43.6  PLT 240 245 161    Coag's  Recent Labs Lab 03/10/17 0803  INR 0.98    Sepsis Markers No results for input(s): LATICACIDVEN, PROCALCITON, O2SATVEN in the last 168 hours.  ABG No results for input(s): PHART, PCO2ART, PO2ART in the last 168 hours.  Liver Enzymes  Recent Labs Lab 03/08/17 2105  AST 30  ALT 14*  ALKPHOS 72  BILITOT 0.7  ALBUMIN 4.1    Cardiac Enzymes No results for input(s): TROPONINI, PROBNP in the last 168 hours.  Glucose  Recent Labs Lab 03/08/17 2100  GLUCAP 108*    Imaging Ct Angio Head W Or Wo Contrast  Result Date: 03/11/2017 CLINICAL DATA:  Follow-up CVA EXAM: CT ANGIOGRAPHY HEAD AND NECK TECHNIQUE: Multidetector CT imaging of the head and neck was performed using the standard protocol during bolus administration of intravenous contrast. Multiplanar CT image reconstructions and MIPs were obtained to evaluate the vascular anatomy. Carotid stenosis measurements (when applicable) are obtained  utilizing NASCET criteria, using the distal internal carotid diameter as the denominator. CONTRAST:  50 mL Isovue 370 COMPARISON:  Head CT 03/10/2017 FINDINGS: CTA NECK FINDINGS Aortic arch: There is no aneurysm or dissection of the visualized ascending aorta or aortic arch. There is a normal 3 vessel branching pattern. The visualized proximal subclavian arteries are normal. Right carotid system: The right common carotid origin is widely patent. There is no common carotid or internal carotid artery dissection or aneurysm. No hemodynamically significant stenosis. Left carotid system: The left common carotid origin  is widely patent. There is no common carotid or internal carotid artery dissection or aneurysm. No hemodynamically significant stenosis. Vertebral arteries: The vertebral system is codominant. Both vertebral artery origins are normal. Both vertebral arteries are normal to their confluence with the basilar artery. Skeleton: There is no bony spinal canal stenosis. No lytic or blastic lesions. Other neck: The nasopharynx is clear. The oropharynx and hypopharynx are normal. The epiglottis is normal. The supraglottic larynx, glottis and subglottic larynx are normal. No retropharyngeal collection. The parapharyngeal spaces are preserved. The parotid and submandibular glands are normal. No sialolithiasis or salivary ductal dilatation. The thyroid gland is normal. There is no cervical lymphadenopathy. Upper chest: No pneumothorax or pleural effusion. No nodules or masses. Review of the MIP images confirms the above findings CTA HEAD FINDINGS Anterior circulation: --Intracranial internal carotid arteries: Normal. --Anterior cerebral arteries: Normal. --Middle cerebral arteries: Normal. --Posterior communicating arteries: Absent bilaterally. Posterior circulation: --Posterior cerebral arteries: Normal. --Superior cerebellar arteries: Normal. --Basilar artery: Normal. --Anterior inferior cerebellar arteries: Normal on  the right. Not clearly seen on the left. --Posterior inferior cerebellar arteries: Normal on the right but not visualized on the left. Venous sinuses: As permitted by contrast timing, patent. Anatomic variants: None Delayed phase: No parenchymal contrast enhancement. Area of cytotoxic edema in the left PICA distribution is unchanged in size, with persistent narrowing of the fourth ventricle and basal cisterns. The sizes of the lateral ventricle temporal horns are unchanged. No hemorrhage. Unchanged crowding of the foramen magnum. Review of the MIP images confirms the above findings IMPRESSION: 1. 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 2. Left AICA is not clearly visualized, but the AICA origin and caliber are highly variable and it is often not identified on scans of normal patients. 3. Unchanged left cerebellar cytotoxic edema with mass effect on the fourth ventricle and basal cisterns. Crowding of the foramen magnum is also unchanged. 4. No hemorrhage or hydrocephalus. Electronically Signed   By: Ulyses Jarred M.D.   On: 03/11/2017 06:34   Ct Angio Neck W Or Wo Contrast  Result Date: 03/11/2017 CLINICAL DATA:  Follow-up CVA EXAM: CT ANGIOGRAPHY HEAD AND NECK TECHNIQUE: Multidetector CT imaging of the head and neck was performed using the standard protocol during bolus administration of intravenous contrast. Multiplanar CT image reconstructions and MIPs were obtained to evaluate the vascular anatomy. Carotid stenosis measurements (when applicable) are obtained utilizing NASCET criteria, using the distal internal carotid diameter as the denominator. CONTRAST:  50 mL Isovue 370 COMPARISON:  Head CT 03/10/2017 FINDINGS: CTA NECK FINDINGS Aortic arch: There is no aneurysm or dissection of the visualized ascending aorta or aortic arch. There is a normal 3 vessel branching pattern. The visualized proximal subclavian arteries are normal. Right carotid system: The right  common carotid origin is widely patent. There is no common carotid or internal carotid artery dissection or aneurysm. No hemodynamically significant stenosis. Left carotid system: The left common carotid origin is widely patent. There is no common carotid or internal carotid artery dissection or aneurysm. No hemodynamically significant stenosis. Vertebral arteries: The vertebral system is codominant. Both vertebral artery origins are normal. Both vertebral arteries are normal to their confluence with the basilar artery. Skeleton: There is no bony spinal canal stenosis. No lytic or blastic lesions. Other neck: The nasopharynx is clear. The oropharynx and hypopharynx are normal. The epiglottis is normal. The supraglottic larynx, glottis and subglottic larynx are normal. No retropharyngeal collection. The parapharyngeal spaces are preserved. The  parotid and submandibular glands are normal. No sialolithiasis or salivary ductal dilatation. The thyroid gland is normal. There is no cervical lymphadenopathy. Upper chest: No pneumothorax or pleural effusion. No nodules or masses. Review of the MIP images confirms the above findings CTA HEAD FINDINGS Anterior circulation: --Intracranial internal carotid arteries: Normal. --Anterior cerebral arteries: Normal. --Middle cerebral arteries: Normal. --Posterior communicating arteries: Absent bilaterally. Posterior circulation: --Posterior cerebral arteries: Normal. --Superior cerebellar arteries: Normal. --Basilar artery: Normal. --Anterior inferior cerebellar arteries: Normal on the right. Not clearly seen on the left. --Posterior inferior cerebellar arteries: Normal on the right but not visualized on the left. Venous sinuses: As permitted by contrast timing, patent. Anatomic variants: None Delayed phase: No parenchymal contrast enhancement. Area of cytotoxic edema in the left PICA distribution is unchanged in size, with persistent narrowing of the fourth ventricle and basal  cisterns. The sizes of the lateral ventricle temporal horns are unchanged. No hemorrhage. Unchanged crowding of the foramen magnum. Review of the MIP images confirms the above findings IMPRESSION: 1. 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 2. Left AICA is not clearly visualized, but the AICA origin and caliber are highly variable and it is often not identified on scans of normal patients. 3. Unchanged left cerebellar cytotoxic edema with mass effect on the fourth ventricle and basal cisterns. Crowding of the foramen magnum is also unchanged. 4. No hemorrhage or hydrocephalus. Electronically Signed   By: Ulyses Jarred M.D.   On: 03/11/2017 06:34   Dg Chest Port 1 View  Result Date: 03/10/2017 CLINICAL DATA:  Central venous catheter placement. EXAM: PORTABLE CHEST 1 VIEW COMPARISON:  03/09/2017 and prior radiographs FINDINGS: Upper limits normal heart size, mediastinal fullness and elevation of the right hemidiaphragm are unchanged. A left IJ central venous catheter is noted with tip overlying the upper SVC. There is no evidence of focal airspace disease, pulmonary edema, suspicious pulmonary nodule/mass, pleural effusion, or pneumothorax. No acute bony abnormalities are identified. IMPRESSION: Left IJ central venous catheter with tip overlying the upper SVC. No evidence of pneumothorax. Electronically Signed   By: Margarette Canada M.D.   On: 03/10/2017 12:45     STUDIES:  MR brain 4/24: 1. Truncated examination due to patient refusal to complete all sequences.2. 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 The basal cisterns and fourth ventricle remain patent without obstructive hydrocephalus. No acute hemorrhage. 3. Small left occipital acute infarct. 4. Chronic microvascular ischemia and scattered punctate foci of chronic microhemorrhage. CT head 4/26: 1. Continued  moderate to severe left cerebellar edema with interval mildly increased mass effect on the fourth ventricle and early lateral ventriculomegaly - mild enlargement of the temporal horn since yesterday but no transependymal edema is evident. 2. No hemorrhagic transformation or new intracranial abnormality. 4/27: 1. 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 2. Left AICA is not clearly visualized, but the AICA origin and caliber are highly variable and it is often not identified on scans of normal patients.3. Unchanged left cerebellar cytotoxic edema with mass effect on the fourth ventricle and basal cisterns. Crowding of the foramen magnum is also unchanged.4. No hemorrhage or hydrocephalus.  CULTURES:   ANTIBIOTICS:   SIGNIFICANT EVENTS: 4/25 admitted w/ cerebellar stroke Started on 3% saline 4/26 IVC filter placed for DVT 4/27 PCCM asked to see for w/d  LINES/TUBES: Left IJ CVL 4/26>>>  DISCUSSION: Left  cerebellar stroke. Presume cocaine induced. Now w/ probably ETOH w/d. Will add precedex. PRN versed, BP goal <180/105, cont 3% protocol, f/u chemistries.   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 Source unclear ? Af vs cocaine related vasospasm Acute encephalopathy: presumed Delirium tremens  h/o substance abuse  P:   RASS goal: 0 to -1 precedex gtt Thiamine and folate Aspirin for stroke prevention  3% saline infusion protocol Serial imaging per neuro and neuro-surg services re: watching for obstructive hydrocephalus   Pulmonary  A: Elevated right HD P: Trend pulse ox   CARDIOVASCULAR A:  HTN Tachycardia  hyperlipidemia  P:  Allowing for permissive HTN <180/105 Cont tele  Will need to see how he does w/ precedex. Will be at risk for hypotension Cont lipitor   RENAL A:   CKD stage IV  Hyperchloremia  NAGMA Hyperkalemia ? Hemolysis  P:   Serial  chemistries Hypernatremia protocol   GASTROINTESTINAL A:   Aspiration risk P:   Aspiration precautions   HEMATOLOGIC A:   RLE DVT P:  IVC filter Trend CBC Eventually decide of systemic a/c     FAMILY  - Updates: pending   - Inter-disciplinary family meet or Palliative Care meeting due by:  5/2  My ccm time 35 minutes  Erick Colace ACNP-BC [place] Pager # [phone] OR # [phone] if no answer   03/11/2017, 11:11 AM   STAFF NOTE: I, Merrie Roof, MD FACP have personally reviewed patient's available data, including medical history, events of note, physical examination and test results as part of my evaluation. I have discussed with resident/NP and other care providers such as pharmacist, RN and RRT. In addition, I personally evaluated patient and elicited key findings of: awake, was standing up and was ataxic, watched him attempt to hit RN x 2, perrl 78mm, urinated on floor a few times just prior to my examination, lungs clear, CT reviewed, course noted as cocaine--> cerebellar ischemia (vasospasm?), with hydro risk and now drug WD agitation and on 3% for edema, given cocaine use we should use prn versed and start precedex, MAP goals are in range for acute cva for now, will have to follow his response to precedex, his K is hemolyzed and we will repeat it now, continued 3%, no lasix, follow na and nonag induced in am bmet, NO BB with risk cocaine induced unopposed alpha, filter in place for dvt, esnure thiamine, folic on board with presumed etoh use  The patient is critically ill with multiple organ systems failure and requires high complexity decision making for assessment and support, frequent evaluation and titration of therapies, application of advanced monitoring technologies and extensive interpretation of multiple databases.   Critical Care Time devoted to patient care services described in this note is 35 Minutes. This time reflects time of  care of this signee: Merrie Roof, MD FACP. This critical care time does not reflect procedure time, or teaching time or supervisory time of PA/NP/Med student/Med Resident etc but could involve care discussion time. Rest per NP/medical resident whose note is outlined above and that I agree with   Lavon Paganini. Titus Mould, MD, [place] Pgr: [facility] 03/11/2017 11:51 AM

## 2017-03-11 NOTE — Progress Notes (Signed)
Inpatient Rehabilitation  Continuing to follow for timing of medical readiness and bed availability.  I have attempted to reach out to patient's significant other [name]; however, the phone number on record is not her current number.  Please plan for my co-worker Gerlean Ren to follow up on Monday 03/14/17.     Carmelia Roller., CCC/SLP Admission Coordinator  [facility]  Cell [phone]

## 2017-03-11 NOTE — Progress Notes (Signed)
Late entry  Patient seen and examined yesterday morning. He was awake and alert. He was following commands readily. He denies headache.  Follow-up head CT scan yesterday morning demonstrated some mild increase in size of his lateral ventricles. Patient still with significant left circumflex LR infarct consistent with a complete PICA occlusion on the left.  No indication for surgical intervention at present. Patient hopefully will not require suboccipital decompression. Continue supportive efforts in ICU observation.

## 2017-03-11 NOTE — Plan of Care (Signed)
Called by RN due to pt agitation which seems started around 7am. Pt had CTA head and neck at 5:55am. Came back from CT at baseline, then started to stand up for urination and then had acute agitation, urinated on the floor, kicking and hitting staff. Security called. On my arrival, pt still agitated in bed, but delirium and not able to following commands. Gave ativan 1mg  iv at 741 and haldol 5mg  iv 815. Na this am has not been resulted. Last night 143 up from 135 after 23.4% saline and increased 3% saline to 75cc/h. Will wait for Na check. CT head showed no significant change from yesterday, no obvious hydrocephalus or hemorrhage. Etiology for delirium not quite clear this time, concerning for rapid sodium rise, alcohol withdraw, or CT contrast allergy?  Pt currently quite after medication, will continue to monitor.   Rosalin Hawking, MD PhD Stroke Neurology 03/11/2017 8:22 AM

## 2017-03-11 NOTE — Progress Notes (Signed)
Patient somewhat confused and agitated earlier today. Patient with history of psychosis on mood stabilizers chronically. Currently he awakens easily. He answers simple questions well. He denies headache. He is moving all 4 extremities. Follow-up head CT scan this morning demonstrates no progression in his very mild hydrocephalus.  Overall stable status post left PICA infarct. Continue ICU observation. No indication for surgical intervention at present.

## 2017-03-12 ENCOUNTER — Encounter (HOSPITAL_COMMUNITY): Payer: Self-pay | Admitting: *Deleted

## 2017-03-12 ENCOUNTER — Inpatient Hospital Stay (HOSPITAL_COMMUNITY): Payer: Medicaid Other

## 2017-03-12 DIAGNOSIS — I6789 Other cerebrovascular disease: Secondary | ICD-10-CM

## 2017-03-12 LAB — CBC
HCT: 37.7 % — ABNORMAL LOW (ref 39.0–52.0)
Hemoglobin: 12.2 g/dL — ABNORMAL LOW (ref 13.0–17.0)
MCH: 26.9 pg (ref 26.0–34.0)
MCHC: 32.4 g/dL (ref 30.0–36.0)
MCV: 83 fL (ref 78.0–100.0)
Platelets: 189 10*3/uL (ref 150–400)
RBC: 4.54 MIL/uL (ref 4.22–5.81)
RDW: 14.8 % (ref 11.5–15.5)
WBC: 10 10*3/uL (ref 4.0–10.5)

## 2017-03-12 LAB — BASIC METABOLIC PANEL
ANION GAP: 6 (ref 5–15)
BUN: 12 mg/dL (ref 6–20)
CALCIUM: 8.5 mg/dL — AB (ref 8.9–10.3)
CO2: 19 mmol/L — ABNORMAL LOW (ref 22–32)
CREATININE: 1.44 mg/dL — AB (ref 0.61–1.24)
Chloride: 121 mmol/L — ABNORMAL HIGH (ref 101–111)
GFR calc non Af Amer: 52 mL/min — ABNORMAL LOW (ref >60)
GFR, EST AFRICAN AMERICAN: 60 mL/min — AB (ref >60)
Glucose, Bld: 105 mg/dL — ABNORMAL HIGH (ref 65–99)
Potassium: 3.1 mmol/L — ABNORMAL LOW (ref 3.5–5.1)
SODIUM: 146 mmol/L — AB (ref 135–145)

## 2017-03-12 LAB — SODIUM
SODIUM: 144 mmol/L (ref 135–145)
Sodium: 147 mmol/L — ABNORMAL HIGH (ref 135–145)

## 2017-03-12 MED ORDER — DIAZEPAM 5 MG/ML IJ SOLN
10.0000 mg | Freq: Four times a day (QID) | INTRAMUSCULAR | Status: AC
  Administered 2017-03-12 (×4): 10 mg via INTRAVENOUS
  Filled 2017-03-12 (×4): qty 2

## 2017-03-12 MED ORDER — AMLODIPINE BESYLATE 10 MG PO TABS
10.0000 mg | ORAL_TABLET | Freq: Every day | ORAL | Status: DC
  Administered 2017-03-13 – 2017-03-20 (×8): 10 mg via ORAL
  Filled 2017-03-12 (×9): qty 1

## 2017-03-12 MED ORDER — DIAZEPAM 5 MG/ML IJ SOLN
5.0000 mg | Freq: Four times a day (QID) | INTRAMUSCULAR | Status: AC
  Administered 2017-03-12 – 2017-03-14 (×7): 5 mg via INTRAVENOUS
  Filled 2017-03-12 (×7): qty 2

## 2017-03-12 MED ORDER — POTASSIUM CHLORIDE CRYS ER 20 MEQ PO TBCR
40.0000 meq | EXTENDED_RELEASE_TABLET | Freq: Once | ORAL | Status: AC
  Administered 2017-03-12: 40 meq via ORAL
  Filled 2017-03-12: qty 2

## 2017-03-12 MED ORDER — CLONIDINE HCL 0.2 MG/24HR TD PTWK
0.2000 mg | MEDICATED_PATCH | TRANSDERMAL | Status: DC
  Administered 2017-03-12: 0.2 mg via TRANSDERMAL
  Filled 2017-03-12: qty 1

## 2017-03-12 NOTE — Progress Notes (Signed)
Pt seen and examined. Patient was threatening nursing staff overnight; now restrained.  EXAM: Temp:  [97.4 F (36.3 C)-100.2 F (37.9 C)] 97.4 F (36.3 C) (04/28 0800) Pulse Rate:  [64-160] 81 (04/28 0800) Resp:  [12-27] 18 (04/28 0800) BP: (104-185)/(87-150) 172/122 (04/28 0800) SpO2:  [92 %-100 %] 95 % (04/28 0800) Intake/Output      04/27 0701 - 04/28 0700 04/28 0701 - 04/29 0700   P.O. 600 480   I.V. (mL/kg) 2158 (25.1) 92.2 (1.1)   Total Intake(mL/kg) 2758 (32.1) 572.2 (6.7)   Urine (mL/kg/hr) 450 (0.2)    Total Output 450     Net +2308 +572.2        Urine Occurrence 2 x     Awake and alert, oriented x2 Follows commands throughout Normal strength  Stable Continue current care No indication for posterior fossa decompression at this time

## 2017-03-12 NOTE — Progress Notes (Signed)
Patient combative, confused,and hallucinating also taking leads off & pulling at central line. Sitter at bedside trying to keep him in the bed and he keeps swinging at her. Multiple staff members helped get patient back in the bed, posey restraint tied. Precedex was maxed out at 1.60mcg and versed given PRN q2 hours but only keeps patient calm for approx 1 hour before he's combative again. Patient took his PM seroquel dose earlier.  Notified Dr Cheral Marker for additional orders which were received and carried through. Will continue to monitor.

## 2017-03-12 NOTE — Progress Notes (Signed)
  Echocardiogram 2D Echocardiogram has been performed.  Darryl Parsons 03/12/2017, 6:17 PM

## 2017-03-12 NOTE — Progress Notes (Signed)
[facility] Progress Note Patient Name: Darryl Parsons DOB: August 12, 1956 MRN: [id number]   Date of Service  03/12/2017  HPI/Events of Note  Hypokalemia  eICU Interventions  Potassium replaced     Intervention Category Minor Interventions: Electrolytes abnormality - evaluation and management  Anabela Crayton 03/12/2017, 6:07 AM

## 2017-03-12 NOTE — Progress Notes (Signed)
PT Cancellation Note  Patient Details Name: Darryl Parsons MRN: [id number] DOB: May 07, 1956   Cancelled Treatment:    Reason Eval/Treat Not Completed: Medical issues which prohibited therapy.  Patient remains agitated, combative and is restrained.  Spoke with RN.  Will hold PT today.  Will return tomorrow for PT session if appropriate for patient.   Despina Pole 03/12/2017, 1:49 PM Carita Pian. Sanjuana Kava, [place] Pager [phone]

## 2017-03-12 NOTE — Progress Notes (Signed)
STROKE TEAM PROGRESS NOTE   SUBJECTIVE (INTERVAL HISTORY) Patient had agitation and delirium  And is nopw on Precedex and in restraints.  but still needs sitter. Discussed with CCM and resumed home seroquel for underlying psychosis condision vs. alcohol withdraw at this time.      OBJECTIVE Temp:  [97.4 F (36.3 C)-98.8 F (37.1 C)] 98.8 F (37.1 C) (04/28 1156) Pulse Rate:  [64-98] 73 (04/28 1500) Cardiac Rhythm: Normal sinus rhythm (04/28 1200) Resp:  [12-27] 18 (04/28 0800) BP: (104-186)/(87-122) 177/115 (04/28 1500) SpO2:  [92 %-100 %] 97 % (04/28 1500)  CBC:   Recent Labs Lab 03/08/17 2105  03/11/17 0600 03/12/17 0500  WBC 10.5  < > 12.8* 10.0  NEUTROABS 8.5*  --   --   --   HGB 14.4  < > 14.2 12.2*  HCT 42.5  < > 43.6 37.7*  MCV 82.7  < > 82.9 83.0  PLT 218  < > 161 189  < > = values in this interval not displayed.  Basic Metabolic Panel:  Recent Labs Lab 03/11/17 1215  03/12/17 0500 03/12/17 1042  NA 140  < > 146* 147*  K >7.5*  --  3.1*  --   CL 116*  --  121*  --   CO2 18*  --  19*  --   GLUCOSE 115*  --  105*  --   BUN 10  --  12  --   CREATININE 1.40*  --  1.44*  --   CALCIUM 8.5*  --  8.5*  --   < > = values in this interval not displayed.  Lipid Panel:     Component Value Date/Time   CHOL 226 (H) 03/09/2017 0408   TRIG 85 03/09/2017 0408   HDL 74 03/09/2017 0408   CHOLHDL 3.1 03/09/2017 0408   VLDL 17 03/09/2017 0408   LDLCALC 135 (H) 03/09/2017 0408   HgbA1c:  Lab Results  Component Value Date   HGBA1C <4.2 (L) 03/09/2017   Urine Drug Screen:     Component Value Date/Time   LABOPIA POSITIVE (A) 03/09/2017 0930   COCAINSCRNUR POSITIVE (A) 03/09/2017 0930   COCAINSCRNUR (A) 12/09/2007 0230    POSITIVE (NOTE) Result repeated and verified. Sent for confirmatory testing   LABBENZ POSITIVE (A) 03/09/2017 0930   LABBENZ NEGATIVE 12/09/2007 0230   AMPHETMU NONE DETECTED 03/09/2017 0930   THCU NONE DETECTED 03/09/2017 0930   LABBARB NONE  DETECTED 03/09/2017 0930    Alcohol Level     Component Value Date/Time   ETH <5 10/06/2016 1478    IMAGING I have personally reviewed the radiological images below and agree with the radiology interpretations.  [name] 03/08/2017 1. Truncated examination due to patient refusal to complete all sequences.  2. 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 The basal cisterns and fourth ventricle remain patent without obstructive hydrocephalus. No acute hemorrhage.  3. Small left occipital acute infarct.  4. Chronic microvascular ischemia and scattered punctate foci of chronic microhemorrhage.   Ct Head Wo Contrast 03/10/2017 1. Continued moderate to severe left cerebellar edema with interval mildly increased mass effect on the fourth ventricle and early lateral ventriculomegaly - mild enlargement of the temporal horn since yesterday but no transependymal edema is evident.  2. No hemorrhagic transformation or new intracranial abnormality.    LE venous doppler - Positive for acute deep vein thrombosis involving the right  peroneal veins and a short segment of the right posterior tibial veins at the proximal and mid level, and thrombus in the proximal muscular calf gastro veins.  TTE - pt not cooperative due to delirium  CTA Head and Neck  03/11/2017 1. 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 2. Left AICA is not clearly visualized, but the AICA origin and caliber are highly variable and it is often not identified on scans of normal patients. 3. Unchanged left cerebellar cytotoxic edema with mass effect on the fourth ventricle and basal cisterns. Crowding of the foramen magnum is also unchanged. 4. No hemorrhage or hydrocephalus.   PHYSICAL EXAM  Temp:  [97.4 F (36.3 C)-98.8 F (37.1 C)] 98.8 F (37.1 C) (04/28 1156) Pulse Rate:   [64-98] 73 (04/28 1500) Resp:  [12-27] 18 (04/28 0800) BP: (104-186)/(87-122) 177/115 (04/28 1500) SpO2:  [92 %-100 %] 97 % (04/28 1500)  General - Well nourished, well developed, delirium with agitation and confusion.  Ophthalmologic - Fundi not visualized due to noncooperation.  Cardiovascular - Regular rate and rhythm.  Neuro -  Drowsy but arouses easily and will follow commands. Diminished attention, registration and recall.[name]  PERRL, facial symmetrical, tongue midline. Moving BUE and BLE equally,  DTR 1+. Sensation symmetrical, gait not tested.    ASSESSMENT/PLAN Mr. EDWIN BAINES is a 61 y.o. male with history of HTN, CKD, cocaine use and mental disorder presenting with severe HA, nausea, vomiting, dizziness. He did not receive IV t-PA due to delay in arrival.   Delirium with agitation and confusion  CT repeat 03/11/17 showed no significant hydrocephalus or hemorrhage  Concerning for alcohol withdraw  Received ativan and haldol  Needs sitter  On CIWA protocol  Pt also has underlying depression and psychosis, on home meds seroquel 300mg  Qhs and zoloft 50mg   Discussed with CCM and recommend precedex  Will resume seroquel and zoloft  Stroke:   Large L PICA and small punctate L occipital (MCA/PCA) infarcts embolic secondary to unknown source vs. Thrombotic due to cocaine use  Resultant  Nystagmus and left eye abduction deficit, left UE mild dysmetria  NS consulted (Pool). ICU monitoring with f/u imaging for hydrocephalus eval  MRI  Truncated d/t pt refusal. Large L PICA infarct w/ cytotoxic edea and Rightward shift. No hydrocephalus. No hmg. Small L occipital infarct.   Repeat CT head mild hydrocephalus  LE venous Doppler  Right LE DVT  2D Echo  pending  CTA head and neck - Non-opacification of the left PICA, in keeping with acute left PICA territory infarct.  Consider outpt 30 day cardiac event monitoring to rule out afib as outpt  UDS - positive cocaine,  opiates and benzo  LDL 135  HgbA1c < 4.2  Lovenox 40 mg sq daily for VTE prophylaxis  Diet Heart Room service appropriate? Yes; Fluid consistency: Thin.   No antithrombotic prior to admission, now on aspirin 325 mg daily.   Patient counseled to be compliant with his antithrombotic medications  Ongoing aggressive stroke risk factor management  Therapy recommendations:  CIR   Disposition:  Pending  Cerebellar edema / hydrocephalus  NSG on board - no intervention needed at this time  Repeat CT no significant hydrocephalus  Close neuro check  On 3% saline, increase to 75cc/h  Na 139 -> 143 -> 140  Had 23.4% once last night  Right LE DVT  Not candidate for anticoagulation  IVC filter placed  ? Alcohol withdraw  Pt admitted  drinking alcohol  Not sure the amount and time of last drink  On CIWA protocol  B1/FA/MVI  Hypertension  BP 160-170s on arrival  On amlodipine, clonidine, HCTZ and metoprolol at home  Hydralazine PRN for BP control  Permissive hypertension (OK if < 180/105) but gradually normalize in 5-7 days  Long-term BP goal normotensive  Hyperlipidemia  Home meds:  No statin  LDL 135, goal < 70  Add lipitor 40mg   Cocaine abuse  UDS positive cocaine  Cessation counseling will be provided  Tobacco abuse  Current smoker  Smoking cessation counseling provided  Pt is willing to quit  Other Stroke Risk Factors     Other Active Problems  CKD Cre 1.52->1.47 -> 1.36  Agitation and depression - On zoloft  Psychosis - on seroquel   Mild leukocytosis - 12.8   Hyperkalemia - 7.2 (hemolysis) - repeat Bmet pending  Hospital day # 3 Discussed with Dr. Chase Caller. Continue present care. Repeat CT scan of the head without contrast tomorrow morning. Appreciate pulmonary critical care and neurosurgery help This patient is critically ill due to large cerebellar infarct, cocaine use, delirium, agitation, alcohol withdraw and at significant  risk of neurological worsening, death form recurrent stroke, hydrocephalus, cerebral edema, heart failure, MI, and DT. This patient's care requires constant monitoring of vital signs, hemodynamics, respiratory and cardiac monitoring, review of multiple databases, neurological assessment, discussion with family, other specialists and medical decision making of high complexity. I spent 50 minutes of neurocritical care time in the care of this patient.  Antony Contras, MD Stroke Neurology 03/12/2017 4:06 PM    To contact Stroke Continuity provider, please refer to [email or web address] After hours, contact General Neurology

## 2017-03-12 NOTE — Progress Notes (Signed)
PULMONARY / CRITICAL CARE MEDICINE   Name: Darryl Parsons MRN: [id number] DOB: 03/22/1956    ADMISSION DATE:  03/08/2017 CONSULTATION DATE:  4/27  REFERRING MD:  Erlinda Hong  CHIEF COMPLAINT:  Agitated delirium w/ concern for ETOH w/d   brief This is a 61 year old male who presented to the ER on 4/25 w/ cc: severe HA, dizziness, N&V. Dx eval demonstrated: large acute left PICA territory/cerebellar infarct w/ associated cerebral edema w/ some associated mid-line shift.  ->has h/o cocaine abuse (including the night prior)raising concern that cocaine induced vasospasm may have precipitated the event.   ES:  MR brain 4/24: 1. Truncated examination due to patient refusal to complete all sequences.2. 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 The basal cisterns and fourth ventricle remain patent without obstructive hydrocephalus. No acute hemorrhage. 3. Small left occipital acute infarct. 4. Chronic microvascular ischemia and scattered punctate foci of chronic microhemorrhage. CT head 4/26: 1. Continued moderate to severe left cerebellar edema with interval mildly increased mass effect on the fourth ventricle and early lateral ventriculomegaly - mild enlargement of the temporal horn since yesterday but no transependymal edema is evident. 2. No hemorrhagic transformation or new intracranial abnormality. 4/27: 1. 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 2. Left AICA is not clearly visualized, but the AICA origin and caliber are highly variable and it is often not identified on scans of normal patients.3. Unchanged left cerebellar cytotoxic edema with mass effect on the fourth ventricle and basal cisterns. Crowding of the foramen magnum is also unchanged.4. No hemorrhage or hydrocephalus.  CULTURES:   ANTIBIOTICS:  Hospital course  4/25 admitted to NICU  (did not get TPA d/t being outside window. Placed on CIWA protocol. Started on 3% saline. Allowing for permissive HTN (goal <220/120. Venous US obtained: + DVT RLE.  4/26 IVC filter placed. PCCM asked to place CVL for 3%. Left IJ CVL 4/26>>> 4/27 increased agitation. Hitting at staff. CT unchanged from earlier f/u CT day prior. PCCM asked to assess for precedex    SUBJECTIVE/OVERNIGHT/INTERVAL HX 4/28 - was threatening nursing staff. Now terstarined. PEr nsgy  - partially oriented only but no indication for posterior fossa decompression. Hx of psychosis on mood stablizers chronically. Precedex continues -> sitter says calmer past 2h. Patient said "ok" when I  Told him "hang in there"   VITAL SIGNS: BP (!) 166/118   Pulse 65   Temp 98.8 F (37.1 C) (Axillary)   Resp 18   Ht 5\' 8"  (1.727 m)   Wt 85.9 kg (189 lb 6 oz)   SpO2 94%   BMI 28.79 kg/m   INTAKE / OUTPUT: I/O last 3 completed shifts: In: 4123.4 [P.O.:1000; I.V.:3123.4] Out: 1600 [Urine:1600]    General Appearance:    Looks criticall ill   Head:    Normocephalic, without obvious abnormality, atraumatic  Eyes:    PERRL - yes, conjunctiva/corneas - clear      Ears:    Normal external ear canals, both ears  Nose:   NG tube - no  Throat:  ETT TUBE - no , OG tube - no  Neck:   Supple,  No enlargement/tenderness/nodules     Lungs:     Clear to auscultation bilaterally,   Chest wall:    No deformity  Heart:    S1 and S2 normal, no murmur, CVP - no.  Pressors - no  Abdomen:     Soft, no masses, no organomegaly  Genitalia:    Not done  Rectal:   not done  Extremities:   Extremities- intact     Skin:   Intact in exposed areas .      Neurologic:   Sedation - precedex gtt -> RASS - -1 . Moves all 4s - yes. CAM-ICU - confused . Orientation - agitated     PULMONARY No results for input(s): PHART, PCO2ART, PO2ART, HCO3, TCO2, O2SAT in the last 168 hours.  Invalid input(s): PCO2, PO2  CBC  Recent Labs Lab 03/10/17 0549  03/11/17 0600 03/12/17 0500  HGB 15.2 14.2 12.2*  HCT 45.6 43.6 37.7*  WBC 11.4* 12.8* 10.0  PLT 245 161 189    COAGULATION  Recent Labs Lab 03/10/17 0803  INR 0.98    CARDIAC  No results for input(s): TROPONINI in the last 168 hours. No results for input(s): PROBNP in the last 168 hours.   CHEMISTRY  Recent Labs Lab 03/08/17 2105 03/09/17 0408  03/10/17 0549  03/11/17 0855 03/11/17 1215 03/11/17 1656 03/11/17 2230 03/12/17 0500 03/12/17 1042  NA 138  --   < > 137  < > 140 140 144 145 146* 147*  K 3.5  --   --  3.2*  --  7.2* >7.5*  --   --  3.1*  --   CL 105  --   --  107  --  117* 116*  --   --  121*  --   CO2 23  --   --  22  --  18* 18*  --   --  19*  --   GLUCOSE 104*  --   --  113*  --  112* 115*  --   --  105*  --   BUN 17  --   --  14  --  11 10  --   --  12  --   CREATININE 1.52* 1.54*  --  1.47*  --  1.36* 1.40*  --   --  1.44*  --   CALCIUM 9.0  --   --  9.1  --  8.5* 8.5*  --   --  8.5*  --   < > = values in this interval not displayed. Estimated Creatinine Clearance: 58.9 mL/min (A) (by C-G formula based on SCr of 1.44 mg/dL (H)).   LIVER  Recent Labs Lab 03/08/17 2105 03/10/17 0803  AST 30  --   ALT 14*  --   ALKPHOS 72  --   BILITOT 0.7  --   PROT 8.0  --   ALBUMIN 4.1  --   INR  --  0.98     INFECTIOUS No results for input(s): LATICACIDVEN, PROCALCITON in the last 168 hours.   ENDOCRINE CBG (last 3)  No results for input(s): GLUCAP in the last 72 hours.       IMAGING x48h  - image(s) personally visualized  -   highlighted in bold Ct Angio Head W Or Wo Contrast  Result Date: 03/11/2017 CLINICAL DATA:  Follow-up CVA EXAM: CT ANGIOGRAPHY HEAD AND NECK TECHNIQUE: Multidetector CT imaging of the head and neck was performed using the standard protocol during bolus administration of intravenous contrast. Multiplanar CT image reconstructions and MIPs were obtained to evaluate the vascular anatomy. Carotid stenosis measurements  (when applicable) are obtained utilizing NASCET criteria, using the distal internal carotid diameter as the denominator. CONTRAST:  50 mL Isovue 370 COMPARISON:  Head CT 03/10/2017 FINDINGS: CTA NECK FINDINGS Aortic arch: There is no aneurysm or dissection of the visualized ascending aorta or aortic arch. There is a normal 3 vessel branching pattern. The visualized proximal subclavian arteries are normal. Right carotid system: The right common carotid origin is widely patent. There is no common carotid or internal carotid artery dissection or aneurysm. No hemodynamically significant stenosis. Left carotid system: The left common carotid origin is widely patent. There is no common carotid or internal carotid artery dissection or aneurysm. No hemodynamically significant stenosis. Vertebral arteries: The vertebral system is codominant. Both vertebral artery origins are normal. Both vertebral arteries are normal to their confluence with the basilar artery. Skeleton: There is no bony spinal canal stenosis. No lytic or blastic lesions. Other neck: The nasopharynx is clear. The oropharynx and hypopharynx are normal. The epiglottis is normal. The supraglottic larynx, glottis and subglottic larynx are normal. No retropharyngeal collection. The parapharyngeal spaces are preserved. The parotid and submandibular glands are normal. No sialolithiasis or salivary ductal dilatation. The thyroid gland is normal. There is no cervical lymphadenopathy. Upper chest: No pneumothorax or pleural effusion. No nodules or masses. Review of the MIP images confirms the above findings CTA HEAD FINDINGS Anterior circulation: --Intracranial internal carotid arteries: Normal. --Anterior cerebral arteries: Normal. --Middle cerebral arteries: Normal. --Posterior communicating arteries: Absent bilaterally. Posterior circulation: --Posterior cerebral arteries: Normal. --Superior cerebellar arteries: Normal. --Basilar artery: Normal. --Anterior inferior  cerebellar arteries: Normal on the right. Not clearly seen on the left. --Posterior inferior cerebellar arteries: Normal on the right but not visualized on the left. Venous sinuses: As permitted by contrast timing, patent. Anatomic variants: None Delayed phase: No parenchymal contrast enhancement. Area of cytotoxic edema in the left PICA distribution is unchanged in size, with persistent narrowing of the fourth ventricle and basal cisterns. The sizes of the lateral ventricle temporal horns are unchanged. No hemorrhage. Unchanged crowding of the foramen magnum. Review of the MIP images confirms the above findings IMPRESSION: 1. 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 2. Left AICA is not clearly visualized, but the AICA origin and caliber are highly variable and it is often not identified on scans of normal patients. 3. Unchanged left cerebellar cytotoxic edema with mass effect on the fourth ventricle and basal cisterns. Crowding of the foramen magnum is also unchanged. 4. No hemorrhage or hydrocephalus. Electronically Signed   By: Ulyses Jarred M.D.   On: 03/11/2017 06:34   Ct Angio Neck W Or Wo Contrast  Result Date: 03/11/2017 CLINICAL DATA:  Follow-up CVA EXAM: CT ANGIOGRAPHY HEAD AND NECK TECHNIQUE: Multidetector CT imaging of the head and neck was performed using the standard protocol during bolus administration of intravenous contrast. Multiplanar CT image reconstructions and MIPs were obtained to evaluate the vascular anatomy. Carotid stenosis measurements (when applicable) are obtained utilizing NASCET criteria, using the distal internal carotid diameter as the denominator. CONTRAST:  50 mL Isovue 370 COMPARISON:  Head CT 03/10/2017 FINDINGS: CTA NECK FINDINGS Aortic arch: There is no aneurysm or dissection of the visualized ascending aorta or aortic arch. There is a normal 3 vessel branching pattern. The visualized proximal subclavian arteries are normal.  Right carotid system: The right common carotid origin is widely patent. There is no common carotid or internal carotid artery dissection or aneurysm. No hemodynamically significant stenosis. Left carotid system: The left common carotid origin is widely patent. There is no common carotid or internal carotid artery dissection or aneurysm. No hemodynamically significant  stenosis. Vertebral arteries: The vertebral system is codominant. Both vertebral artery origins are normal. Both vertebral arteries are normal to their confluence with the basilar artery. Skeleton: There is no bony spinal canal stenosis. No lytic or blastic lesions. Other neck: The nasopharynx is clear. The oropharynx and hypopharynx are normal. The epiglottis is normal. The supraglottic larynx, glottis and subglottic larynx are normal. No retropharyngeal collection. The parapharyngeal spaces are preserved. The parotid and submandibular glands are normal. No sialolithiasis or salivary ductal dilatation. The thyroid gland is normal. There is no cervical lymphadenopathy. Upper chest: No pneumothorax or pleural effusion. No nodules or masses. Review of the MIP images confirms the above findings CTA HEAD FINDINGS Anterior circulation: --Intracranial internal carotid arteries: Normal. --Anterior cerebral arteries: Normal. --Middle cerebral arteries: Normal. --Posterior communicating arteries: Absent bilaterally. Posterior circulation: --Posterior cerebral arteries: Normal. --Superior cerebellar arteries: Normal. --Basilar artery: Normal. --Anterior inferior cerebellar arteries: Normal on the right. Not clearly seen on the left. --Posterior inferior cerebellar arteries: Normal on the right but not visualized on the left. Venous sinuses: As permitted by contrast timing, patent. Anatomic variants: None Delayed phase: No parenchymal contrast enhancement. Area of cytotoxic edema in the left PICA distribution is unchanged in size, with persistent narrowing of the  fourth ventricle and basal cisterns. The sizes of the lateral ventricle temporal horns are unchanged. No hemorrhage. Unchanged crowding of the foramen magnum. Review of the MIP images confirms the above findings IMPRESSION: 1. 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 2. Left AICA is not clearly visualized, but the AICA origin and caliber are highly variable and it is often not identified on scans of normal patients. 3. Unchanged left cerebellar cytotoxic edema with mass effect on the fourth ventricle and basal cisterns. Crowding of the foramen magnum is also unchanged. 4. No hemorrhage or hydrocephalus. Electronically Signed   By: Ulyses Jarred M.D.   On: 03/11/2017 06:34   Dg Chest Port 1 View  Result Date: 03/10/2017 CLINICAL DATA:  Central venous catheter placement. EXAM: PORTABLE CHEST 1 VIEW COMPARISON:  03/09/2017 and prior radiographs FINDINGS: Upper limits normal heart size, mediastinal fullness and elevation of the right hemidiaphragm are unchanged. A left IJ central venous catheter is noted with tip overlying the upper SVC. There is no evidence of focal airspace disease, pulmonary edema, suspicious pulmonary nodule/mass, pleural effusion, or pneumothorax. No acute bony abnormalities are identified. IMPRESSION: Left IJ central venous catheter with tip overlying the upper SVC. No evidence of pneumothorax. Electronically Signed   By: Margarette Canada M.D.   On: 03/10/2017 12:45       DISCUSSION: Left cerebellar stroke. Presume cocaine induced. Now w/ probably ETOH w/d. Will add precedex. PRN versed, BP goal <180/105, cont 3% protocol, f/u chemistries.   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 Source unclear ? Af vs cocaine related vasospasm Acute encephalopathy: presumed Delirium tremens  h/o substance abuse     03/12/2017 - agitated encephalopathy controlled with precedx, restraints and  sitter   P:   RASS goal: 0 to -1 precedex gtt Thiamine and folate Aspirin for stroke prevention  3% saline infusion protocol Serial imaging per neuro and neuro-surg services re: watching for obstructive hydrocephalus   Pulmonary  A: Elevated right HD  03/12/2017  - protecting airway P: TIS  CARDIOVASCULAR A:  HTN Tachycardia  hyperlipidemia  P:  Allowing for permissive HTN <180/105 Cont tele  Will need to see  how he does w/ precedex. Will be at risk for hypotension Cont lipitor   RENAL A:   CKD stage IV  Hyperchloremia  NAGMA Hyperkalemia ? Hemolysis   03/12/2017 - nil acute P:   Serial chemistries Hypernatremia protocol   GASTROINTESTINAL A:   Aspiration risk P:   Aspiration precautions   HEMATOLOGIC A:   RLE DVT P:  IVC filter Trend CBC Eventually decide of systemic a/c     FAMILY  - Updates no family at bedside  - Inter-disciplinary family meet or Palliative Care meeting due by:  5/2    Dr. Brand Males, M.D., [facility] [address] [address]C.P Pulmonary and Critical Care Medicine Staff Physician [facility] Pager: [phone], If no answer or between  15:00h - 7:00h: call [phone]  03/12/2017 12:54 PM

## 2017-03-13 ENCOUNTER — Inpatient Hospital Stay (HOSPITAL_COMMUNITY): Payer: Medicaid Other

## 2017-03-13 DIAGNOSIS — G9340 Encephalopathy, unspecified: Secondary | ICD-10-CM

## 2017-03-13 LAB — CBC WITH DIFFERENTIAL/PLATELET
BASOS ABS: 0 10*3/uL (ref 0.0–0.1)
BASOS PCT: 0 %
Eosinophils Absolute: 0.1 10*3/uL (ref 0.0–0.7)
Eosinophils Relative: 1 %
HCT: 36.7 % — ABNORMAL LOW (ref 39.0–52.0)
HEMOGLOBIN: 12.3 g/dL — AB (ref 13.0–17.0)
Lymphocytes Relative: 32 %
Lymphs Abs: 2.5 10*3/uL (ref 0.7–4.0)
MCH: 27.8 pg (ref 26.0–34.0)
MCHC: 33.5 g/dL (ref 30.0–36.0)
MCV: 83 fL (ref 78.0–100.0)
MONOS PCT: 7 %
Monocytes Absolute: 0.5 10*3/uL (ref 0.1–1.0)
NEUTROS ABS: 4.5 10*3/uL (ref 1.7–7.7)
NEUTROS PCT: 60 %
Platelets: 192 10*3/uL (ref 150–400)
RBC: 4.42 MIL/uL (ref 4.22–5.81)
RDW: 15.1 % (ref 11.5–15.5)
WBC: 7.6 10*3/uL (ref 4.0–10.5)

## 2017-03-13 LAB — ECHOCARDIOGRAM COMPLETE
E/e' ratio: 10.36
EWDT: 275 ms
FS: 30 % (ref 28–44)
Height: 68 in
IVS/LV PW RATIO, ED: 0.92
LA ID, A-P, ES: 35 mm
LA diam index: 1.71 cm/m2
LA vol index: 25.4 mL/m2
LA vol: 52 mL
LAVOLA4C: 52.9 mL
LDCA: 3.46 cm2
LEFT ATRIUM END SYS DIAM: 35 mm
LV E/e'average: 10.36
LV TDI E'LATERAL: 4.5
LV TDI E'MEDIAL: 4.61
LV e' LATERAL: 4.5 cm/s
LVEEMED: 10.36
LVOT VTI: 19.1 cm
LVOT diameter: 21 mm
LVOTPV: 93.8 cm/s
LVOTSV: 66 mL
MV Dec: 275
MV pk E vel: 46.6 m/s
MVPKAVEL: 94.1 m/s
PW: 11.7 mm — AB (ref 0.6–1.1)
RV LATERAL S' VELOCITY: 13.2 cm/s
RV TAPSE: 25.8 mm
WEIGHTICAEL: 3030 [oz_av]

## 2017-03-13 LAB — BASIC METABOLIC PANEL
Anion gap: 8 (ref 5–15)
BUN: 7 mg/dL (ref 6–20)
CALCIUM: 8.4 mg/dL — AB (ref 8.9–10.3)
CO2: 19 mmol/L — AB (ref 22–32)
Chloride: 116 mmol/L — ABNORMAL HIGH (ref 101–111)
Creatinine, Ser: 1.25 mg/dL — ABNORMAL HIGH (ref 0.61–1.24)
GFR calc Af Amer: >60 mL/min (ref >60)
GLUCOSE: 98 mg/dL (ref 65–99)
Potassium: 3.2 mmol/L — ABNORMAL LOW (ref 3.5–5.1)
Sodium: 143 mmol/L (ref 135–145)

## 2017-03-13 LAB — SODIUM
SODIUM: 142 mmol/L (ref 135–145)
SODIUM: 141 mmol/L (ref 135–145)
SODIUM: 139 mmol/L (ref 135–145)
SODIUM: 136 mmol/L (ref 135–145)

## 2017-03-13 LAB — MAGNESIUM: MAGNESIUM: 1.7 mg/dL (ref 1.7–2.4)

## 2017-03-13 LAB — PHOSPHORUS: Phosphorus: 1.6 mg/dL — ABNORMAL LOW (ref 2.5–4.6)

## 2017-03-13 MED ORDER — POTASSIUM PHOSPHATES 15 MMOLE/5ML IV SOLN
24.0000 mmol | Freq: Once | INTRAVENOUS | Status: AC
  Administered 2017-03-13: 24 mmol via INTRAVENOUS
  Filled 2017-03-13: qty 8

## 2017-03-13 MED ORDER — HALOPERIDOL LACTATE 5 MG/ML IJ SOLN: 5.0000 mg | Freq: Four times a day (QID) | INTRAMUSCULAR | Status: DC | PRN

## 2017-03-13 MED ORDER — SODIUM CHLORIDE 3 % IV SOLN
INTRAVENOUS | Status: AC
  Administered 2017-03-13: 37.5 mL/h via INTRAVENOUS
  Filled 2017-03-13: qty 500

## 2017-03-13 MED ORDER — MAGNESIUM SULFATE 4 GM/100ML IV SOLN
4.0000 g | Freq: Once | INTRAVENOUS | Status: AC
  Administered 2017-03-13: 4 g via INTRAVENOUS
  Filled 2017-03-13: qty 100

## 2017-03-13 MED ORDER — SODIUM CHLORIDE 0.9 % IV SOLN
30.0000 meq | Freq: Once | INTRAVENOUS | Status: AC
  Administered 2017-03-13: 30 meq via INTRAVENOUS
  Filled 2017-03-13: qty 15

## 2017-03-13 MED ORDER — POTASSIUM CHLORIDE 2 MEQ/ML IV SOLN
30.0000 meq | Freq: Once | INTRAVENOUS | Status: AC
  Administered 2017-03-13: 30 meq via INTRAVENOUS
  Filled 2017-03-13: qty 15

## 2017-03-13 MED ORDER — HALOPERIDOL LACTATE 5 MG/ML IJ SOLN
5.0000 mg | Freq: Four times a day (QID) | INTRAMUSCULAR | Status: DC
  Administered 2017-03-13 – 2017-03-17 (×15): 5 mg via INTRAVENOUS
  Filled 2017-03-13 (×16): qty 1

## 2017-03-13 MED ORDER — POTASSIUM CHLORIDE CRYS ER 20 MEQ PO TBCR: 40.0000 meq | EXTENDED_RELEASE_TABLET | ORAL | Status: DC

## 2017-03-13 NOTE — Progress Notes (Signed)
Pt seen and examined. No issues overnight.  EXAM: Temp:  [97.4 F (36.3 C)-98.8 F (37.1 C)] 98.8 F (37.1 C) (04/29 0400) Pulse Rate:  [57-98] 66 (04/29 0600) Resp:  [12-25] 14 (04/29 0600) BP: (138-186)/(100-127) 159/114 (04/29 0600) SpO2:  [94 %-100 %] 97 % (04/29 0600) Intake/Output      04/28 0701 - 04/29 0700   P.O. 900   I.V. (mL/kg) 2067 (24.1)   IV Piggyback 265   Total Intake(mL/kg) 3232 (37.6)   Urine (mL/kg/hr) 4750 (2.3)   Total Output 4750   Net -1518        Awake and alert Follows commands throughout Full strength CT head shows expected evolution of cerebellar infarct, ventricles unchanged  Stable Continue current care Probably out of the main swelling window, unlikely to require surgical intervention

## 2017-03-13 NOTE — Progress Notes (Signed)
STROKE TEAM PROGRESS NOTE   SUBJECTIVE (INTERVAL HISTORY) Patient remains on Precedex and in restraints.  but still needs sitter. Discussed with CCM and resumed home seroquel for underlying psychosis  and added haldol vs. alcohol withdraw at this time.   Ct head today showed evolving LEFT posterior-inferior cerebellar artery territory infarct nearly effacing the fourth ventricle resulting in stable mild hydrocephalus.   OBJECTIVE Temp:  [97.6 F (36.4 C)-98.8 F (37.1 C)] 98 F (36.7 C) (04/29 0814) Pulse Rate:  [57-98] 66 (04/29 0600) Cardiac Rhythm: Normal sinus rhythm (04/29 0400) Resp:  [12-25] 16 (04/29 0700) BP: (138-186)/(100-127) 146/110 (04/29 0700) SpO2:  [94 %-100 %] 97 % (04/29 0600)  CBC:   Recent Labs Lab 03/08/17 2105  03/12/17 0500 03/13/17 0422  WBC 10.5  < > 10.0 7.6  NEUTROABS 8.5*  --   --  4.5  HGB 14.4  < > 12.2* 12.3*  HCT 42.5  < > 37.7* 36.7*  MCV 82.7  < > 83.0 83.0  PLT 218  < > 189 192  < > = values in this interval not displayed.  Basic Metabolic Panel:  Recent Labs Lab 03/12/17 0500  03/13/17 0045 03/13/17 0422  NA 146*  < > 143 142  K 3.1*  --  3.2*  --   CL 121*  --  116*  --   CO2 19*  --  19*  --   GLUCOSE 105*  --  98  --   BUN 12  --  7  --   CREATININE 1.44*  --  1.25*  --   CALCIUM 8.5*  --  8.4*  --   MG  --   --   --  1.7  PHOS  --   --   --  1.6*  < > = values in this interval not displayed.  Lipid Panel:     Component Value Date/Time   CHOL 226 (H) 03/09/2017 0408   TRIG 85 03/09/2017 0408   HDL 74 03/09/2017 0408   CHOLHDL 3.1 03/09/2017 0408   VLDL 17 03/09/2017 0408   LDLCALC 135 (H) 03/09/2017 0408   HgbA1c:  Lab Results  Component Value Date   HGBA1C <4.2 (L) 03/09/2017   Urine Drug Screen:     Component Value Date/Time   LABOPIA POSITIVE (A) 03/09/2017 0930   COCAINSCRNUR POSITIVE (A) 03/09/2017 0930   COCAINSCRNUR (A) 12/09/2007 0230    POSITIVE (NOTE) Result repeated and verified. Sent for  confirmatory testing   LABBENZ POSITIVE (A) 03/09/2017 0930   LABBENZ NEGATIVE 12/09/2007 0230   AMPHETMU NONE DETECTED 03/09/2017 0930   THCU NONE DETECTED 03/09/2017 0930   LABBARB NONE DETECTED 03/09/2017 0930    Alcohol Level     Component Value Date/Time   ETH <5 10/06/2016 4097    IMAGING I have personally reviewed the radiological images below and agree with the radiology interpretations.  [name] 03/08/2017 1. Truncated examination due to patient refusal to complete all sequences.  2. 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 The basal cisterns and fourth ventricle remain patent without obstructive hydrocephalus. No acute hemorrhage.  3. Small left occipital acute infarct.  4. Chronic microvascular ischemia and scattered punctate foci of chronic microhemorrhage.   Ct Head Wo Contrast 03/10/2017 1. Continued moderate to severe left cerebellar edema with interval mildly increased mass effect on the fourth ventricle and early lateral ventriculomegaly - mild  enlargement of the temporal horn since yesterday but no transependymal edema is evident.  2. No hemorrhagic transformation or new intracranial abnormality.    LE venous doppler - Positive for acute deep vein thrombosis involving the right peroneal veins and a short segment of the right posterior tibial veins at the proximal and mid level, and thrombus in the proximal muscular calf gastro veins.  TTE - pt not cooperative due to delirium  CTA Head and Neck  03/11/2017 1. 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 2. Left AICA is not clearly visualized, but the AICA origin and caliber are highly variable and it is often not identified on scans of normal patients. 3. Unchanged left cerebellar cytotoxic edema with mass effect on the fourth ventricle and basal cisterns.  Crowding of the foramen magnum is also unchanged. 4. No hemorrhage or hydrocephalus.   PHYSICAL EXAM  Temp:  [97.6 F (36.4 C)-98.8 F (37.1 C)] 98 F (36.7 C) (04/29 0814) Pulse Rate:  [57-98] 66 (04/29 0600) Resp:  [12-25] 16 (04/29 0700) BP: (138-186)/(100-127) 146/110 (04/29 0700) SpO2:  [94 %-100 %] 97 % (04/29 0600)  General - Well nourished, well developed, delirium with agitation and confusion.  Ophthalmologic - Fundi not visualized due to noncooperation.  Cardiovascular - Regular rate and rhythm.  Neuro -  Drowsy but arouses easily and will follow commands. Diminished attention, registration and recall.[name]  PERRL, facial symmetrical, tongue midline. Moving BUE and BLE equally,  DTR 1+. Sensation symmetrical, gait not tested.    ASSESSMENT/PLAN [name] is a 61 y.o. male with history of HTN, CKD, cocaine use and mental disorder presenting with severe HA, nausea, vomiting, dizziness. He did not receive IV t-PA due to delay in arrival.   Delirium with agitation and confusion  CT repeat 03/11/17 showed no significant hydrocephalus or hemorrhage  Concerning for alcohol withdraw  Received ativan and haldol  Needs sitter  On CIWA protocol  Pt also has underlying depression and psychosis, on home meds seroquel 300mg  Qhs and zoloft 50mg   Discussed with CCM and recommend precedex  Will resume seroquel and zoloft  Stroke:   Large L PICA and small punctate L occipital (MCA/PCA) infarcts embolic secondary to unknown source vs. Thrombotic due to cocaine use  Resultant  Nystagmus and left eye abduction deficit, left UE mild dysmetria  NS consulted (Pool). ICU monitoring with f/u imaging for hydrocephalus eval  MRI  Truncated d/t pt refusal. Large L PICA infarct w/ cytotoxic edea and Rightward shift. No hydrocephalus. No hmg. Small L occipital infarct.   Repeat CT head mild hydrocephalus  LE venous Doppler  Right LE DVT  2D Echo  pending  CTA head and  neck - Non-opacification of the left PICA, in keeping with acute left PICA territory infarct.  Consider outpt 30 day cardiac event monitoring to rule out afib as outpt  UDS - positive cocaine, opiates and benzo  LDL 135  HgbA1c < 4.2  Lovenox 40 mg sq daily for VTE prophylaxis  Diet Heart Room service appropriate? Yes; Fluid consistency: Thin.   No antithrombotic prior to admission, now on aspirin 325 mg daily.   Patient counseled to be compliant with his antithrombotic medications  Ongoing aggressive stroke risk factor management  Therapy recommendations:  CIR   Disposition:  Pending  Cerebellar edema / hydrocephalus  NSG on board - no intervention needed at this time  Repeat CT no significant hydrocephalus  Close neuro check  On  3% saline, increase to 75cc/h  Na 139 -> 143 -> 140  Had 23.4% once last night  Right LE DVT  Not candidate for anticoagulation  IVC filter placed  ? Alcohol withdraw  Pt admitted drinking alcohol  Not sure the amount and time of last drink  On CIWA protocol  B1/FA/MVI  Hypertension  BP 160-170s on arrival  On amlodipine, clonidine, HCTZ and metoprolol at home  Hydralazine PRN for BP control  Permissive hypertension (OK if < 180/105) but gradually normalize in 5-7 days  Long-term BP goal normotensive  Hyperlipidemia  Home meds:  No statin  LDL 135, goal < 70  Add lipitor 40mg   Cocaine abuse  UDS positive cocaine  Cessation counseling will be provided  Tobacco abuse  Current smoker  Smoking cessation counseling provided  Pt is willing to quit  Other Stroke Risk Factors     Other Active Problems  CKD Cre 1.52->1.47 -> 1.36  Agitation and depression - On zoloft  Psychosis - on seroquel   Mild leukocytosis - 12.8   Hyperkalemia - 7.2 (hemolysis) - repeat Bmet pending  Hospital day # 4 Discussed with Dr. Chase Caller. Continue present care. Agree with adding haldol and trying to wean Precedex.  Appreciate pulmonary critical care and neurosurgery help This patient is critically ill due to large cerebellar infarct, cocaine use, delirium, agitation, alcohol withdraw and at significant risk of neurological worsening, death form recurrent stroke, hydrocephalus, cerebral edema, heart failure, MI, and DT. This patient's care requires constant monitoring of vital signs, hemodynamics, respiratory and cardiac monitoring, review of multiple databases, neurological assessment, discussion with family, other specialists and medical decision making of high complexity. I spent 30 minutes of neurocritical care time in the care of this patient.  Antony Contras, MD Medical Director [facility] Pager: [phone] 03/13/2017 2:46 PM   To contact Stroke Continuity provider, please refer to [email or web address] After hours, contact General Neurology

## 2017-03-13 NOTE — Progress Notes (Signed)
Physical Therapy Treatment Patient Details Name: Darryl Parsons MRN: [id number] DOB: [birth year] Today's Date: 03/13/2017    History of Present Illness Pt is a 61 y.o. male admitted to ED on 03/08/17 with severe headache, dizziness, nausea and vomiting. MRI on 4/24 showed large acute infarct of L PICA and small L occipital acute infarct. Pt s/p IVC filter placement 4/26. Pertinent PMH includes mental disorder, HTN, CKD, LBP. [name] Became very combative and was verbally threatening staff morning of 4/29.    PT Comments    Pt with an episode of confusion, combativeness, and aggression earlier this morning requiring precedex and restraints. Pt now requires maxAx2 for safe ambulation. Pt con't to demo poor processing, delayed sequencing and impaired balance. Con't to recommend CIR upon d/c for maximal functional recovery.   Follow Up Recommendations  CIR;Supervision/Assistance - 24 hour     Equipment Recommendations   (TBD)    Recommendations for Other Services Rehab consult     Precautions / Restrictions Precautions Precautions: Fall Restrictions Weight Bearing Restrictions: No    Mobility  Bed Mobility Overal bed mobility: Needs Assistance Bed Mobility: Supine to Sit     Supine to sit: Min assist;Mod assist     General bed mobility comments: max tactile directional v/c's for sequencing transfer  Transfers Overall transfer level: Needs assistance Equipment used: 2 person hand held assist Transfers: Sit to/from Stand Sit to Stand: Min assist;Mod assist;+2 physical assistance         General transfer comment: assist to power up, increased time, not impulsive  Ambulation/Gait Ambulation/Gait assistance: Mod assist;Max assist;+2 physical assistance Ambulation Distance (Feet): 50 Feet Assistive device: 2 person hand held assist (3rd person for chair follow) Gait Pattern/deviations: Step-to pattern;Decreased stride length;Wide base of support Gait velocity: slow Gait  velocity interpretation: Below normal speed for age/gender General Gait Details: max directional v/c's to sequence stepping pattern. tactile cues to posterior L hip to both stability L LE for advancing R LE and to increase step length/heigh on L when advancing. pt stopped frequently   Stairs            Wheelchair Mobility    Modified Rankin (Stroke Patients Only) Modified Rankin (Stroke Patients Only) Pre-Morbid Rankin Score: No symptoms Modified Rankin: Moderately severe disability     Balance Overall balance assessment: Needs assistance Sitting-balance support: No upper extremity supported;Feet supported Sitting balance-Leahy Scale: Fair     Standing balance support: Bilateral upper extremity supported Standing balance-Leahy Scale: Poor Standing balance comment: requires physical assist                            Cognition Arousal/Alertness: Lethargic Behavior During Therapy: Flat affect Overall Cognitive Status: No family/caregiver present to determine baseline cognitive functioning Area of Impairment: Problem solving                         Safety/Judgement: Decreased awareness of safety;Decreased awareness of deficits Awareness: Emergent Problem Solving: Slow processing;Decreased initiation;Difficulty sequencing;Requires verbal cues;Requires tactile cues General Comments: Pt no longer combative. pt appropriately stated he was at [facility] because he had a stroke, stated [place], [place].  Pt appropriately asking rehab questions regarding stroke recovery. Pt however was very lethargic and had delayed processing      Exercises      General Comments        Pertinent Vitals/Pain Pain Assessment: No/denies pain    Home Living  Prior Function            PT Goals (current goals can now be found in the care plan section) Acute Rehab PT Goals Patient Stated Goal: didn't state Progress towards PT goals: Progressing  toward goals (regressed compared to eval due to precedex)    Frequency    Min 4X/week      PT Plan Current plan remains appropriate    Co-evaluation             End of Session Equipment Utilized During Treatment: Gait belt Activity Tolerance: Patient tolerated treatment well Patient left: in chair;with call bell/phone within reach;with nursing/sitter in room (posey belt on) Nurse Communication: Mobility status PT Visit Diagnosis: Difficulty in walking, not elsewhere classified (R26.2)     Time: 8280-0349 PT Time Calculation (min) (ACUTE ONLY): 20 min  Charges:  $Gait Training: 8-22 mins                    G Codes:       Kittie Plater, PT, DPT Pager #: [phone] Office #: [phone]    [place] 03/13/2017, 3:58 PM

## 2017-03-13 NOTE — Progress Notes (Signed)
Pt hitting and kicking RN and NT. Spit on NT, and threatening to "cut staff". Order received for restraints. RN will continue to monitor.

## 2017-03-13 NOTE — Progress Notes (Signed)
PULMONARY / CRITICAL CARE MEDICINE   Name: Darryl Parsons MRN: [id number] DOB: 1956/04/16    ADMISSION DATE:  03/08/2017 CONSULTATION DATE:  4/27  REFERRING MD:  Erlinda Hong  CHIEF COMPLAINT:  Agitated delirium w/ concern for ETOH w/d   brief This is a 61 year old male who presented to the ER on 4/25 w/ cc: severe HA, dizziness, N&V. Dx eval demonstrated: large acute left PICA territory/cerebellar infarct w/ associated cerebral edema w/ some associated mid-line shift.  ->has h/o cocaine abuse (including the night prior)raising concern that cocaine induced vasospasm may have precipitated the event. Agitation and delirium have been worsening despite precedex initiation, and Ativan per CIWA protocol.  ES:  MR brain 4/24: 1. Truncated examination due to patient refusal to complete all sequences.2. 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 The basal cisterns and fourth ventricle remain patent without obstructive hydrocephalus. No acute hemorrhage. 3. Small left occipital acute infarct. 4. Chronic microvascular ischemia and scattered punctate foci of chronic microhemorrhage. CT head 4/26: 1. Continued moderate to severe left cerebellar edema with interval mildly increased mass effect on the fourth ventricle and early lateral ventriculomegaly - mild enlargement of the temporal horn since yesterday but no transependymal edema is evident. 2. No hemorrhagic transformation or new intracranial abnormality. 4/27: 1. 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 2. Left AICA is not clearly visualized, but the AICA origin and caliber are highly variable and it is often not identified on scans of normal patients.3. Unchanged left cerebellar cytotoxic edema with mass effect on the fourth ventricle and basal cisterns. Crowding of the foramen magnum is also unchanged.4. No  hemorrhage or hydrocephalus.  CULTURES: 4/25>> MRSA Negative  ANTIBIOTICS: None  Hospital course  4/25 admitted to NICU (did not get TPA d/t being outside window. Placed on CIWA protocol. Started on 3% saline. Allowing for permissive HTN (goal <220/120. Venous US obtained: + DVT RLE.  4/26 IVC filter placed. PCCM asked to place CVL for 3%. Left IJ CVL 4/26>>> 4/27 increased agitation. Hitting at staff. CT unchanged from earlier f/u CT day prior. PCCM asked to assess for precedex    SUBJECTIVE/OVERNIGHT/INTERVAL HX 4/29 - Continues to  threaten nursing staff. Now restrained  - partially oriented only but no indication for posterior fossa decompression. Hx of psychosis on mood stablizers chronically.(Seroquel and Zoloft), but also suspect delirium.  Precedex continues at 1.2 mcg/kg -> He states "you need to help him get out of here."  VITAL SIGNS: BP (!) 146/110   Pulse 66   Temp 98.8 F (37.1 C) (Axillary)   Resp 16   Ht 5\' 8"  (1.727 m)   Wt 189 lb 6 oz (85.9 kg)   SpO2 97%   BMI 28.79 kg/m   INTAKE / OUTPUT: I/O last 3 completed shifts: In: 4739.9 [P.O.:1140; I.V.:3334.9; IV Piggyback:265] Out: 5200 [Urine:5200]    General Appearance:    Awake, sitting up in bed, fighting against restraints.Selectively follows commands.  Head:    Normocephalic, without obvious abnormality, atraumatic  Eyes:    PERRL - conjunctiva and corneas clear      Ears:    Normal external ear canals, both ears, without drainage  Nose:   WNL     Neck:   Supple,  No enlargement/tenderness/nodules     Lungs:     Clear throughout, slightly diminished per bases  Chest wall:    No  obvious deformity noted  Heart:    S1 and S2 normal, no MRG  Abdomen:     Soft, BS+, non-tender, non-distended  Genitalia:    Not done  Rectal:   not done  Extremities:   Without deformities noted     Skin:   Warm and dry without rash or lesions noted. Dressing to right subclavian area      Neurologic:   MAE x 4 CAM-ICU  - confused . Orientation to self only - agitated and restrained, selectively follows commands     PULMONARY No results for input(s): PHART, PCO2ART, PO2ART, HCO3, TCO2, O2SAT in the last 168 hours.  Invalid input(s): PCO2, PO2  CBC  Recent Labs Lab 03/11/17 0600 03/12/17 0500 03/13/17 0422  HGB 14.2 12.2* 12.3*  HCT 43.6 37.7* 36.7*  WBC 12.8* 10.0 7.6  PLT 161 189 192    COAGULATION  Recent Labs Lab 03/10/17 0803  INR 0.98    CARDIAC  No results for input(s): TROPONINI in the last 168 hours. No results for input(s): PROBNP in the last 168 hours.   CHEMISTRY  Recent Labs Lab 03/10/17 0549  03/11/17 0855 03/11/17 1215  03/12/17 0500 03/12/17 1042 03/12/17 1627 03/13/17 0045 03/13/17 0422  NA 137  < > 140 140  < > 146* 147* 144 143 142  K 3.2*  --  7.2* >7.5*  --  3.1*  --   --  3.2*  --   CL 107  --  117* 116*  --  121*  --   --  116*  --   CO2 22  --  18* 18*  --  19*  --   --  19*  --   GLUCOSE 113*  --  112* 115*  --  105*  --   --  98  --   BUN 14  --  11 10  --  12  --   --  7  --   CREATININE 1.47*  --  1.36* 1.40*  --  1.44*  --   --  1.25*  --   CALCIUM 9.1  --  8.5* 8.5*  --  8.5*  --   --  8.4*  --   MG  --   --   --   --   --   --   --   --   --  1.7  PHOS  --   --   --   --   --   --   --   --   --  1.6*  < > = values in this interval not displayed. Estimated Creatinine Clearance: 67.9 mL/min (A) (by C-G formula based on SCr of 1.25 mg/dL (H)).   LIVER  Recent Labs Lab 03/08/17 2105 03/10/17 0803  AST 30  --   ALT 14*  --   ALKPHOS 72  --   BILITOT 0.7  --   PROT 8.0  --   ALBUMIN 4.1  --   INR  --  0.98     INFECTIOUS No results for input(s): LATICACIDVEN, PROCALCITON in the last 168 hours.   ENDOCRINE CBG (last 3)  No results for input(s): GLUCAP in the last 72 hours.       IMAGING x48h  - image(s) personally visualized  -   highlighted in bold Ct Head Wo Contrast  Result Date: 03/13/2017 CLINICAL DATA:   Follow-up stroke. EXAM: CT HEAD WITHOUT CONTRAST TECHNIQUE: Contiguous axial images were obtained from the  base of the skull through the vertex without intravenous contrast. COMPARISON:  MRI of the head March 08, 2017 and CT HEAD March 10, 2017 FINDINGS: BRAIN: Evolving LEFT cerebellar infarct with ex cerebellar folia effacement. Similar mass effect on the fourth ventricle with mild hydrocephalus. No new infarcts. No hemorrhagic conversion. Mildly effaced super of sellar cistern is similar. Patchy supratentorial white matter hypodensities unchanged. Old small LEFT occipital lobe infarcts. VASCULAR: Trace calcific atherosclerosis of the carotid siphons. SKULL: No skull fracture. Old bilateral nasal bone fractures. Small RIGHT frontal scalp lipoma. No significant scalp soft tissue swelling. SINUSES/ORBITS: Trace RIGHT mastoid effusion. Old LEFT medial orbital blowout fracture. OTHER: None. IMPRESSION: Evolving LEFT posterior-inferior cerebellar artery territory infarct nearly effacing the fourth ventricle resulting in stable mild hydrocephalus. Moderate chronic small vessel ischemic disease and old small LEFT occipital lobe infarct. Electronically Signed   By: Elon Alas M.D.   On: 03/13/2017 04:51       DISCUSSION: Left cerebellar stroke. Presume cocaine induced. Now w/ probably ETOH w/d. Worsening agitation and delirium despite  precedex at 1.2 mcg.[name] PRN versed, Refusing Seroquel.  BP goal <180/105, cont 3% protocol, f/u chemistries.   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 Source unclear ? Af vs cocaine related vasospasm Acute encephalopathy: presumed Delirium tremens/   h/o substance abuse     03/13/2017 - agitated encephalopathy  Despite  precedex, restraints and sitter   P:   RASS goal: 0 to -1 precedex gtt Seroquel  12 Lead EKG now Will Add Haldol if QTc allows Thiamine and folate Aspirin for stroke prevention  3% saline  infusion protocol Serial imaging per neuro and neuro-surg services re: watching for obstructive hydrocephalus   Pulmonary  A: Elevated right HD Room Air SATs 94%  03/13/2017  - protecting airway P: Titrate oxygen to Maintain saturations > 93% Pulmonary Toilet Mobilize as able  CARDIOVASCULAR A:  HTN Tachycardia  hyperlipidemia  P:  Allowing for permissive HTN <180/105 Tele monitoring  Maintain MAP > 65 Cont lipitor   RENAL A:   CKD stage IV  Hyperchloremia  NAGMA Hypokalemia  03/13/2017 - No Acute Issues P:   Trend BMET daily Replete electrolytes as needed Strict I&O Hypernatremia protocol  Avoid nephrotoxic medications Maintain renal perfussion   GASTROINTESTINAL A:   Aspiration risk P:   Aspiration precautions  SUP  HEMATOLOGIC A:   RLE DVT P:  IVC filter Trend CBC Eventually decide of systemic a/c     FAMILY  - No Family at bedside  - Inter-disciplinary family meet or Palliative Care meeting due by:  5/2  Magdalen Spatz, AGACNP-BC [place] Pager # [phone]  03/13/2017 7:26 AM

## 2017-03-13 NOTE — Progress Notes (Signed)
[facility] Progress Note Patient Name: Darryl Parsons DOB: [birth year] MRN: [id number]   Date of Service  03/13/2017  HPI/Events of Note  Hypokalemia  eICU Interventions  Potassium replaced     Intervention Category Intermediate Interventions: Electrolyte abnormality - evaluation and management  DETERDING,ELIZABETH 03/13/2017, 2:02 AM

## 2017-03-14 DIAGNOSIS — F10239 Alcohol dependence with withdrawal, unspecified: Secondary | ICD-10-CM

## 2017-03-14 DIAGNOSIS — F1490 Cocaine use, unspecified, uncomplicated: Secondary | ICD-10-CM

## 2017-03-14 DIAGNOSIS — F17210 Nicotine dependence, cigarettes, uncomplicated: Secondary | ICD-10-CM

## 2017-03-14 LAB — SODIUM
SODIUM: 141 mmol/L (ref 135–145)
SODIUM: 138 mmol/L (ref 135–145)
SODIUM: 138 mmol/L (ref 135–145)

## 2017-03-14 LAB — CBC WITH DIFFERENTIAL/PLATELET
BASOS ABS: 0 10*3/uL (ref 0.0–0.1)
BASOS PCT: 0 %
EOS ABS: 0.1 10*3/uL (ref 0.0–0.7)
Eosinophils Relative: 1 %
HCT: 38.7 % — ABNORMAL LOW (ref 39.0–52.0)
Hemoglobin: 13.2 g/dL (ref 13.0–17.0)
Lymphocytes Relative: 23 %
Lymphs Abs: 2 10*3/uL (ref 0.7–4.0)
MCH: 28.1 pg (ref 26.0–34.0)
MCHC: 34.1 g/dL (ref 30.0–36.0)
MCV: 82.5 fL (ref 78.0–100.0)
MONOS PCT: 6 %
Monocytes Absolute: 0.5 10*3/uL (ref 0.1–1.0)
NEUTROS PCT: 70 %
Neutro Abs: 6.1 10*3/uL (ref 1.7–7.7)
Platelets: 195 10*3/uL (ref 150–400)
RBC: 4.69 MIL/uL (ref 4.22–5.81)
RDW: 15 % (ref 11.5–15.5)
WBC: 8.7 10*3/uL (ref 4.0–10.5)

## 2017-03-14 LAB — MAGNESIUM: Magnesium: 2.1 mg/dL (ref 1.7–2.4)

## 2017-03-14 LAB — PHOSPHORUS: Phosphorus: 2.9 mg/dL (ref 2.5–4.6)

## 2017-03-14 MED ORDER — ZIPRASIDONE MESYLATE 20 MG IM SOLR
10.0000 mg | INTRAMUSCULAR | Status: DC | PRN
  Administered 2017-03-18: 10 mg via INTRAMUSCULAR
  Filled 2017-03-14 (×3): qty 20

## 2017-03-14 NOTE — Progress Notes (Addendum)
Pt. has become confused, agitated, combative.  Pt. currently with sitter , restraints and on precedex.  I will attempt to get phone number of significant other/caregive to determine level of support should pt. become more appropriate for IP rehab.  Please call if questions.  [place] Admissions Coordinator Cell [phone] Office [phone]  1227 Addendum:  Pt. awake while I was in the room.  He seems suspicious of my questions regarding how to contact his significant other, Dell Ponto.  He cannot or will not provide info on how to contact her, yet says he wants to talk to her.  I phoned the number on pt's facesheet and reached the [facility].  The receptionist was able to tell me Ms. Gerringer does not live at [facility] any longer.  Nurse mentions she may be residing at [employer] [facility].  I will attempt to contact her at [facility] to discuss any caregiver support pt. may have once he is medically more stable and ready for next venue of care. If pt. resides in a motel, anticipate he will need SNF placement at time discharge from acute.    Gerlean Ren

## 2017-03-14 NOTE — Progress Notes (Signed)
Patient at times verbally abusive with racial comments toward the sitter and nurse. States to sitter that he will find her and shoot her in the face. States to nurse that he will find her at her car and cut her. When MD at bedside patient states that he will take his medications that he had refused. When medication offered to patient for the second time patient took his B/P med and threw the others away. Patient up to side of bed for meal and was refusing to lay down for safety reasons security called to bedside for stand by assistance. Patient cooperated with lying down.

## 2017-03-14 NOTE — Progress Notes (Signed)
Physical Therapy Treatment Patient Details Name: Darryl Parsons MRN: [id number] DOB: [birth year] Today's Date: 03/14/2017    History of Present Illness Pt is a 61 y.o. male admitted to ED on 03/08/17 with severe headache, dizziness, nausea and vomiting. MRI on 4/24 showed large acute infarct of L PICA and small L occipital acute infarct. Pt s/p IVC filter placement 4/26. Pertinent PMH includes mental disorder, HTN, CKD, LBP. [name] Became very combative and was verbally threatening staff morning of 4/29.    PT Comments    Pt cussing and threatening staff. Pt resistant to physical assist limiting amb to 5' due to impulsivity, impaired balance, and multiple lines that he was tripping over/pulling out. Acute PT to con't to follow.   Follow Up Recommendations  CIR;Supervision/Assistance - 24 hour     Equipment Recommendations       Recommendations for Other Services Rehab consult     Precautions / Restrictions Precautions Precautions: Fall Restrictions Weight Bearing Restrictions: No    Mobility  Bed Mobility Overal bed mobility: Needs Assistance Bed Mobility: Supine to Sit     Supine to sit: Min assist     General bed mobility comments: mina for safety and trunk elevation, mildly labored effort  Transfers Overall transfer level: Needs assistance Equipment used: 2 person hand held assist Transfers: Sit to/from Stand Sit to Stand: Min assist;Mod assist;+2 physical assistance         General transfer comment: pt initially allowed PT and tech to assist but then started pushing staff away due to "why you keep jostling me"  Ambulation/Gait Ambulation/Gait assistance: Mod assist;Max assist;+2 physical assistance Ambulation Distance (Feet): 5 Feet (x2) Assistive device: 2 person hand held assist Gait Pattern/deviations: Step-through pattern;Decreased stride length;Wide base of support Gait velocity: slow Gait velocity interpretation: Below normal speed for age/gender General  Gait Details: pt very unsteady, pt strongly resisting physical assist and pushing away from staff. pt very usnteady requiring assist to prevent fall. Pt unaware of lines in his neck requiring max v/c's to prevent from pulling out. pt assist to the bed    Stairs            Wheelchair Mobility    Modified Rankin (Stroke Patients Only) Modified Rankin (Stroke Patients Only) Pre-Morbid Rankin Score: No symptoms Modified Rankin: Moderately severe disability     Balance Overall balance assessment: Needs assistance Sitting-balance support: No upper extremity supported Sitting balance-Leahy Scale: Fair Sitting balance - Comments: pt able to sit and eat at the bedside with supervision   Standing balance support: Bilateral upper extremity supported Standing balance-Leahy Scale: Poor Standing balance comment: requires physical assist                            Cognition Arousal/Alertness: Awake/alert Behavior During Therapy: Agitated Overall Cognitive Status: No family/caregiver present to determine baseline cognitive functioning Area of Impairment: Awareness;Problem solving;Safety/judgement;Following commands                       Following Commands:  (pt chooses not too) Safety/Judgement: Decreased awareness of safety;Decreased awareness of deficits (pt trying to walk on own and tripping over lines ) Awareness: Emergent Problem Solving: Slow processing General Comments: pt initially calm and cooperative. pt then became very agitated after Dr. Lenna Sciara came to visit. Pt  taking inappropriately and aggressively towards clinicians, not coherent. talking about how God is a title and his real name is Darryl Parsons, pointing to his L  thigh saying he's marked.      Exercises      General Comments        Pertinent Vitals/Pain Pain Assessment: No/denies pain    Home Living                      Prior Function            PT Goals (current goals can now be found  in the care plan section) Acute Rehab PT Goals Patient Stated Goal: "i want to see some sunshine Progress towards PT goals: Not progressing toward goals - comment (limited to agitiation and impaired cognition)    Frequency    Min 4X/week      PT Plan Current plan remains appropriate    Co-evaluation              AM-PAC PT "6 Clicks" Daily Activity  Outcome Measure  Difficulty turning over in bed (including adjusting bedclothes, sheets and blankets)?: A Little Difficulty moving from lying on back to sitting on the side of the bed? : A Little Difficulty sitting down on and standing up from a chair with arms (e.g., wheelchair, bedside commode, etc,.)?: A Lot Help needed moving to and from a bed to chair (including a wheelchair)?: A Lot Help needed walking in hospital room?: A Lot Help needed climbing 3-5 steps with a railing? : A Lot 6 Click Score: 14    End of Session Equipment Utilized During Treatment: Gait belt Activity Tolerance: Treatment limited secondary to agitation Patient left: with call bell/phone within reach;with nursing/sitter in room;in bed Nurse Communication: Mobility status PT Visit Diagnosis: Difficulty in walking, not elsewhere classified (R26.2)     Time: 3016-0109 PT Time Calculation (min) (ACUTE ONLY): 25 min  Charges:  $Gait Training: 8-22 mins $Therapeutic Activity: 8-22 mins                    G Codes:      Kittie Plater, PT, DPT Pager #: [phone] Office #: [phone]   [place] 03/14/2017, 5:06 PM

## 2017-03-14 NOTE — Progress Notes (Signed)
Pt seen and examined. No issues overnight.  EXAM: Temp:  [97.7 F (36.5 C)-99 F (37.2 C)] 97.8 F (36.6 C) (04/30 0400) Pulse Rate:  [55-177] 67 (04/30 0700) Resp:  [10-36] 15 (04/30 0700) BP: (121-198)/(75-133) 154/106 (04/30 0700) SpO2:  [92 %-100 %] 96 % (04/30 0700) Intake/Output      04/29 0701 - 04/30 0700 04/30 0701 - 05/01 0700   P.O. 1020    I.V. (mL/kg) 1118 (13)    IV Piggyback 873    Total Intake(mL/kg) 3011 (35.1)    Urine (mL/kg/hr) 3650 (1.8)    Total Output 3650     Net -639           Awake and alert, argumentative Follows commands throughout Full strength  Stable No signs of hydrocephalus or brainstem compression Unlikely to need surgery

## 2017-03-14 NOTE — Progress Notes (Signed)
PULMONARY / CRITICAL CARE MEDICINE   Name: Darryl Parsons MRN: [id number] DOB: [birth year]    ADMISSION DATE:  03/08/2017 CONSULTATION DATE:  4/27  REFERRING MD:  Erlinda Hong  CHIEF COMPLAINT:  Agitated delirium w/ concern for ETOH w/d   brief This is a 61 year old male who presented to the ER on 4/25 w/ cc: severe HA, dizziness, N&V. Dx eval demonstrated: large acute left PICA territory/cerebellar infarct w/ associated cerebral edema w/ some associated mid-line shift.  ->has h/o cocaine abuse (including the night prior)raising concern that cocaine induced vasospasm may have precipitated the event. Agitation and delirium have been worsening despite precedex initiation, and Ativan per CIWA protocol.  ES:  MR brain 4/24: 1. Truncated examination due to patient refusal to complete all sequences.2. 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 The basal cisterns and fourth ventricle remain patent without obstructive hydrocephalus. No acute hemorrhage. 3. Small left occipital acute infarct. 4. Chronic microvascular ischemia and scattered punctate foci of chronic microhemorrhage. CT head 4/26: 1. Continued moderate to severe left cerebellar edema with interval mildly increased mass effect on the fourth ventricle and early lateral ventriculomegaly - mild enlargement of the temporal horn since yesterday but no transependymal edema is evident. 2. No hemorrhagic transformation or new intracranial abnormality. 4/27: 1. 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 2. Left AICA is not clearly visualized, but the AICA origin and caliber are highly variable and it is often not identified on scans of normal patients.3. Unchanged left cerebellar cytotoxic edema with mass effect on the fourth ventricle and basal cisterns. Crowding of the foramen magnum is also unchanged.4. No  hemorrhage or hydrocephalus.  CULTURES: 4/25>> MRSA Negative  ANTIBIOTICS: None  Hospital course  4/25 admitted to NICU (did not get TPA d/t being outside window. Placed on CIWA protocol. Started on 3% saline. Allowing for permissive HTN (goal <220/120. Venous US obtained: + DVT RLE.  4/26 IVC filter placed. PCCM asked to place CVL for 3%. Left IJ CVL 4/26>>> 4/27 increased agitation. Hitting at staff. CT unchanged from earlier f/u CT day prior. PCCM asked to assess for precedex   SUBJECTIVE/OVERNIGHT/INTERVAL HX Confused but arousable, combative on precedex at 0.3  VITAL SIGNS: BP (!) 164/105   Pulse 98   Temp 98.3 F (36.8 C) (Axillary)   Resp 20   Ht 5\' 8"  (1.727 m)   Wt 85.9 kg (189 lb 6 oz)   SpO2 95%   BMI 28.79 kg/m   INTAKE / OUTPUT: I/O last 3 completed shifts: In: 4494.6 [P.O.:1200; I.V.:2156.6; IV Piggyback:1138] Out: 5900 [Urine:5900]  General Appearance:    Confused, not following commands, combative and agitated.  Head:    Normocephalic, without obvious abnormality, atraumatic  Eyes:    PERRL - conjunctiva and corneas clear      Ears:    Normal external ear canals, both ears, without drainage  Nose:   WNL     Neck:   Supple,  No enlargement/tenderness/nodules     Lungs:     Clear throughout, slightly diminished per bases  Chest wall:    No obvious deformity noted  Heart:    S1 and S2 normal, no MRG  Abdomen:     Soft, BS+, non-tender, non-distended  Genitalia:    Not done  Rectal:   not done  Extremities:   Without deformities noted     Skin:  Warm and dry without rash or lesions noted. Dressing to right subclavian area      Neurologic:   MAE x 4 CAM-ICU - confused . Orientation to self only - agitated and restrained, selectively follows commands   PULMONARY No results for input(s): PHART, PCO2ART, PO2ART, HCO3, TCO2, O2SAT in the last 168 hours.  Invalid input(s): PCO2, PO2  CBC  Recent Labs Lab 03/11/17 0600 03/12/17 0500 03/13/17 0422   HGB 14.2 12.2* 12.3*  HCT 43.6 37.7* 36.7*  WBC 12.8* 10.0 7.6  PLT 161 189 192   COAGULATION  Recent Labs Lab 03/10/17 0803  INR 0.98   CARDIAC  No results for input(s): TROPONINI in the last 168 hours. No results for input(s): PROBNP in the last 168 hours.  CHEMISTRY  Recent Labs Lab 03/10/17 0549  03/11/17 0855 03/11/17 1215  03/12/17 0500  03/13/17 0045 03/13/17 0422 03/13/17 1105 03/13/17 1705 03/13/17 2259 03/14/17 0535  NA 137  < > 140 140  < > 146*  < > 143 142 141 139 136 141  K 3.2*  --  7.2* >7.5*  --  3.1*  --  3.2*  --   --   --   --   --   CL 107  --  117* 116*  --  121*  --  116*  --   --   --   --   --   CO2 22  --  18* 18*  --  19*  --  19*  --   --   --   --   --   GLUCOSE 113*  --  112* 115*  --  105*  --  98  --   --   --   --   --   BUN 14  --  11 10  --  12  --  7  --   --   --   --   --   CREATININE 1.47*  --  1.36* 1.40*  --  1.44*  --  1.25*  --   --   --   --   --   CALCIUM 9.1  --  8.5* 8.5*  --  8.5*  --  8.4*  --   --   --   --   --   MG  --   --   --   --   --   --   --   --  1.7  --   --   --  2.1  PHOS  --   --   --   --   --   --   --   --  1.6*  --   --   --  2.9  < > = values in this interval not displayed. Estimated Creatinine Clearance: 67.9 mL/min (A) (by C-G formula based on SCr of 1.25 mg/dL (H)).  LIVER  Recent Labs Lab 03/08/17 2105 03/10/17 0803  AST 30  --   ALT 14*  --   ALKPHOS 72  --   BILITOT 0.7  --   PROT 8.0  --   ALBUMIN 4.1  --   INR  --  0.98   INFECTIOUS No results for input(s): LATICACIDVEN, PROCALCITON in the last 168 hours.  ENDOCRINE CBG (last 3)  No results for input(s): GLUCAP in the last 72 hours.  IMAGING x48h  - image(s) personally visualized  -   highlighted in bold Ct Head Wo Contrast  Result Date: 03/13/2017 CLINICAL DATA:  Follow-up stroke. EXAM: CT HEAD WITHOUT CONTRAST TECHNIQUE: Contiguous axial images were obtained from the base of the skull through the vertex without  intravenous contrast. COMPARISON:  MRI of the head March 08, 2017 and CT HEAD March 10, 2017 FINDINGS: BRAIN: Evolving LEFT cerebellar infarct with ex cerebellar folia effacement. Similar mass effect on the fourth ventricle with mild hydrocephalus. No new infarcts. No hemorrhagic conversion. Mildly effaced super of sellar cistern is similar. Patchy supratentorial white matter hypodensities unchanged. Old small LEFT occipital lobe infarcts. VASCULAR: Trace calcific atherosclerosis of the carotid siphons. SKULL: No skull fracture. Old bilateral nasal bone fractures. Small RIGHT frontal scalp lipoma. No significant scalp soft tissue swelling. SINUSES/ORBITS: Trace RIGHT mastoid effusion. Old LEFT medial orbital blowout fracture. OTHER: None. IMPRESSION: Evolving LEFT posterior-inferior cerebellar artery territory infarct nearly effacing the fourth ventricle resulting in stable mild hydrocephalus. Moderate chronic small vessel ischemic disease and old small LEFT occipital lobe infarct. Electronically Signed   By: Elon Alas M.D.   On: 03/13/2017 04:51   DISCUSSION: Left cerebellar stroke. Presume cocaine induced. Now w/ probably ETOH w/d. Worsening agitation and delirium despite  precedex at 0.6 mcg.[name] PRN versed, Refusing Seroquel.  BP goal <180/105, cont 3% protocol, f/u chemistries.   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 Source unclear ? Af vs cocaine related vasospasm Acute encephalopathy: presumed Delirium tremens/   h/o substance abuse    03/14/2017 - agitated encephalopathy  Despite  precedex, restraints and sitter  P:   RASS goal: 0 to -1 Precedex gtt, increase given agitation Add clonidine PO to spare some precedex use Continue Seroquel  12 Lead EKG now Thiamine and folate Aspirin for stroke prevention  3% saline infusion protocol complete Serial imaging per neuro and neuro-surg services re: watching for obstructive hydrocephalus    Pulmonary  A: Elevated right HD Room Air SATs 94%  03/14/2017  - protecting airway P: Titrate oxygen to Maintain saturations > 93% Pulmonary Toilet Mobilize as able  CARDIOVASCULAR A:  HTN Tachycardia  Hyperlipidemia  P:  Allowing for permissive HTN <180/105 Tele monitoring  Maintain MAP > 65 Cont lipitor  Clonidine 0.1 q8 if able to take PO with holding parameters for SBP <= 150  RENAL A:   CKD stage IV  Hyperchloremia  NAGMA Hypokalemia  03/14/2017 - No Acute Issues P:   Trend BMET daily Replete electrolytes as needed Strict I&O Hypernatremia protocol  Avoid nephrotoxic medications Maintain renal perfussion  GASTROINTESTINAL A:   Aspiration risk P:   Aspiration precautions  SUP  HEMATOLOGIC A:   RLE DVT P:  IVC filter Trend CBC Eventually decide of systemic a/c  FAMILY  - No Family at bedside  - Inter-disciplinary family meet or Palliative Care meeting due by:  5/2  Hold in the ICU, add clonidine to spare precedex, as long as precedex is needed patient will be in the ICU.  The patient is critically ill with multiple organ systems failure and requires high complexity decision making for assessment and support, frequent evaluation and titration of therapies, application of advanced monitoring technologies and extensive interpretation of multiple databases.   Critical Care Time devoted to patient care services described in this note is  35  Minutes. This time reflects time of care of this signee Dr Jennet Maduro. This critical care time does not reflect procedure time, or teaching time or supervisory time of PA/NP/Med student/Med Resident etc but could  involve care discussion time.  Rush Farmer, M.D. [facility] Pulmonary/Critical Care Medicine. Pager: [phone]. After hours pager: [phone].  03/14/2017 10:53 AM

## 2017-03-14 NOTE — Progress Notes (Signed)
STROKE TEAM PROGRESS NOTE   SUBJECTIVE (INTERVAL HISTORY) Patient remains on Precedex and in restraints.  but still needs sitter. Discussed with CCM . He continues to be verbally aggressive to staff and wants to be unrestained    CT head 03/13/17 showed evolving LEFT posterior-inferior cerebellar artery territory infarct nearly effacing the fourth ventricle resulting in stable mild hydrocephalus.   OBJECTIVE Temp:  [97.7 F (36.5 C)-99.7 F (37.6 C)] 99.7 F (37.6 C) (04/30 1200) Pulse Rate:  [55-177] 70 (04/30 1400) Cardiac Rhythm: Normal sinus rhythm (04/30 0800) Resp:  [10-36] 16 (04/30 1400) BP: (121-198)/(75-133) 139/107 (04/30 1400) SpO2:  [94 %-100 %] 94 % (04/30 1400)  CBC:   Recent Labs Lab 03/13/17 0422 03/14/17 1220  WBC 7.6 8.7  NEUTROABS 4.5 6.1  HGB 12.3* 13.2  HCT 36.7* 38.7*  MCV 83.0 82.5  PLT 192 209    Basic Metabolic Panel:  Recent Labs Lab 03/12/17 0500  03/13/17 0045 03/13/17 0422  03/14/17 0535 03/14/17 1105  NA 146*  < > 143 142  < > 141 138  K 3.1*  --  3.2*  --   --   --   --   CL 121*  --  116*  --   --   --   --   CO2 19*  --  19*  --   --   --   --   GLUCOSE 105*  --  98  --   --   --   --   BUN 12  --  7  --   --   --   --   CREATININE 1.44*  --  1.25*  --   --   --   --   CALCIUM 8.5*  --  8.4*  --   --   --   --   MG  --   --   --  1.7  --  2.1  --   PHOS  --   --   --  1.6*  --  2.9  --   < > = values in this interval not displayed.  Lipid Panel:     Component Value Date/Time   CHOL 226 (H) 03/09/2017 0408   TRIG 85 03/09/2017 0408   HDL 74 03/09/2017 0408   CHOLHDL 3.1 03/09/2017 0408   VLDL 17 03/09/2017 0408   LDLCALC 135 (H) 03/09/2017 0408   HgbA1c:  Lab Results  Component Value Date   HGBA1C <4.2 (L) 03/09/2017   Urine Drug Screen:     Component Value Date/Time   LABOPIA POSITIVE (A) 03/09/2017 0930   COCAINSCRNUR POSITIVE (A) 03/09/2017 0930   COCAINSCRNUR (A) 12/09/2007 0230    POSITIVE (NOTE) Result  repeated and verified. Sent for confirmatory testing   LABBENZ POSITIVE (A) 03/09/2017 0930   LABBENZ NEGATIVE 12/09/2007 0230   AMPHETMU NONE DETECTED 03/09/2017 0930   THCU NONE DETECTED 03/09/2017 0930   LABBARB NONE DETECTED 03/09/2017 0930    Alcohol Level     Component Value Date/Time   ETH <5 10/06/2016 4709    IMAGING I have personally reviewed the radiological images below and agree with the radiology interpretations.  [name] 03/08/2017 1. Truncated examination due to patient refusal to complete all sequences.  2. 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 The basal cisterns and fourth ventricle remain patent without obstructive hydrocephalus. No acute hemorrhage.  3.  Small left occipital acute infarct.  4. Chronic microvascular ischemia and scattered punctate foci of chronic microhemorrhage.   Ct Head Wo Contrast 03/10/2017 1. Continued moderate to severe left cerebellar edema with interval mildly increased mass effect on the fourth ventricle and early lateral ventriculomegaly - mild enlargement of the temporal horn since yesterday but no transependymal edema is evident.  2. No hemorrhagic transformation or new intracranial abnormality.    LE venous doppler - Positive for acute deep vein thrombosis involving the right peroneal veins and a short segment of the right posterior tibial veins at the proximal and mid level, and thrombus in the proximal muscular calf gastro veins.  TTE - pt not cooperative due to delirium  CTA Head and Neck  03/11/2017 1. 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 2. Left AICA is not clearly visualized, but the AICA origin and caliber are highly variable and it is often not identified on scans of normal patients. 3. Unchanged left cerebellar cytotoxic edema with mass effect on the fourth  ventricle and basal cisterns. Crowding of the foramen magnum is also unchanged. 4. No hemorrhage or hydrocephalus.   PHYSICAL EXAM  Temp:  [97.7 F (36.5 C)-99.7 F (37.6 C)] 99.7 F (37.6 C) (04/30 1200) Pulse Rate:  [55-177] 70 (04/30 1400) Resp:  [10-36] 16 (04/30 1400) BP: (121-198)/(75-133) 139/107 (04/30 1400) SpO2:  [94 %-100 %] 94 % (04/30 1400)  General - Well nourished, well developed, delirium with agitation and confusion.  Ophthalmologic - Fundi not visualized due to noncooperation.  Cardiovascular - Regular rate and rhythm.  Neuro -  Drowsy but arouses easily and will follow commands. Diminished attention, registration and recall.[name]  PERRL, facial symmetrical, tongue midline. Moving BUE and BLE equally,  DTR 1+. Sensation symmetrical, gait not tested.    ASSESSMENT/PLAN Darryl Parsons is a 61 y.o. male with history of HTN, CKD, cocaine use and mental disorder presenting with severe HA, nausea, vomiting, dizziness. He did not receive IV t-PA due to delay in arrival.   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 Thrombotic due to cocaine use  Resultant  Nystagmus and left eye abduction deficit, left UE mild dysmetria  NS consulted (Pool). ICU monitoring with f/u imaging for hydrocephalus eval  MRI  Truncated d/t pt refusal. Large L PICA infarct w/ cytotoxic edea and Rightward shift. No hydrocephalus. No hmg. Small L occipital infarct.   Repeat CT head mild hydrocephalus  LE venous Doppler  Right LE DVT  2D  Echo  Pt not cooperative  CTA head and neck - Non-opacification of the left PICA, in keeping with acute left PICA territory infarct.  Consider outpt 30 day cardiac event monitoring to rule out afib as outpt  UDS - positive cocaine, opiates and benzo  LDL 135  HgbA1c < 4.2  Lovenox 40 mg sq daily for VTE prophylaxis  Diet Heart Room service appropriate? Yes; Fluid consistency: Thin.   No antithrombotic prior to admission, now on aspirin 325 mg daily.   Patient counseled to be  compliant with his antithrombotic medications  Ongoing aggressive stroke risk factor management  Therapy recommendations:  CIR   Disposition:  Pending  Cerebellar edema / hydrocephalus  NSG on board - no intervention needed at this time  Repeat CT no significant hydrocephalus  Close neuro check  On 3% saline, increase to 75cc/h  Na 139 -> 143 -> 140  Had 23.4% once last night  Right LE DVT  Not candidate for anticoagulation  IVC filter placed  ? Alcohol withdraw  Pt admitted drinking alcohol  Not sure the amount and time of last drink  On CIWA protocol  B1/FA/MVI  Hypertension  BP 160-170s on arrival  On amlodipine, clonidine, HCTZ and metoprolol at home  Hydralazine PRN for BP control  Permissive hypertension (OK if < 180/105) but gradually normalize in 5-7 days  Long-term BP goal normotensive  Hyperlipidemia  Home meds:  No statin  LDL 135, goal < 70  Add lipitor 40mg   Cocaine abuse  UDS positive cocaine  Cessation counseling will be provided  Tobacco abuse  Current smoker  Smoking cessation counseling provided  Pt is willing to quit  Other Stroke Risk Factors     Other Active Problems  CKD Cre 1.52->1.47 -> 1.36  Agitation and depression - On zoloft  Psychosis - on seroquel   Mild leukocytosis - 12.8   Hyperkalemia - 7.2 (hemolysis) - repeat Bmet pending  Hospital day # 5 Discussed with Dr. Nelda Marseille. Continue present care.Psych consult.  Continue haldol and trying to wean Precedex. Appreciate pulmonary critical care and neurosurgery help This patient is critically ill due to large cerebellar infarct, cocaine use, delirium, agitation, alcohol withdraw and at significant risk of neurological worsening, death form recurrent stroke, hydrocephalus, cerebral edema, heart failure, MI, and DT. This patient's care requires constant monitoring of vital signs, hemodynamics, respiratory and cardiac monitoring, review of multiple databases, neurological assessment, discussion with family, other specialists and medical decision making of high complexity. I spent 30 minutes of neurocritical care time in the care of this patient.  Antony Contras, MD Medical Director [place] Pager: [phone] 03/14/2017 2:23 PM   To contact Stroke Continuity provider, please refer to [email or web address] After hours, contact General Neurology

## 2017-03-14 NOTE — Consult Note (Signed)
[place] Psychiatry Consult   Reason for Consult:  Psychosis and history of schizophrenia Referring Physician:  Dr. Nelda Marseille Patient Identification: Darryl Parsons MRN:  [id number] Principal Diagnosis: Schizoaffective disorder, depressive type [facility]) Diagnosis:   Patient Active Problem List   Diagnosis Date Noted  . Vertigo [R42]   . DVT (deep vein thrombosis) in pregnancy ([place]) [O22.30, I82.409]   . Central venous catheter in place [Z78.9]   . 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 Posterior circulation stroke ([place]) [I63.50] 03/09/2017  . Stroke (cerebrum) ([place]) [I63.9] 03/09/2017  . Hypovitaminosis [E56.9]   . Tobacco use disorder [F17.200]   . Overdose [T50.901A] 10/06/2016  . Schizoaffective disorder, depressive type ([place]) [F25.1] 08/27/2016  . Cocaine use disorder, mild, abuse [F14.10] 08/27/2016  . Essential hypertension [I10] 08/27/2016  . Chronic headache [R51] 08/27/2016  . Suicidal ideation [R45.851] 02/03/2016  . Encephalopathy acute [G93.40] 01/30/2016  . CKD (chronic kidney disease) stage 4, GFR 15-29 ml/min (HCC) [N18.4] 01/30/2016  . Benign essential HTN [I10] 01/30/2016  . Cocaine abuse [F14.10] 08/21/2012  . Hypokalemia [E87.6] 08/21/2012    Total Time spent with patient: 1 hour  Subjective:   Darryl Parsons is a 61 y.o. male patient admitted with PICA CVA.  HPI:  Darryl Parsons is a 61 years old male with history of schizoaffective disorder, cocaine use disorder admitted to [facility] with PICA cerebellar infarct CVA. Psychiatric consultation requested for increased agitation, aggressive behavior and paranoid thoughts. Patient reported staff members are keeping him hostage, and misbehaving with him as if they own him, also reported some of them are not clinical staff members. Patient appeared in 4 point soft restraints and Posey since yesterday to prevent harm to himself and other staff members on  the unit. Patient stated he passed all of that behavior, asking to keep him off the restraints and also wishes to go outside for fresh whether and sunlight. Patient knows he has a stroke and at the same time he said his a mini stroke and also minimizes his drug of abuse including cocaine saying that his last use was 2 months ago. Patient urine drug screen is positive for cocaine and opiates and benzos on 03/09/2017. Patient is also noncompliant with psychiatric outpatient treatment services. Patient has a limited insight and judgment. Patient denies auditory/visual hallucinations but endorses mild paranoia during this evaluation.  Medical history: This is a 61 year old male who presented to the ER on 4/25 w/ cc: severe HA, dizziness, N&V. Dx eval demonstrated: large acute left PICA territory/cerebellar infarct w/ associated cerebral edema w/ some associated mid-line shift.  ->has h/o cocaine abuse (including the night prior)raising concern that cocaine induced vasospasm may have precipitated the event. Agitation and delirium have been worsening despite precedex initiation, and Ativan per CIWA protocol  Past Psychiatric History: Schizoaffective disorder and cocaine use disorder, last admission to [facility] is 08/26/2017.  Risk to Self: Is patient at risk for suicide?: No Risk to Others:   Prior Inpatient Therapy:   Prior Outpatient Therapy:    Past Medical History:  Past Medical History:  Diagnosis Date  . Chronic kidney disease   . Headache   . Hypertension   . Low back pain   . Mental disorder   . Schizophrenia [facility])     Past Surgical History:  Procedure Laterality Date  . IR IVC FILTER PLMT / S&I /IMG GUID/MOD SED  03/10/2017   Family History:  Family History  Problem Relation Age of Onset  . Cardiomyopathy Sister   . Lupus Sister   . Kidney failure Sister   . Factor VIII deficiency Neg Hx   . Mental illness Neg Hx    Family Psychiatric  History: Noncontributory Social History:   History  Alcohol Use  . Yes    Comment: 1-2 beers a month     History  Drug Use  . Types: Cocaine    Social History   Social History  . Marital status: Single    Spouse name: N/A  . Number of children: N/A  . Years of education: N/A   Social History Main Topics  . Smoking status: Current Every Day Smoker    Packs/day: 0.50    Types: Cigarettes  . Smokeless tobacco: Never Used  . Alcohol use Yes     Comment: 1-2 beers a month  . Drug use: Yes    Types: Cocaine  . Sexual activity: No   Other Topics Concern  . None   Social History Narrative  . None   Additional Social History:    Allergies:   Allergies  Allergen Reactions  . Other     Pt states he is allergic to a blood pressure pill. He is unable to state reaction. He is spelling it adelatt and aderlatt.    Labs:  Results for orders placed or performed during the hospital encounter of 03/08/17 (from the past 48 hour(s))  Sodium     Status: None   Collection Time: 03/12/17  4:27 PM  Result Value Ref Range   Sodium 144 135 - 145 mmol/L  Basic metabolic panel     Status: Abnormal   Collection Time: 03/13/17 12:45 AM  Result Value Ref Range   Sodium 143 135 - 145 mmol/L   Potassium 3.2 (L) 3.5 - 5.1 mmol/L   Chloride 116 (H) 101 - 111 mmol/L   CO2 19 (L) 22 - 32 mmol/L   Glucose, Bld 98 65 - 99 mg/dL   BUN 7 6 - 20 mg/dL   Creatinine, Ser 1.25 (H) 0.61 - 1.24 mg/dL   Calcium 8.4 (L) 8.9 - 10.3 mg/dL   GFR calc non Af Amer >60 >60 mL/min   GFR calc Af Amer >60 >60 mL/min    Comment: (NOTE) The eGFR has been calculated using the CKD EPI equation. This calculation has not been validated in all clinical situations. eGFR's persistently <60 mL/min signify possible Chronic Kidney Disease.    Anion gap 8 5 - 15  Magnesium     Status: None   Collection Time: 03/13/17  4:22 AM  Result Value Ref Range   Magnesium 1.7 1.7 - 2.4 mg/dL  Phosphorus     Status: Abnormal   Collection Time: 03/13/17  4:22 AM   Result Value Ref Range   Phosphorus 1.6 (L) 2.5 - 4.6 mg/dL  CBC with Differential/Platelet     Status: Abnormal   Collection Time: 03/13/17  4:22 AM  Result Value Ref Range   WBC 7.6 4.0 - 10.5 K/uL   RBC 4.42 4.22 - 5.81 MIL/uL   Hemoglobin 12.3 (L) 13.0 - 17.0 g/dL   HCT 36.7 (L) 39.0 - 52.0 %   MCV 83.0 78.0 - 100.0 fL   MCH 27.8 26.0 - 34.0 pg   MCHC 33.5 30.0 - 36.0 g/dL   RDW 15.1 11.5 - 15.5 %   Platelets 192 150 - 400 K/uL   Neutrophils Relative % 60 %   Neutro Abs  4.5 1.7 - 7.7 K/uL   Lymphocytes Relative 32 %   Lymphs Abs 2.5 0.7 - 4.0 K/uL   Monocytes Relative 7 %   Monocytes Absolute 0.5 0.1 - 1.0 K/uL   Eosinophils Relative 1 %   Eosinophils Absolute 0.1 0.0 - 0.7 K/uL   Basophils Relative 0 %   Basophils Absolute 0.0 0.0 - 0.1 K/uL  Sodium     Status: None   Collection Time: 03/13/17  4:22 AM  Result Value Ref Range   Sodium 142 135 - 145 mmol/L  Sodium     Status: None   Collection Time: 03/13/17 11:05 AM  Result Value Ref Range   Sodium 141 135 - 145 mmol/L  Sodium     Status: None   Collection Time: 03/13/17  5:05 PM  Result Value Ref Range   Sodium 139 135 - 145 mmol/L  Sodium     Status: None   Collection Time: 03/13/17 10:59 PM  Result Value Ref Range   Sodium 136 135 - 145 mmol/L  Magnesium     Status: None   Collection Time: 03/14/17  5:35 AM  Result Value Ref Range   Magnesium 2.1 1.7 - 2.4 mg/dL  Phosphorus     Status: None   Collection Time: 03/14/17  5:35 AM  Result Value Ref Range   Phosphorus 2.9 2.5 - 4.6 mg/dL  Sodium     Status: None   Collection Time: 03/14/17  5:35 AM  Result Value Ref Range   Sodium 141 135 - 145 mmol/L    Current Facility-Administered Medications  Medication Dose Route Frequency Provider Last Rate Last Dose  . acetaminophen (TYLENOL) tablet 650 mg  650 mg Oral Q4H PRN Kerney Elbe, MD   650 mg at 03/13/17 2117   Or  . acetaminophen (TYLENOL) solution 650 mg  650 mg Per Tube Q4H PRN Kerney Elbe, MD        Or  . acetaminophen (TYLENOL) suppository 650 mg  650 mg Rectal Q4H PRN Kerney Elbe, MD      . amLODipine (NORVASC) tablet 10 mg  10 mg Oral Daily David L Rinehuls, PA-C   10 mg at 03/13/17 1000  . aspirin EC tablet 325 mg  325 mg Oral Daily Rosalin Hawking, MD   325 mg at 03/13/17 1000  . atorvastatin (LIPITOR) tablet 40 mg  40 mg Oral q1800 Rosalin Hawking, MD   40 mg at 03/12/17 1749  . butalbital-acetaminophen-caffeine (FIORICET, ESGIC) 50-325-40 MG per tablet 2 tablet  2 tablet Oral Q8H PRN Rosalin Hawking, MD   2 tablet at 03/13/17 1215  . Chlorhexidine Gluconate Cloth 2 % PADS 6 each  6 each Topical Daily Rosalin Hawking, MD   6 each at 03/13/17 1600  . cloNIDine (CATAPRES - Dosed in mg/24 hr) patch 0.2 mg  0.2 mg Transdermal Q Sat David L Rinehuls, PA-C   0.2 mg at 03/12/17 1834  . dexmedetomidine (PRECEDEX) 400 MCG/100ML (4 mcg/mL) infusion  0.4-1.2 mcg/kg/hr Intravenous Titrated Rosalin Hawking, MD 12.9 mL/hr at 03/14/17 1100 0.6 mcg/kg/hr at 03/14/17 1100  . diazepam (VALIUM) injection 5 mg  5 mg Intravenous Q6H Kerney Elbe, MD   5 mg at 03/14/17 0500  . enoxaparin (LOVENOX) injection 40 mg  40 mg Subcutaneous Q24H Rosalin Hawking, MD   40 mg at 03/13/17 1500  . folic acid injection 1 mg  1 mg Intravenous Daily Rosalin Hawking, MD   1 mg at 03/13/17 1021  . haloperidol lactate (HALDOL) injection  5 mg  5 mg Intravenous Q6H Brand Males, MD   5 mg at 03/14/17 0624  . hydrALAZINE (APRESOLINE) injection 10-20 mg  10-20 mg Intravenous Q2H PRN Rosalin Hawking, MD   20 mg at 03/13/17 1900  . hydrOXYzine (ATARAX/VISTARIL) tablet 25 mg  25 mg Oral Q6H PRN Kerney Elbe, MD   25 mg at 03/13/17 0333  . midazolam (VERSED) injection 2 mg  2 mg Intravenous Q2H PRN Raylene Miyamoto, MD   2 mg at 03/13/17 2115  . multivitamin with minerals tablet 1 tablet  1 tablet Oral Daily Rosalin Hawking, MD   1 tablet at 03/13/17 1000  . ondansetron (ZOFRAN) injection 4 mg  4 mg Intravenous Q6H PRN Rosalin Hawking, MD   4 mg at 03/09/17 1245  . QUEtiapine  (SEROQUEL) tablet 300 mg  300 mg Oral QHS Rosalin Hawking, MD   300 mg at 03/13/17 2200  . senna-docusate (Senokot-S) tablet 1 tablet  1 tablet Oral QHS PRN Kerney Elbe, MD      . sertraline (ZOLOFT) tablet 50 mg  50 mg Oral Daily Kerney Elbe, MD   50 mg at 03/13/17 1000  . sodium chloride flush (NS) 0.9 % injection 10-40 mL  10-40 mL Intracatheter Q12H Rosalin Hawking, MD   30 mL at 03/13/17 2200  . sodium chloride flush (NS) 0.9 % injection 10-40 mL  10-40 mL Intracatheter PRN Rosalin Hawking, MD      . thiamine (VITAMIN B-1) tablet 100 mg  100 mg Oral Daily Rosalin Hawking, MD   100 mg at 03/12/17 0908   Or  . thiamine (B-1) injection 100 mg  100 mg Intravenous Daily Rosalin Hawking, MD   100 mg at 03/13/17 1041    Musculoskeletal: Strength & Muscle Tone: decreased Gait & Station: unable to stand Patient leans: N/A  Psychiatric Specialty Exam: Physical Exam as per history and physical   ROS paranoia, irritability, agitation, aggressive behaviors and threatening statements the last 24 hours. Patient has a limited insight, judgment and impulsive behaviors. Patient minimizes substance abuse. Patient believes he can walk. No Fever-chills, No Headache, No changes with Vision or hearing, reports vertigo No problems swallowing food or Liquids, No Chest pain, Cough or Shortness of Breath, No Abdominal pain, No Nausea or Vommitting, Bowel movements are regular, No Blood in stool or Urine, No dysuria, No new skin rashes or bruises, No new joints pains-aches,  No new weakness, tingling, numbness in any extremity, No recent weight gain or loss, No polyuria, polydypsia or polyphagia,  A full 10 point Review of Systems was done, except as stated above, all other Review of Systems were negative.   Blood pressure (!) 186/117, pulse 93, temperature 99.7 F (37.6 C), temperature source Axillary, resp. rate 18, height '5\' 8"'  (1.727 m), weight 85.9 kg (189 lb 6 oz), SpO2 96 %.Body mass index is 28.79 kg/m.  General  Appearance: Disheveled and Guarded  Eye Contact:  Good  Speech:  Clear and Coherent and Slow  Volume:  Decreased  Mood:  Depressed  Affect:  Constricted and Depressed  Thought Process:  Irrelevant  Orientation:  Full (Time, Place, and Person)  Thought Content:  Paranoid Ideation  Suicidal Thoughts:  No  Homicidal Thoughts:  No  Memory:  Immediate;   Fair Recent;   Fair Remote;   Fair  Judgement:  Impaired  Insight:  Shallow  Psychomotor Activity:  Decreased  Concentration:  Concentration: Fair and Attention Span: Fair  Recall:  [employer] of Knowledge:  Fair  Language:  Good  Akathisia:  Negative  Handed:  Right  AIMS (if indicated):     Assets:  Communication Skills Desire for Improvement Financial Resources/Insurance Leisure Time Resilience  ADL's:  Impaired  Cognition:  WNL  Sleep:        Treatment Plan Summary: 61 years old male with history of polysubstance abuse versus dependence including cocaine intoxication and also status post stroke secondary to possibly cocaine. Patient presented with paranoia and aggressive behaviors for the last 24 hours. Patient minimizes his behaviors and stated to staff members are mistreating him or abusing him.  Schizoaffective disorder most recent episode psychosis Polysubstance dependence including cocaine and opiates Patient has no withdrawal symptoms of the substance abuse and also denies any cravings at this time Will start Geodon 20 mg IM as needed for agitation and aggressive behaviors   Appreciate psychiatric consultation and follow up as clinically required Please contact [phone] or [phone] if needs further assistance  Disposition: Supportive therapy provided about ongoing stressors.  Ambrose Finland, MD 03/14/2017 12:21 PM

## 2017-03-15 DIAGNOSIS — F251 Schizoaffective disorder, depressive type: Secondary | ICD-10-CM

## 2017-03-15 DIAGNOSIS — F1423 Cocaine dependence with withdrawal: Secondary | ICD-10-CM

## 2017-03-15 LAB — CBC WITH DIFFERENTIAL/PLATELET
Basophils Absolute: 0 10*3/uL (ref 0.0–0.1)
Basophils Relative: 0 %
EOS PCT: 2 %
Eosinophils Absolute: 0.1 10*3/uL (ref 0.0–0.7)
HCT: 35 % — ABNORMAL LOW (ref 39.0–52.0)
Hemoglobin: 11.9 g/dL — ABNORMAL LOW (ref 13.0–17.0)
LYMPHS ABS: 2.6 10*3/uL (ref 0.7–4.0)
LYMPHS PCT: 29 %
MCH: 27.7 pg (ref 26.0–34.0)
MCHC: 34 g/dL (ref 30.0–36.0)
MCV: 81.6 fL (ref 78.0–100.0)
MONO ABS: 0.8 10*3/uL (ref 0.1–1.0)
Monocytes Relative: 9 %
Neutro Abs: 5.3 10*3/uL (ref 1.7–7.7)
Neutrophils Relative %: 60 %
PLATELETS: 190 10*3/uL (ref 150–400)
RBC: 4.29 MIL/uL (ref 4.22–5.81)
RDW: 15.1 % (ref 11.5–15.5)
WBC: 8.9 10*3/uL (ref 4.0–10.5)

## 2017-03-15 LAB — BASIC METABOLIC PANEL
Anion gap: 6 (ref 5–15)
BUN: 16 mg/dL (ref 6–20)
CO2: 22 mmol/L (ref 22–32)
Calcium: 8.6 mg/dL — ABNORMAL LOW (ref 8.9–10.3)
Chloride: 110 mmol/L (ref 101–111)
Creatinine, Ser: 1.72 mg/dL — ABNORMAL HIGH (ref 0.61–1.24)
GFR calc Af Amer: 48 mL/min — ABNORMAL LOW (ref >60)
GFR, EST NON AFRICAN AMERICAN: 42 mL/min — AB (ref >60)
GLUCOSE: 104 mg/dL — AB (ref 65–99)
POTASSIUM: 3.3 mmol/L — AB (ref 3.5–5.1)
Sodium: 138 mmol/L (ref 135–145)

## 2017-03-15 LAB — MAGNESIUM: Magnesium: 2 mg/dL (ref 1.7–2.4)

## 2017-03-15 LAB — PHOSPHORUS: Phosphorus: 4.1 mg/dL (ref 2.5–4.6)

## 2017-03-15 LAB — SODIUM: SODIUM: 138 mmol/L (ref 135–145)

## 2017-03-15 MED ORDER — CLONIDINE HCL 0.2 MG/24HR TD PTWK
0.2000 mg | MEDICATED_PATCH | TRANSDERMAL | Status: DC
  Administered 2017-03-15: 0.2 mg via TRANSDERMAL
  Filled 2017-03-15: qty 1

## 2017-03-15 NOTE — Progress Notes (Signed)
PULMONARY / CRITICAL CARE MEDICINE   Name: MELL GUIA MRN: [id number] DOB: Mar 30, 1956    ADMISSION DATE:  03/08/2017 CONSULTATION DATE:  4/27  REFERRING MD:  Erlinda Hong  CHIEF COMPLAINT:  Agitated delirium w/ concern for ETOH w/d   brief This is a 61 year old male who presented to the ER on 4/25 w/ cc: severe HA, dizziness, N&V. Dx eval demonstrated: large acute left PICA territory/cerebellar infarct w/ associated cerebral edema w/ some associated mid-line shift.  ->has h/o cocaine abuse (including the night prior)raising concern that cocaine induced vasospasm may have precipitated the event. Agitation and delirium have been worsening despite precedex initiation, and Ativan per CIWA protocol.  ES:  MR brain 4/24: 1. Truncated examination due to patient refusal to complete all sequences.2. 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 The basal cisterns and fourth ventricle remain patent without obstructive hydrocephalus. No acute hemorrhage. 3. Small left occipital acute infarct. 4. Chronic microvascular ischemia and scattered punctate foci of chronic microhemorrhage. CT head 4/26: 1. Continued moderate to severe left cerebellar edema with interval mildly increased mass effect on the fourth ventricle and early lateral ventriculomegaly - mild enlargement of the temporal horn since yesterday but no transependymal edema is evident. 2. No hemorrhagic transformation or new intracranial abnormality. 4/27: 1. 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 2. Left AICA is not clearly visualized, but the AICA origin and caliber are highly variable and it is often not identified on scans of normal patients.3. Unchanged left cerebellar cytotoxic edema with mass effect on the fourth ventricle and basal cisterns. Crowding of the foramen magnum is also unchanged.4. No  hemorrhage or hydrocephalus.  CULTURES: 4/25>> MRSA Negative  ANTIBIOTICS: None  Hospital course  4/25 admitted to NICU (did not get TPA d/t being outside window. Placed on CIWA protocol. Started on 3% saline. Allowing for permissive HTN (goal <220/120. Venous US obtained: + DVT RLE.  4/26 IVC filter placed. PCCM asked to place CVL for 3%. Left IJ CVL 4/26>>> 4/27 increased agitation. Hitting at staff. CT unchanged from earlier f/u CT day prior. PCCM asked to assess for precedex  5/1 remains on precedex  SUBJECTIVE/OVERNIGHT/INTERVAL HX Arousable and less confused, up to precedex 0.4 mg/hr drip.  VITAL SIGNS: BP (!) 143/85   Pulse 72   Temp 99 F (37.2 C) (Oral)   Resp 16   Ht 5\' 8"  (1.727 m)   Wt 85.9 kg (189 lb 6 oz)   SpO2 94%   BMI 28.79 kg/m   INTAKE / OUTPUT: I/O last 3 completed shifts: In: 1083.6 [P.O.:390; I.V.:683.6; Other:10] Out: 3575 [Urine:3575]  General Appearance:    Arousable, following some commands, combative and agitated.  Head:    Normocephalic, without obvious abnormality, atraumatic  Eyes:    PERRL - conjunctiva and corneas clear      Ears:    Normal external ear canals, both ears, without drainage  Nose:   WNL     Neck:   Supple,  No enlargement/tenderness/nodules     Lungs:     Clear throughout, slightly diminished per bases  Chest wall:    No obvious deformity noted  Heart:    S1 and S2 normal, no MRG  Abdomen:     Soft, BS+, non-tender, non-distended  Genitalia:    Not done  Rectal:   not done  Extremities:   Without deformities noted  Skin:   Warm and dry without rash or lesions noted. Dressing to right subclavian area      Neurologic:   MAE x 4 CAM-ICU - confused . Orientation to self only - agitated and restrained, selectively follows commands   PULMONARY No results for input(s): PHART, PCO2ART, PO2ART, HCO3, TCO2, O2SAT in the last 168 hours.  Invalid input(s): PCO2, PO2  CBC  Recent Labs Lab 03/13/17 0422  03/14/17 1220 03/15/17 0622  HGB 12.3* 13.2 11.9*  HCT 36.7* 38.7* 35.0*  WBC 7.6 8.7 8.9  PLT 192 195 190   COAGULATION  Recent Labs Lab 03/10/17 0803  INR 0.98   CARDIAC  No results for input(s): TROPONINI in the last 168 hours. No results for input(s): PROBNP in the last 168 hours.  CHEMISTRY  Recent Labs Lab 03/11/17 0855 03/11/17 1215  03/12/17 0500  03/13/17 0045 03/13/17 0422  03/14/17 0535 03/14/17 1105 03/14/17 1835 03/15/17 0025 03/15/17 0622  NA 140 140  < > 146*  < > 143 142  < > 141 138 138 138 138  K 7.2* >7.5*  --  3.1*  --  3.2*  --   --   --   --   --   --  3.3*  CL 117* 116*  --  121*  --  116*  --   --   --   --   --   --  110  CO2 18* 18*  --  19*  --  19*  --   --   --   --   --   --  22  GLUCOSE 112* 115*  --  105*  --  98  --   --   --   --   --   --  104*  BUN 11 10  --  12  --  7  --   --   --   --   --   --  16  CREATININE 1.36* 1.40*  --  1.44*  --  1.25*  --   --   --   --   --   --  1.72*  CALCIUM 8.5* 8.5*  --  8.5*  --  8.4*  --   --   --   --   --   --  8.6*  MG  --   --   --   --   --   --  1.7  --  2.1  --   --   --  2.0  PHOS  --   --   --   --   --   --  1.6*  --  2.9  --   --   --  4.1  < > = values in this interval not displayed. Estimated Creatinine Clearance: 49.3 mL/min (A) (by C-G formula based on SCr of 1.72 mg/dL (H)).  LIVER  Recent Labs Lab 03/08/17 2105 03/10/17 0803  AST 30  --   ALT 14*  --   ALKPHOS 72  --   BILITOT 0.7  --   PROT 8.0  --   ALBUMIN 4.1  --   INR  --  0.98   INFECTIOUS No results for input(s): LATICACIDVEN, PROCALCITON in the last 168 hours.  ENDOCRINE CBG (last 3)  No results for input(s): GLUCAP in the last 72 hours.  IMAGING x48h  - image(s) personally visualized  -   highlighted in bold I reviewed head CT myself, left  inferior and posterior cerebellar infarct  DISCUSSION: Left cerebellar stroke. Presume cocaine induced. Now w/ probably ETOH w/d. Worsening agitation and delirium  despite  precedex at 0.4 mcg. PRN versed, Refusing Seroquel.  BP goal <180/105, cont 3% protocol, f/u chemistries.   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 Source unclear ? Af vs cocaine related vasospasm Acute encephalopathy: presumed Delirium tremens/   h/o substance abuse   P:   RASS goal: 0 to -1 Precedex gtt, attempt to get off today Will order clonidine as a patch since patient is refusing PO Continue Seroquel if will take 12 Lead EKG now Thiamine and folate Aspirin for stroke prevention  3% saline infusion protocol complete Serial imaging per neuro and neuro-surg services re: watching for obstructive hydrocephalus   Pulmonary  A: Elevated right HD Room Air SATs 94%  03/15/2017  - protecting airway P: Titrate oxygen to Maintain saturations > 93% Pulmonary Toilet Mobilize as able  CARDIOVASCULAR A:  HTN Tachycardia  Hyperlipidemia  P:  Allowing for permissive HTN <180/105 Tele monitoring  Maintain MAP > 65 Cont lipitor  Clonidine patch 0.2 with holding parameters for SBP<150  RENAL A:   CKD stage IV  Hyperchloremia  NAGMA Hypokalemia  03/15/2017 - No Acute Issues P:   Trend BMET daily Replete electrolytes as needed Strict I&O Hypernatremia protocol complete Avoid nephrotoxic medications Maintain renal perfussion  GASTROINTESTINAL A:   Aspiration risk P:   Aspiration precautions  SUP  HEMATOLOGIC A:   RLE DVT P:  IVC filter Trend CBC Eventually decide of systemic a/c  FAMILY  - No Family at bedside  - Inter-disciplinary family meet or Palliative Care meeting due by:  5/2  Discussed with neurology and TRH-MD, attempt to get off precedex drip today, transfer to SDU, PCCM will sign off, please call back if needed.  Rush Farmer, M.D. [facility] Pulmonary/Critical Care Medicine. Pager: [phone]. After hours pager: [phone].  03/15/2017 11:32 AM

## 2017-03-15 NOTE — Progress Notes (Signed)
OT Cancellation Note  Patient Details Name: Darryl Parsons MRN: [id number] DOB: 06/11/1956   Cancelled Treatment:    Reason Eval/Treat Not Completed: Medical issues which prohibited therapy.  Pt agitated, combative, and in restraints.  Will reattempt.  [name], OTR/L [phone]   Lucille Passy M 03/15/2017, 3:17 PM

## 2017-03-15 NOTE — Progress Notes (Signed)
SLP Cancellation Note  Patient Details Name: Darryl Parsons MRN: [id number] DOB: September 04, 1956   Cancelled treatment:       Reason Eval/Treat Not Completed: Other (comment) Pt has been agitated and combative today. Will f/u as able for cognitive treatment.   Germain Osgood 03/15/2017, 4:22 PM  Germain Osgood, M.A. CCC-SLP [phone]

## 2017-03-15 NOTE — Progress Notes (Signed)
Patient combative Alert and oriented Moving all extremities well Incredibly unlikely to require surgery at this point Will follow from [facility] with questions

## 2017-03-15 NOTE — Care Management Note (Signed)
Case Management Note  Patient Details  Name: Darryl Parsons MRN: [id number] Date of Birth: 1956/09/04  Subjective/Objective:   Pt is a 61 y.o. male admitted to ED on 03/08/17 with severe headache, dizziness, nausea and vomiting. MRI on 4/24 showed large acute infarct of L PICA and small L occipital acute infarct.  PTA, pt independent, lives with significant other in a hotel.                    Action/Plan: IVC filter placed 03/10/17.  PT/OT recommending CIR upon medical stability; will follow progress.    Expected Discharge Date:                  Expected Discharge Plan:  Skilled Nursing Facility  In-House Referral:  Clinical Social Work  Discharge planning Services  CM Consult  Post Acute Care Choice:    Choice offered to:     DME Arranged:    DME Agency:     HH Arranged:    [place] Agency:     Status of Service:  In process, will continue to follow  If discussed at Long Length of Stay Meetings, dates discussed:    Additional Comments:  03/15/17 J. Chavonne Sforza, RN, BSN Per [employer] liaison, pt may likely need SNF placement as he lives in a motel, and has questionable support.  Will consult CSW to facilitate discharge to SNF should CIR not be an option.    Reinaldo Raddle, RN, BSN  Trauma/Neuro ICU Case Manager [phone]

## 2017-03-15 NOTE — Progress Notes (Signed)
STROKE TEAM PROGRESS NOTE   SUBJECTIVE (INTERVAL HISTORY) Patient remains on Precedex and in restraints.  but still needs sitter. Discussed with CCM  And [facility] medical hospitalist Dr Tama Gander. He continues to be verbally aggressive to staff and wants to be unrestained       OBJECTIVE Temp:  [97.9 F (36.6 C)-99.4 F (37.4 C)] 99.4 F (37.4 C) (05/01 1216) Pulse Rate:  [61-94] 77 (05/01 1200) Cardiac Rhythm: Normal sinus rhythm (05/01 0800) Resp:  [12-22] 15 (05/01 1200) BP: (114-166)/(75-112) 162/95 (05/01 1216) SpO2:  [94 %-98 %] 98 % (05/01 1200)  CBC:   Recent Labs Lab 03/14/17 1220 03/15/17 0622  WBC 8.7 8.9  NEUTROABS 6.1 5.3  HGB 13.2 11.9*  HCT 38.7* 35.0*  MCV 82.5 81.6  PLT 195 932    Basic Metabolic Panel:  Recent Labs Lab 03/13/17 0045  03/14/17 0535  03/15/17 0025 03/15/17 0622  NA 143  < > 141  < > 138 138  K 3.2*  --   --   --   --  3.3*  CL 116*  --   --   --   --  110  CO2 19*  --   --   --   --  22  GLUCOSE 98  --   --   --   --  104*  BUN 7  --   --   --   --  16  CREATININE 1.25*  --   --   --   --  1.72*  CALCIUM 8.4*  --   --   --   --  8.6*  MG  --   < > 2.1  --   --  2.0  PHOS  --   < > 2.9  --   --  4.1  < > = values in this interval not displayed.  Lipid Panel:     Component Value Date/Time   CHOL 226 (H) 03/09/2017 0408   TRIG 85 03/09/2017 0408   HDL 74 03/09/2017 0408   CHOLHDL 3.1 03/09/2017 0408   VLDL 17 03/09/2017 0408   LDLCALC 135 (H) 03/09/2017 0408   HgbA1c:  Lab Results  Component Value Date   HGBA1C <4.2 (L) 03/09/2017   Urine Drug Screen:     Component Value Date/Time   LABOPIA POSITIVE (A) 03/09/2017 0930   COCAINSCRNUR POSITIVE (A) 03/09/2017 0930   COCAINSCRNUR (A) 12/09/2007 0230    POSITIVE (NOTE) Result repeated and verified. Sent for confirmatory testing   LABBENZ POSITIVE (A) 03/09/2017 0930   LABBENZ NEGATIVE 12/09/2007 0230   AMPHETMU NONE DETECTED 03/09/2017 0930   THCU NONE DETECTED  03/09/2017 0930   LABBARB NONE DETECTED 03/09/2017 0930    Alcohol Level     Component Value Date/Time   ETH <5 10/06/2016 3557    IMAGING I have personally reviewed the radiological images below and agree with the radiology interpretations.  [name] 03/08/2017 1. Truncated examination due to patient refusal to complete all sequences.  2. 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 The basal cisterns and fourth ventricle remain patent without obstructive hydrocephalus. No acute hemorrhage.  3. Small left occipital acute infarct.  4. Chronic microvascular ischemia and scattered punctate foci of chronic microhemorrhage.   Ct Head Wo Contrast 03/10/2017 1. Continued moderate to severe left cerebellar edema with interval mildly increased mass effect on the fourth ventricle and  early lateral ventriculomegaly - mild enlargement of the temporal horn since yesterday but no transependymal edema is evident.  2. No hemorrhagic transformation or new intracranial abnormality.    LE venous doppler - Positive for acute deep vein thrombosis involving the right peroneal veins and a short segment of the right posterior tibial veins at the proximal and mid level, and thrombus in the proximal muscular calf gastro veins.  TTE - pt not cooperative due to delirium  CTA Head and Neck  03/11/2017 1. 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 2. Left AICA is not clearly visualized, but the AICA origin and caliber are highly variable and it is often not identified on scans of normal patients. 3. Unchanged left cerebellar cytotoxic edema with mass effect on the fourth ventricle and basal cisterns. Crowding of the foramen magnum is also unchanged. 4. No hemorrhage or hydrocephalus.   PHYSICAL EXAM  Temp:  [97.9 F (36.6 C)-99.4 F (37.4 C)] 99.4 F  (37.4 C) (05/01 1216) Pulse Rate:  [61-94] 77 (05/01 1200) Resp:  [12-22] 15 (05/01 1200) BP: (114-166)/(75-112) 162/95 (05/01 1216) SpO2:  [94 %-98 %] 98 % (05/01 1200)  General - Well nourished, well developed, delirium with agitation and confusion.  Ophthalmologic - Fundi not visualized due to noncooperation.  Cardiovascular - Regular rate and rhythm.  Neuro -  Drowsy but arouses easily and will follow commands. Diminished attention, registration and recall.[name]  PERRL, facial symmetrical, tongue midline. Moving BUE and BLE equally,  DTR 1+. Sensation symmetrical, gait not tested.    ASSESSMENT/PLAN [name] is a 61 y.o. male with history of HTN, CKD, cocaine use and mental disorder presenting with severe HA, nausea, vomiting, dizziness. He did not receive IV t-PA due to delay in arrival.   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 Thrombotic due to cocaine use  Resultant  Nystagmus and left eye abduction deficit, left UE mild dysmetria  NS consulted (Pool). ICU monitoring with f/u imaging for hydrocephalus eval  MRI  Truncated d/t pt refusal. Large L PICA infarct w/ cytotoxic edea and Rightward shift. No hydrocephalus. No hmg. Small L occipital infarct.   Repeat CT head mild hydrocephalus  LE venous Doppler  Right LE DVT  2D Echo  Pt not cooperative  CTA head and neck - Non-opacification of the left PICA, in keeping with acute left PICA territory infarct.  Consider outpt 30 day cardiac event monitoring to rule  out afib as outpt  UDS - positive cocaine, opiates and benzo  LDL 135  HgbA1c < 4.2  Lovenox 40 mg sq daily for VTE prophylaxis  Diet Heart Room service appropriate? Yes; Fluid consistency: Thin.   No antithrombotic prior to admission, now on aspirin 325 mg daily.   Patient counseled to be compliant with his antithrombotic medications  Ongoing aggressive stroke risk factor management  Therapy recommendations:  CIR   Disposition:  Pending  Cerebellar edema / hydrocephalus  NSG on board - no intervention needed at this time  Repeat CT no significant  hydrocephalus  Close neuro check  On 3% saline, increase to 75cc/h  Na 139 -> 143 -> 140  Had 23.4% once last night  Right LE DVT  Not candidate for anticoagulation  IVC filter placed  ? Alcohol withdraw  Pt admitted drinking alcohol  Not sure the amount and time of last drink  On CIWA protocol  B1/FA/MVI  Hypertension  BP 160-170s on arrival  On amlodipine, clonidine, HCTZ and metoprolol at home  Hydralazine PRN for BP control  Permissive hypertension (OK if < 180/105) but gradually normalize in 5-7 days  Long-term BP goal normotensive  Hyperlipidemia  Home meds:  No statin  LDL 135, goal < 70  Add lipitor 40mg   Cocaine abuse  UDS positive cocaine  Cessation counseling will be provided  Tobacco abuse  Current smoker  Smoking cessation counseling provided  Pt is willing to quit  Other Stroke Risk Factors     Other Active Problems  CKD Cre 1.52->1.47 -> 1.36  Agitation and depression - On zoloft  Psychosis - on seroquel   Mild leukocytosis - 12.8   Hyperkalemia - 7.2 (hemolysis) - repeat Bmet pending  Hospital day # 6 Discussed with Dr. Nelda Marseille and Thereasa Solo.Transfer to step down when off Precedex. Medical hospitalist team to assume care tomorrow. Continue present care.Psych consult appreciated. Continue haldol, seroquel and trying to wean Precedex. Appreciate pulmonary  critical care and neurosurgery help This patient is critically ill due to large cerebellar infarct, cocaine use, delirium, agitation, alcohol withdraw and at significant risk of neurological worsening, death form recurrent stroke, hydrocephalus, cerebral edema, heart failure, MI, and DT. This patient's care requires constant monitoring of vital signs, hemodynamics, respiratory and cardiac monitoring, review of multiple databases, neurological assessment, discussion with family, other specialists and medical decision making of high complexity. I spent 30 minutes of neurocritical care time in the care of this patient.  Antony Contras, MD Medical Director [facility] Pager: [phone] 03/15/2017 1:43 PM   To contact Stroke Continuity provider, please refer to [email or web address] After hours, contact General Neurology

## 2017-03-16 LAB — CBC WITH DIFFERENTIAL/PLATELET
BASOS ABS: 0 10*3/uL (ref 0.0–0.1)
Basophils Relative: 0 %
Eosinophils Absolute: 0.2 10*3/uL (ref 0.0–0.7)
Eosinophils Relative: 1 %
HEMATOCRIT: 37.5 % — AB (ref 39.0–52.0)
HEMOGLOBIN: 12.5 g/dL — AB (ref 13.0–17.0)
LYMPHS PCT: 24 %
Lymphs Abs: 2.8 10*3/uL (ref 0.7–4.0)
MCH: 27.2 pg (ref 26.0–34.0)
MCHC: 33.3 g/dL (ref 30.0–36.0)
MCV: 81.7 fL (ref 78.0–100.0)
MONO ABS: 1.2 10*3/uL — AB (ref 0.1–1.0)
Monocytes Relative: 11 %
NEUTROS ABS: 7.3 10*3/uL (ref 1.7–7.7)
NEUTROS PCT: 64 %
Platelets: 221 10*3/uL (ref 150–400)
RBC: 4.59 MIL/uL (ref 4.22–5.81)
RDW: 14.7 % (ref 11.5–15.5)
WBC: 11.5 10*3/uL — ABNORMAL HIGH (ref 4.0–10.5)

## 2017-03-16 LAB — TROPONIN I: Troponin I: 0.04 ng/mL (ref <0.03)

## 2017-03-16 LAB — MAGNESIUM: Magnesium: 2 mg/dL (ref 1.7–2.4)

## 2017-03-16 LAB — CREATININE, SERUM
CREATININE: 1.72 mg/dL — AB (ref 0.61–1.24)
GFR calc Af Amer: 48 mL/min — ABNORMAL LOW (ref >60)
GFR calc non Af Amer: 42 mL/min — ABNORMAL LOW (ref >60)

## 2017-03-16 LAB — PHOSPHORUS: PHOSPHORUS: 3.8 mg/dL (ref 2.5–4.6)

## 2017-03-16 MED ORDER — GI COCKTAIL ~~LOC~~: 30.0000 mL | Freq: Three times a day (TID) | ORAL | Status: DC | PRN

## 2017-03-16 MED ORDER — MORPHINE SULFATE (PF) 4 MG/ML IV SOLN
1.0000 mg | Freq: Once | INTRAVENOUS | Status: AC
  Administered 2017-03-16: 1 mg via INTRAVENOUS
  Filled 2017-03-16: qty 1

## 2017-03-16 MED ORDER — PANTOPRAZOLE SODIUM 40 MG PO TBEC
40.0000 mg | DELAYED_RELEASE_TABLET | Freq: Every day | ORAL | Status: DC
  Administered 2017-03-17 – 2017-03-20 (×2): 40 mg via ORAL
  Filled 2017-03-16 (×3): qty 1

## 2017-03-16 MED ORDER — FOLIC ACID 1 MG PO TABS
1.0000 mg | ORAL_TABLET | Freq: Every day | ORAL | Status: DC
  Administered 2017-03-19 – 2017-03-20 (×2): 1 mg via ORAL
  Filled 2017-03-16 (×3): qty 1

## 2017-03-16 MED ORDER — ACETAMINOPHEN 500 MG PO TABS
1000.0000 mg | ORAL_TABLET | Freq: Three times a day (TID) | ORAL | Status: DC | PRN
  Administered 2017-03-16 – 2017-03-17 (×3): 1000 mg via ORAL
  Filled 2017-03-16 (×4): qty 2

## 2017-03-16 NOTE — Progress Notes (Signed)
Physical Therapy Treatment Patient Details Name: Darryl Parsons MRN: [id number] DOB: 04-06-1956 Today's Date: 03/16/2017    History of Present Illness Pt is a 61 y.o. male admitted to ED on 03/08/17 with severe headache, dizziness, nausea and vomiting. MRI on 4/24 showed large acute infarct of L PICA and small L occipital acute infarct. Pt s/p IVC filter placement 4/26. Pertinent PMH includes mental disorder, HTN, CKD, LBP. [name] Became very combative and was verbally threatening staff morning of 4/29.    PT Comments    PAtient able to ambulate in hallway this session, but remains severely unsteady and in need of at times two person assist.  Did not elect to use AD this session, but willing to try next time.  Agree with CIR level rehab as pt with much improved behavior, cooperation and insight.  Agree he can likely achieve mod I level at d/c.    Follow Up Recommendations  CIR;Supervision/Assistance - 24 hour     Equipment Recommendations  Rolling [place] with 5" wheels    Recommendations for Other Services       Precautions / Restrictions Precautions Precautions: Fall Precaution Comments: blurry vision    Mobility  Bed Mobility Overal bed mobility: Needs Assistance Bed Mobility: Supine to Sit     Supine to sit: Min assist Sit to supine: Min assist   General bed mobility comments: assist for trunk; assist for legs  Transfers Overall transfer level: Needs assistance Equipment used: 1 person hand held assist Transfers: Sit to/from Stand Sit to Stand: Mod assist         General transfer comment: lifting help from EOB  Ambulation/Gait Ambulation/Gait assistance: Mod assist Ambulation Distance (Feet): 90 Feet Assistive device: 1 person hand held assist;2 person hand held assist Gait Pattern/deviations: Step-to pattern;Step-through pattern;Narrow base of support;Decreased stride length;Shuffle     General Gait Details: unsteady loosing balance to L and RN in hallway giving  second HHA at times, pt slow and stopping to comment on visual issues throughout   Stairs            Wheelchair Mobility    Modified Rankin (Stroke Patients Only) Modified Rankin (Stroke Patients Only) Pre-Morbid Rankin Score: No symptoms Modified Rankin: Moderately severe disability     Balance Overall balance assessment: Needs assistance Sitting-balance support: No upper extremity supported Sitting balance-Leahy Scale: Good     Standing balance support: Single extremity supported;Bilateral upper extremity supported Standing balance-Leahy Scale: Poor Standing balance comment: assist due to leaning L                            Cognition Arousal/Alertness: Awake/alert Behavior During Therapy: WFL for tasks assessed/performed Overall Cognitive Status: Within Functional Limits for tasks assessed                                 General Comments: calm and cooperative this session; relates issues with safety if to be d/c due to balance and blurry vision; agreeable to rehab      Exercises      General Comments General comments (skin integrity, edema, etc.): reports blurry vision, had glasses on initially, then removed to try to improve clarity; possibly dysconjugate gaze with L eye more lateral at times and noted some L beat nystagmus when eyes to L      Pertinent Vitals/Pain Pain Assessment: Faces Faces Pain Scale: Hurts even more Pain  Location: generalized  Pain Descriptors / Indicators: Aching;Discomfort Pain Intervention(s): Monitored during session;Repositioned    Home Living                      Prior Function            PT Goals (current goals can now be found in the care plan section) Progress towards PT goals: Progressing toward goals    Frequency    Min 4X/week      PT Plan Current plan remains appropriate    Co-evaluation              AM-PAC PT "6 Clicks" Daily Activity  Outcome Measure   Difficulty turning over in bed (including adjusting bedclothes, sheets and blankets)?: A Little Difficulty moving from lying on back to sitting on the side of the bed? : Total Difficulty sitting down on and standing up from a chair with arms (e.g., wheelchair, bedside commode, etc,.)?: Total Help needed moving to and from a bed to chair (including a wheelchair)?: A Lot Help needed walking in hospital room?: A Lot Help needed climbing 3-5 steps with a railing? : A Lot 6 Click Score: 11    End of Session Equipment Utilized During Treatment: Gait belt Activity Tolerance: Patient limited by fatigue Patient left: in bed;with call bell/phone within reach;with bed alarm set   PT Visit Diagnosis: Difficulty in walking, not elsewhere classified (R26.2)     Time: 7262-0355 PT Time Calculation (min) (ACUTE ONLY): 28 min  Charges:  $Gait Training: 23-37 mins                    G CodesMagda Kiel, [place] 03/16/2017    Reginia Naas 03/16/2017, 4:06 PM

## 2017-03-16 NOTE — Clinical Social Work Note (Signed)
Clinical Social Work Assessment  Patient Details  Name: Darryl Parsons MRN: [id number] Date of Birth: 1955/12/24  Date of referral:  03/16/17               Reason for consult:  Facility Placement, Discharge Planning                Permission sought to share information with:  Facility Sport and exercise psychologist, Family Supports Permission granted to share information::  Yes, Verbal Permission Granted  Name::        Agency::  SNF's  Relationship::     Contact Information:     Housing/Transportation Living arrangements for the past 2 months:  Hotel/Motel Source of Information:  Patient, Medical Team Patient Interpreter Needed:  None Criminal Activity/Legal Involvement Pertinent to Current Situation/Hospitalization:  No - Comment as needed Significant Relationships:  Spouse Lives with:  Spouse Do you feel safe going back to the place where you live?  Yes Need for family participation in patient care:  Yes (Comment)  Care giving concerns:  PT recommending CIR once medically stable for discharge. CSW initiating SNF backup plan.   Social Worker assessment / plan:  CSW met with patient. No supports at bedside. CSW introduced role and explained that PT recommendations would be discussed. Patient agreeable to SNF if he cannot go to CIR. CSW explained Medicaid barriers. The SNF will likely have to absorb therapy costs so that may limit SNF options, patient's Medicaid check will go directly to the facility minus $30, and patient will have to stay at the facility for a minimum of 30 days for Medicaid to pay anything. Patient expressed understanding of barriers and is still agreeable to SNF. Patient out of restraints but still has telesitter. This will have to be dc'ed for 24 hours prior to discharge to SNF. PASARR pending and patient also cannot go to SNF without this. No further concerns. CSW encouraged patient to contact CSW as needed. CSW will continue to follow patient for support and facilitate  discharge to SNF, if needed, once medically stable.  Employment status:  Unemployed Forensic scientist:  Medicaid [place] PT Recommendations:  Inpatient Rehab Consult Information / Referral to community resources:  [place]  Patient/Family's Response to care:  Patient agreeable to SNF placement. Patient has a wife but CIR has had difficulty getting in touch with her to discuss home support. Patient appreciated social work intervention.  Patient/Family's Understanding of and Emotional Response to Diagnosis, Current Treatment, and Prognosis:  Patient has a good understanding of the reason for admission and his need for rehab prior to returning home. Patient stated, "I know I need to get rehab before I go home." Patient appears pleased with hospital care today.  Emotional Assessment Appearance:  Appears stated age Attitude/Demeanor/Rapport:  Other (Pleasant) Affect (typically observed):  Accepting, Appropriate, Calm, Pleasant Orientation:  Oriented to Self, Oriented to Place, Oriented to  Time, Oriented to Situation Alcohol / Substance use:  Tobacco Use, Illicit Drugs Psych involvement (Current and /or in the community):  No (Comment)  Discharge Needs  Concerns to be addressed:  Care Coordination Readmission within the last 30 days:  No Current discharge risk:  Dependent with Mobility Barriers to Discharge:  [place] ([place]), Inadequate or no insurance, Continued Medical Work up, Requiring sitter/restraints   Candie Chroman, LCSW 03/16/2017, 3:37 PM

## 2017-03-16 NOTE — Clinical Social Work Note (Signed)
H&P, FL2, and psych note faxed to [facility] for review for PASARR.   Dayton Scrape, [place]

## 2017-03-16 NOTE — NC FL2 (Signed)
[place] LEVEL OF CARE SCREENING TOOL     IDENTIFICATION  Patient Name: Darryl Parsons Birthdate: 10-Jul-1956 Sex: male Admission Date (Current Location): 03/08/2017  [facility] and [place] Number:  Herbalist and Address:  [facility] [place] [address], [place], [place] [address]      Provider Number: [id number]  Attending Physician Name and Address:  Jonetta Osgood, MD  Relative Name and Phone Number:       Current Level of Care: Hospital Recommended Level of Care: [place] Prior Approval Number:    Date Approved/Denied:   PASRR Number: Manual review  Discharge Plan: SNF    Current Diagnoses: Patient Active Problem List   Diagnosis Date Noted  . Vertigo   . DVT (deep vein thrombosis) in pregnancy ([place])   . Central venous catheter in place   . 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 Stroke (cerebrum) ([place]) 03/09/2017  . Hypovitaminosis   . Tobacco use disorder   . Overdose 10/06/2016  . Schizoaffective disorder, depressive type ([place]) 08/27/2016  . Cocaine use disorder, mild, abuse 08/27/2016  . Essential hypertension 08/27/2016  . Chronic headache 08/27/2016  . Suicidal ideation 02/03/2016  . Encephalopathy acute 01/30/2016  . CKD (chronic kidney disease) stage 4, GFR 15-29 ml/min (HCC) 01/30/2016  . Benign essential HTN 01/30/2016  . Cocaine abuse 08/21/2012  . Hypokalemia 08/21/2012    Orientation RESPIRATION BLADDER Height & Weight     Self, Time, Situation, Place  O2 (Nasal Canula 2 L) Continent, External catheter Weight: 189 lb 6 oz (85.9 kg) Height:  5\' 8"  (172.7 cm)  BEHAVIORAL SYMPTOMS/MOOD NEUROLOGICAL BOWEL NUTRITION STATUS  Other (Comment) (Currently calm and cooperative. Patient was agitated, combative, threatening until yesterday which MD attributes to withdrawal. Patient is out of restraints  today.)  (Stroke) Continent Diet (Heart healthy)  AMBULATORY STATUS COMMUNICATION OF NEEDS Skin   Extensive Assist Verbally Surgical wounds                       Personal Care Assistance Level of Assistance  Bathing, Feeding, Dressing Bathing Assistance: Limited assistance Feeding assistance: Independent Dressing Assistance: Limited assistance     Functional Limitations Info  Sight, Hearing, Speech Sight Info: Adequate Hearing Info: Adequate Speech Info: Adequate    SPECIAL CARE FACTORS FREQUENCY  OT (By licensed OT), Speech therapy     PT Frequency: 5 x week OT Frequency: 5 x week     Speech Therapy Frequency: 2 x week      Contractures Contractures Info: Not present    Additional Factors Info  Code Status, Allergies, Psychotropic Code Status Info: Full Allergies Info: Other: Pt states he is allergic to a blood pressure pill. He is unable to state reaction. He is spelling it adelatt and aderlatt. Psychotropic Info: Schizoaffective Disorder, Depressive Type: Haldol injection 5 mg IV every 6 hours, Seroquel 300 mg PO QHS, Zoloft 50 mg PO daily, Hydroxyzine 25 mg every 6 hours prn, Geodon injection every 2 hours prn.         Current Medications (03/16/2017):  This is the current hospital active medication list Current Facility-Administered Medications  Medication Dose Route Frequency Provider Last Rate Last Dose  . acetaminophen (TYLENOL) tablet 1,000 mg  1,000 mg Oral Q8H PRN Jonetta Osgood, MD   1,000 mg at 03/16/17 1030  . amLODipine (NORVASC) tablet 10  mg  10 mg Oral Daily David L Rinehuls, PA-C   10 mg at 03/16/17 0820  . aspirin EC tablet 325 mg  325 mg Oral Daily Rosalin Hawking, MD   325 mg at 03/16/17 9892  . atorvastatin (LIPITOR) tablet 40 mg  40 mg Oral q1800 Rosalin Hawking, MD   40 mg at 03/15/17 1807  . butalbital-acetaminophen-caffeine (FIORICET, ESGIC) 50-325-40 MG per tablet 2 tablet  2 tablet Oral Q8H PRN Rosalin Hawking, MD   2 tablet at 03/16/17 0813  .  Chlorhexidine Gluconate Cloth 2 % PADS 6 each  6 each Topical Daily Rosalin Hawking, MD   6 each at 03/16/17 1031  . cloNIDine (CATAPRES - Dosed in mg/24 hr) patch 0.2 mg  0.2 mg Transdermal Weekly Rush Farmer, MD   0.2 mg at 03/15/17 1355  . enoxaparin (LOVENOX) injection 40 mg  40 mg Subcutaneous Q24H Rosalin Hawking, MD   40 mg at 03/16/17 1329  . [START ON 11/16/9415] folic acid (FOLVITE) tablet 1 mg  1 mg Oral Daily Donalynn Furlong [place], [facility]      . haloperidol lactate (HALDOL) injection 5 mg  5 mg Intravenous Q6H Brand Males, MD   5 mg at 03/16/17 1329  . hydrALAZINE (APRESOLINE) injection 10-20 mg  10-20 mg Intravenous Q2H PRN Rosalin Hawking, MD   20 mg at 03/13/17 1900  . hydrOXYzine (ATARAX/VISTARIL) tablet 25 mg  25 mg Oral Q6H PRN Kerney Elbe, MD   25 mg at 03/13/17 0333  . midazolam (VERSED) injection 2 mg  2 mg Intravenous Q2H PRN Raylene Miyamoto, MD   2 mg at 03/13/17 2115  . multivitamin with minerals tablet 1 tablet  1 tablet Oral Daily Rosalin Hawking, MD   1 tablet at 03/16/17 4081  . ondansetron (ZOFRAN) injection 4 mg  4 mg Intravenous Q6H PRN Rosalin Hawking, MD   4 mg at 03/09/17 1245  . QUEtiapine (SEROQUEL) tablet 300 mg  300 mg Oral QHS Rosalin Hawking, MD   300 mg at 03/15/17 2152  . senna-docusate (Senokot-S) tablet 1 tablet  1 tablet Oral QHS PRN Kerney Elbe, MD      . sertraline (ZOLOFT) tablet 50 mg  50 mg Oral Daily Kerney Elbe, MD   50 mg at 03/16/17 0820  . thiamine (VITAMIN B-1) tablet 100 mg  100 mg Oral Daily Rosalin Hawking, MD   100 mg at 03/16/17 4481   Or  . thiamine (B-1) injection 100 mg  100 mg Intravenous Daily Rosalin Hawking, MD   100 mg at 03/13/17 1041  . ziprasidone (GEODON) injection 10 mg  10 mg Intramuscular Q2H PRN Ambrose Finland, MD         Discharge Medications: Please see discharge summary for a list of discharge medications.  Relevant Imaging Results:  Relevant Lab Results:   Additional Information SS#: [id number]. Patient lives in a hotel with his  wife. Out of restraints but still has telesitter.  Candie Chroman, LCSW

## 2017-03-16 NOTE — Progress Notes (Signed)
STROKE TEAM PROGRESS NOTE   SUBJECTIVE (INTERVAL HISTORY) Patient transferred to floor bed y`day and is doing well. Complaints of headache.not cooperative with physical therapy.Echo was unremarkable OBJECTIVE Temp:  [98.1 F (36.7 C)-100 F (37.8 C)] 98.4 F (36.9 C) (05/02 1146) Pulse Rate:  [82-103] 88 (05/02 1216) Cardiac Rhythm: Normal sinus rhythm (05/02 0900) Resp:  [12-28] 12 (05/02 1216) BP: (146-176)/(87-118) 149/87 (05/02 1146) SpO2:  [67 %-100 %] 100 % (05/02 1216)  CBC:   Recent Labs Lab 03/15/17 0622 03/16/17 0214  WBC 8.9 11.5*  NEUTROABS 5.3 7.3  HGB 11.9* 12.5*  HCT 35.0* 37.5*  MCV 81.6 81.7  PLT 190 144    Basic Metabolic Panel:  Recent Labs Lab 03/13/17 0045  03/15/17 0025 03/15/17 0622 03/16/17 0214  NA 143  < > 138 138  --   K 3.2*  --   --  3.3*  --   CL 116*  --   --  110  --   CO2 19*  --   --  22  --   GLUCOSE 98  --   --  104*  --   BUN 7  --   --  16  --   CREATININE 1.25*  --   --  1.72* 1.72*  CALCIUM 8.4*  --   --  8.6*  --   MG  --   < >  --  2.0 2.0  PHOS  --   < >  --  4.1 3.8  < > = values in this interval not displayed.  Lipid Panel:     Component Value Date/Time   CHOL 226 (H) 03/09/2017 0408   TRIG 85 03/09/2017 0408   HDL 74 03/09/2017 0408   CHOLHDL 3.1 03/09/2017 0408   VLDL 17 03/09/2017 0408   LDLCALC 135 (H) 03/09/2017 0408   HgbA1c:  Lab Results  Component Value Date   HGBA1C <4.2 (L) 03/09/2017   Urine Drug Screen:     Component Value Date/Time   LABOPIA POSITIVE (A) 03/09/2017 0930   COCAINSCRNUR POSITIVE (A) 03/09/2017 0930   COCAINSCRNUR (A) 12/09/2007 0230    POSITIVE (NOTE) Result repeated and verified. Sent for confirmatory testing   LABBENZ POSITIVE (A) 03/09/2017 0930   LABBENZ NEGATIVE 12/09/2007 0230   AMPHETMU NONE DETECTED 03/09/2017 0930   THCU NONE DETECTED 03/09/2017 0930   LABBARB NONE DETECTED 03/09/2017 0930    Alcohol Level     Component Value Date/Time   ETH <5 10/06/2016  3154    IMAGING I have personally reviewed the radiological images below and agree with the radiology interpretations.  [name] 03/08/2017 1. Truncated examination due to patient refusal to complete all sequences.  2. 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 The basal cisterns and fourth ventricle remain patent without obstructive hydrocephalus. No acute hemorrhage.  3. Small left occipital acute infarct.  4. Chronic microvascular ischemia and scattered punctate foci of chronic microhemorrhage.   Ct Head Wo Contrast 03/10/2017 1. Continued moderate to severe left cerebellar edema with interval mildly increased mass effect on the fourth ventricle and early lateral ventriculomegaly - mild enlargement of the temporal horn since yesterday but no transependymal edema is evident.  2. No hemorrhagic transformation or new intracranial abnormality.    LE venous doppler - Positive for acute deep vein thrombosis involving the right peroneal veins and a short segment of the right posterior tibial  veins at the proximal and mid level, and thrombus in the proximal muscular calf gastro veins.  TTE - pt not cooperative due to delirium  CTA Head and Neck  03/11/2017 1. 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 2. Left AICA is not clearly visualized, but the AICA origin and caliber are highly variable and it is often not identified on scans of normal patients. 3. Unchanged left cerebellar cytotoxic edema with mass effect on the fourth ventricle and basal cisterns. Crowding of the foramen magnum is also unchanged. 4. No hemorrhage or hydrocephalus.  2DEcho 03/12/2017 : Left ventricle: The cavity size was normal. There was mild   concentric hypertrophy. Systolic function was normal. The   estimated ejection fraction was in the range of 60% to 65%.  Wall   motion was normal; there were no regional wall motion   abnormalities PHYSICAL EXAM  Temp:  [98.1 F (36.7 C)-100 F (37.8 C)] 98.4 F (36.9 C) (05/02 1146) Pulse Rate:  [82-103] 88 (05/02 1216) Resp:  [12-28] 12 (05/02 1216) BP: (146-176)/(87-118) 149/87 (05/02 1146) SpO2:  [67 %-100 %] 100 % (05/02 1216)  General - Well nourished, well developed, delirium with agitation and confusion.  Ophthalmologic - Fundi not visualized due to noncooperation.  Cardiovascular - Regular rate and rhythm.  Neuro -  Drowsy but arouses easily and will follow commands. Diminished attention, registration and recall.[name]  PERRL, facial symmetrical, tongue midline. Moving BUE and BLE equally,  DTR 1+. Sensation symmetrical, gait not tested.    ASSESSMENT/PLAN [name] is a 61 y.o. male with history of HTN, CKD, cocaine use and mental disorder presenting with severe HA, nausea, vomiting, dizziness. He did not receive IV t-PA due to delay in arrival.   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 Thrombotic due to cocaine use  Resultant  Nystagmus and left eye abduction deficit, left UE mild dysmetria  NS consulted (Pool). ICU monitoring with f/u imaging for hydrocephalus eval  MRI  Truncated d/t pt refusal. Large L PICA infarct w/ cytotoxic edea and Rightward shift. No hydrocephalus. No hmg. Small L occipital infarct.   Repeat CT head mild hydrocephalus  LE venous Doppler  Right LE DVT  2D Echo  normal  CTA head and neck -  Non-opacification of the left PICA, in keeping with acute left PICA territory infarct.  Consider outpt 30 day cardiac event monitoring to rule out afib as outpt but patient likely to be noncomplaint due to his behaviour so far  UDS - positive cocaine, opiates and benzo  LDL 135  HgbA1c < 4.2  Lovenox 40 mg sq daily for VTE prophylaxis  Diet Heart Room service appropriate? Yes; Fluid consistency: Thin.   No antithrombotic prior to admission, now on aspirin 325 mg daily.   Patient counseled to be compliant with his antithrombotic medications  Ongoing aggressive stroke risk factor management  Therapy recommendations:  CIR   Disposition:  Pending  Cerebellar edema / hydrocephalus  NSG on board - no intervention needed at this time  Repeat CT no significant hydrocephalus  Close neuro check  On 3% saline, increase to 75cc/h  Na 139 -> 143 -> 140  Had 23.4% once last night  Right LE DVT  Not candidate for anticoagulation  IVC filter placed  ? Alcohol withdraw  Pt admitted drinking alcohol  Not sure the amount and time of last drink  On CIWA protocol  B1/FA/MVI  Hypertension  BP 160-170s on arrival  On amlodipine, clonidine, HCTZ and metoprolol at home  Hydralazine PRN for BP control  Permissive hypertension (OK if < 180/105) but gradually normalize in 5-7 days  Long-term BP goal normotensive  Hyperlipidemia  Home meds:  No statin  LDL 135, goal < 70  Add lipitor 40mg   Cocaine abuse  UDS positive cocaine  Cessation counseling will be provided  Tobacco abuse  Current smoker  Smoking cessation counseling provided  Pt is willing to quit  Other Stroke Risk Factors     Other Active Problems  CKD Cre 1.52->1.47 -> 1.36  Agitation and depression - On zoloft  Psychosis - on seroquel   Mild leukocytosis - 12.8    Hospital day # 7 Discussed with Dr. Sloan Leiter. Mobilize out of bed. Continue aspirin. Will not consider TEE, loop due to  questions about patient`s compliance.Check repeat CT head due to headache   Antony Contras, MD Medical Director [place] Pager: [phone] 03/16/2017 12:48 PM   To contact Stroke Continuity provider, please refer to [email or web address] After hours, contact General Neurology

## 2017-03-16 NOTE — Clinical Social Work Placement (Signed)
   CLINICAL SOCIAL WORK PLACEMENT  NOTE  Date:  03/16/2017  Patient Details  Name: Darryl Parsons MRN: [id number] Date of Birth: [birth year]  Clinical Social Work is seeking post-discharge placement for this patient at the [place] level of care (*CSW will initial, date and re-position this form in  chart as items are completed):  Yes   Patient/family provided with [place] Work Department's list of facilities offering this level of care within the geographic area requested by the patient (or if unable, by the patient's family).  Yes   Patient/family informed of their freedom to choose among providers that offer the needed level of care, that participate in Medicare, Medicaid or managed care program needed by the patient, have an available bed and are willing to accept the patient.  Yes   Patient/family informed of [facility]'s ownership interest in [facility] and [facility], as well as of the fact that they are under no obligation to receive care at these facilities.  PASRR submitted to EDS on 03/16/17     PASRR number received on       Existing PASRR number confirmed on       FL2 transmitted to all facilities in geographic area requested by pt/family on 03/16/17     FL2 transmitted to all facilities within larger geographic area on       Patient informed that his/her managed care company has contracts with or will negotiate with certain facilities, including the following:            Patient/family informed of bed offers received.  Patient chooses bed at       Physician recommends and patient chooses bed at      Patient to be transferred to   on  .  Patient to be transferred to facility by       Patient family notified on   of transfer.  Name of family member notified:        PHYSICIAN Please sign FL2     Additional Comment:    _______________________________________________ Candie Chroman, LCSW 03/16/2017, 3:44 PM

## 2017-03-16 NOTE — Progress Notes (Signed)
Inpatient Rehabilitation  I received a call from [facility], PT that she had been able to work with pt., and he was able to participate and communicate effectively.  I visited pt. to discuss the possibility of CIR.  He refused to answer any of my questions and stated "don't make me cuss you out".  He closed his eyes and would not carry on further conversation.  Gunnar Fusi will follow up tomorrow.    [facility] Admissions Coordinator Cell [phone] Office [phone]

## 2017-03-16 NOTE — Progress Notes (Signed)
PROGRESS NOTE        PATIENT DETAILS Name: SUVAN STCYR Age: 61 y.o. Sex: male Date of Birth: Feb 13, 1956 Admit Date: 03/08/2017 Admitting Physician Kerney Elbe, MD OHY:[id number] NOT IN SYSTEM  Brief Narrative: Patient is a 61 y.o. 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 Admitted to the intensive care unit, and required 3% normal saline infusion, hospital course was subsequently complicated by development of severe delirium. Upon stability, and transferred to the [facility] hospitalist service on 03/16/17. See below for further details  Subjective: Awake and alert this morning. Complains of unchanged headache since this past Monday.   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 Echocardiogram with preserved EF-without any embolic source, CT angiogram of the neck without significant stenosis. LDL 135. Given long-standing history of cocaine use-not a candidate for further investigations-will likely not change management due to noncompliance. He does not have neurological deficits on my exam. Spoke with neurology-Dr. Sethi-recommends continuing aspirin, reevaluation by physical therapy and discharge either home or to CIR.  Cerebellar edema/hydrocephalus: Likely secondary to above, completed 3% normal saline therapy. Neurosurgery following. Has mild headache, but seems to have clinically improved compared to the past few days, neurology planning on repeat CT head if patient cooperates.  Delirium: Seems to have resolved, awake-alert and cooperative with me this morning. Continue supportive  measures.  Right lower extremity DVT: Given large CVA and risk of hemorrhagic conversion-not a candidate for anticoagulation. Furthermore given issues with cocaine/noncompliance/his underlying psychiatric issues-probably not a great long-term candidate for DVT. Underwent IVC filter placement on 4/26.  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  Hypertension: Blood pressure reasonably well controlled with amlodipine and transdermal clonidine. Would avoid beta blockers given history of cocaine use.  Dyslipidemia: On statin-LDL 135-and not at goal (less than 70)  Cocaine abuse: Counseled extensively-he is aware of life threatening and life disabling effects-including recurrent MI and CVA.  Tobacco abuse: Counseled.  History of schizoaffective disorder: Continue Zoloft, Seroquel. Seen by psychiatry during the early part of his hospital stay  DVT Prophylaxis: Prophylactic Lovenox   Code Status: Full code  Family Communication: None at bedside  Disposition Plan: Remain inpatient-await PT eval-CIR vs HHPT on discharge  Antimicrobial agents: Anti-infectives    None      Procedures: Echo 4/28>> - Left ventricle: The cavity size was normal. There was mild   concentric hypertrophy. Systolic function was normal. The   estimated ejection fraction was in the range of 60% to 65%. Wall   motion was normal; there were no regional wall motion   abnormalities. Doppler parameters are consistent with abnormal   left ventricular relaxation (grade 1 diastolic dysfunction).   There was no evidence of elevated ventricular filling pressure by   Doppler parameters. - Aortic valve: There was trivial regurgitation. - Aortic root: The aortic root was normal in size. - Mitral valve: Structurally normal valve. There was no   regurgitation. - Right ventricle: The cavity size was  normal.  Wall thickness was   normal. Systolic function was normal. - Right atrium: The atrium was normal in size. - Tricuspid valve: There was trivial regurgitation. - Pulmonary arteries: Systolic pressure was within the normal   range. - Inferior vena cava: The vessel was normal in size. - Pericardium, extracardiac: There was no pericardial effusion.  CONSULTS:  pulmonary/intensive care and neurology  Time spent: 25- minutes-Greater than 50% of this time was spent in counseling, explanation of diagnosis, planning of further management, and coordination of care.  MEDICATIONS: Scheduled Meds: . amLODipine  10 mg Oral Daily  . aspirin EC  325 mg Oral Daily  . atorvastatin  40 mg Oral q1800  . Chlorhexidine Gluconate Cloth  6 each Topical Daily  . cloNIDine  0.2 mg Transdermal Weekly  . enoxaparin (LOVENOX) injection  40 mg Subcutaneous Q24H  . [START ON 05/15/2457] folic acid  1 mg Oral Daily  . haloperidol lactate  5 mg Intravenous Q6H  . multivitamin with minerals  1 tablet Oral Daily  . QUEtiapine  300 mg Oral QHS  . sertraline  50 mg Oral Daily  . thiamine  100 mg Oral Daily   Or  . thiamine  100 mg Intravenous Daily   Continuous Infusions: . dexmedetomidine (PRECEDEX) IV infusion Stopped (03/15/17 1144)   PRN Meds:.acetaminophen **OR** [DISCONTINUED] acetaminophen (TYLENOL) oral liquid 160 mg/5 mL **OR** [DISCONTINUED] acetaminophen, butalbital-acetaminophen-caffeine, hydrALAZINE, hydrOXYzine, midazolam, ondansetron (ZOFRAN) IV, senna-docusate, ziprasidone   PHYSICAL EXAM: Vital signs: Vitals:   03/16/17 1204 03/16/17 1205 03/16/17 1212 03/16/17 1216  BP:      Pulse: 82 86 91 88  Resp: 18 15 12 12   Temp:      TempSrc:      SpO2: (!) 85% (!) 79% (!) 67% 100%  Weight:      Height:       Filed Weights   03/08/17 1728 03/09/17 0230  Weight: 87.1 kg (192 lb) 85.9 kg (189 lb 6 oz)   Body mass index is 28.79 kg/m.   General appearance :Awake, alert, not in any distress.    Eyes:, pupils equally reactive to light and accomodation,no scleral icterus. HEENT: Atraumatic and Normocephalic Neck: supple, no JVD. No cervical lymphadenopathy. Resp:Good air entry bilaterally, no added sounds  CVS: S1 S2 regular, no murmurs.  GI: Bowel sounds present, Non tender and not distended with no gaurding, rigidity or rebound Extremities: B/L Lower Ext shows no edema, both legs are warm to touch Neurology:  speech clear,Non focal Musculoskeletal:No digital cyanosis Skin:No Rash, warm and dry Wounds:N/A  I have personally reviewed following labs and imaging studies  LABORATORY DATA: CBC:  Recent Labs Lab 03/12/17 0500 03/13/17 0422 03/14/17 1220 03/15/17 0622 03/16/17 0214  WBC 10.0 7.6 8.7 8.9 11.5*  NEUTROABS  --  4.5 6.1 5.3 7.3  HGB 12.2* 12.3* 13.2 11.9* 12.5*  HCT 37.7* 36.7* 38.7* 35.0* 37.5*  MCV 83.0 83.0 82.5 81.6 81.7  PLT 189 192 195 190 099    Basic Metabolic Panel:  Recent Labs Lab 03/11/17 0855 03/11/17 1215  03/12/17 0500  03/13/17 0045 03/13/17 0422  03/14/17 0535 03/14/17 1105 03/14/17 1835 03/15/17 0025 03/15/17 0622 03/16/17 0214  NA 140 140  < > 146*  < > 143 142  < > 141 138 138 138 138  --   K 7.2* >7.5*  --  3.1*  --  3.2*  --   --   --   --   --   --  3.3*  --  CL 117* 116*  --  121*  --  116*  --   --   --   --   --   --  110  --   CO2 18* 18*  --  19*  --  19*  --   --   --   --   --   --  22  --   GLUCOSE 112* 115*  --  105*  --  98  --   --   --   --   --   --  104*  --   BUN 11 10  --  12  --  7  --   --   --   --   --   --  16  --   CREATININE 1.36* 1.40*  --  1.44*  --  1.25*  --   --   --   --   --   --  1.72* 1.72*  CALCIUM 8.5* 8.5*  --  8.5*  --  8.4*  --   --   --   --   --   --  8.6*  --   MG  --   --   --   --   --   --  1.7  --  2.1  --   --   --  2.0 2.0  PHOS  --   --   --   --   --   --  1.6*  --  2.9  --   --   --  4.1 3.8  < > = values in this interval not displayed.  GFR: Estimated Creatinine  Clearance: 49.3 mL/min (A) (by C-G formula based on SCr of 1.72 mg/dL (H)).  Liver Function Tests: No results for input(s): AST, ALT, ALKPHOS, BILITOT, PROT, ALBUMIN in the last 168 hours. No results for input(s): LIPASE, AMYLASE in the last 168 hours. No results for input(s): AMMONIA in the last 168 hours.  Coagulation Profile:  Recent Labs Lab 03/10/17 0803  INR 0.98    Cardiac Enzymes: No results for input(s): CKTOTAL, CKMB, CKMBINDEX, TROPONINI in the last 168 hours.  BNP (last 3 results) No results for input(s): PROBNP in the last 8760 hours.  HbA1C: No results for input(s): HGBA1C in the last 72 hours.  CBG: No results for input(s): GLUCAP in the last 168 hours.  Lipid Profile: No results for input(s): CHOL, HDL, LDLCALC, TRIG, CHOLHDL, LDLDIRECT in the last 72 hours.  Thyroid Function Tests: No results for input(s): TSH, T4TOTAL, FREET4, T3FREE, THYROIDAB in the last 72 hours.  Anemia Panel: No results for input(s): VITAMINB12, FOLATE, FERRITIN, TIBC, IRON, RETICCTPCT in the last 72 hours.  Urine analysis:    Component Value Date/Time   COLORURINE YELLOW 10/13/2016 [place] 10/13/2016 0857   LABSPEC 1.016 10/13/2016 0857   PHURINE 6.5 10/13/2016 0857   GLUCOSEU 100 (A) 10/13/2016 0857   HGBUR SMALL (A) 10/13/2016 0857   BILIRUBINUR NEGATIVE 10/13/2016 0857   KETONESUR NEGATIVE 10/13/2016 0857   PROTEINUR 30 (A) 10/13/2016 0857   UROBILINOGEN 0.2 09/06/2014 2224   NITRITE NEGATIVE 10/13/2016 0857   LEUKOCYTESUR NEGATIVE 10/13/2016 0857    Sepsis Labs: Lactic Acid, Venous    Component Value Date/Time   LATICACIDVEN 0.97 01/30/2016 1738    MICROBIOLOGY: Recent Results (from the past 240 hour(s))  MRSA PCR Screening     Status: None   Collection Time: 03/09/17  2:34 AM  Result  Value Ref Range Status   MRSA by PCR NEGATIVE NEGATIVE Final    Comment:        The GeneXpert MRSA Assay (FDA approved for NASAL specimens only), is one  component of a comprehensive MRSA colonization surveillance program. It is not intended to diagnose MRSA infection nor to guide or monitor treatment for MRSA infections.     RADIOLOGY STUDIES/RESULTS: Ct Angio Head W Or Wo Contrast  Result Date: 03/11/2017 CLINICAL DATA:  Follow-up CVA EXAM: CT ANGIOGRAPHY HEAD AND NECK TECHNIQUE: Multidetector CT imaging of the head and neck was performed using the standard protocol during bolus administration of intravenous contrast. Multiplanar CT image reconstructions and MIPs were obtained to evaluate the vascular anatomy. Carotid stenosis measurements (when applicable) are obtained utilizing NASCET criteria, using the distal internal carotid diameter as the denominator. CONTRAST:  50 mL Isovue 370 COMPARISON:  Head CT 03/10/2017 FINDINGS: CTA NECK FINDINGS Aortic arch: There is no aneurysm or dissection of the visualized ascending aorta or aortic arch. There is a normal 3 vessel branching pattern. The visualized proximal subclavian arteries are normal. Right carotid system: The right common carotid origin is widely patent. There is no common carotid or internal carotid artery dissection or aneurysm. No hemodynamically significant stenosis. Left carotid system: The left common carotid origin is widely patent. There is no common carotid or internal carotid artery dissection or aneurysm. No hemodynamically significant stenosis. Vertebral arteries: The vertebral system is codominant. Both vertebral artery origins are normal. Both vertebral arteries are normal to their confluence with the basilar artery. Skeleton: There is no bony spinal canal stenosis. No lytic or blastic lesions. Other neck: The nasopharynx is clear. The oropharynx and hypopharynx are normal. The epiglottis is normal. The supraglottic larynx, glottis and subglottic larynx are normal. No retropharyngeal collection. The parapharyngeal spaces are preserved. The parotid and submandibular glands are  normal. No sialolithiasis or salivary ductal dilatation. The thyroid gland is normal. There is no cervical lymphadenopathy. Upper chest: No pneumothorax or pleural effusion. No nodules or masses. Review of the MIP images confirms the above findings CTA HEAD FINDINGS Anterior circulation: --Intracranial internal carotid arteries: Normal. --Anterior cerebral arteries: Normal. --Middle cerebral arteries: Normal. --Posterior communicating arteries: Absent bilaterally. Posterior circulation: --Posterior cerebral arteries: Normal. --Superior cerebellar arteries: Normal. --Basilar artery: Normal. --Anterior inferior cerebellar arteries: Normal on the right. Not clearly seen on the left. --Posterior inferior cerebellar arteries: Normal on the right but not visualized on the left. Venous sinuses: As permitted by contrast timing, patent. Anatomic variants: None Delayed phase: No parenchymal contrast enhancement. Area of cytotoxic edema in the left PICA distribution is unchanged in size, with persistent narrowing of the fourth ventricle and basal cisterns. The sizes of the lateral ventricle temporal horns are unchanged. No hemorrhage. Unchanged crowding of the foramen magnum. Review of the MIP images confirms the above findings IMPRESSION: 1. 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 2. Left AICA is not clearly visualized, but the AICA origin and caliber are highly variable and it is often not identified on scans of normal patients. 3. Unchanged left cerebellar cytotoxic edema with mass effect on the fourth ventricle and basal cisterns. Crowding of the foramen magnum is also unchanged. 4. No hemorrhage or hydrocephalus. Electronically Signed   By: Ulyses Jarred M.D.   On: 03/11/2017 06:34   Dg Chest 2 View  Result Date: 03/09/2017 CLINICAL DATA:  Headache, dizziness for today, nausea and vomiting EXAM: CHEST  2 VIEW COMPARISON:  Chest x-ray of 10/13/2016 FINDINGS: No active  infiltrate or effusion is seen. Minimal volume loss is noted at the right lung base. Mediastinal and hilar contours are unremarkable. The heart is borderline enlarged and stable. No bony abnormality is seen. IMPRESSION: No active lung disease. Minimal volume loss at the right lung base. Stable borderline cardiomegaly. Electronically Signed   By: Ivar Drape M.D.   On: 03/09/2017 11:11   Ct Head Wo Contrast  Result Date: 03/13/2017 CLINICAL DATA:  Follow-up stroke. EXAM: CT HEAD WITHOUT CONTRAST TECHNIQUE: Contiguous axial images were obtained from the base of the skull through the vertex without intravenous contrast. COMPARISON:  MRI of the head March 08, 2017 and CT HEAD March 10, 2017 FINDINGS: BRAIN: Evolving LEFT cerebellar infarct with ex cerebellar folia effacement. Similar mass effect on the fourth ventricle with mild hydrocephalus. No new infarcts. No hemorrhagic conversion. Mildly effaced super of sellar cistern is similar. Patchy supratentorial white matter hypodensities unchanged. Old small LEFT occipital lobe infarcts. VASCULAR: Trace calcific atherosclerosis of the carotid siphons. SKULL: No skull fracture. Old bilateral nasal bone fractures. Small RIGHT frontal scalp lipoma. No significant scalp soft tissue swelling. SINUSES/ORBITS: Trace RIGHT mastoid effusion. Old LEFT medial orbital blowout fracture. OTHER: None. IMPRESSION: Evolving LEFT posterior-inferior cerebellar artery territory infarct nearly effacing the fourth ventricle resulting in stable mild hydrocephalus. Moderate chronic small vessel ischemic disease and old small LEFT occipital lobe infarct. Electronically Signed   By: Elon Alas M.D.   On: 03/13/2017 04:51   Ct Head Wo Contrast  Addendum Date: 03/10/2017   ADDENDUM REPORT: 03/10/2017 10:32 ADDENDUM: Study discussed by telephone with Dr. Rosalin Hawking on 03/10/2017 at 1025 hours. Electronically Signed   By: Genevie Ann M.D.   On: 03/10/2017 10:32   Result Date:  03/10/2017 CLINICAL DATA:  61 year old male with large left PICA infarct. Recent headache and nausea. EXAM: CT HEAD WITHOUT CONTRAST TECHNIQUE: Contiguous axial images were obtained from the base of the skull through the vertex without intravenous contrast. COMPARISON:  Head CT without contrast 03/09/2017. Brain MRI 03/08/2017, and earlier FINDINGS: Brain: Continued confluent cytotoxic edema throughout the left cerebellum. Mass effect on the fourth ventricle appears mildly progressed, and the temporal horns are mildly larger than yesterday. See series 3, image 9). Other posterior fossa mass effect has not significantly changed. Basilar cisterns are stable. No transependymal edema is evident. No supratentorial mass effect or midline shift. No acute intracranial hemorrhage identified. Stable gray-white matter differentiation throughout the brain. Vascular: No suspicious intracranial vascular hyperdensity. Skull: Chronic left lamina papyracea fracture. No acute osseous abnormality identified. Sinuses/Orbits: Visualized paranasal sinuses and mastoids are stable and well pneumatized. Other: No acute orbit or scalp soft tissue findings. IMPRESSION: 1. Continued moderate to severe left cerebellar edema with interval mildly increased mass effect on the fourth ventricle and early lateral ventriculomegaly - mild enlargement of the temporal horn since yesterday but no transependymal edema is evident. 2. No hemorrhagic transformation or new intracranial abnormality. Electronically Signed: By: Genevie Ann M.D. On: 03/10/2017 10:22   Ct Head Wo Contrast  Result Date: 03/09/2017 CLINICAL DATA:  Headache and nausea.  No trauma. EXAM: CT HEAD WITHOUT CONTRAST TECHNIQUE: Contiguous axial images were obtained from the base of the skull through the vertex without intravenous contrast. COMPARISON:  MR brain 03/08/2017. FINDINGS: Brain: Extensive cytotoxic edema in the cerebellum, associated with a LEFT PICA territory infarct. 3 mm  LEFT-to-RIGHT shift of the fourth ventricle is increased. No hemorrhagic transformation. No hydrocephalus. No other areas  of infarction are not clearly visible. Vascular: No proximal large vessel occlusion. Hyperdensity of the LEFT PICA correlates with lack of flow on MR. Skull: Normal. Negative for fracture or focal lesion. Sinuses/Orbits: No acute finding. Other: None. IMPRESSION: Extensive cytotoxic edema in the cerebellum associated with the documented LEFT PICA territory infarct. 3 mm LEFT-to-RIGHT shift of the fourth ventricle is increased from yesterday's MR. Close surveillance recommended for symptomatic tonsillar herniation, future hemorrhagic transformation, and/or development of hydrocephalus. These results will be called to the ordering clinician or representative by the Radiologist Assistant, and communication documented in the PACS or zVision Dashboard. Electronically Signed   By: Staci Righter M.D.   On: 03/09/2017 11:03   Ct Angio Neck W Or Wo Contrast  Result Date: 03/11/2017 CLINICAL DATA:  Follow-up CVA EXAM: CT ANGIOGRAPHY HEAD AND NECK TECHNIQUE: Multidetector CT imaging of the head and neck was performed using the standard protocol during bolus administration of intravenous contrast. Multiplanar CT image reconstructions and MIPs were obtained to evaluate the vascular anatomy. Carotid stenosis measurements (when applicable) are obtained utilizing NASCET criteria, using the distal internal carotid diameter as the denominator. CONTRAST:  50 mL Isovue 370 COMPARISON:  Head CT 03/10/2017 FINDINGS: CTA NECK FINDINGS Aortic arch: There is no aneurysm or dissection of the visualized ascending aorta or aortic arch. There is a normal 3 vessel branching pattern. The visualized proximal subclavian arteries are normal. Right carotid system: The right common carotid origin is widely patent. There is no common carotid or internal carotid artery dissection or aneurysm. No hemodynamically significant  stenosis. Left carotid system: The left common carotid origin is widely patent. There is no common carotid or internal carotid artery dissection or aneurysm. No hemodynamically significant stenosis. Vertebral arteries: The vertebral system is codominant. Both vertebral artery origins are normal. Both vertebral arteries are normal to their confluence with the basilar artery. Skeleton: There is no bony spinal canal stenosis. No lytic or blastic lesions. Other neck: The nasopharynx is clear. The oropharynx and hypopharynx are normal. The epiglottis is normal. The supraglottic larynx, glottis and subglottic larynx are normal. No retropharyngeal collection. The parapharyngeal spaces are preserved. The parotid and submandibular glands are normal. No sialolithiasis or salivary ductal dilatation. The thyroid gland is normal. There is no cervical lymphadenopathy. Upper chest: No pneumothorax or pleural effusion. No nodules or masses. Review of the MIP images confirms the above findings CTA HEAD FINDINGS Anterior circulation: --Intracranial internal carotid arteries: Normal. --Anterior cerebral arteries: Normal. --Middle cerebral arteries: Normal. --Posterior communicating arteries: Absent bilaterally. Posterior circulation: --Posterior cerebral arteries: Normal. --Superior cerebellar arteries: Normal. --Basilar artery: Normal. --Anterior inferior cerebellar arteries: Normal on the right. Not clearly seen on the left. --Posterior inferior cerebellar arteries: Normal on the right but not visualized on the left. Venous sinuses: As permitted by contrast timing, patent. Anatomic variants: None Delayed phase: No parenchymal contrast enhancement. Area of cytotoxic edema in the left PICA distribution is unchanged in size, with persistent narrowing of the fourth ventricle and basal cisterns. The sizes of the lateral ventricle temporal horns are unchanged. No hemorrhage. Unchanged crowding of the foramen magnum. Review of the MIP  images confirms the above findings IMPRESSION: 1. 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 2. Left AICA is not clearly visualized, but the AICA origin and caliber are highly variable and it is often not identified on scans of normal patients. 3. Unchanged left cerebellar cytotoxic edema with mass effect on the fourth ventricle and basal cisterns.  Crowding of the foramen magnum is also unchanged. 4. No hemorrhage or hydrocephalus. Electronically Signed   By: Ulyses Jarred M.D.   On: 03/11/2017 06:34   [name]  Result Date: 03/08/2017 CLINICAL DATA:  Vertigo EXAM: MRI HEAD WITHOUT CONTRAST TECHNIQUE: Multiplanar, multiecho pulse sequences of the brain and surrounding structures were obtained without intravenous contrast. COMPARISON:  Head CT 08/27/2016 FINDINGS: The examination had to be discontinued prior to completion due to patient refusal to continue. Brain: There is a large area of diffusion restriction within the left cerebellar hemisphere, in the PICA territory, with associated cytotoxic edema and rightward displacement of the midline posterior fossa structures. There is also a small focus of diffusion restriction within the left occipital lobe. No acute hemorrhage. The basal cisterns, cerebral aqueduct and fourth ventricle remain patent. No hydrocephalus or extra-axial fluid collection. The midline structures are normal. No age advanced or lobar predominant atrophy. There is multifocal hyperintense T2-weighted signal within the periventricular white matter, most often seen in the setting of chronic microvascular ischemia. Vascular: Major intracranial arterial and venous sinus flow voids are preserved. There are scattered foci of chronic microhemorrhage within both cerebral hemispheres, the brainstem and cerebellum. The distribution is nonspecific. Skull and upper cervical spine: The visualized skull base, calvarium, upper cervical spine and  extracranial soft tissues are normal. Sinuses/Orbits: No fluid levels or advanced mucosal thickening. No mastoid effusion. Normal orbits. IMPRESSION: 1. Truncated examination due to patient refusal to complete all sequences. 2. 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 The basal cisterns and fourth ventricle remain patent without obstructive hydrocephalus. No acute hemorrhage. 3. Small left occipital acute infarct. 4. Chronic microvascular ischemia and scattered punctate foci of chronic microhemorrhage. These results will be called to the ordering clinician or representative by the Radiologist Assistant, and communication documented in the PACS or zVision Dashboard. Electronically Signed   By: Ulyses Jarred M.D.   On: 03/08/2017 23:25   Ir Ivc Filter Plmt / S&i /img Guid/mod Sed  Result Date: 03/10/2017 CLINICAL DATA:  Lower extremity DVT. 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 Elevated creatinine. EXAM: INFERIOR VENACAVOGRAM IVC FILTER PLACEMENT UNDER FLUOROSCOPY FLUOROSCOPY TIME:  1 minutes 24 seconds, 49 mGy TECHNIQUE: The procedure, risks (including but not limited to bleeding, infection, organ damage ), benefits, and alternatives were explained to the patient. Questions regarding the procedure were encouraged and answered. The patient understands and consents to the procedure. Patency of the right IJ vein was confirmed with ultrasound with image documentation. An appropriate skin site was determined. Skin site was marked, prepped with chlorhexidine, and draped using maximum barrier technique. The region was infiltrated locally with 1% lidocaine. Intravenous Fentanyl and Versed were administered as conscious sedation during continuous monitoring of the patient's level of consciousness and physiological / cardiorespiratory status by the radiology RN, with a total moderate sedation time of  11 minutes. Under real-time ultrasound guidance, the right IJ vein was accessed with a 21 gauge micropuncture needle; the needle tip within the vein was confirmed with ultrasound image documentation. The needle was exchanged over a 018 guidewire for a transitional dilator, which allow advancement of the long 6 [place] vascular sheath placed for CO2 inferior venacavography. This demonstrated no caval thrombus. Renal vein inflows were evident, corresponding to findings on previous abdomen CT. The [facility] IVC filter was advanced through the sheath and successfully deployed under fluoroscopy at the L3 level. Followup cavagram demonstrates stable  filter position and no evident complication. The sheath was removed and hemostasis achieved at the site. No immediate complication. IMPRESSION: 1. Normal IVC. No thrombus or significant anatomic variation. 2. Technically successful infrarenal IVC filter placement. This is a retrievable model. PLAN: This IVC filter is potentially retrievable. The patient will be assessed for filter retrieval by Interventional Radiology in approximately 8-12 weeks. Further recommendations regarding filter retrieval, continued surveillance or declaration of device permanence, will be made at that time. Electronically Signed   By: Lucrezia Europe M.D.   On: 03/10/2017 12:33   Dg Chest Port 1 View  Result Date: 03/10/2017 CLINICAL DATA:  Central venous catheter placement. EXAM: PORTABLE CHEST 1 VIEW COMPARISON:  03/09/2017 and prior radiographs FINDINGS: Upper limits normal heart size, mediastinal fullness and elevation of the right hemidiaphragm are unchanged. A left IJ central venous catheter is noted with tip overlying the upper SVC. There is no evidence of focal airspace disease, pulmonary edema, suspicious pulmonary nodule/mass, pleural effusion, or pneumothorax. No acute bony abnormalities are identified. IMPRESSION: Left IJ central venous catheter with tip overlying the upper SVC. No evidence of  pneumothorax. Electronically Signed   By: Margarette Canada M.D.   On: 03/10/2017 12:45     LOS: 7 days   Oren Binet, MD  [facility] Pager:[phone]  If 7PM-7AM, please contact night-coverage [email or web address] Password [facility] 03/16/2017, 12:38 PM

## 2017-03-16 NOTE — Progress Notes (Signed)
PT Cancellation Note  Patient Details Name: [name] MRN: [id number] DOB: 1955-12-16   Cancelled Treatment:    Reason Eval/Treat Not Completed: Fatigue/lethargy limiting ability to participate; patient with pain controlled and wanting to rest.  Attempted to encourage mobility.  Will attempt again later as time permits.   Reginia Naas 03/16/2017, 11:05 AM  Magda Kiel, [place] 03/16/2017

## 2017-03-16 NOTE — Progress Notes (Signed)
I have made several attempts to phone pt's "wife", Reggy Eye, at [employer] [phone]).  Thus far, I have been unable to reach her by phone, and have not seen her at the bedside.    I left a message requesting she call me. I would like to discuss her availability to assist in pt's care following a potential CIR admission if his psychiatric status improves to be amenable to CIR.  Pt. currently sleeping/lethargic and I left him undisturbed.   My co-worker Gunnar Fusi will follow along Thursday and Friday in my absence and can be reached at [phone].    [place] Admissions Coordinator Cell [phone] Office [phone]

## 2017-03-16 NOTE — Clinical Social Work Note (Signed)
CSW attempted to speak with patient regarding SNF placement. Patient turned facing the wall and would not respond to CSW calling his name. CSW will try again later. Per RN, patient out of restraints and has been calm and cooperative. Telesitter still in the room.  Darryl Parsons, [place]

## 2017-03-17 ENCOUNTER — Inpatient Hospital Stay (HOSPITAL_COMMUNITY): Payer: Medicaid Other

## 2017-03-17 LAB — BASIC METABOLIC PANEL
ANION GAP: 12 (ref 5–15)
BUN: 19 mg/dL (ref 6–20)
CALCIUM: 8.9 mg/dL (ref 8.9–10.3)
CO2: 16 mmol/L — AB (ref 22–32)
Chloride: 107 mmol/L (ref 101–111)
Creatinine, Ser: 1.73 mg/dL — ABNORMAL HIGH (ref 0.61–1.24)
GFR calc Af Amer: 48 mL/min — ABNORMAL LOW (ref >60)
GFR, EST NON AFRICAN AMERICAN: 41 mL/min — AB (ref >60)
GLUCOSE: 107 mg/dL — AB (ref 65–99)
Potassium: 5 mmol/L (ref 3.5–5.1)
Sodium: 135 mmol/L (ref 135–145)

## 2017-03-17 LAB — URINALYSIS, ROUTINE W REFLEX MICROSCOPIC
BILIRUBIN URINE: NEGATIVE
Glucose, UA: NEGATIVE mg/dL
Ketones, ur: NEGATIVE mg/dL
Nitrite: NEGATIVE
PH: 6 (ref 5.0–8.0)
Protein, ur: 100 mg/dL — AB
SPECIFIC GRAVITY, URINE: 1.023 (ref 1.005–1.030)

## 2017-03-17 LAB — CK: CK TOTAL: 188 U/L (ref 49–397)

## 2017-03-17 LAB — TROPONIN I: Troponin I: 0.05 ng/mL (ref <0.03)

## 2017-03-17 MED ORDER — HYDROCODONE-ACETAMINOPHEN 10-325 MG PO TABS
1.0000 | ORAL_TABLET | Freq: Four times a day (QID) | ORAL | Status: DC | PRN
  Administered 2017-03-17 – 2017-03-18 (×4): 1 via ORAL
  Filled 2017-03-17 (×4): qty 1

## 2017-03-17 MED ORDER — HYDROCHLOROTHIAZIDE 12.5 MG PO CAPS
12.5000 mg | ORAL_CAPSULE | Freq: Every day | ORAL | Status: DC
  Administered 2017-03-19 – 2017-03-20 (×2): 12.5 mg via ORAL
  Filled 2017-03-17 (×3): qty 1

## 2017-03-17 MED ORDER — ASPIRIN 325 MG PO TABS
325.0000 mg | ORAL_TABLET | Freq: Every day | ORAL | Status: DC
  Administered 2017-03-17 – 2017-03-20 (×4): 325 mg via ORAL
  Filled 2017-03-17 (×4): qty 1

## 2017-03-17 NOTE — Progress Notes (Signed)
Pt refused troponin explained to pt reason for labs pt still refused labs.

## 2017-03-17 NOTE — Progress Notes (Signed)
Pt continues to refuse telemetry, and is refusing to let the tech do vital signs,  Again encourage d pt to let us get his vitals and to put him back on tele pt refuses

## 2017-03-17 NOTE — Progress Notes (Signed)
Pt is refusing to let my self or my staff put his telemetry back on explained to pt the risk he takes if he cannot be Monitored pt states " he dosent Care"

## 2017-03-17 NOTE — Progress Notes (Signed)
STROKE TEAM PROGRESS NOTE   SUBJECTIVE (INTERVAL HISTORY) Patient Continues to have behavioral issues and refuses medications and therapy. CT scan of the head without contrast today shows stable appearance of the cerebellar infarct without any significant hydrocephalus.  Temp:  [98.4 F (36.9 C)-103.1 F (39.5 C)] 98.4 F (36.9 C) (05/03 1126) Pulse Rate:  [97-122] 97 (05/03 1126) Cardiac Rhythm: Normal sinus rhythm (05/03 1123) Resp:  [15-43] 15 (05/03 1126) BP: (140-188)/(74-99) 152/96 (05/03 1126) SpO2:  [94 %-98 %] 96 % (05/03 1126)  CBC:   Recent Labs Lab 03/15/17 0622 03/16/17 0214  WBC 8.9 11.5*  NEUTROABS 5.3 7.3  HGB 11.9* 12.5*  HCT 35.0* 37.5*  MCV 81.6 81.7  PLT 190 585    Basic Metabolic Panel:   Recent Labs Lab 03/15/17 0622 03/16/17 0214 03/17/17 0824  NA 138  --  135  K 3.3*  --  5.0  CL 110  --  107  CO2 22  --  16*  GLUCOSE 104*  --  107*  BUN 16  --  19  CREATININE 1.72* 1.72* 1.73*  CALCIUM 8.6*  --  8.9  MG 2.0 2.0  --   PHOS 4.1 3.8  --     Lipid Panel:     Component Value Date/Time   CHOL 226 (H) 03/09/2017 0408   TRIG 85 03/09/2017 0408   HDL 74 03/09/2017 0408   CHOLHDL 3.1 03/09/2017 0408   VLDL 17 03/09/2017 0408   LDLCALC 135 (H) 03/09/2017 0408   HgbA1c:  Lab Results  Component Value Date   HGBA1C <4.2 (L) 03/09/2017   Urine Drug Screen:     Component Value Date/Time   LABOPIA POSITIVE (A) 03/09/2017 0930   COCAINSCRNUR POSITIVE (A) 03/09/2017 0930   COCAINSCRNUR (A) 12/09/2007 0230    POSITIVE (NOTE) Result repeated and verified. Sent for confirmatory testing   LABBENZ POSITIVE (A) 03/09/2017 0930   LABBENZ NEGATIVE 12/09/2007 0230   AMPHETMU NONE DETECTED 03/09/2017 0930   THCU NONE DETECTED 03/09/2017 0930   LABBARB NONE DETECTED 03/09/2017 0930    Alcohol Level     Component Value Date/Time   ETH <5 10/06/2016 2778    IMAGING I have personally reviewed the radiological images below and agree with the  radiology interpretations.  [name] 03/08/2017 1. Truncated examination due to patient refusal to complete all sequences.  2. 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 The basal cisterns and fourth ventricle remain patent without obstructive hydrocephalus. No acute hemorrhage.  3. Small left occipital acute infarct.  4. Chronic microvascular ischemia and scattered punctate foci of chronic microhemorrhage.   Ct Head Wo Contrast 03/10/2017 1. Continued moderate to severe left cerebellar edema with interval mildly increased mass effect on the fourth ventricle and early lateral ventriculomegaly - mild enlargement of the temporal horn since yesterday but no transependymal edema is evident.  2. No hemorrhagic transformation or new intracranial abnormality.    LE venous doppler - Positive for acute deep vein thrombosis involving the right peroneal veins and a short segment of the right posterior tibial veins at the proximal and mid level, and thrombus in the proximal muscular calf gastro veins.  TTE -  normal  CTA Head and Neck  03/11/2017 1. 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 2. Left AICA is not clearly visualized, but the AICA origin and caliber are highly  variable and it is often not identified on scans of normal patients. 3. Unchanged left cerebellar cytotoxic edema with mass effect on the fourth ventricle and basal cisterns. Crowding of the foramen magnum is also unchanged. 4. No hemorrhage or hydrocephalus.  2DEcho 03/12/2017 : Left ventricle: The cavity size was normal. There was mild   concentric hypertrophy. Systolic function was normal. The   estimated ejection fraction was in the range of 60% to 65%. Wall   motion was normal; there were no regional wall motion   abnormalities PHYSICAL EXAM  Temp:  [98.4 F (36.9  C)-103.1 F (39.5 C)] 98.4 F (36.9 C) (05/03 1126) Pulse Rate:  [97-122] 97 (05/03 1126) Resp:  [15-43] 15 (05/03 1126) BP: (140-188)/(74-99) 152/96 (05/03 1126) SpO2:  [94 %-98 %] 96 % (05/03 1126)  General - Well nourished, well developed, delirium with agitation and confusion.  Ophthalmologic - Fundi not visualized due to noncooperation.  Cardiovascular - Regular rate and rhythm.  Neuro -  awake and interactive and will follow commands. Diminished attention, registration and recall.[name]  PERRL, facial symmetrical, tongue midline. Moving BUE and BLE equally,  DTR 1+. Sensation symmetrical, gait not tested.    ASSESSMENT/PLAN [name] is a 61 y.o. male with history of HTN, CKD, cocaine use and mental disorder presenting with severe HA, nausea, vomiting, dizziness. He did not receive IV t-PA due to delay in arrival.   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 Thrombotic due to cocaine use  Resultant  Nystagmus and left eye abduction deficit, left UE mild dysmetria  NS consulted (Pool). ICU monitoring with f/u imaging for hydrocephalus eval  MRI  Truncated d/t pt refusal. Large L PICA infarct w/ cytotoxic edea and Rightward shift. No hydrocephalus. No hmg. Small L occipital infarct.   Repeat CT head mild hydrocephalus  LE venous Doppler  Right LE DVT  2D Echo  normal  CTA head and neck - Non-opacification of the left PICA, in keeping with acute left PICA territory infarct.  Consider outpt 30 day cardiac event  monitoring to rule out afib as outpt but patient likely to be noncomplaint due to his behaviour so far  UDS - positive cocaine, opiates and benzo  LDL 135  HgbA1c < 4.2  Lovenox 40 mg sq daily for VTE prophylaxis  Diet Heart Room service appropriate? Yes; Fluid consistency: Thin.   No antithrombotic prior to admission, now on aspirin 325 mg daily.   Patient counseled to be compliant with his antithrombotic medications  Ongoing aggressive stroke risk factor management  Therapy recommendations:  CIR   Disposition:  Pending  Cerebellar edema / hydrocephalus  NSG on board - no intervention needed at this time  Repeat CT no significant hydrocephalus  Close neuro check  On 3% saline, increase to 75cc/h  Na 139 -> 143 -> 140  Right LE DVT  Not candidate for anticoagulation  IVC filter placed  ? Alcohol withdraw  Pt admitted drinking alcohol  Not sure the amount and time of last drink  On CIWA protocol  B1/FA/MVI  Hypertension  BP 160-170s on arrival  On amlodipine, clonidine, HCTZ and metoprolol at home  Hydralazine PRN for BP control  Permissive hypertension (OK if < 180/105) but gradually normalize in 5-7 days  Long-term BP goal normotensive  Hyperlipidemia  Home meds:  No statin  LDL 135, goal < 70  Add lipitor 40mg   Cocaine abuse  UDS positive cocaine  Cessation counseling will be provided  Tobacco abuse  Current smoker  Smoking cessation counseling provided  Pt is willing to quit  Other Stroke Risk Factors     Other Active Problems  CKD Cre 1.52->1.47 -> 1.36  Agitation and depression - On zoloft  Psychosis - on seroquel   Mild leukocytosis - 12.8    Hospital day # 8 Discussed with Dr. Sloan Leiter. Mobilize out of bed. Continue aspirin. Will not consider TEE, loop due to questions about patient`s compliance. repeat CT head is stable and his headache has also improved. Stroke team will sign off. Kindly call for questions    Antony Contras, MD Medical Director [place] Pager: [phone] 03/17/2017 2:00 PM   To contact Stroke Continuity provider, please refer to [email or web address] After hours, contact General Neurology

## 2017-03-17 NOTE — Progress Notes (Addendum)
Went to let pt know that ct will be coming to repeat his head ct, pt says no. I explained to the pt that we really need to get the head ct he said " ya'll can keep needing"    K Baltazar Najjar made aware

## 2017-03-17 NOTE — Progress Notes (Signed)
PROGRESS NOTE        PATIENT DETAILS Name: Darryl Parsons Age: 61 y.o. Sex: male Date of Birth: 1956-08-24 Admit Date: 03/08/2017 Admitting Physician Kerney Elbe, MD BSJ:[id number] NOT IN SYSTEM  Brief Narrative: Patient is a 61 y.o. 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 Admitted to the intensive care unit, and required 3% normal saline infusion, hospital course was subsequently complicated by development of severe delirium. Upon stability, and transferred to the [facility] hospitalist service on 03/16/17. See below for further details  Subjective: Events noted-re last night.Fever last night  Sleeping comfortably when I walked in. Easily awoke-minimal headache this morning. I was able to talk to him-he does not want telemetry, lipitor ( due to headache/body aches) and morphine. Acceptable to get morning labs, and is agreeable to get a CT head.   Per RN-required two-person assistance when he worked with physical therapy yesterday. Apparently had significant dizziness when he stood up and ambulated.  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  Echocardiogram without any obvious embolic source. CT angiogram of the neck with no significant stenosis. LDL 135. Difficult situation-refusing telemetry monitoring/multiple medications-has a long-standing history of drug abuse and significant psychiatric comorbidities-due to these reasons, likely not a candidate for further investigations including but the TEE-as it will likely not change management. This had spoken with Dr. Duanne Moron neurology yesterday-recommendations are to  continue with aspirin. Reassessed by physical therapy yesterday-recommendations are for CIR-however he will need to be more cooperative. We will consult psychiatry-for medication management assistance and to assess capacity-as patient continues to be uncooperative at times and refuses certain medications/investigations/telemetry. He he will need to be more cooperative to consider discharge to CIR or even SNF. Difficult situation-await psychiatric input.   In the meantime, I have counseled him extensively this morning regarding importance of allowing medical staff/nursing staff to perform labs/CT head etc.  Cerebellar edema/hydrocephalus: Likely secondary to above, completed 3% normal saline therapy. Neurosurgery and neurology followed closely. Headache has improved today, he refuses CT of the head yesterday-but is okay with proceeding with today. See above regarding psychiatry consult.   Fever: Occurred last night-no obvious source on exam-RRR and not coughing- no diarrhea. He does not appear rigid-mentation appears unchanged from yesterday. Check blood cultures, get CBC-he is agreeable for labs morning.  Delirium: His delirium that was present when he was in the ICU has resolved, he remains intermittently uncooperative to the nursing staff-refusing some medications and treatment. I think this is secondary to his underlying psychiatric issues-have consulted psychiatry. As noted above, he is awake, and pretty much oriented and alert during my exam this morning.   Right lower extremity DVT: Given large CVA and risk of hemorrhagic conversion-not a candidate for anticoagulation. Furthermore given issues with cocaine/noncompliance/his underlying psychiatric issues-probably not a great long-term candidate for DVT. Underwent IVC filter placement on 4/26.  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  Hypertension: Blood pressure uncontrolled this morning-he apparently refused numerous medications last night-for now would continue with amlodipine, transdermal  clonidine-he wants to restart his diuretics-we will restart HCTZ and monitor electrolytes. Would avoid beta blockers given history of cocaine use.  Dyslipidemia: Previously on statin-he at this time refuses statins due to headache/generalized myalgias-we'll discontinue statins at this time. Patient seems to be fixated on not taking some medications-we will try Zetia or alternative medications in the next few days if patient is agreeable. Note -LDL 135-and not at goal (less than 70)  Cocaine abuse: Counseled extensively-he is aware of life threatening and life disabling effects-including recurrent MI and CVA.  Tobacco abuse: Counseled.  History of schizoaffective disorder: Continue Zoloft, Seroquel. Seen by psychiatry during the early part of his hospital stay-given ongoing issues with refusal of medications/CT head/blood work-uncooperative behavior-large stroke requiring CIR/rehabilitation (will need patient cooperation for rehabilitation)-have consulted psychiatry to assess for capacity and to manage/change medications.   Also-have adjusted medications at his request to see if he'll be more compliant with the rest of his medications. He does not want telemetry-for now we will  Leave him of -unless his clinical situation changes  DVT Prophylaxis: Prophylactic Lovenox   Code Status: Full code  Family Communication: None at bedside  Disposition Plan: Remain inpatient-await psychiatric input-he will need to be more cooperative before he can go to CIR or even to SNF.   Antimicrobial agents: Anti-infectives    None      Procedures: Echo 4/28>> - Left ventricle: The cavity size was normal. There was mild   concentric hypertrophy. Systolic function was normal. The   estimated ejection fraction was in the range of 60% to  65%. Wall   motion was normal; there were no regional wall motion   abnormalities. Doppler parameters are consistent with abnormal   left ventricular relaxation (grade 1 diastolic dysfunction).   There was no evidence of elevated ventricular filling pressure by   Doppler parameters. - Aortic valve: There was trivial regurgitation. - Aortic root: The aortic root was normal in size. - Mitral valve: Structurally normal valve. There was no   regurgitation. - Right ventricle: The cavity size was normal. Wall thickness was   normal. Systolic function was normal. - Right atrium: The atrium was normal in size. - Tricuspid valve: There was trivial regurgitation. - Pulmonary arteries: Systolic pressure was within the normal   range. - Inferior vena cava: The vessel was normal in size. - Pericardium, extracardiac: There was no pericardial effusion.  CONSULTS:  pulmonary/intensive care and neurology  Time spent: 25- minutes-Greater than 50% of this time was spent in counseling, explanation of diagnosis, planning of further management, and coordination of care.  MEDICATIONS: Scheduled Meds: . amLODipine  10 mg Oral Daily  . aspirin  325 mg Oral Daily  . Chlorhexidine Gluconate Cloth  6 each Topical Daily  . cloNIDine  0.2 mg Transdermal Weekly  . enoxaparin (LOVENOX) injection  40 mg Subcutaneous Q24H  . folic acid  1 mg Oral Daily  . haloperidol lactate  5 mg Intravenous Q6H  . multivitamin with minerals  1 tablet Oral Daily  . pantoprazole  40 mg Oral Q1200  . QUEtiapine  300 mg Oral QHS  . sertraline  50 mg Oral Daily  . thiamine  100 mg Oral Daily   Or  . thiamine  100 mg Intravenous Daily   Continuous Infusions:  PRN Meds:.acetaminophen **OR** [DISCONTINUED] acetaminophen (TYLENOL) oral liquid 160 mg/5 mL **OR** [DISCONTINUED] acetaminophen, butalbital-acetaminophen-caffeine, gi cocktail, hydrALAZINE, HYDROcodone-acetaminophen, hydrOXYzine, midazolam, ondansetron (ZOFRAN) IV,  senna-docusate, ziprasidone   PHYSICAL EXAM: Vital signs: Vitals:  03/16/17 2100 03/16/17 2254 03/17/17 0010 03/17/17 0200  BP: 140/88 (!) 174/93    Pulse: (!) 112 (!) 112    Resp: (!) 26 (!) 27    Temp:  100 F (37.8 C) (!) 103.1 F (39.5 C) 99.5 F (37.5 C)  TempSrc:  Oral  Oral  SpO2: 95% 95%    Weight:      Height:       Filed Weights   03/08/17 1728 03/09/17 0230  Weight: 87.1 kg (192 lb) 85.9 kg (189 lb 6 oz)   Body mass index is 28.79 kg/m.   General appearance :Awake, alert, not in any distress.  Eyes:, pupils equally reactive to light and accomodation HEENT: Atraumatic and Normocephalic Neck: supple, no JVD. Resp:Good air entry bilaterally CVS: S1 S2 regular, no murmurs.  GI: Bowel sounds present, Non tender and not distended with no gaurding, rigidity or rebound. Extremities: B/L Lower Ext shows no edema, both legs are warm to touch Neurology:  speech clear,Non focal Psychiatric: Normal judgment and insight. Normal mood. Musculoskeletal:No digital cyanosis Skin:No Rash, warm and dry Wounds:N/A  I have personally reviewed following labs and imaging studies  LABORATORY DATA: CBC:  Recent Labs Lab 03/12/17 0500 03/13/17 0422 03/14/17 1220 03/15/17 0622 03/16/17 0214  WBC 10.0 7.6 8.7 8.9 11.5*  NEUTROABS  --  4.5 6.1 5.3 7.3  HGB 12.2* 12.3* 13.2 11.9* 12.5*  HCT 37.7* 36.7* 38.7* 35.0* 37.5*  MCV 83.0 83.0 82.5 81.6 81.7  PLT 189 192 195 190 466    Basic Metabolic Panel:  Recent Labs Lab 03/11/17 0855 03/11/17 1215  03/12/17 0500  03/13/17 0045 03/13/17 0422  03/14/17 0535 03/14/17 1105 03/14/17 1835 03/15/17 0025 03/15/17 0622 03/16/17 0214  NA 140 140  < > 146*  < > 143 142  < > 141 138 138 138 138  --   K 7.2* >7.5*  --  3.1*  --  3.2*  --   --   --   --   --   --  3.3*  --   CL 117* 116*  --  121*  --  116*  --   --   --   --   --   --  110  --   CO2 18* 18*  --  19*  --  19*  --   --   --   --   --   --  22  --   GLUCOSE  112* 115*  --  105*  --  98  --   --   --   --   --   --  104*  --   BUN 11 10  --  12  --  7  --   --   --   --   --   --  16  --   CREATININE 1.36* 1.40*  --  1.44*  --  1.25*  --   --   --   --   --   --  1.72* 1.72*  CALCIUM 8.5* 8.5*  --  8.5*  --  8.4*  --   --   --   --   --   --  8.6*  --   MG  --   --   --   --   --   --  1.7  --  2.1  --   --   --  2.0 2.0  PHOS  --   --   --   --   --   --  1.6*  --  2.9  --   --   --  4.1 3.8  < > = values in this interval not displayed.  GFR: Estimated Creatinine Clearance: 49.3 mL/min (A) (by C-G formula based on SCr of 1.72 mg/dL (H)).  Liver Function Tests: No results for input(s): AST, ALT, ALKPHOS, BILITOT, PROT, ALBUMIN in the last 168 hours. No results for input(s): LIPASE, AMYLASE in the last 168 hours. No results for input(s): AMMONIA in the last 168 hours.  Coagulation Profile: No results for input(s): INR, PROTIME in the last 168 hours.  Cardiac Enzymes:  Recent Labs Lab 03/16/17 1851  TROPONINI 0.04*    BNP (last 3 results) No results for input(s): PROBNP in the last 8760 hours.  HbA1C: No results for input(s): HGBA1C in the last 72 hours.  CBG: No results for input(s): GLUCAP in the last 168 hours.  Lipid Profile: No results for input(s): CHOL, HDL, LDLCALC, TRIG, CHOLHDL, LDLDIRECT in the last 72 hours.  Thyroid Function Tests: No results for input(s): TSH, T4TOTAL, FREET4, T3FREE, THYROIDAB in the last 72 hours.  Anemia Panel: No results for input(s): VITAMINB12, FOLATE, FERRITIN, TIBC, IRON, RETICCTPCT in the last 72 hours.  Urine analysis:    Component Value Date/Time   COLORURINE YELLOW 10/13/2016 [place] 10/13/2016 0857   LABSPEC 1.016 10/13/2016 0857   PHURINE 6.5 10/13/2016 0857   GLUCOSEU 100 (A) 10/13/2016 0857   HGBUR SMALL (A) 10/13/2016 0857   BILIRUBINUR NEGATIVE 10/13/2016 0857   KETONESUR NEGATIVE 10/13/2016 0857   PROTEINUR 30 (A) 10/13/2016 0857   UROBILINOGEN 0.2  09/06/2014 2224   NITRITE NEGATIVE 10/13/2016 0857   LEUKOCYTESUR NEGATIVE 10/13/2016 0857    Sepsis Labs: Lactic Acid, Venous    Component Value Date/Time   LATICACIDVEN 0.97 01/30/2016 1738    MICROBIOLOGY: Recent Results (from the past 240 hour(s))  MRSA PCR Screening     Status: None   Collection Time: 03/09/17  2:34 AM  Result Value Ref Range Status   MRSA by PCR NEGATIVE NEGATIVE Final    Comment:        The GeneXpert MRSA Assay (FDA approved for NASAL specimens only), is one component of a comprehensive MRSA colonization surveillance program. It is not intended to diagnose MRSA infection nor to guide or monitor treatment for MRSA infections.     RADIOLOGY STUDIES/RESULTS: Ct Angio Head W Or Wo Contrast  Result Date: 03/11/2017 CLINICAL DATA:  Follow-up CVA EXAM: CT ANGIOGRAPHY HEAD AND NECK TECHNIQUE: Multidetector CT imaging of the head and neck was performed using the standard protocol during bolus administration of intravenous contrast. Multiplanar CT image reconstructions and MIPs were obtained to evaluate the vascular anatomy. Carotid stenosis measurements (when applicable) are obtained utilizing NASCET criteria, using the distal internal carotid diameter as the denominator. CONTRAST:  50 mL Isovue 370 COMPARISON:  Head CT 03/10/2017 FINDINGS: CTA NECK FINDINGS Aortic arch: There is no aneurysm or dissection of the visualized ascending aorta or aortic arch. There is a normal 3 vessel branching pattern. The visualized proximal subclavian arteries are normal. Right carotid system: The right common carotid origin is widely patent. There is no common carotid or internal carotid artery dissection or aneurysm. No hemodynamically significant stenosis. Left carotid system: The left common carotid origin is widely patent. There is no common carotid or internal carotid artery dissection or aneurysm. No hemodynamically significant stenosis. Vertebral arteries: The vertebral system  is codominant. Both vertebral artery origins are normal. Both vertebral arteries are  normal to their confluence with the basilar artery. Skeleton: There is no bony spinal canal stenosis. No lytic or blastic lesions. Other neck: The nasopharynx is clear. The oropharynx and hypopharynx are normal. The epiglottis is normal. The supraglottic larynx, glottis and subglottic larynx are normal. No retropharyngeal collection. The parapharyngeal spaces are preserved. The parotid and submandibular glands are normal. No sialolithiasis or salivary ductal dilatation. The thyroid gland is normal. There is no cervical lymphadenopathy. Upper chest: No pneumothorax or pleural effusion. No nodules or masses. Review of the MIP images confirms the above findings CTA HEAD FINDINGS Anterior circulation: --Intracranial internal carotid arteries: Normal. --Anterior cerebral arteries: Normal. --Middle cerebral arteries: Normal. --Posterior communicating arteries: Absent bilaterally. Posterior circulation: --Posterior cerebral arteries: Normal. --Superior cerebellar arteries: Normal. --Basilar artery: Normal. --Anterior inferior cerebellar arteries: Normal on the right. Not clearly seen on the left. --Posterior inferior cerebellar arteries: Normal on the right but not visualized on the left. Venous sinuses: As permitted by contrast timing, patent. Anatomic variants: None Delayed phase: No parenchymal contrast enhancement. Area of cytotoxic edema in the left PICA distribution is unchanged in size, with persistent narrowing of the fourth ventricle and basal cisterns. The sizes of the lateral ventricle temporal horns are unchanged. No hemorrhage. Unchanged crowding of the foramen magnum. Review of the MIP images confirms the above findings IMPRESSION: 1. 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 2. Left AICA is not clearly visualized, but the AICA origin and caliber are highly variable and it is often  not identified on scans of normal patients. 3. Unchanged left cerebellar cytotoxic edema with mass effect on the fourth ventricle and basal cisterns. Crowding of the foramen magnum is also unchanged. 4. No hemorrhage or hydrocephalus. Electronically Signed   By: Ulyses Jarred M.D.   On: 03/11/2017 06:34   Dg Chest 2 View  Result Date: 03/09/2017 CLINICAL DATA:  Headache, dizziness for today, nausea and vomiting EXAM: CHEST  2 VIEW COMPARISON:  Chest x-ray of 10/13/2016 FINDINGS: No active infiltrate or effusion is seen. Minimal volume loss is noted at the right lung base. Mediastinal and hilar contours are unremarkable. The heart is borderline enlarged and stable. No bony abnormality is seen. IMPRESSION: No active lung disease. Minimal volume loss at the right lung base. Stable borderline cardiomegaly. Electronically Signed   By: Ivar Drape M.D.   On: 03/09/2017 11:11   Ct Head Wo Contrast  Result Date: 03/13/2017 CLINICAL DATA:  Follow-up stroke. EXAM: CT HEAD WITHOUT CONTRAST TECHNIQUE: Contiguous axial images were obtained from the base of the skull through the vertex without intravenous contrast. COMPARISON:  MRI of the head March 08, 2017 and CT HEAD March 10, 2017 FINDINGS: BRAIN: Evolving LEFT cerebellar infarct with ex cerebellar folia effacement. Similar mass effect on the fourth ventricle with mild hydrocephalus. No new infarcts. No hemorrhagic conversion. Mildly effaced super of sellar cistern is similar. Patchy supratentorial white matter hypodensities unchanged. Old small LEFT occipital lobe infarcts. VASCULAR: Trace calcific atherosclerosis of the carotid siphons. SKULL: No skull fracture. Old bilateral nasal bone fractures. Small RIGHT frontal scalp lipoma. No significant scalp soft tissue swelling. SINUSES/ORBITS: Trace RIGHT mastoid effusion. Old LEFT medial orbital blowout fracture. OTHER: None. IMPRESSION: Evolving LEFT posterior-inferior cerebellar artery territory infarct nearly  effacing the fourth ventricle resulting in stable mild hydrocephalus. Moderate chronic small vessel ischemic disease and old small LEFT occipital lobe infarct. Electronically Signed   By: Elon Alas M.D.   On: 03/13/2017 04:51  Ct Head Wo Contrast  Addendum Date: 03/10/2017   ADDENDUM REPORT: 03/10/2017 10:32 ADDENDUM: Study discussed by telephone with Dr. Rosalin Hawking on 03/10/2017 at 1025 hours. Electronically Signed   By: Genevie Ann M.D.   On: 03/10/2017 10:32   Result Date: 03/10/2017 CLINICAL DATA:  61 year old male with large left PICA infarct. Recent headache and nausea. EXAM: CT HEAD WITHOUT CONTRAST TECHNIQUE: Contiguous axial images were obtained from the base of the skull through the vertex without intravenous contrast. COMPARISON:  Head CT without contrast 03/09/2017. Brain MRI 03/08/2017, and earlier FINDINGS: Brain: Continued confluent cytotoxic edema throughout the left cerebellum. Mass effect on the fourth ventricle appears mildly progressed, and the temporal horns are mildly larger than yesterday. See series 3, image 9). Other posterior fossa mass effect has not significantly changed. Basilar cisterns are stable. No transependymal edema is evident. No supratentorial mass effect or midline shift. No acute intracranial hemorrhage identified. Stable gray-white matter differentiation throughout the brain. Vascular: No suspicious intracranial vascular hyperdensity. Skull: Chronic left lamina papyracea fracture. No acute osseous abnormality identified. Sinuses/Orbits: Visualized paranasal sinuses and mastoids are stable and well pneumatized. Other: No acute orbit or scalp soft tissue findings. IMPRESSION: 1. Continued moderate to severe left cerebellar edema with interval mildly increased mass effect on the fourth ventricle and early lateral ventriculomegaly - mild enlargement of the temporal horn since yesterday but no transependymal edema is evident. 2. No hemorrhagic transformation or new  intracranial abnormality. Electronically Signed: By: Genevie Ann M.D. On: 03/10/2017 10:22   Ct Head Wo Contrast  Result Date: 03/09/2017 CLINICAL DATA:  Headache and nausea.  No trauma. EXAM: CT HEAD WITHOUT CONTRAST TECHNIQUE: Contiguous axial images were obtained from the base of the skull through the vertex without intravenous contrast. COMPARISON:  MR brain 03/08/2017. FINDINGS: Brain: Extensive cytotoxic edema in the cerebellum, associated with a LEFT PICA territory infarct. 3 mm LEFT-to-RIGHT shift of the fourth ventricle is increased. No hemorrhagic transformation. No hydrocephalus. No other areas of infarction are not clearly visible. Vascular: No proximal large vessel occlusion. Hyperdensity of the LEFT PICA correlates with lack of flow on MR. Skull: Normal. Negative for fracture or focal lesion. Sinuses/Orbits: No acute finding. Other: None. IMPRESSION: Extensive cytotoxic edema in the cerebellum associated with the documented LEFT PICA territory infarct. 3 mm LEFT-to-RIGHT shift of the fourth ventricle is increased from yesterday's MR. Close surveillance recommended for symptomatic tonsillar herniation, future hemorrhagic transformation, and/or development of hydrocephalus. These results will be called to the ordering clinician or representative by the Radiologist Assistant, and communication documented in the PACS or zVision Dashboard. Electronically Signed   By: Staci Righter M.D.   On: 03/09/2017 11:03   Ct Angio Neck W Or Wo Contrast  Result Date: 03/11/2017 CLINICAL DATA:  Follow-up CVA EXAM: CT ANGIOGRAPHY HEAD AND NECK TECHNIQUE: Multidetector CT imaging of the head and neck was performed using the standard protocol during bolus administration of intravenous contrast. Multiplanar CT image reconstructions and MIPs were obtained to evaluate the vascular anatomy. Carotid stenosis measurements (when applicable) are obtained utilizing NASCET criteria, using the distal internal carotid diameter as  the denominator. CONTRAST:  50 mL Isovue 370 COMPARISON:  Head CT 03/10/2017 FINDINGS: CTA NECK FINDINGS Aortic arch: There is no aneurysm or dissection of the visualized ascending aorta or aortic arch. There is a normal 3 vessel branching pattern. The visualized proximal subclavian arteries are normal. Right carotid system: The right common carotid origin is widely patent. There is no common carotid or internal  carotid artery dissection or aneurysm. No hemodynamically significant stenosis. Left carotid system: The left common carotid origin is widely patent. There is no common carotid or internal carotid artery dissection or aneurysm. No hemodynamically significant stenosis. Vertebral arteries: The vertebral system is codominant. Both vertebral artery origins are normal. Both vertebral arteries are normal to their confluence with the basilar artery. Skeleton: There is no bony spinal canal stenosis. No lytic or blastic lesions. Other neck: The nasopharynx is clear. The oropharynx and hypopharynx are normal. The epiglottis is normal. The supraglottic larynx, glottis and subglottic larynx are normal. No retropharyngeal collection. The parapharyngeal spaces are preserved. The parotid and submandibular glands are normal. No sialolithiasis or salivary ductal dilatation. The thyroid gland is normal. There is no cervical lymphadenopathy. Upper chest: No pneumothorax or pleural effusion. No nodules or masses. Review of the MIP images confirms the above findings CTA HEAD FINDINGS Anterior circulation: --Intracranial internal carotid arteries: Normal. --Anterior cerebral arteries: Normal. --Middle cerebral arteries: Normal. --Posterior communicating arteries: Absent bilaterally. Posterior circulation: --Posterior cerebral arteries: Normal. --Superior cerebellar arteries: Normal. --Basilar artery: Normal. --Anterior inferior cerebellar arteries: Normal on the right. Not clearly seen on the left. --Posterior inferior cerebellar  arteries: Normal on the right but not visualized on the left. Venous sinuses: As permitted by contrast timing, patent. Anatomic variants: None Delayed phase: No parenchymal contrast enhancement. Area of cytotoxic edema in the left PICA distribution is unchanged in size, with persistent narrowing of the fourth ventricle and basal cisterns. The sizes of the lateral ventricle temporal horns are unchanged. No hemorrhage. Unchanged crowding of the foramen magnum. Review of the MIP images confirms the above findings IMPRESSION: 1. 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 2. Left AICA is not clearly visualized, but the AICA origin and caliber are highly variable and it is often not identified on scans of normal patients. 3. Unchanged left cerebellar cytotoxic edema with mass effect on the fourth ventricle and basal cisterns. Crowding of the foramen magnum is also unchanged. 4. No hemorrhage or hydrocephalus. Electronically Signed   By: Ulyses Jarred M.D.   On: 03/11/2017 06:34   [name]  Result Date: 03/08/2017 CLINICAL DATA:  Vertigo EXAM: MRI HEAD WITHOUT CONTRAST TECHNIQUE: Multiplanar, multiecho pulse sequences of the brain and surrounding structures were obtained without intravenous contrast. COMPARISON:  Head CT 08/27/2016 FINDINGS: The examination had to be discontinued prior to completion due to patient refusal to continue. Brain: There is a large area of diffusion restriction within the left cerebellar hemisphere, in the PICA territory, with associated cytotoxic edema and rightward displacement of the midline posterior fossa structures. There is also a small focus of diffusion restriction within the left occipital lobe. No acute hemorrhage. The basal cisterns, cerebral aqueduct and fourth ventricle remain patent. No hydrocephalus or extra-axial fluid collection. The midline structures are normal. No age advanced or lobar predominant atrophy. There is  multifocal hyperintense T2-weighted signal within the periventricular white matter, most often seen in the setting of chronic microvascular ischemia. Vascular: Major intracranial arterial and venous sinus flow voids are preserved. There are scattered foci of chronic microhemorrhage within both cerebral hemispheres, the brainstem and cerebellum. The distribution is nonspecific. Skull and upper cervical spine: The visualized skull base, calvarium, upper cervical spine and extracranial soft tissues are normal. Sinuses/Orbits: No fluid levels or advanced mucosal thickening. No mastoid effusion. Normal orbits. IMPRESSION: 1. Truncated examination due to patient refusal to complete all sequences. 2. Large acute infarct of  the left PICA territory with associated cytotoxic edema and rightward displacement of the midline cerebellar structures. The basal cisterns and fourth ventricle remain patent without obstructive hydrocephalus. No acute hemorrhage. 3. Small left occipital acute infarct. 4. Chronic microvascular ischemia and scattered punctate foci of chronic microhemorrhage. These results will be called to the ordering clinician or representative by the Radiologist Assistant, and communication documented in the PACS or zVision Dashboard. Electronically Signed   By: Ulyses Jarred M.D.   On: 03/08/2017 23:25   Ir Ivc Filter Plmt / S&i /img Guid/mod Sed  Result Date: 03/10/2017 CLINICAL DATA:  Lower extremity DVT. 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 Elevated creatinine. EXAM: INFERIOR VENACAVOGRAM IVC FILTER PLACEMENT UNDER FLUOROSCOPY FLUOROSCOPY TIME:  1 minutes 24 seconds, 49 mGy TECHNIQUE: The procedure, risks (including but not limited to bleeding, infection, organ damage ), benefits, and alternatives were explained to the patient. Questions regarding the procedure were encouraged and answered. The patient understands and consents to the procedure. Patency of the right IJ vein was confirmed  with ultrasound with image documentation. An appropriate skin site was determined. Skin site was marked, prepped with chlorhexidine, and draped using maximum barrier technique. The region was infiltrated locally with 1% lidocaine. Intravenous Fentanyl and Versed were administered as conscious sedation during continuous monitoring of the patient's level of consciousness and physiological / cardiorespiratory status by the radiology RN, with a total moderate sedation time of 11 minutes. Under real-time ultrasound guidance, the right IJ vein was accessed with a 21 gauge micropuncture needle; the needle tip within the vein was confirmed with ultrasound image documentation. The needle was exchanged over a 018 guidewire for a transitional dilator, which allow advancement of the long 6 [place] vascular sheath placed for CO2 inferior venacavography. This demonstrated no caval thrombus. Renal vein inflows were evident, corresponding to findings on previous abdomen CT. The [facility] IVC filter was advanced through the sheath and successfully deployed under fluoroscopy at the L3 level. Followup cavagram demonstrates stable filter position and no evident complication. The sheath was removed and hemostasis achieved at the site. No immediate complication. IMPRESSION: 1. Normal IVC. No thrombus or significant anatomic variation. 2. Technically successful infrarenal IVC filter placement. This is a retrievable model. PLAN: This IVC filter is potentially retrievable. The patient will be assessed for filter retrieval by Interventional Radiology in approximately 8-12 weeks. Further recommendations regarding filter retrieval, continued surveillance or declaration of device permanence, will be made at that time. Electronically Signed   By: Lucrezia Europe M.D.   On: 03/10/2017 12:33   Dg Chest Port 1 View  Result Date: 03/10/2017 CLINICAL DATA:  Central venous catheter placement. EXAM: PORTABLE CHEST 1 VIEW COMPARISON:  03/09/2017 and prior  radiographs FINDINGS: Upper limits normal heart size, mediastinal fullness and elevation of the right hemidiaphragm are unchanged. A left IJ central venous catheter is noted with tip overlying the upper SVC. There is no evidence of focal airspace disease, pulmonary edema, suspicious pulmonary nodule/mass, pleural effusion, or pneumothorax. No acute bony abnormalities are identified. IMPRESSION: Left IJ central venous catheter with tip overlying the upper SVC. No evidence of pneumothorax. Electronically Signed   By: Margarette Canada M.D.   On: 03/10/2017 12:45     LOS: 8 days   Oren Binet, MD  [facility] Pager:[phone]  If 7PM-7AM, please contact night-coverage [email or web address] Password [email or web address] 03/17/2017, 8:13 AM
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
## 2017-03-17 NOTE — Progress Notes (Signed)
Pt refused am labs.

## 2017-03-17 NOTE — Progress Notes (Signed)
CRITICAL VALUE ALERT  Critical value received:  Troponin 0.04  Date of notification:  02 may 18  Time of notification:  2100  Critical value read back:Yes.    Nurse who received alert:  Corie Chiquito  MD notified (1st page):  Tylene Fantasia  Time of first page:  NP on floor  MD notified (2nd page):  Time of second page:  Responding MD: Tylene Fantasia  Time MD responded:  NP on the floor

## 2017-03-17 NOTE — Progress Notes (Signed)
OT Cancellation Note  Patient Details Name: LORANZO DESHA MRN: [id number] DOB: [birth year]   Cancelled Treatment:    Reason Eval/Treat Not Completed: Other (comment). In to see pt for therapy to work on basic ADLs (showering). Pt reports he is trying to rest, when prompted further he repeated I told you I am trying to rest. Will try back at a later time as schedule allows.  Almon Register [phone] 03/17/2017, 10:30 AM

## 2017-03-17 NOTE — Progress Notes (Addendum)
  Speech Language Pathology Treatment: Cognitive-Linquistic  Patient Details Name: Darryl Parsons MRN: [id number] DOB: 1956/03/28 Today's Date: 03/17/2017 Time: 7414-2395 SLP Time Calculation (min) (ACUTE ONLY): 8 min  Assessment / Plan / Recommendation Clinical Impression  Pt reluctant and defensive re: therapy stating "I'm cognizant." Required max prompts cues re: awareness of deficits, goals for going home and barriers to home and independence. Easily distracted and tangential. SLP provided compensatory strategies for memory. Will continue to work with while in hospital.   HPI HPI: 61 year old male admitted 03/08/17. PMH significant for CXR revealed no infiltrate. MRI revealed large Left PCA and small left occipital infarcts.      SLP Plan  Continue with current plan of care       Recommendations                   Oral Care Recommendations: Oral care BID SLP Visit Diagnosis: Cognitive communication deficit (V20.233) Plan: Continue with current plan of care       GO                Darryl Parsons 03/17/2017, 2:53 PM  Darryl Parsons.[employer] [phone]

## 2017-03-17 NOTE — Clinical Social Work Note (Addendum)
PASARR under level II review.  Dayton Scrape, [place] [phone]  12:00 pm Received call from [facility]. She will come by either later this afternoon or tomorrow to see patient for Level II review.  Dayton Scrape, [place]

## 2017-03-18 DIAGNOSIS — F6089 Other specific personality disorders: Secondary | ICD-10-CM

## 2017-03-18 LAB — URINE CULTURE

## 2017-03-18 MED ORDER — HYDROCODONE-ACETAMINOPHEN 10-325 MG PO TABS
1.0000 | ORAL_TABLET | ORAL | Status: DC | PRN
  Administered 2017-03-18: 1 via ORAL
  Administered 2017-03-18: 0.5 via ORAL
  Administered 2017-03-20: 1 via ORAL
  Filled 2017-03-18 (×3): qty 1

## 2017-03-18 MED ORDER — DEXTROSE 5 % IV SOLN
1.0000 g | INTRAVENOUS | Status: DC
  Administered 2017-03-19: 1 g via INTRAVENOUS
  Filled 2017-03-18 (×2): qty 10

## 2017-03-18 NOTE — Progress Notes (Signed)
Inpatient Rehabilitation  I have left a note in patient's room requesting his significant other, [name] call myself or my co-worker Gerlean Ren to discuss therapy options.  Plan for my co-worker Gerlean Ren to follow up Monday.    Carmelia Roller., CCC/SLP Admission Coordinator  [place]  Cell [phone]

## 2017-03-18 NOTE — Clinical Social Work Note (Signed)
IVC paperwork signed, notarized, and faxed to magistrate. Paperwork will expire on 5/11.  Patient transferred from 4E to 5W. Handoff information given to 5W CSW.   This CSW signing off.  Darryl Parsons, [place]

## 2017-03-18 NOTE — Progress Notes (Signed)
Patient refuses assessment at this time.  Pt. Continues to be agitated and aggressive.  Sitter at bedside.

## 2017-03-18 NOTE — Progress Notes (Signed)
PT Cancellation Note  Patient Details Name: Darryl Parsons MRN: [id number] DOB: February 01, 1956   Cancelled Treatment:    Reason Eval/Treat Not Completed: Patient declined, no reason specified; patient seated EOB with sitter at doorway.  Reports he has had a lot of physical activity recently and does not want to participate in PT right now.  Attempted to encourage participation, but pt continued to refuse.  Will attempt later as time permits.   Reginia Naas 03/18/2017, 9:26 AM  Magda Kiel, [place] 03/18/2017
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
## 2017-03-18 NOTE — Progress Notes (Signed)
Patient called out asking for a Vicodin.  He was informed by RN that it was still several hours before it can be administered again.  Patient was dissatisfied with this and refused the Tylenol offered stating "it makes me gag". He requested aspirin, "the white kind, not the orange kind".  RN paged MD, MD responded that patient is already on aspirin daily and therefore it cannot be ordered PRN.  Patient understood this but still was unsatisfied.  Provided with heat packs as he stated earlier in the shift that they helped.  Will continue to monitor.

## 2017-03-18 NOTE — Progress Notes (Signed)
Patient refusing labs.

## 2017-03-18 NOTE — Progress Notes (Signed)
PROGRESS NOTE        PATIENT DETAILS Name: ROLAND LIPKE Age: 61 y.o. Sex: male Date of Birth: Sep 02, 1956 Admit Date: 03/08/2017 Admitting Physician Kerney Elbe, MD TXM:IWOEHOZY NOT IN SYSTEM  Brief Narrative: Patient is a 60 y.o. 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 Admitted to the intensive care unit, and required 3% normal saline infusion, hospital course was subsequently complicated by development of severe delirium. Upon stability, and transferred to the [facility] hospitalist service on 03/16/17. See below for further details  Subjective: Uncooperative-aggressive this morning. "Rambling"-does not make sense.  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 Echocardiogram without any obvious embolic source. CT angiogram of the neck with no significant stenosis. LDL 135. Difficult situation-continues to refuse telemetry monitoring, multiple medications-he has a long-standing history of polysubstance abuse/underlying psychiatric comorbidities-due to these reasons-and likely noncompliance-noted candidate to pursue further workup including TEE-as it will likely not change management. He is much more aggressive and very belligerent and this morning-he is rambling about a lot of things and does not make sense-I do not think he will be able to participate in rehabilitation in this condition. Psychiatry was consulted on 5/3-deemed not to have capacity. Difficult situation, continue with aspirin for now-if patient cooperates, he does not want statins due to headaches and myalgias. Disposition issues remain open given psychiatric issues/aggressive  behavior.  Cerebellar edema/hydrocephalus: Likely secondary to above, completed 3% normal saline therapy. Neurosurgery and neurology followed closely. Claims he does not have headache this morning-CT of the head done on 5/3 (personally reviewed) reassuring with decreasing edema.   Fever: Occurred on 5/3-no obvious source of infection on exam-refuses exam today-difficult situation-UA positive for UTI-culture data pending. Will start IV Rocephin if he is agreeable. Follow culture data.  Aggressive behavior/delirium/schizoaffective disorder: Continues to refuse multiple medications-he is not making any sense at all this morning. He is worsening-very aggressive towards nursing staff-uncooperative/aggressive to this M.D. as well ("go back to [place] or somewhere near there"). Have ordered a bedside sitter. Subsequently spoke with Dr. Louretta Shorten today-given patient's aggressive behavior/uncooperativeness/refusal to take medications-he advises that we use Geodon IM as needed. I have asked the RN staff to get in touch with the social worker-to see if we can get this patient under IVC-psychiatry agrees with this as well. We will see if we can get an touch with some family members-nitrite calling the number listed in the face sheet-no one answered the phone.  Right lower extremity DVT: Given large CVA and risk of hemorrhagic conversion-not a candidate for anticoagulation. Furthermore given issues with cocaine/noncompliance/his underlying psychiatric issues-probably not a great long-term candidate for DVT. Underwent IVC filter placement on 4/26.  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 When he is  more cooperative-will try to optimize.  Dyslipidemia: Previously on statin-but he does not want to take it anymore due to myalgias and headaches. Will try  to resume statins when he is more cooperative. Note -LDL 135-and not at goal (less than 70)  Cocaine abuse: Counseled extensively a few days back when he was much more awake and alert-he was aware of life threatening and life disabling effects.  Tobacco abuse: Counseled a few days back when he was.  History of schizoaffective disorder: Unfortunately refuses multiple medications-if he is cooperative-would continue with Zoloft and Seroquel. Reevaluated by psychiatry yesterday-recommendations are to continue with current medications-see above regarding discussion with psychiatry   DVT Prophylaxis: Prophylactic Lovenox   Code Status: Full code  Family Communication: None at bedside-attempted to call next of kin number in the facesheet-no response  Disposition Plan: Remain inpatient-transfer to med-surg unit. Will likely need social work eval to determine appropriate dispositon  Antimicrobial agents: Anti-infectives    Start     Dose/Rate Route Frequency Ordered Stop   03/18/17 0800  cefTRIAXone (ROCEPHIN) 1 g in dextrose 5 % 50 mL IVPB     1 g 100 mL/hr over 30 Minutes Intravenous Every 24 hours 03/18/17 0731        Procedures: Echo 4/28>> - Left ventricle: The cavity size was normal. There was mild   concentric hypertrophy. Systolic function was normal. The   estimated ejection fraction was in the range of 60% to 65%. Wall   motion was normal; there were no regional wall motion   abnormalities. Doppler parameters are consistent with abnormal   left ventricular relaxation (grade 1 diastolic dysfunction).   There was no evidence of elevated ventricular filling pressure by   Doppler parameters. - Aortic valve: There was trivial regurgitation. - Aortic root: The aortic root was normal in size. - Mitral valve: Structurally normal  valve. There was no   regurgitation. - Right ventricle: The cavity size was normal. Wall thickness was   normal. Systolic function was normal. - Right atrium: The atrium was normal in size. - Tricuspid valve: There was trivial regurgitation. - Pulmonary arteries: Systolic pressure was within the normal   range. - Inferior vena cava: The vessel was normal in size. - Pericardium, extracardiac: There was no pericardial effusion.  CONSULTS:  pulmonary/intensive care and neurology  Time spent: 45- minutes-Greater than 50% of this time was spent in counseling,  planning of further management, and coordination of care.  MEDICATIONS: Scheduled Meds: . amLODipine  10 mg Oral Daily  . aspirin  325 mg Oral Daily  . Chlorhexidine Gluconate Cloth  6 each Topical Daily  . cloNIDine  0.2 mg Transdermal Weekly  . enoxaparin (LOVENOX) injection  40 mg Subcutaneous Q24H  . folic acid  1 mg Oral Daily  . hydrochlorothiazide  12.5 mg Oral Daily  . multivitamin with minerals  1 tablet Oral Daily  . pantoprazole  40 mg Oral Q1200  . QUEtiapine  300 mg Oral QHS  . sertraline  50 mg Oral Daily  . thiamine  100 mg Oral Daily   Or  . thiamine  100 mg Intravenous Daily   Continuous Infusions: . cefTRIAXone (ROCEPHIN)  IV     PRN Meds:.acetaminophen **OR** [DISCONTINUED] acetaminophen (TYLENOL) oral liquid 160 mg/5 mL **OR** [DISCONTINUED] acetaminophen, butalbital-acetaminophen-caffeine, gi cocktail, hydrALAZINE, HYDROcodone-acetaminophen, ondansetron (ZOFRAN) IV, senna-docusate, ziprasidone   PHYSICAL EXAM: Vital signs: Vitals:   03/17/17 1600 03/17/17 2000 03/17/17 2245 03/18/17 0000  BP: (!) 141/99 (!) 142/82 (!) 141/91 (!) 147/93  Pulse: 94     Resp: 18 16  16   Temp: 98.4 F (36.9 C)  99.9 F (37.7 C)  TempSrc: Oral  Oral   SpO2: 97% 96%    Weight:      Height:       Filed Weights   03/08/17 1728 03/09/17 0230  Weight: 87.1 kg (192 lb) 85.9 kg (189 lb 6 oz)   Body mass index is  28.79 kg/m.   General appearance :Awake-But very agitated-aggressive-rambling-not making any sense. Not in any distress. Somewhat unsteady gait Refuses to let me examine him this morning But on general exam-he does not appear to have any new focal deficits.  I have personally reviewed following labs and imaging studies  LABORATORY DATA: CBC:  Recent Labs Lab 03/12/17 0500 03/13/17 0422 03/14/17 1220 03/15/17 0622 03/16/17 0214  WBC 10.0 7.6 8.7 8.9 11.5*  NEUTROABS  --  4.5 6.1 5.3 7.3  HGB 12.2* 12.3* 13.2 11.9* 12.5*  HCT 37.7* 36.7* 38.7* 35.0* 37.5*  MCV 83.0 83.0 82.5 81.6 81.7  PLT 189 192 195 190 211    Basic Metabolic Panel:  Recent Labs Lab 03/11/17 1215  03/12/17 0500  03/13/17 0045 03/13/17 0422  03/14/17 0535 03/14/17 1105 03/14/17 1835 03/15/17 0025 03/15/17 0622 03/16/17 0214 03/17/17 0824  NA 140  < > 146*  < > 143 142  < > 141 138 138 138 138  --  135  K >7.5*  --  3.1*  --  3.2*  --   --   --   --   --   --  3.3*  --  5.0  CL 116*  --  121*  --  116*  --   --   --   --   --   --  110  --  107  CO2 18*  --  19*  --  19*  --   --   --   --   --   --  22  --  16*  GLUCOSE 115*  --  105*  --  98  --   --   --   --   --   --  104*  --  107*  BUN 10  --  12  --  7  --   --   --   --   --   --  16  --  19  CREATININE 1.40*  --  1.44*  --  1.25*  --   --   --   --   --   --  1.72* 1.72* 1.73*  CALCIUM 8.5*  --  8.5*  --  8.4*  --   --   --   --   --   --  8.6*  --  8.9  MG  --   --   --   --   --  1.7  --  2.1  --   --   --  2.0 2.0  --   PHOS  --   --   --   --   --  1.6*  --  2.9  --   --   --  4.1 3.8  --   < > = values in this interval not displayed.  GFR: Estimated Creatinine Clearance: 49 mL/min (A) (by C-G formula based on SCr of 1.73 mg/dL (H)).  Liver Function Tests: No results for input(s): AST, ALT, ALKPHOS, BILITOT, PROT, ALBUMIN in the last 168 hours. No results for input(s): LIPASE, AMYLASE in the last 168 hours. No results for  input(s): AMMONIA in the last 168 hours.  Coagulation Profile: No results for input(s): INR, PROTIME in the last 168 hours.  Cardiac Enzymes:  Recent Labs Lab 03/16/17 1851 03/17/17 0824 03/17/17 1029  CKTOTAL  --  188  --   TROPONINI 0.04*  --  0.05*    BNP (last 3 results) No results for input(s): PROBNP in the last 8760 hours.  HbA1C: No results for input(s): HGBA1C in the last 72 hours.  CBG: No results for input(s): GLUCAP in the last 168 hours.  Lipid Profile: No results for input(s): CHOL, HDL, LDLCALC, TRIG, CHOLHDL, LDLDIRECT in the last 72 hours.  Thyroid Function Tests: No results for input(s): TSH, T4TOTAL, FREET4, T3FREE, THYROIDAB in the last 72 hours.  Anemia Panel: No results for input(s): VITAMINB12, FOLATE, FERRITIN, TIBC, IRON, RETICCTPCT in the last 72 hours.  Urine analysis:    Component Value Date/Time   COLORURINE YELLOW 03/17/2017 1200   APPEARANCEUR TURBID (A) 03/17/2017 1200   LABSPEC 1.023 03/17/2017 1200   PHURINE 6.0 03/17/2017 1200   GLUCOSEU NEGATIVE 03/17/2017 1200   HGBUR SMALL (A) 03/17/2017 1200   BILIRUBINUR NEGATIVE 03/17/2017 1200   KETONESUR NEGATIVE 03/17/2017 1200   PROTEINUR 100 (A) 03/17/2017 1200   UROBILINOGEN 0.2 09/06/2014 2224   NITRITE NEGATIVE 03/17/2017 1200   LEUKOCYTESUR LARGE (A) 03/17/2017 1200    Sepsis Labs: Lactic Acid, Venous    Component Value Date/Time   LATICACIDVEN 0.97 01/30/2016 1738    MICROBIOLOGY: Recent Results (from the past 240 hour(s))  MRSA PCR Screening     Status: None   Collection Time: 03/09/17  2:34 AM  Result Value Ref Range Status   MRSA by PCR NEGATIVE NEGATIVE Final    Comment:        The GeneXpert MRSA Assay (FDA approved for NASAL specimens only), is one component of a comprehensive MRSA colonization surveillance program. It is not intended to diagnose MRSA infection nor to guide or monitor treatment for MRSA infections.     RADIOLOGY STUDIES/RESULTS: Ct  Angio Head W Or Wo Contrast  Result Date: 03/11/2017 CLINICAL DATA:  Follow-up CVA EXAM: CT ANGIOGRAPHY HEAD AND NECK TECHNIQUE: Multidetector CT imaging of the head and neck was performed using the standard protocol during bolus administration of intravenous contrast. Multiplanar CT image reconstructions and MIPs were obtained to evaluate the vascular anatomy. Carotid stenosis measurements (when applicable) are obtained utilizing NASCET criteria, using the distal internal carotid diameter as the denominator. CONTRAST:  50 mL Isovue 370 COMPARISON:  Head CT 03/10/2017 FINDINGS: CTA NECK FINDINGS Aortic arch: There is no aneurysm or dissection of the visualized ascending aorta or aortic arch. There is a normal 3 vessel branching pattern. The visualized proximal subclavian arteries are normal. Right carotid system: The right common carotid origin is widely patent. There is no common carotid or internal carotid artery dissection or aneurysm. No hemodynamically significant stenosis. Left carotid system: The left common carotid origin is widely patent. There is no common carotid or internal carotid artery dissection or aneurysm. No hemodynamically significant stenosis. Vertebral arteries: The vertebral system is codominant. Both vertebral artery origins are normal. Both vertebral arteries are normal to their confluence with the basilar artery. Skeleton: There is no bony spinal canal stenosis. No lytic or blastic lesions. Other neck: The nasopharynx is clear. The oropharynx and hypopharynx are normal. The epiglottis is normal. The supraglottic larynx, glottis and subglottic larynx are normal. No retropharyngeal collection. The parapharyngeal spaces are preserved. The parotid and submandibular glands are normal. No sialolithiasis or salivary ductal dilatation. The  thyroid gland is normal. There is no cervical lymphadenopathy. Upper chest: No pneumothorax or pleural effusion. No nodules or masses. Review of the MIP images  confirms the above findings CTA HEAD FINDINGS Anterior circulation: --Intracranial internal carotid arteries: Normal. --Anterior cerebral arteries: Normal. --Middle cerebral arteries: Normal. --Posterior communicating arteries: Absent bilaterally. Posterior circulation: --Posterior cerebral arteries: Normal. --Superior cerebellar arteries: Normal. --Basilar artery: Normal. --Anterior inferior cerebellar arteries: Normal on the right. Not clearly seen on the left. --Posterior inferior cerebellar arteries: Normal on the right but not visualized on the left. Venous sinuses: As permitted by contrast timing, patent. Anatomic variants: None Delayed phase: No parenchymal contrast enhancement. Area of cytotoxic edema in the left PICA distribution is unchanged in size, with persistent narrowing of the fourth ventricle and basal cisterns. The sizes of the lateral ventricle temporal horns are unchanged. No hemorrhage. Unchanged crowding of the foramen magnum. Review of the MIP images confirms the above findings IMPRESSION: 1. 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 2. Left AICA is not clearly visualized, but the AICA origin and caliber are highly variable and it is often not identified on scans of normal patients. 3. Unchanged left cerebellar cytotoxic edema with mass effect on the fourth ventricle and basal cisterns. Crowding of the foramen magnum is also unchanged. 4. No hemorrhage or hydrocephalus. Electronically Signed   By: Ulyses Jarred M.D.   On: 03/11/2017 06:34   Dg Chest 2 View  Result Date: 03/09/2017 CLINICAL DATA:  Headache, dizziness for today, nausea and vomiting EXAM: CHEST  2 VIEW COMPARISON:  Chest x-ray of 10/13/2016 FINDINGS: No active infiltrate or effusion is seen. Minimal volume loss is noted at the right lung base. Mediastinal and hilar contours are unremarkable. The heart is borderline enlarged and stable. No bony abnormality is seen. IMPRESSION: No  active lung disease. Minimal volume loss at the right lung base. Stable borderline cardiomegaly. Electronically Signed   By: Ivar Drape M.D.   On: 03/09/2017 11:11   Ct Head Wo Contrast  Result Date: 03/17/2017 CLINICAL DATA:  61 year old male status post confluent left cerebellar infarct with cerebellar edema, mild ventriculomegaly. EXAM: CT HEAD WITHOUT CONTRAST TECHNIQUE: Contiguous axial images were obtained from the base of the skull through the vertex without intravenous contrast. COMPARISON:  03/13/2017 and earlier. FINDINGS: Brain: Confluent left cerebellar cytotoxic edema persists, although patency of the fourth ventricle has mildly improved since 03/13/2017, and both temporal horns are somewhat smaller today (series 3, image 11 today versus series 3, image 11 previously - and coronal image 37 today versus coronal image 35 previously). Other basilar cistern patency might also be mildly improved. No acute intracranial hemorrhage identified. No supratentorial mass effect. Stable gray-white matter differentiation elsewhere. No new cortically based infarct. Vascular: No suspicious intracranial vascular hyperdensity. Skull: Chronic left lamina papyracea fracture. No acute osseous abnormality identified. Sinuses/Orbits: Visualized paranasal sinuses and mastoids are stable and well pneumatized. Other: No acute orbit or scalp soft tissue findings. IMPRESSION: 1. Mildly improved; mild regression of left cerebellar edema and mild ventriculomegaly since 03/13/2017, which are related to the confluent left cerebellar infarct. 2. No new intracranial abnormality. Electronically Signed   By: Genevie Ann M.D.   On: 03/17/2017 09:16   Ct Head Wo Contrast  Result Date: 03/13/2017 CLINICAL DATA:  Follow-up stroke. EXAM: CT HEAD WITHOUT CONTRAST TECHNIQUE: Contiguous axial images were obtained from the base of the skull through the vertex without intravenous contrast. COMPARISON:  MRI of the head March 08, 2017 and CT HEAD  March 10, 2017 FINDINGS: BRAIN: Evolving LEFT cerebellar infarct with ex cerebellar folia effacement. Similar mass effect on the fourth ventricle with mild hydrocephalus. No new infarcts. No hemorrhagic conversion. Mildly effaced super of sellar cistern is similar. Patchy supratentorial white matter hypodensities unchanged. Old small LEFT occipital lobe infarcts. VASCULAR: Trace calcific atherosclerosis of the carotid siphons. SKULL: No skull fracture. Old bilateral nasal bone fractures. Small RIGHT frontal scalp lipoma. No significant scalp soft tissue swelling. SINUSES/ORBITS: Trace RIGHT mastoid effusion. Old LEFT medial orbital blowout fracture. OTHER: None. IMPRESSION: Evolving LEFT posterior-inferior cerebellar artery territory infarct nearly effacing the fourth ventricle resulting in stable mild hydrocephalus. Moderate chronic small vessel ischemic disease and old small LEFT occipital lobe infarct. Electronically Signed   By: Elon Alas M.D.   On: 03/13/2017 04:51   Ct Head Wo Contrast  Addendum Date: 03/10/2017   ADDENDUM REPORT: 03/10/2017 10:32 ADDENDUM: Study discussed by telephone with Dr. Rosalin Hawking on 03/10/2017 at 1025 hours. Electronically Signed   By: Genevie Ann M.D.   On: 03/10/2017 10:32   Result Date: 03/10/2017 CLINICAL DATA:  61 year old male with large left PICA infarct. Recent headache and nausea. EXAM: CT HEAD WITHOUT CONTRAST TECHNIQUE: Contiguous axial images were obtained from the base of the skull through the vertex without intravenous contrast. COMPARISON:  Head CT without contrast 03/09/2017. Brain MRI 03/08/2017, and earlier FINDINGS: Brain: Continued confluent cytotoxic edema throughout the left cerebellum. Mass effect on the fourth ventricle appears mildly progressed, and the temporal horns are mildly larger than yesterday. See series 3, image 9). Other posterior fossa mass effect has not significantly changed. Basilar cisterns are stable. No transependymal edema is  evident. No supratentorial mass effect or midline shift. No acute intracranial hemorrhage identified. Stable gray-white matter differentiation throughout the brain. Vascular: No suspicious intracranial vascular hyperdensity. Skull: Chronic left lamina papyracea fracture. No acute osseous abnormality identified. Sinuses/Orbits: Visualized paranasal sinuses and mastoids are stable and well pneumatized. Other: No acute orbit or scalp soft tissue findings. IMPRESSION: 1. Continued moderate to severe left cerebellar edema with interval mildly increased mass effect on the fourth ventricle and early lateral ventriculomegaly - mild enlargement of the temporal horn since yesterday but no transependymal edema is evident. 2. No hemorrhagic transformation or new intracranial abnormality. Electronically Signed: By: Genevie Ann M.D. On: 03/10/2017 10:22   Ct Head Wo Contrast  Result Date: 03/09/2017 CLINICAL DATA:  Headache and nausea.  No trauma. EXAM: CT HEAD WITHOUT CONTRAST TECHNIQUE: Contiguous axial images were obtained from the base of the skull through the vertex without intravenous contrast. COMPARISON:  MR brain 03/08/2017. FINDINGS: Brain: Extensive cytotoxic edema in the cerebellum, associated with a LEFT PICA territory infarct. 3 mm LEFT-to-RIGHT shift of the fourth ventricle is increased. No hemorrhagic transformation. No hydrocephalus. No other areas of infarction are not clearly visible. Vascular: No proximal large vessel occlusion. Hyperdensity of the LEFT PICA correlates with lack of flow on MR. Skull: Normal. Negative for fracture or focal lesion. Sinuses/Orbits: No acute finding. Other: None. IMPRESSION: Extensive cytotoxic edema in the cerebellum associated with the documented LEFT PICA territory infarct. 3 mm LEFT-to-RIGHT shift of the fourth ventricle is increased from yesterday's MR. Close surveillance recommended for symptomatic tonsillar herniation, future hemorrhagic transformation, and/or development  of hydrocephalus. These results will be called to the ordering clinician or representative by the Radiologist Assistant, and communication documented in the PACS or zVision Dashboard. Electronically Signed   By: Roderic Ovens.D.  On: 03/09/2017 11:03   Ct Angio Neck W Or Wo Contrast  Result Date: 03/11/2017 CLINICAL DATA:  Follow-up CVA EXAM: CT ANGIOGRAPHY HEAD AND NECK TECHNIQUE: Multidetector CT imaging of the head and neck was performed using the standard protocol during bolus administration of intravenous contrast. Multiplanar CT image reconstructions and MIPs were obtained to evaluate the vascular anatomy. Carotid stenosis measurements (when applicable) are obtained utilizing NASCET criteria, using the distal internal carotid diameter as the denominator. CONTRAST:  50 mL Isovue 370 COMPARISON:  Head CT 03/10/2017 FINDINGS: CTA NECK FINDINGS Aortic arch: There is no aneurysm or dissection of the visualized ascending aorta or aortic arch. There is a normal 3 vessel branching pattern. The visualized proximal subclavian arteries are normal. Right carotid system: The right common carotid origin is widely patent. There is no common carotid or internal carotid artery dissection or aneurysm. No hemodynamically significant stenosis. Left carotid system: The left common carotid origin is widely patent. There is no common carotid or internal carotid artery dissection or aneurysm. No hemodynamically significant stenosis. Vertebral arteries: The vertebral system is codominant. Both vertebral artery origins are normal. Both vertebral arteries are normal to their confluence with the basilar artery. Skeleton: There is no bony spinal canal stenosis. No lytic or blastic lesions. Other neck: The nasopharynx is clear. The oropharynx and hypopharynx are normal. The epiglottis is normal. The supraglottic larynx, glottis and subglottic larynx are normal. No retropharyngeal collection. The parapharyngeal spaces are preserved.  The parotid and submandibular glands are normal. No sialolithiasis or salivary ductal dilatation. The thyroid gland is normal. There is no cervical lymphadenopathy. Upper chest: No pneumothorax or pleural effusion. No nodules or masses. Review of the MIP images confirms the above findings CTA HEAD FINDINGS Anterior circulation: --Intracranial internal carotid arteries: Normal. --Anterior cerebral arteries: Normal. --Middle cerebral arteries: Normal. --Posterior communicating arteries: Absent bilaterally. Posterior circulation: --Posterior cerebral arteries: Normal. --Superior cerebellar arteries: Normal. --Basilar artery: Normal. --Anterior inferior cerebellar arteries: Normal on the right. Not clearly seen on the left. --Posterior inferior cerebellar arteries: Normal on the right but not visualized on the left. Venous sinuses: As permitted by contrast timing, patent. Anatomic variants: None Delayed phase: No parenchymal contrast enhancement. Area of cytotoxic edema in the left PICA distribution is unchanged in size, with persistent narrowing of the fourth ventricle and basal cisterns. The sizes of the lateral ventricle temporal horns are unchanged. No hemorrhage. Unchanged crowding of the foramen magnum. Review of the MIP images confirms the above findings IMPRESSION: 1. 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 2. Left AICA is not clearly visualized, but the AICA origin and caliber are highly variable and it is often not identified on scans of normal patients. 3. Unchanged left cerebellar cytotoxic edema with mass effect on the fourth ventricle and basal cisterns. Crowding of the foramen magnum is also unchanged. 4. No hemorrhage or hydrocephalus. Electronically Signed   By: Ulyses Jarred M.D.   On: 03/11/2017 06:34   [name]  Result Date: 03/08/2017 CLINICAL DATA:  Vertigo EXAM: MRI HEAD WITHOUT CONTRAST TECHNIQUE: Multiplanar, multiecho pulse  sequences of the brain and surrounding structures were obtained without intravenous contrast. COMPARISON:  Head CT 08/27/2016 FINDINGS: The examination had to be discontinued prior to completion due to patient refusal to continue. Brain: There is a large area of diffusion restriction within the left cerebellar hemisphere, in the PICA territory, with associated cytotoxic edema and rightward displacement of the midline posterior fossa structures. There  is also a small focus of diffusion restriction within the left occipital lobe. No acute hemorrhage. The basal cisterns, cerebral aqueduct and fourth ventricle remain patent. No hydrocephalus or extra-axial fluid collection. The midline structures are normal. No age advanced or lobar predominant atrophy. There is multifocal hyperintense T2-weighted signal within the periventricular white matter, most often seen in the setting of chronic microvascular ischemia. Vascular: Major intracranial arterial and venous sinus flow voids are preserved. There are scattered foci of chronic microhemorrhage within both cerebral hemispheres, the brainstem and cerebellum. The distribution is nonspecific. Skull and upper cervical spine: The visualized skull base, calvarium, upper cervical spine and extracranial soft tissues are normal. Sinuses/Orbits: No fluid levels or advanced mucosal thickening. No mastoid effusion. Normal orbits. IMPRESSION: 1. Truncated examination due to patient refusal to complete all sequences. 2. 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 The basal cisterns and fourth ventricle remain patent without obstructive hydrocephalus. No acute hemorrhage. 3. Small left occipital acute infarct. 4. Chronic microvascular ischemia and scattered punctate foci of chronic microhemorrhage. These results will be called to the ordering clinician or representative by the Radiologist  Assistant, and communication documented in the PACS or zVision Dashboard. Electronically Signed   By: Ulyses Jarred M.D.   On: 03/08/2017 23:25   Ir Ivc Filter Plmt / S&i /img Guid/mod Sed  Result Date: 03/10/2017 CLINICAL DATA:  Lower extremity DVT. 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 Elevated creatinine. EXAM: INFERIOR VENACAVOGRAM IVC FILTER PLACEMENT UNDER FLUOROSCOPY FLUOROSCOPY TIME:  1 minutes 24 seconds, 49 mGy TECHNIQUE: The procedure, risks (including but not limited to bleeding, infection, organ damage ), benefits, and alternatives were explained to the patient. Questions regarding the procedure were encouraged and answered. The patient understands and consents to the procedure. Patency of the right IJ vein was confirmed with ultrasound with image documentation. An appropriate skin site was determined. Skin site was marked, prepped with chlorhexidine, and draped using maximum barrier technique. The region was infiltrated locally with 1% lidocaine. Intravenous Fentanyl and Versed were administered as conscious sedation during continuous monitoring of the patient's level of consciousness and physiological / cardiorespiratory status by the radiology RN, with a total moderate sedation time of 11 minutes. Under real-time ultrasound guidance, the right IJ vein was accessed with a 21 gauge micropuncture needle; the needle tip within the vein was confirmed with ultrasound image documentation. The needle was exchanged over a 018 guidewire for a transitional dilator, which allow advancement of the long 6 [place] vascular sheath placed for CO2 inferior venacavography. This demonstrated no caval thrombus. Renal vein inflows were evident, corresponding to findings on previous abdomen CT. The [facility] IVC filter was advanced through the sheath and successfully deployed under fluoroscopy at the L3 level. Followup cavagram demonstrates stable filter position and no evident complication. The sheath  was removed and hemostasis achieved at the site. No immediate complication. IMPRESSION: 1. Normal IVC. No thrombus or significant anatomic variation. 2. Technically successful infrarenal IVC filter placement. This is a retrievable model. PLAN: This IVC filter is potentially retrievable. The patient will be assessed for filter retrieval by Interventional Radiology in approximately 8-12 weeks. Further recommendations regarding filter retrieval, continued surveillance or declaration of device permanence, will be made at that time. Electronically Signed   By: Lucrezia Europe M.D.   On: 03/10/2017 12:33   Dg Chest Port 1 View  Result Date: 03/10/2017 CLINICAL DATA:  Central venous catheter placement. EXAM: PORTABLE  CHEST 1 VIEW COMPARISON:  03/09/2017 and prior radiographs FINDINGS: Upper limits normal heart size, mediastinal fullness and elevation of the right hemidiaphragm are unchanged. A left IJ central venous catheter is noted with tip overlying the upper SVC. There is no evidence of focal airspace disease, pulmonary edema, suspicious pulmonary nodule/mass, pleural effusion, or pneumothorax. No acute bony abnormalities are identified. IMPRESSION: Left IJ central venous catheter with tip overlying the upper SVC. No evidence of pneumothorax. Electronically Signed   By: Margarette Canada M.D.   On: 03/10/2017 12:45     LOS: 9 days   Oren Binet, MD  [facility] Pager:[phone]  If 7PM-7AM, please contact night-coverage [email or web address] Password [email or web address] 03/18/2017, 8:15 AM

## 2017-03-18 NOTE — Progress Notes (Signed)
Patient refuses medications at this time stating "I don't want nothin."  Patient is physically and verbally aggressive making threats to staff.  Security contacted.  Patient appears to have calmed sitting in bed with bed alarm on at this time.  Will continue to monitor.

## 2017-03-18 NOTE — Progress Notes (Signed)
NURSING PROGRESS NOTE  Darryl Parsons [id number] Transfer Data: 03/18/2017 8:21 PM Attending Provider: Jonetta Osgood, MD ZRA:[id number] NOT IN SYSTEM Code Status: FULL   Darryl Parsons is a 61 y.o. male patient [facility] from [address]  -No acute distress noted.  -No complaints of shortness of breath.  -No complaints of chest pain.    Last Documented Vital Signs: Blood pressure (!) 143/75, pulse 89, temperature 97.9 F (36.6 C), temperature source Oral, resp. rate 16, height 5\' 8"  (1.727 m), weight 85.9 kg (189 lb 6 oz), SpO2 99 %.  Allergies:  Other  Past Medical History:   has a past medical history of Chronic kidney disease; Headache; Hypertension; Low back pain; Mental disorder; and Schizophrenia ([place]).  Past Surgical History:   has a past surgical history that includes IR IVC FILTER PLMT / S&I /IMG GUID/MOD SED (03/10/2017).  Social History:   reports that he has been smoking Cigarettes.  He has been smoking about 0.50 packs per day. He has never used smokeless tobacco. He reports that he drinks alcohol. He reports that he uses drugs, including Cocaine.  Skin: intact except where otherwise charted  Patient/Family orientated to room. Information packet given to patient/family. Admission inpatient armband information verified with patient/family to include name and date of birth and placed on patient arm. Side rails up x 2, fall assessment and education completed with patient/family. Patient/family able to verbalize understanding of risk associated with falls and verbalized understanding to call for assistance before getting out of bed. Call light within reach. Patient/family able to voice and demonstrate understanding of unit orientation instructions.

## 2017-03-18 NOTE — Progress Notes (Signed)
Patient to transfer 5W10 report given to receiving nurse, all questions answered at this time.  Pt. Shows no s/s of distress at this time.

## 2017-03-19 DIAGNOSIS — F1290 Cannabis use, unspecified, uncomplicated: Secondary | ICD-10-CM

## 2017-03-19 DIAGNOSIS — F1420 Cocaine dependence, uncomplicated: Secondary | ICD-10-CM

## 2017-03-19 DIAGNOSIS — F1120 Opioid dependence, uncomplicated: Secondary | ICD-10-CM

## 2017-03-19 DIAGNOSIS — I10 Essential (primary) hypertension: Secondary | ICD-10-CM

## 2017-03-19 MED ORDER — DEXTROSE 5 % IV SOLN
1.0000 g | INTRAVENOUS | Status: DC
  Filled 2017-03-19 (×2): qty 10

## 2017-03-19 NOTE — Progress Notes (Signed)
Patient refused his seroquel tonight, stated, "he is allergic to it and it gives him a headache".  Will continue to monitor. Wilson Singer, RN

## 2017-03-19 NOTE — Progress Notes (Signed)
OT Cancellation Note  Patient Details Name: Darryl Parsons MRN: [id number] DOB: [birth year]   Cancelled Treatment:    Reason Eval/Treat Not Completed: Other (comment).  Pt. adamantly refusing skilled OT for various reasons that were never specified by pt.  Pt. Asking multiple questions of what I was doing in his room, what I was here for but would then say "oh yeah im interrupting you on purpose, you see that, you like that".  Continued to say we were not being forthcoming and helping with therapy and trying to "show off from psychology classes at school"  Demanded I be "straight forward".  I attempted to review specific goals and therapy session ideas (very straight forward) but he continued to interrupt and defer.      Janice Coffin, COTA/L.   03/19/2017, 8:43 AM

## 2017-03-19 NOTE — Progress Notes (Signed)
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 circulation: --Intracranial internal carotid arteries: Normal. --Anterior cerebral arteries: Normal. --Middle cerebral arteries: Normal. --Posterior communicating arteries: Absent bilaterally. Posterior circulation: --Posterior cerebral arteries: Normal. --Superior cerebellar arteries: Normal. --Basilar artery: Normal. --Anterior inferior cerebellar arteries: Normal on the right. Not clearly seen on the left. --Posterior inferior cerebellar arteries: Normal on the right but not visualized on the left. Venous sinuses: As permitted by contrast timing, patent. Anatomic variants: None Delayed phase: No parenchymal contrast enhancement. Area of cytotoxic edema in the left PICA distribution is unchanged in size, with persistent narrowing of the fourth ventricle and basal cisterns. The sizes of the lateral ventricle temporal horns are unchanged. No hemorrhage. Unchanged crowding of the foramen magnum. Review of the MIP images confirms the above findings IMPRESSION: 1. Non-opacification of the left PICA, in keeping with acute left  PICA territory infarct. 2. Left AICA is not clearly visualized, but the AICA origin and caliber are highly variable and it is often not identified on scans of normal patients. 3. Unchanged left cerebellar cytotoxic edema with mass effect on the fourth ventricle and basal cisterns. Crowding of the foramen magnum is also unchanged. 4. No hemorrhage or hydrocephalus. Electronically Signed   By: Ulyses Jarred M.D.   On: 03/11/2017 06:34   [name]  Result Date: 03/08/2017 CLINICAL DATA:  Vertigo EXAM: MRI HEAD WITHOUT CONTRAST TECHNIQUE: Multiplanar, multiecho pulse sequences of the brain and surrounding structures were obtained without intravenous contrast. COMPARISON:  Head CT 08/27/2016 FINDINGS:  The examination had to be discontinued prior to completion due to patient refusal to continue. Brain: There is a large area of diffusion restriction within the left cerebellar hemisphere, in the PICA territory, with associated cytotoxic edema and rightward displacement of the midline posterior fossa structures. There is also a small focus of diffusion restriction within the left occipital lobe. No acute hemorrhage. The basal cisterns, cerebral aqueduct and fourth ventricle remain patent. No hydrocephalus or extra-axial fluid collection. The midline structures are normal. No age advanced or lobar predominant atrophy. There is multifocal hyperintense T2-weighted signal within the periventricular white matter, most often seen in the setting of chronic microvascular ischemia. Vascular: Major intracranial arterial and venous sinus flow voids are preserved. There are scattered foci of chronic microhemorrhage within both cerebral hemispheres, the brainstem and cerebellum. The distribution is nonspecific. Skull and upper cervical spine: The visualized skull base, calvarium, upper cervical spine and extracranial soft tissues are normal. Sinuses/Orbits: No fluid levels or advanced mucosal thickening. No mastoid  effusion. Normal orbits. IMPRESSION: 1. Truncated examination due to patient refusal to complete all sequences. 2. 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 The basal cisterns and fourth ventricle remain patent without obstructive hydrocephalus. No acute hemorrhage. 3. Small left occipital acute infarct. 4. Chronic microvascular ischemia and scattered punctate foci of chronic microhemorrhage. These results will be called to the ordering clinician or representative by the Radiologist Assistant, and communication documented in the PACS or zVision Dashboard. Electronically Signed   By: Ulyses Jarred M.D.   On: 03/08/2017 23:25   Ir Ivc Filter Plmt / S&i /img Guid/mod Sed  Result Date: 03/10/2017 CLINICAL DATA:  Lower extremity DVT. 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 Elevated creatinine. EXAM: INFERIOR VENACAVOGRAM IVC FILTER PLACEMENT UNDER FLUOROSCOPY FLUOROSCOPY TIME:  1 minutes 24 seconds, 49 mGy TECHNIQUE: The procedure, risks (including but not limited to bleeding, infection, organ damage ), benefits, and alternatives were explained to the patient. Questions regarding the procedure were encouraged and answered. The patient understands and consents to the procedure. Patency of the right IJ vein was confirmed with ultrasound with image documentation. An appropriate skin site was determined. Skin site was marked, prepped with chlorhexidine, and draped using maximum barrier technique. The region was infiltrated locally with 1% lidocaine. Intravenous Fentanyl and Versed were administered as conscious sedation during continuous monitoring of the patient's level of consciousness and physiological / cardiorespiratory status by the radiology RN, with a total moderate sedation time of 11 minutes. Under real-time ultrasound guidance, the right IJ vein was accessed with a 21 gauge micropuncture  needle; the needle tip within the vein was confirmed with ultrasound image documentation. The needle was exchanged over a 018 guidewire for a transitional dilator, which allow advancement of the long 6 [place] vascular sheath placed for CO2 inferior venacavography. This demonstrated no caval thrombus. Renal vein inflows were evident, corresponding to findings on previous abdomen CT. The [facility] IVC filter was advanced through the sheath and successfully deployed under fluoroscopy at the L3 level. Followup cavagram demonstrates stable filter position and no evident complication. The sheath was removed and hemostasis achieved at the site. No immediate complication. IMPRESSION: 1. Normal IVC. No thrombus or significant anatomic variation. 2. Technically successful infrarenal IVC filter placement. This is a retrievable model. PLAN: This IVC filter is potentially retrievable. The patient will be assessed for filter retrieval by Interventional Radiology in approximately 8-12 weeks. Further recommendations regarding filter retrieval,  continued surveillance or declaration of device permanence, will be made at that time. Electronically Signed   By: Lucrezia Europe M.D.   On: 03/10/2017 12:33   Dg Chest Port 1 View  Result Date: 03/10/2017 CLINICAL DATA:  Central venous catheter placement. EXAM: PORTABLE CHEST 1 VIEW COMPARISON:  03/09/2017 and prior radiographs FINDINGS: Upper limits normal heart size, mediastinal fullness and elevation of the right hemidiaphragm are unchanged. A left IJ central venous catheter is noted with tip overlying the upper SVC. There is no evidence of focal airspace disease, pulmonary edema, suspicious pulmonary nodule/mass, pleural effusion, or pneumothorax. No acute bony abnormalities are identified. IMPRESSION: Left IJ central venous catheter with tip overlying the upper SVC. No evidence of pneumothorax. Electronically Signed   By: Margarette Canada M.D.   On: 03/10/2017 12:45     LOS: 10 days    Oren Binet, MD  [facility] Pager:[phone]  If 7PM-7AM, please contact night-coverage [email or web address] Password [email or web address] 03/19/2017, 10:51 AM

## 2017-03-19 NOTE — Progress Notes (Signed)
PHARMACY - PHYSICIAN COMMUNICATION CRITICAL VALUE ALERT - BLOOD CULTURE IDENTIFICATION (BCID)  No results found for this or any previous visit.  Name of physician (or Provider) Contacted: Dr. Sloan Leiter  Changes to prescribed antibiotics required: Patient with gram positive rods in 1 of 2, currently on Rocephin. BCID did not detect any targets, likely contaminant. No changes to therapy at this time.  Dimitri Ped, PharmD, BCPS PGY-2 Infectious Diseases Pharmacy Resident Pager: [phone] 03/19/2017  7:09 AM

## 2017-03-19 NOTE — Consult Note (Addendum)
[facility] Consult   Reason for Consult:  Aggression, pulled knife at staff Referring Physician:  Dr. Sloan Leiter  Patient Identification: Darryl Parsons MRN:  [id number] Principal Diagnosis: Schizoaffective disorder, depressive type [facility]) Diagnosis:   Patient Active Problem List   Diagnosis Date Noted  . Cocaine abuse [F14.10] 08/21/2012    Priority: High  . Vertigo [R42]   . DVT (deep vein thrombosis) in pregnancy ([place]) [O22.30, I82.409]   . Central venous catheter in place [Z78.9]   . 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 Stroke (cerebrum) ([place]) [I63.9] 03/09/2017  . Hypovitaminosis [E56.9]   . Tobacco use disorder [F17.200]   . Overdose [T50.901A] 10/06/2016  . Schizoaffective disorder, depressive type ([place]) [F25.1] 08/27/2016  . Cocaine use disorder, mild, abuse [F14.10] 08/27/2016  . Essential hypertension [I10] 08/27/2016  . Chronic headache [R51] 08/27/2016  . Suicidal ideation [R45.851] 02/03/2016  . Encephalopathy acute [G93.40] 01/30/2016  . CKD (chronic kidney disease) stage 4, GFR 15-29 ml/min (HCC) [N18.4] 01/30/2016  . Benign essential HTN [I10] 01/30/2016  . Hypokalemia [E87.6] 08/21/2012    Total Time spent with patient: 45 minutes  Subjective:   '' I did not pull knife at the staff, I will never hurt myself or anyone else.''  HPI:  Patient who reports history of Schizoaffective disorder, cocaine use disorder and admitted due to stroke. He denies pulling knife at the staff, however, he verbalizes being a little depressed due to having stroke. He states that he was upset yesterday after he found out that his sister was also admitted to another hospital with stroke. Today, he is calm, cooperative, pleasant and denies psychosis, delusional thinking, SI/HI but still feel a little depressed. He admits to smoking cigarettes and Crack cocaine  occasionally. He does his psychiatric outpatient follow up at [facility] in [place].  Past Psychiatric History: Schizoaffective disorder and cocaine use disorder, last admission to [facility] is 08/26/2017.  Risk to Self: Is patient at risk for suicide?: No Risk to Others:   Prior Inpatient Therapy:   Prior Outpatient Therapy:    Past Medical History:  Past Medical History:  Diagnosis Date  . Chronic kidney disease   . Headache   . Hypertension   . Low back pain   . Mental disorder   . Schizophrenia [facility])     Past Surgical History:  Procedure Laterality Date  . IR IVC FILTER PLMT / S&I /IMG GUID/MOD SED  03/10/2017   Family History:  Family History  Problem Relation Age of Onset  . Cardiomyopathy Sister   . Lupus Sister   . Kidney failure Sister   . Factor VIII deficiency Neg Hx   . Mental illness Neg Hx    Family Psychiatric  History: Noncontributory Social History:  History  Alcohol Use  . Yes    Comment: 1-2 beers a month     History  Drug Use  . Types: Cocaine    Social History   Social History  . Marital status: Single    Spouse name: N/A  . Number of children: N/A  . Years of education: N/A   Social History Main Topics  . Smoking status: Current Every Day Smoker    Packs/day: 0.50    Types: Cigarettes  . Smokeless tobacco: Never Used  . Alcohol use Yes     Comment: 1-2 beers a month  . Drug use: Yes    Types: Cocaine  .  Sexual activity: No   Other Topics Concern  . None   Social History Narrative  . None   Additional Social History:    Allergies:   Allergies  Allergen Reactions  . Other     Pt states he is allergic to a blood pressure pill. He is unable to state reaction. He is spelling it adelatt and aderlatt.    Labs:  No results found for this or any previous visit (from the past 48 hour(s)).  Current Facility-Administered Medications  Medication Dose Route Frequency Provider Last Rate Last Dose  . acetaminophen (TYLENOL) tablet  1,000 mg  1,000 mg Oral Q8H PRN Jonetta Osgood, MD   1,000 mg at 03/17/17 0003  . amLODipine (NORVASC) tablet 10 mg  10 mg Oral Daily Rinehuls, David L, PA-C   10 mg at 03/19/17 1013  . aspirin tablet 325 mg  325 mg Oral Daily Jonetta Osgood, MD   325 mg at 03/19/17 1012  . butalbital-acetaminophen-caffeine (FIORICET, ESGIC) 50-325-40 MG per tablet 2 tablet  2 tablet Oral Q8H PRN Rosalin Hawking, MD   2 tablet at 03/16/17 1743  . cefTRIAXone (ROCEPHIN) 1 g in dextrose 5 % 50 mL IVPB  1 g Intravenous Q24H Ghimire, Henreitta Leber, MD      . Chlorhexidine Gluconate Cloth 2 % PADS 6 each  6 each Topical Daily Rosalin Hawking, MD   6 each at 03/17/17 [phone]  . cloNIDine (CATAPRES - Dosed in mg/24 hr) patch 0.2 mg  0.2 mg Transdermal Weekly Rush Farmer, MD   0.2 mg at 03/15/17 1355  . enoxaparin (LOVENOX) injection 40 mg  40 mg Subcutaneous Q24H Rosalin Hawking, MD   40 mg at 03/16/17 1329  . folic acid (FOLVITE) tablet 1 mg  1 mg Oral Daily Alvira Philips, [place]   1 mg at 03/19/17 1013  . gi cocktail (Maalox,Lidocaine,Donnatal)  30 mL Oral TID PRN Jonetta Osgood, MD      . hydrALAZINE (APRESOLINE) injection 10-20 mg  10-20 mg Intravenous Q2H PRN Rosalin Hawking, MD   10 mg at 03/17/17 0003  . hydrochlorothiazide (MICROZIDE) capsule 12.5 mg  12.5 mg Oral Daily Jonetta Osgood, MD   12.5 mg at 03/19/17 1013  . HYDROcodone-acetaminophen (NORCO) 10-325 MG per tablet 1 tablet  1 tablet Oral Q4H PRN Jonetta Osgood, MD   0.5 tablet at 03/18/17 2317  . multivitamin with minerals tablet 1 tablet  1 tablet Oral Daily Rosalin Hawking, MD   1 tablet at 03/16/17 1308  . ondansetron (ZOFRAN) injection 4 mg  4 mg Intravenous Q6H PRN Rosalin Hawking, MD   4 mg at 03/18/17 0354  . pantoprazole (PROTONIX) EC tablet 40 mg  40 mg Oral Q1200 Jonetta Osgood, MD   40 mg at 03/17/17 1328  . QUEtiapine (SEROQUEL) tablet 300 mg  300 mg Oral QHS Rosalin Hawking, MD   300 mg at 03/18/17 2200  . senna-docusate (Senokot-S) tablet 1 tablet   1 tablet Oral QHS PRN Kerney Elbe, MD      . sertraline (ZOLOFT) tablet 50 mg  50 mg Oral Daily Kerney Elbe, MD   50 mg at 03/19/17 1012  . thiamine (VITAMIN B-1) tablet 100 mg  100 mg Oral Daily Rosalin Hawking, MD   100 mg at 03/19/17 1013   Or  . thiamine (B-1) injection 100 mg  100 mg Intravenous Daily Rosalin Hawking, MD   100 mg at 03/13/17 1041  . ziprasidone (GEODON) injection 10  mg  10 mg Intramuscular Q2H PRN Ambrose Finland, MD   10 mg at 03/18/17 1537    Musculoskeletal: Strength & Muscle Tone: decreased Gait & Station: unable to stand Patient leans: N/A  Psychiatric Specialty Exam: Physical Exam  Psychiatric: His speech is normal and behavior is normal. Judgment and thought content normal. Cognition and memory are normal. He exhibits a depressed mood.   as per history and physical   ROSNo Fever-chills, No Headache, No changes with Vision or hearing, reports vertigo No problems swallowing food or Liquids, No Chest pain, Cough or Shortness of Breath, No Abdominal pain, No Nausea or Vommitting, Bowel movements are regular, No Blood in stool or Urine, No dysuria, No new skin rashes or bruises, No new joints pains-aches,  No new weakness, tingling, numbness in any extremity, No recent weight gain or loss, No polyuria, polydypsia or polyphagia,  A full 10 point Review of Systems was done, except as stated above, all other Review of Systems were negative.   Blood pressure (!) 145/94, pulse 87, temperature 98 F (36.7 C), temperature source Oral, resp. rate 18, height 5\' 8"  (1.727 m), weight 85.9 kg (189 lb 6 oz), SpO2 96 %.Body mass index is 28.79 kg/m.  General Appearance: Casual  Eye Contact:  Good  Speech:  Clear and Coherent  Volume:  Normal  Mood:  Dysphoric  Affect:  Constricted  Thought Process:  Coherent  Orientation:  Full (Time, Place, and Person)  Thought Content:  Logical  Suicidal Thoughts:  No  Homicidal Thoughts:  No  Memory:  Immediate;    Fair Recent;   Fair Remote;   Fair  Judgement:  Other:  marginal  Insight:  Shallow  Psychomotor Activity:  Decreased  Concentration:  Concentration: Fair and Attention Span: Fair  Recall:  [employer] of Knowledge:  Fair  Language:  Good  Akathisia:  Negative  Handed:  Right  AIMS (if indicated):     Assets:  Communication Skills Desire for Improvement Financial Resources/Insurance Leisure Time Resilience  ADL's:  Impaired  Cognition:  WNL  Sleep:        Treatment Plan Summary:  Meziah Blasingame is a 61 years old male with history of schizoaffective disorder,cocaine abuse and intoxication presented with posterior circulation stroke.   Diagnosis: Schizoaffective disorder most recent episode psychosis Polysubstance dependence including cocaine and opiates  Plan/Recommendation: Continue Seroquel 300 mg daily for schizoaffective disorder Continue Zoloft 50 mg daily for depression Continue Geodon 20 mg IM as needed for agitation and aggressive behaviors   Appreciate psychiatric consultation and follow up as clinically required Please contact [phone] or [phone] if needs further assistance  Disposition: Supportive therapy provided about ongoing stressors.       Unit social worker to assist with referring patient to [facility] for counseling and medication management upon discharge.  Corena Pilgrim, MD 03/19/2017 2:03 PM

## 2017-03-19 NOTE — Progress Notes (Signed)
Per Pt bath has been done by self.

## 2017-03-20 LAB — BASIC METABOLIC PANEL
Anion gap: 12 (ref 5–15)
BUN: 27 mg/dL — AB (ref 6–20)
CHLORIDE: 102 mmol/L (ref 101–111)
CO2: 20 mmol/L — ABNORMAL LOW (ref 22–32)
CREATININE: 1.67 mg/dL — AB (ref 0.61–1.24)
Calcium: 9.1 mg/dL (ref 8.9–10.3)
GFR calc Af Amer: 50 mL/min — ABNORMAL LOW (ref >60)
GFR, EST NON AFRICAN AMERICAN: 43 mL/min — AB (ref >60)
Glucose, Bld: 146 mg/dL — ABNORMAL HIGH (ref 65–99)
Potassium: 3.6 mmol/L (ref 3.5–5.1)
SODIUM: 134 mmol/L — AB (ref 135–145)

## 2017-03-20 LAB — CBC
HCT: 35.5 % — ABNORMAL LOW (ref 39.0–52.0)
Hemoglobin: 11.8 g/dL — ABNORMAL LOW (ref 13.0–17.0)
MCH: 27.1 pg (ref 26.0–34.0)
MCHC: 33.2 g/dL (ref 30.0–36.0)
MCV: 81.4 fL (ref 78.0–100.0)
PLATELETS: 354 10*3/uL (ref 150–400)
RBC: 4.36 MIL/uL (ref 4.22–5.81)
RDW: 14.8 % (ref 11.5–15.5)
WBC: 12.7 10*3/uL — AB (ref 4.0–10.5)

## 2017-03-20 MED ORDER — AMLODIPINE BESYLATE 10 MG PO TABS: 10.0000 mg | ORAL_TABLET | Freq: Every day | ORAL | 0 refills | Status: DC

## 2017-03-20 MED ORDER — HYDROCHLOROTHIAZIDE 12.5 MG PO CAPS: 12.5000 mg | ORAL_CAPSULE | Freq: Every day | ORAL | 0 refills | Status: DC

## 2017-03-20 MED ORDER — ATORVASTATIN CALCIUM 40 MG PO TABS: 40.0000 mg | ORAL_TABLET | Freq: Every day | ORAL | 0 refills | Status: DC

## 2017-03-20 MED ORDER — ASPIRIN 325 MG PO TABS
325.0000 mg | ORAL_TABLET | Freq: Every day | ORAL | 0 refills | Status: DC
Start: 2017-03-21 — End: 2021-06-01

## 2017-03-20 MED ORDER — CLONIDINE HCL 0.2 MG/24HR TD PTWK: 0.2000 mg | MEDICATED_PATCH | TRANSDERMAL | 0 refills | Status: DC

## 2017-03-20 NOTE — Care Management Note (Signed)
Case Management Note  Patient Details  Name: Darryl Parsons MRN: [id number] Date of Birth: 03-04-1956  Subjective/Objective: 61 y.o. Admitted with  Large acute Infarct. PT has recommended HHPT and Cane for home use. CM notified [facility], per pts choice who will arrange for DME as well as HHPT.   Notified Bedside RN.              Action/Plan:CM will sign off for now but will be available should additional discharge needs arise or disposition change.    Expected Discharge Date:                  Expected Discharge Plan:  [place]  In-House Referral:  NA  Discharge planning Services  CM Consult  Post Acute Care Choice:  Durable Medical Equipment, Home Health Choice offered to:  Patient  DME Arranged:  Kasandra Knudsen DME Agency:  [facility] [place] Arranged:  PT [facility] Agency:  [place]  Status of Service:  Completed, signed off  If discussed at [place] of Stay Meetings, dates discussed:    Additional Comments:  Delrae Sawyers, RN 03/20/2017, 1:01 PM

## 2017-03-20 NOTE — Progress Notes (Signed)
Markedly better today!! Calm, quiet-very cooperative-per RN-he took all of his psychiatric medications. He walked down the hallway without any assistance, slightly unsteady gait-thinks he needs a cane. Spoke with psychiatrist M.D.-Dr Darleene Cleaver over the phone-who had evaluated the patient yesterday, per psych M.D.-patient now has the capacity since he has markedly improved, he advises that we discontinue IVC.  Patient does not want CIR or SNF, he wants to go home-he is agreeable to see what outpatient resources he can qualify for in terms of therapy services. He wants a cane which I will order.  He is stable for discharge later today.

## 2017-03-20 NOTE — Discharge Summary (Signed)
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 variable and it is often not identified on scans of normal patients. 3. Unchanged left cerebellar cytotoxic edema with mass effect on the fourth ventricle and basal cisterns. Crowding of the foramen magnum is also unchanged. 4. No hemorrhage or hydrocephalus. Electronically Signed   By: Ulyses Jarred M.D.   On: 03/11/2017 06:34   [name]  Result Date: 03/08/2017 CLINICAL DATA:  Vertigo EXAM: MRI HEAD WITHOUT CONTRAST TECHNIQUE: Multiplanar, multiecho pulse sequences of the brain and surrounding structures were obtained without intravenous contrast. COMPARISON:  Head CT 08/27/2016 FINDINGS: The examination had to be discontinued prior to completion due to patient refusal to continue. Brain: There is a large area of diffusion restriction within the left cerebellar hemisphere, in the PICA territory, with associated cytotoxic edema and rightward displacement of the midline posterior fossa structures. There is also a small focus of diffusion restriction within the left occipital lobe. No acute hemorrhage. The basal cisterns, cerebral aqueduct and fourth ventricle remain patent. No hydrocephalus  or extra-axial fluid collection. The midline structures are normal. No age advanced or lobar predominant atrophy. There is multifocal hyperintense T2-weighted signal within the periventricular white matter, most often seen in the setting of chronic microvascular ischemia. Vascular: Major intracranial arterial and venous sinus flow voids are preserved. There are scattered foci of chronic microhemorrhage within both cerebral hemispheres, the brainstem and cerebellum. The distribution is nonspecific. Skull and upper cervical spine: The visualized skull base, calvarium, upper cervical spine and extracranial soft tissues are normal. Sinuses/Orbits: No fluid levels or advanced mucosal thickening. No mastoid effusion. Normal orbits. IMPRESSION: 1. Truncated examination due to patient refusal to complete all sequences. 2. 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 The basal cisterns and fourth ventricle remain patent without obstructive hydrocephalus. No acute hemorrhage. 3. Small left occipital acute infarct. 4. Chronic microvascular ischemia and scattered punctate foci of chronic microhemorrhage. These results will be called to the ordering clinician or representative by the Radiologist Assistant, and communication documented in the PACS or zVision Dashboard. Electronically Signed   By: Ulyses Jarred M.D.   On: 03/08/2017 23:25   Ir Ivc Filter Plmt / S&i /img Guid/mod Sed  Result Date: 03/10/2017 CLINICAL DATA:  Lower extremity DVT. 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 Elevated creatinine. EXAM: INFERIOR VENACAVOGRAM IVC FILTER PLACEMENT UNDER FLUOROSCOPY FLUOROSCOPY TIME:  1 minutes 24 seconds, 49 mGy TECHNIQUE: The procedure, risks (including but not limited to bleeding, infection, organ damage ), benefits, and alternatives were explained to the patient. Questions regarding the procedure were  encouraged and answered. The patient understands and consents to the procedure. Patency of the right IJ vein was  confirmed with ultrasound with image documentation. An appropriate skin site was determined. Skin site was marked, prepped with chlorhexidine, and draped using maximum barrier technique. The region was infiltrated locally with 1% lidocaine. Intravenous Fentanyl and Versed were administered as conscious sedation during continuous monitoring of the patient's level of consciousness and physiological / cardiorespiratory status by the radiology RN, with a total moderate sedation time of 11 minutes. Under real-time ultrasound guidance, the right IJ vein was accessed with a 21 gauge micropuncture needle; the needle tip within the vein was confirmed with ultrasound image documentation. The needle was exchanged over a 018 guidewire for a transitional dilator, which allow advancement of the long 6 [place] vascular sheath placed for CO2 inferior venacavography. This demonstrated no caval thrombus. Renal vein inflows were evident, corresponding to findings on previous abdomen CT. The [facility] IVC filter was advanced through the sheath and successfully deployed under fluoroscopy at the L3 level. Followup cavagram demonstrates stable filter position and no evident complication. The sheath was removed and hemostasis achieved at the site. No immediate complication. IMPRESSION: 1. Normal IVC. No thrombus or significant anatomic variation. 2. Technically successful infrarenal IVC filter placement. This is a retrievable model. PLAN: This IVC filter is potentially retrievable. The patient will be assessed for filter retrieval by Interventional Radiology in approximately 8-12 weeks. Further recommendations regarding filter retrieval, continued surveillance or declaration of device permanence, will be made at that time. Electronically Signed   By: Lucrezia Europe M.D.   On: 03/10/2017 12:33   Dg Chest Port 1 View  Result Date:  03/10/2017 CLINICAL DATA:  Central venous catheter placement. EXAM: PORTABLE CHEST 1 VIEW COMPARISON:  03/09/2017 and prior radiographs FINDINGS: Upper limits normal heart size, mediastinal fullness and elevation of the right hemidiaphragm are unchanged. A left IJ central venous catheter is noted with tip overlying the upper SVC. There is no evidence of focal airspace disease, pulmonary edema, suspicious pulmonary nodule/mass, pleural effusion, or pneumothorax. No acute bony abnormalities are identified. IMPRESSION: Left IJ central venous catheter with tip overlying the upper SVC. No evidence of pneumothorax. Electronically Signed   By: Margarette Canada M.D.   On: 03/10/2017 12:45     TODAY-DAY OF DISCHARGE:  Subjective:   Darryl Parsons today has no headache,no chest abdominal pain,no new weakness tingling or numbness, feels much better wants to go home today.   Objective:   Blood pressure (!) 164/85, pulse 81, temperature 98.6 F (37 C), resp. rate 18, height 5\' 8"  (1.727 m), weight 85.9 kg (189 lb 6 oz), SpO2 98 %.  Intake/Output Summary (Last 24 hours) at 03/20/17 1133 Last data filed at 03/19/17 1357  Gross per 24 hour  Intake              240 ml  Output                0 ml  Net              240 ml   Filed Weights   03/08/17 1728 03/09/17 0230  Weight: 87.1 kg (192 lb) 85.9 kg (189 lb 6 oz)    Exam: Awake Alert, Oriented *3, No new F.N deficits, Normal affect [place].AT,PERRAL Supple Neck,No JVD, No cervical lymphadenopathy appriciated.  Symmetrical Chest wall movement, Good air movement bilaterally, CTAB RRR,No Gallops,Rubs or new Murmurs, No Parasternal Heave +ve B.Sounds, Abd Soft, Non tender, No organomegaly appriciated, No rebound -guarding or rigidity. No Cyanosis, Clubbing or edema, No new Rash or bruise   PERTINENT  RADIOLOGIC STUDIES: Ct Angio Head W Or Wo Contrast  Result Date: 03/11/2017 CLINICAL DATA:  Follow-up CVA EXAM: CT ANGIOGRAPHY HEAD AND NECK TECHNIQUE:  Multidetector CT imaging of the head and neck was performed using the standard protocol during bolus administration of intravenous contrast. Multiplanar CT image reconstructions and MIPs were obtained to evaluate the vascular anatomy. Carotid stenosis measurements (when applicable) are obtained utilizing NASCET criteria, using the distal internal carotid diameter as the denominator. CONTRAST:  50 mL Isovue 370 COMPARISON:  Head CT 03/10/2017 FINDINGS: CTA NECK FINDINGS Aortic arch: There is no aneurysm or dissection of the visualized ascending aorta or aortic arch. There is a normal 3 vessel branching pattern. The visualized proximal subclavian arteries are normal. Right carotid system: The right common carotid origin is widely patent. There is no common carotid or internal carotid artery dissection or aneurysm. No hemodynamically significant stenosis. Left carotid system: The left common carotid origin is widely patent. There is no common carotid or internal carotid artery dissection or aneurysm. No hemodynamically significant stenosis. Vertebral arteries: The vertebral system is codominant. Both vertebral artery origins are normal. Both vertebral arteries are normal to their confluence with the basilar artery. Skeleton: There is no bony spinal canal stenosis. No lytic or blastic lesions. Other neck: The nasopharynx is clear. The oropharynx and hypopharynx are normal. The epiglottis is normal. The supraglottic larynx, glottis and subglottic larynx are normal. No retropharyngeal collection. The parapharyngeal spaces are preserved. The parotid and submandibular glands are normal. No sialolithiasis or salivary ductal dilatation. The thyroid gland is normal. There is no cervical lymphadenopathy. Upper chest: No pneumothorax or pleural effusion. No nodules or masses. Review of the MIP images confirms the above findings CTA HEAD FINDINGS Anterior circulation: --Intracranial internal carotid arteries: Normal. --Anterior  cerebral arteries: Normal. --Middle cerebral arteries: Normal. --Posterior communicating arteries: Absent bilaterally. Posterior circulation: --Posterior cerebral arteries: Normal. --Superior cerebellar arteries: Normal. --Basilar artery: Normal. --Anterior inferior cerebellar arteries: Normal on the right. Not clearly seen on the left. --Posterior inferior cerebellar arteries: Normal on the right but not visualized on the left. Venous sinuses: As permitted by contrast timing, patent. Anatomic variants: None Delayed phase: No parenchymal contrast enhancement. Area of cytotoxic edema in the left PICA distribution is unchanged in size, with persistent narrowing of the fourth ventricle and basal cisterns. The sizes of the lateral ventricle temporal horns are unchanged. No hemorrhage. Unchanged crowding of the foramen magnum. Review of the MIP images confirms the above findings IMPRESSION: 1. 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 2. Left AICA is not clearly visualized, but the AICA origin and caliber are highly variable and it is often not identified on scans of normal patients. 3. Unchanged left cerebellar cytotoxic edema with mass effect on the fourth ventricle and basal cisterns. Crowding of the foramen magnum is also unchanged. 4. No hemorrhage or hydrocephalus. Electronically Signed   By: Ulyses Jarred M.D.   On: 03/11/2017 06:34   Dg Chest 2 View  Result Date: 03/09/2017 CLINICAL DATA:  Headache, dizziness for today, nausea and vomiting EXAM: CHEST  2 VIEW COMPARISON:  Chest x-ray of 10/13/2016 FINDINGS: No active infiltrate or effusion is seen. Minimal volume loss is noted at the right lung base. Mediastinal and hilar contours are unremarkable. The heart is borderline enlarged and stable. No bony abnormality is seen. IMPRESSION: No active lung disease. Minimal volume loss at the right lung base. Stable borderline cardiomegaly. Electronically Signed   By: Eddie Dibbles  Alvester Chou M.D.   On: 03/09/2017 11:11   Ct Head Wo Contrast  Result Date: 03/17/2017 CLINICAL DATA:  61 year old male status post confluent left cerebellar infarct with cerebellar edema, mild ventriculomegaly. EXAM: CT HEAD WITHOUT CONTRAST TECHNIQUE: Contiguous axial images were obtained from the base of the skull through the vertex without intravenous contrast. COMPARISON:  03/13/2017 and earlier. FINDINGS: Brain: Confluent left cerebellar cytotoxic edema persists, although patency of the fourth ventricle has mildly improved since 03/13/2017, and both temporal horns are somewhat smaller today (series 3, image 11 today versus series 3, image 11 previously - and coronal image 37 today versus coronal image 35 previously). Other basilar cistern patency might also be mildly improved. No acute intracranial hemorrhage identified. No supratentorial mass effect. Stable gray-white matter differentiation elsewhere. No new cortically based infarct. Vascular: No suspicious intracranial vascular hyperdensity. Skull: Chronic left lamina papyracea fracture. No acute osseous abnormality identified. Sinuses/Orbits: Visualized paranasal sinuses and mastoids are stable and well pneumatized. Other: No acute orbit or scalp soft tissue findings. IMPRESSION: 1. Mildly improved; mild regression of left cerebellar edema and mild ventriculomegaly since 03/13/2017, which are related to the confluent left cerebellar infarct. 2. No new intracranial abnormality. Electronically Signed   By: Genevie Ann M.D.   On: 03/17/2017 09:16   Ct Head Wo Contrast  Result Date: 03/13/2017 CLINICAL DATA:  Follow-up stroke. EXAM: CT HEAD WITHOUT CONTRAST TECHNIQUE: Contiguous axial images were obtained from the base of the skull through the vertex without intravenous contrast. COMPARISON:  MRI of the head March 08, 2017 and CT HEAD March 10, 2017 FINDINGS: BRAIN: Evolving LEFT cerebellar infarct with ex cerebellar folia effacement. Similar mass effect on the  fourth ventricle with mild hydrocephalus. No new infarcts. No hemorrhagic conversion. Mildly effaced super of sellar cistern is similar. Patchy supratentorial white matter hypodensities unchanged. Old small LEFT occipital lobe infarcts. VASCULAR: Trace calcific atherosclerosis of the carotid siphons. SKULL: No skull fracture. Old bilateral nasal bone fractures. Small RIGHT frontal scalp lipoma. No significant scalp soft tissue swelling. SINUSES/ORBITS: Trace RIGHT mastoid effusion. Old LEFT medial orbital blowout fracture. OTHER: None. IMPRESSION: Evolving LEFT posterior-inferior cerebellar artery territory infarct nearly effacing the fourth ventricle resulting in stable mild hydrocephalus. Moderate chronic small vessel ischemic disease and old small LEFT occipital lobe infarct. Electronically Signed   By: Elon Alas M.D.   On: 03/13/2017 04:51   Ct Head Wo Contrast  Addendum Date: 03/10/2017   ADDENDUM REPORT: 03/10/2017 10:32 ADDENDUM: Study discussed by telephone with Dr. Rosalin Hawking on 03/10/2017 at 1025 hours. Electronically Signed   By: Genevie Ann M.D.   On: 03/10/2017 10:32   Result Date: 03/10/2017 CLINICAL DATA:  61 year old male with large left PICA infarct. Recent headache and nausea. EXAM: CT HEAD WITHOUT CONTRAST TECHNIQUE: Contiguous axial images were obtained from the base of the skull through the vertex without intravenous contrast. COMPARISON:  Head CT without contrast 03/09/2017. Brain MRI 03/08/2017, and earlier FINDINGS: Brain: Continued confluent cytotoxic edema throughout the left cerebellum. Mass effect on the fourth ventricle appears mildly progressed, and the temporal horns are mildly larger than yesterday. See series 3, image 9). Other posterior fossa mass effect has not significantly changed. Basilar cisterns are stable. No transependymal edema is evident. No supratentorial mass effect or midline shift. No acute intracranial hemorrhage identified. Stable gray-white matter  differentiation throughout the brain. Vascular: No suspicious intracranial vascular hyperdensity. Skull: Chronic left lamina papyracea fracture. No acute osseous abnormality identified. Sinuses/Orbits: Visualized paranasal sinuses and mastoids are stable and  well pneumatized. Other: No acute orbit or scalp soft tissue findings. IMPRESSION: 1. Continued moderate to severe left cerebellar edema with interval mildly increased mass effect on the fourth ventricle and early lateral ventriculomegaly - mild enlargement of the temporal horn since yesterday but no transependymal edema is evident. 2. No hemorrhagic transformation or new intracranial abnormality. Electronically Signed: By: Genevie Ann M.D. On: 03/10/2017 10:22   Ct Head Wo Contrast  Result Date: 03/09/2017 CLINICAL DATA:  Headache and nausea.  No trauma. EXAM: CT HEAD WITHOUT CONTRAST TECHNIQUE: Contiguous axial images were obtained from the base of the skull through the vertex without intravenous contrast. COMPARISON:  MR brain 03/08/2017. FINDINGS: Brain: Extensive cytotoxic edema in the cerebellum, associated with a LEFT PICA territory infarct. 3 mm LEFT-to-RIGHT shift of the fourth ventricle is increased. No hemorrhagic transformation. No hydrocephalus. No other areas of infarction are not clearly visible. Vascular: No proximal large vessel occlusion. Hyperdensity of the LEFT PICA correlates with lack of flow on MR. Skull: Normal. Negative for fracture or focal lesion. Sinuses/Orbits: No acute finding. Other: None. IMPRESSION: Extensive cytotoxic edema in the cerebellum associated with the documented LEFT PICA territory infarct. 3 mm LEFT-to-RIGHT shift of the fourth ventricle is increased from yesterday's MR. Close surveillance recommended for symptomatic tonsillar herniation, future hemorrhagic transformation, and/or development of hydrocephalus. These results will be called to the ordering clinician or representative by the Radiologist Assistant, and  communication documented in the PACS or zVision Dashboard. Electronically Signed   By: Staci Righter M.D.   On: 03/09/2017 11:03   Ct Angio Neck W Or Wo Contrast  Result Date: 03/11/2017 CLINICAL DATA:  Follow-up CVA EXAM: CT ANGIOGRAPHY HEAD AND NECK TECHNIQUE: Multidetector CT imaging of the head and neck was performed using the standard protocol during bolus administration of intravenous contrast. Multiplanar CT image reconstructions and MIPs were obtained to evaluate the vascular anatomy. Carotid stenosis measurements (when applicable) are obtained utilizing NASCET criteria, using the distal internal carotid diameter as the denominator. CONTRAST:  50 mL Isovue 370 COMPARISON:  Head CT 03/10/2017 FINDINGS: CTA NECK FINDINGS Aortic arch: There is no aneurysm or dissection of the visualized ascending aorta or aortic arch. There is a normal 3 vessel branching pattern. The visualized proximal subclavian arteries are normal. Right carotid system: The right common carotid origin is widely patent. There is no common carotid or internal carotid artery dissection or aneurysm. No hemodynamically significant stenosis. Left carotid system: The left common carotid origin is widely patent. There is no common carotid or internal carotid artery dissection or aneurysm. No hemodynamically significant stenosis. Vertebral arteries: The vertebral system is codominant. Both vertebral artery origins are normal. Both vertebral arteries are normal to their confluence with the basilar artery. Skeleton: There is no bony spinal canal stenosis. No lytic or blastic lesions. Other neck: The nasopharynx is clear. The oropharynx and hypopharynx are normal. The epiglottis is normal. The supraglottic larynx, glottis and subglottic larynx are normal. No retropharyngeal collection. The parapharyngeal spaces are preserved. The parotid and submandibular glands are normal. No sialolithiasis or salivary ductal dilatation. The thyroid gland is  normal. There is no cervical lymphadenopathy. Upper chest: No pneumothorax or pleural effusion. No nodules or masses. Review of the MIP images confirms the above findings CTA HEAD FINDINGS Anterior circulation: --Intracranial internal carotid arteries: Normal. --Anterior cerebral arteries: Normal. --Middle cerebral arteries: Normal. --Posterior communicating arteries: Absent bilaterally. Posterior circulation: --Posterior cerebral arteries: Normal. --Superior cerebellar arteries: Normal. --Basilar artery: Normal. --Anterior inferior cerebellar arteries: Normal on  the right. Not clearly seen on the left. --Posterior inferior cerebellar arteries: Normal on the right but not visualized on the left. Venous sinuses: As permitted by contrast timing, patent. Anatomic variants: None Delayed phase: No parenchymal contrast enhancement. Area of cytotoxic edema in the left PICA distribution is unchanged in size, with persistent narrowing of the fourth ventricle and basal cisterns. The sizes of the lateral ventricle temporal horns are unchanged. No hemorrhage. Unchanged crowding of the foramen magnum. Review of the MIP images confirms the above findings IMPRESSION: 1. 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 2. Left AICA is not clearly visualized, but the AICA origin and caliber are highly variable and it is often not identified on scans of normal patients. 3. Unchanged left cerebellar cytotoxic edema with mass effect on the fourth ventricle and basal cisterns. Crowding of the foramen magnum is also unchanged. 4. No hemorrhage or hydrocephalus. Electronically Signed   By: Ulyses Jarred M.D.   On: 03/11/2017 06:34   [name]  Result Date: 03/08/2017 CLINICAL DATA:  Vertigo EXAM: MRI HEAD WITHOUT CONTRAST TECHNIQUE: Multiplanar, multiecho pulse sequences of the brain and surrounding structures were obtained without intravenous contrast. COMPARISON:  Head CT 08/27/2016  FINDINGS: The examination had to be discontinued prior to completion due to patient refusal to continue. Brain: There is a large area of diffusion restriction within the left cerebellar hemisphere, in the PICA territory, with associated cytotoxic edema and rightward displacement of the midline posterior fossa structures. There is also a small focus of diffusion restriction within the left occipital lobe. No acute hemorrhage. The basal cisterns, cerebral aqueduct and fourth ventricle remain patent. No hydrocephalus or extra-axial fluid collection. The midline structures are normal. No age advanced or lobar predominant atrophy. There is multifocal hyperintense T2-weighted signal within the periventricular white matter, most often seen in the setting of chronic microvascular ischemia. Vascular: Major intracranial arterial and venous sinus flow voids are preserved. There are scattered foci of chronic microhemorrhage within both cerebral hemispheres, the brainstem and cerebellum. The distribution is nonspecific. Skull and upper cervical spine: The visualized skull base, calvarium, upper cervical spine and extracranial soft tissues are normal. Sinuses/Orbits: No fluid levels or advanced mucosal thickening. No mastoid effusion. Normal orbits. IMPRESSION: 1. Truncated examination due to patient refusal to complete all sequences. 2. 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 The basal cisterns and fourth ventricle remain patent without obstructive hydrocephalus. No acute hemorrhage. 3. Small left occipital acute infarct. 4. Chronic microvascular ischemia and scattered punctate foci of chronic microhemorrhage. These results will be called to the ordering clinician or representative by the Radiologist Assistant, and communication documented in the PACS or zVision Dashboard. Electronically Signed   By: Ulyses Jarred M.D.   On:  03/08/2017 23:25   Ir Ivc Filter Plmt / S&i /img Guid/mod Sed  Result Date: 03/10/2017 CLINICAL DATA:  Lower extremity DVT. 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 Elevated creatinine. EXAM: INFERIOR VENACAVOGRAM IVC FILTER PLACEMENT UNDER FLUOROSCOPY FLUOROSCOPY TIME:  1 minutes 24 seconds, 49 mGy TECHNIQUE: The procedure, risks (including but not limited to bleeding, infection, organ damage ), benefits, and alternatives were explained to the patient. Questions regarding the procedure were encouraged and answered. The patient understands and consents to the procedure. Patency of the right IJ vein was confirmed with ultrasound with image documentation. An appropriate skin site was determined. Skin site was marked,  prepped with chlorhexidine, and draped using maximum barrier technique. The region was infiltrated locally with 1% lidocaine. Intravenous Fentanyl and Versed were administered as conscious sedation during continuous monitoring of the patient's level of consciousness and physiological / cardiorespiratory status by the radiology RN, with a total moderate sedation time of 11 minutes. Under real-time ultrasound guidance, the right IJ vein was accessed with a 21 gauge micropuncture needle; the needle tip within the vein was confirmed with ultrasound image documentation. The needle was exchanged over a 018 guidewire for a transitional dilator, which allow advancement of the long 6 [place] vascular sheath placed for CO2 inferior venacavography. This demonstrated no caval thrombus. Renal vein inflows were evident, corresponding to findings on previous abdomen CT. The [facility] IVC filter was advanced through the sheath and successfully deployed under fluoroscopy at the L3 level. Followup cavagram demonstrates stable filter position and no evident complication. The sheath was removed and hemostasis achieved at the site. No immediate complication. IMPRESSION: 1. Normal IVC. No thrombus or  significant anatomic variation. 2. Technically successful infrarenal IVC filter placement. This is a retrievable model. PLAN: This IVC filter is potentially retrievable. The patient will be assessed for filter retrieval by Interventional Radiology in approximately 8-12 weeks. Further recommendations regarding filter retrieval, continued surveillance or declaration of device permanence, will be made at that time. Electronically Signed   By: Lucrezia Europe M.D.   On: 03/10/2017 12:33   Dg Chest Port 1 View  Result Date: 03/10/2017 CLINICAL DATA:  Central venous catheter placement. EXAM: PORTABLE CHEST 1 VIEW COMPARISON:  03/09/2017 and prior radiographs FINDINGS: Upper limits normal heart size, mediastinal fullness and elevation of the right hemidiaphragm are unchanged. A left IJ central venous catheter is noted with tip overlying the upper SVC. There is no evidence of focal airspace disease, pulmonary edema, suspicious pulmonary nodule/mass, pleural effusion, or pneumothorax. No acute bony abnormalities are identified. IMPRESSION: Left IJ central venous catheter with tip overlying the upper SVC. No evidence of pneumothorax. Electronically Signed   By: Margarette Canada M.D.   On: 03/10/2017 12:45     PERTINENT LAB RESULTS: CBC:  Recent Labs  03/20/17 0318  WBC 12.7*  HGB 11.8*  HCT 35.5*  PLT 354   CMET CMP     Component Value Date/Time   NA 134 (L) 03/20/2017 0318   K 3.6 03/20/2017 0318   CL 102 03/20/2017 0318   CO2 20 (L) 03/20/2017 0318   GLUCOSE 146 (H) 03/20/2017 0318   BUN 27 (H) 03/20/2017 0318   CREATININE 1.67 (H) 03/20/2017 0318   CALCIUM 9.1 03/20/2017 0318   PROT 8.0 03/08/2017 2105   ALBUMIN 4.1 03/08/2017 2105   AST 30 03/08/2017 2105   ALT 14 (L) 03/08/2017 2105   ALKPHOS 72 03/08/2017 2105   BILITOT 0.7 03/08/2017 2105   GFRNONAA 43 (L) 03/20/2017 0318   GFRAA 50 (L) 03/20/2017 0318    GFR Estimated Creatinine Clearance: 50.8 mL/min (A) (by C-G formula based on SCr of  1.67 mg/dL (H)). No results for input(s): LIPASE, AMYLASE in the last 72 hours. No results for input(s): CKTOTAL, CKMB, CKMBINDEX, TROPONINI in the last 72 hours. Invalid input(s): POCBNP No results for input(s): DDIMER in the last 72 hours. No results for input(s): HGBA1C in the last 72 hours. No results for input(s): CHOL, HDL, LDLCALC, TRIG, CHOLHDL, LDLDIRECT in the last 72 hours. No results for input(s): TSH, T4TOTAL, T3FREE, THYROIDAB in the last 72 hours.  Invalid input(s): FREET3 No results for input(s):  VITAMINB12, FOLATE, FERRITIN, TIBC, IRON, RETICCTPCT in the last 72 hours. Coags: No results for input(s): INR in the last 72 hours.  Invalid input(s): PT Microbiology: Recent Results (from the past 240 hour(s))  Culture, blood (routine x 2)     Status: None (Preliminary result)   Collection Time: 03/17/17  8:24 AM  Result Value Ref Range Status   Specimen Description BLOOD RIGHT ARM  Final   Special Requests IN PEDIATRIC BOTTLE Blood Culture adequate volume  Final   Culture  Setup Time   Final    GRAM POSITIVE RODS IN PEDIATRIC BOTTLE CRITICAL RESULT CALLED TO, READ BACK BY AND VERIFIED WITH: K AMEND 03/19/17 @ 0650 M VESTAL K AMEND 03/19/17 @ 0650 M VESTAL    Culture   Final    GRAM POSITIVE RODS CULTURE REINCUBATED FOR BETTER GROWTH    Report Status PENDING  Incomplete  Culture, blood (routine x 2)     Status: None (Preliminary result)   Collection Time: 03/17/17  8:24 AM  Result Value Ref Range Status   Specimen Description BLOOD RIGHT HAND  Final   Special Requests IN PEDIATRIC BOTTLE Blood Culture adequate volume  Final   Culture  Setup Time   Final    GRAM POSITIVE RODS IN PEDIATRIC BOTTLE CRITICAL VALUE NOTED.  VALUE IS CONSISTENT WITH PREVIOUSLY REPORTED AND CALLED VALUE.    Culture   Final    GRAM POSITIVE RODS CULTURE REINCUBATED FOR BETTER GROWTH    Report Status PENDING  Incomplete  Culture, Urine     Status: Abnormal   Collection Time: 03/17/17 12:00  PM  Result Value Ref Range Status   Specimen Description URINE, RANDOM  Final   Special Requests NONE  Final   Culture MULTIPLE SPECIES PRESENT, SUGGEST RECOLLECTION (A)  Final   Report Status 03/18/2017 FINAL  Final    FURTHER DISCHARGE INSTRUCTIONS:  Get Medicines reviewed and adjusted: Please take all your medications with you for your next visit with your Primary MD  Laboratory/radiological data: Please request your Primary MD to go over all hospital tests and procedure/radiological results at the follow up, please ask your Primary MD to get all Hospital records sent to his/her office.  In some cases, they will be blood work, cultures and biopsy results pending at the time of your discharge. Please request that your primary care M.D. goes through all the records of your hospital data and follows up on these results.  Also Note the following: If you experience worsening of your admission symptoms, develop shortness of breath, life threatening emergency, suicidal or homicidal thoughts you must seek medical attention immediately by calling 911 or calling your MD immediately  if symptoms less severe.  You must read complete instructions/literature along with all the possible adverse reactions/side effects for all the Medicines you take and that have been prescribed to you. Take any new Medicines after you have completely understood and accpet all the possible adverse reactions/side effects.   Do not drive when taking Pain medications or sleeping medications (Benzodaizepines)  Do not take more than prescribed Pain, Sleep and Anxiety Medications. It is not advisable to combine anxiety,sleep and pain medications without talking with your primary care practitioner  Special Instructions: If you have smoked or chewed Tobacco  in the last 2 yrs please stop smoking, stop any regular Alcohol  and or any Recreational drug use.  Wear Seat belts while driving.  Please note: You were cared for by  a hospitalist during your hospital stay.  Once you are discharged, your primary care physician will handle any further medical issues. Please note that NO REFILLS for any discharge medications will be authorized once you are discharged, as it is imperative that you return to your primary care physician (or establish a relationship with a primary care physician if you do not have one) for your post hospital discharge needs so that they can reassess your need for medications and monitor your lab values.  Total Time spent coordinating discharge including counseling, education and face to face time equals  45 minutes.  SignedOren Binet 03/20/2017 11:33 AM

## 2017-03-20 NOTE — Progress Notes (Addendum)
[name] to be D/C'd Home per MD order.  Discussed with the patient and all questions fully answered.  VSS, Skin clean, dry and intact without evidence of skin break down, no evidence of skin tears noted.   An After Visit Summary was printed and given to the patient. Patient received prescription.  D/c education completed with patient including follow up instructions, medication list, d/c activities limitations if indicated, with other d/c instructions as indicated by MD - patient able to verbalize understanding, all questions fully answered.   Patient instructed to return to ED, call 911, or call MD for any changes in condition.   Patient escorted via [place], and D/C home via public bus transportation bus ticket given.  Karolee Ohs 03/20/2017 2:54 PM

## 2017-03-20 NOTE — Progress Notes (Signed)
Patient removed IV stating he did not need it anymore.  Tried to explain that it was there for safety purposes in case an access was needed.  Will continue to monitor. Wilson Singer, RN

## 2017-03-21 LAB — CULTURE, BLOOD (ROUTINE X 2)
SPECIAL REQUESTS: ADEQUATE
SPECIAL REQUESTS: ADEQUATE

## 2017-12-30 ENCOUNTER — Emergency Department (HOSPITAL_COMMUNITY)
Admission: EM | Admit: 2017-12-30 | Discharge: 2017-12-30 | Disposition: A | Discharge status: Home / Self Care | Payer: Medicaid Other | Attending: Emergency Medicine | Admitting: Emergency Medicine

## 2017-12-30 ENCOUNTER — Emergency Department (HOSPITAL_COMMUNITY): Payer: Medicaid Other

## 2017-12-30 ENCOUNTER — Encounter (HOSPITAL_COMMUNITY): Payer: Self-pay | Admitting: Emergency Medicine

## 2017-12-30 DIAGNOSIS — Z8673 Personal history of transient ischemic attack (TIA), and cerebral infarction without residual deficits: Secondary | ICD-10-CM | POA: Diagnosis not present

## 2017-12-30 DIAGNOSIS — I129 Hypertensive chronic kidney disease with stage 1 through stage 4 chronic kidney disease, or unspecified chronic kidney disease: Secondary | ICD-10-CM | POA: Diagnosis not present

## 2017-12-30 DIAGNOSIS — F17210 Nicotine dependence, cigarettes, uncomplicated: Secondary | ICD-10-CM | POA: Insufficient documentation

## 2017-12-30 DIAGNOSIS — Z7982 Long term (current) use of aspirin: Secondary | ICD-10-CM | POA: Insufficient documentation

## 2017-12-30 DIAGNOSIS — N184 Chronic kidney disease, stage 4 (severe): Secondary | ICD-10-CM | POA: Insufficient documentation

## 2017-12-30 DIAGNOSIS — R0789 Other chest pain: Secondary | ICD-10-CM | POA: Diagnosis not present

## 2017-12-30 DIAGNOSIS — R079 Chest pain, unspecified: Secondary | ICD-10-CM

## 2017-12-30 DIAGNOSIS — Z79899 Other long term (current) drug therapy: Secondary | ICD-10-CM | POA: Diagnosis not present

## 2017-12-30 LAB — CBC
HCT: 38.5 % — ABNORMAL LOW (ref 39.0–52.0)
Hemoglobin: 12.9 g/dL — ABNORMAL LOW (ref 13.0–17.0)
MCH: 27.6 pg (ref 26.0–34.0)
MCHC: 33.5 g/dL (ref 30.0–36.0)
MCV: 82.4 fL (ref 78.0–100.0)
PLATELETS: 258 10*3/uL (ref 150–400)
RBC: 4.67 MIL/uL (ref 4.22–5.81)
RDW: 14.9 % (ref 11.5–15.5)
WBC: 8.4 10*3/uL (ref 4.0–10.5)

## 2017-12-30 LAB — I-STAT TROPONIN, ED: Troponin i, poc: 0.01 ng/mL (ref 0.00–0.08)

## 2017-12-30 LAB — BASIC METABOLIC PANEL
Anion gap: 9 (ref 5–15)
BUN: 22 mg/dL — ABNORMAL HIGH (ref 6–20)
CHLORIDE: 106 mmol/L (ref 101–111)
CO2: 24 mmol/L (ref 22–32)
CREATININE: 1.87 mg/dL — AB (ref 0.61–1.24)
Calcium: 9.3 mg/dL (ref 8.9–10.3)
GFR calc Af Amer: 43 mL/min — ABNORMAL LOW (ref >60)
GFR calc non Af Amer: 37 mL/min — ABNORMAL LOW (ref >60)
Glucose, Bld: 97 mg/dL (ref 65–99)
Potassium: 3.4 mmol/L — ABNORMAL LOW (ref 3.5–5.1)
SODIUM: 139 mmol/L (ref 135–145)

## 2017-12-30 NOTE — Discharge Instructions (Signed)
Your evaluated today in the emergency department for chest pain.  Your EKG chest x-ray and blood work were unremarkable.  He will need to follow-up with your primary care doctor for further evaluation and management of your high blood pressure and your kidney disease.  Please return to the hospital for any concerns.

## 2017-12-30 NOTE — ED Triage Notes (Signed)
Patient here from home with complaints of chest pain nonradiating. Reports that he has had chest pain "for years". "Since ive been on this earth". Denies nausea, vomiting. Ambulatory.

## 2017-12-30 NOTE — ED Provider Notes (Signed)
[place] DEPT Provider Note   CSN: [id number] Arrival date & time: 12/30/17  1726     History   Chief Complaint Chief Complaint  Patient presents with  . Chest Pain    HPI Darryl Parsons is a 62 y.o. male.  The history is provided by the patient.  Chest Pain   This is a chronic problem. The problem occurs daily. The problem has not changed since onset.The pain is associated with exertion, lifting, movement and raising an arm. The pain is present in the lateral region. The pain is moderate. The quality of the pain is described as sharp and stabbing. The pain does not radiate. Pertinent negatives include no abdominal pain, no back pain, no cough, no diaphoresis, no dizziness, no fever, no nausea, no palpitations, no shortness of breath, no syncope and no vomiting. He has tried nothing for the symptoms. Risk factors include male gender and smoking/tobacco exposure.  His past medical history is significant for hypertension.  Pertinent negatives for past medical history include no seizures.    69-year-old male with history of hypertension and CKD.  He presents today wanting to get his chest pain he has had right sided anterior sharp stabbing chest pain that is been going on for 3 years plus.  He is a smoker and is concerned this might be his lung cancer.  He had a primary care doctor but was fired from his doctor for making threatening statements.  He is got no cough no shortness of breath no abdominal pain no vomiting diarrhea.  Past Medical History:  Diagnosis Date  . Chronic kidney disease   . Headache   . Hypertension   . Low back pain   . Mental disorder   . Schizophrenia [facility])     Patient Active Problem List   Diagnosis Date Noted  . Vertigo   . DVT (deep vein thrombosis) in pregnancy ([place])   . Central venous catheter in place   . 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 Stroke (cerebrum) ([place]) 03/09/2017  . Hypovitaminosis   . Tobacco use disorder   . Overdose 10/06/2016  . Schizoaffective disorder, depressive type ([place]) 08/27/2016  . Cocaine use disorder, mild, abuse ([place]) 08/27/2016  . Essential hypertension 08/27/2016  . Chronic headache 08/27/2016  . Suicidal ideation 02/03/2016  . Encephalopathy acute 01/30/2016  . CKD (chronic kidney disease) stage 4, GFR 15-29 ml/min ([facility]) 01/30/2016  . Benign essential HTN 01/30/2016  . Cocaine abuse ([place]) 08/21/2012  . Hypokalemia 08/21/2012    Past Surgical History:  Procedure Laterality Date  . IR IVC FILTER PLMT / S&I /IMG GUID/MOD SED  03/10/2017       Home Medications    Prior to Admission medications   Medication Sig Start Date End Date Taking? Authorizing Provider  amLODipine (NORVASC) 10 MG tablet Take 1 tablet (10 mg total) by mouth daily. 03/20/17   Ghimire, Henreitta Leber, MD  aspirin 325 MG tablet Take 1 tablet (325 mg total) by mouth daily. 03/21/17   Ghimire, Henreitta Leber, MD  atorvastatin (LIPITOR) 40 MG tablet Take 1 tablet (40 mg total) by mouth daily. 03/20/17   Ghimire, Henreitta Leber, MD  CATAPRES-TTS-2 0.2 MG/24HR patch apply 1 patch by transdermal route once weekly for 30 days 10/31/17   [provider]  cloNIDine (CATAPRES - DOSED IN MG/24 HR) 0.2 mg/24hr patch Place 1 patch (0.2 mg total) onto  the skin once a week. 03/20/17   Ghimire, Henreitta Leber, MD  cyclobenzaprine (FLEXERIL) 5 MG tablet TAKE ONE TABLET BY MOUTH THREE TIMES DAILY FOR FOURTEEN DAYS 11/24/17   [provider]  hydrochlorothiazide (HYDRODIURIL) 50 MG tablet Take 50 mg by mouth 2 (two) times daily. 12/03/17   [provider]  hydrochlorothiazide (MICROZIDE) 12.5 MG capsule Take 1 capsule (12.5 mg total) by mouth daily. 03/20/17   Ghimire, Henreitta Leber, MD  methocarbamol (ROBAXIN) 750 MG tablet TAKE ONE TABLET BY MOUTH THREE TIMES DAILY FOR 30 DAYS 11/10/17   [provider]  QUEtiapine  (SEROQUEL) 300 MG tablet Take 1 tablet (300 mg total) by mouth at bedtime. 08/31/16   Withrow, Elyse Jarvis, FNP  sertraline (ZOLOFT) 50 MG tablet Take 1 tablet (50 mg total) by mouth daily. 09/01/16   Withrow, Elyse Jarvis, FNP  terbinafine (LAMISIL) 250 MG tablet Take 250 mg by mouth daily. 12/02/17   [provider]  thiamine 100 MG tablet Take 1 tablet (100 mg total) by mouth daily. Patient not taking: Reported on 03/08/2017 09/01/16   Benjamine Mola, FNP    Family History Family History  Problem Relation Age of Onset  . Cardiomyopathy Sister   . Lupus Sister   . Kidney failure Sister   . Factor VIII deficiency Neg Hx   . Mental illness Neg Hx     Social History Social History   Tobacco Use  . Smoking status: Current Every Day Smoker    Packs/day: 0.50    Types: Cigarettes  . Smokeless tobacco: Never Used  Substance Use Topics  . Alcohol use: Yes    Comment: 1-2 beers a month  . Drug use: Yes    Types: Cocaine     Allergies   Other   Review of Systems Review of Systems  Constitutional: Negative for chills, diaphoresis and fever.  HENT: Negative for ear pain and sore throat.   Eyes: Negative for pain and visual disturbance.  Respiratory: Negative for cough and shortness of breath.   Cardiovascular: Positive for chest pain. Negative for palpitations and syncope.  Gastrointestinal: Negative for abdominal pain, nausea and vomiting.  Genitourinary: Negative for dysuria and hematuria.  Musculoskeletal: Negative for arthralgias and back pain.  Skin: Negative for color change and rash.  Neurological: Negative for dizziness, seizures and syncope.  All other systems reviewed and are negative.    Physical Exam Updated Vital Signs BP (!) 136/93 (BP Location: Left Arm)   Pulse 77   Temp 98 F (36.7 C) (Oral)   Resp 20   SpO2 99%   Physical Exam  Constitutional: He appears well-developed and well-nourished.  HENT:  Head: Normocephalic and atraumatic.  Eyes:  Conjunctivae are normal.  Neck: Neck supple.  Cardiovascular: Normal rate, regular rhythm and normal pulses.  No murmur heard. Pulmonary/Chest: Effort normal and breath sounds normal. No respiratory distress.  Abdominal: Soft. There is no tenderness.  Musculoskeletal: He exhibits no edema, tenderness or deformity.  Neurological: He is alert.  Skin: Skin is warm and dry.  Psychiatric: He has a normal mood and affect.  Nursing note and vitals reviewed.    ED Treatments / Results  Labs (all labs ordered are listed, but only abnormal results are displayed) Labs Reviewed  BASIC METABOLIC PANEL - Abnormal; Notable for the following components:      Result Value   Potassium 3.4 (*)    BUN 22 (*)    Creatinine, Ser 1.87 (*)    GFR  calc non Af Amer 37 (*)    GFR calc Af Amer 43 (*)    All other components within normal limits  CBC - Abnormal; Notable for the following components:   Hemoglobin 12.9 (*)    HCT 38.5 (*)    All other components within normal limits  I-STAT TROPONIN, ED    EKG  EKG Interpretation  Date/Time:  Friday December 30 2017 17:41:12 EST Ventricular Rate:  72 PR Interval:    QRS Duration: 92 QT Interval:  392 QTC Calculation: 429 R Axis:   -46 Text Interpretation:  Sinus rhythm Probable left atrial enlargement Left anterior fascicular block Abnormal R-wave progression, early transition Baseline wander in lead(s) I III aVR aVL V4 Confirmed by Aletta Edouard [phone]) on 12/30/2017 10:14:33 PM       Radiology Dg Chest 2 View  Result Date: 12/30/2017 CLINICAL DATA: Intermittent chest pain EXAM: CHEST  2 VIEW COMPARISON:  March 10, 2017 FINDINGS: The heart size and mediastinal contours are within normal limits. Both lungs are clear. The visualized skeletal structures are unremarkable. IMPRESSION: No active cardiopulmonary disease. Electronically Signed   By: Dorise Bullion III M.D   On: 12/30/2017 18:38    Procedures Procedures (including critical care  time)  Medications Ordered in ED Medications - No data to display   Initial Impression / Assessment and Plan / ED Course  I have reviewed the triage vital signs and the nursing notes.  Pertinent labs & imaging results that were available during my care of the patient were reviewed by me and considered in my medical decision making (see chart for details).  Clinical Course as of Dec 31 1221  Fri Dec 30, 2017  2227 Attempted to reach patient's legal guardian but unable to locate a phone number.  [MB]    Clinical Course User Index [MB] Hayden Rasmussen, MD   Patient is here with chronic chest pain.  His EKG is unchanged from prior chest x-ray normal troponin negative.  I do not feel there is any utility to doing a delta troponin as his pain is been going on years.  He is getting a new primary care doctor which I encouraged him to follow through with this he needs his CKD and his hypertension better managed.  I also explained that a single chest x-ray would not exclude the possibilities of lung cancer and he would need to follow-up with his doctor and to consider stopping smoking.  Final Clinical Impressions(s) / ED Diagnoses   Final diagnoses:  Chest pain in adult    ED Discharge Orders    None       Hayden Rasmussen, MD 12/31/17 1223

## 2018-01-06 ENCOUNTER — Encounter: Payer: Self-pay | Admitting: Gastroenterology

## 2018-01-14 ENCOUNTER — Other Ambulatory Visit: Payer: Self-pay

## 2018-06-08 ENCOUNTER — Other Ambulatory Visit: Payer: Self-pay

## 2018-06-08 ENCOUNTER — Emergency Department (HOSPITAL_COMMUNITY)
Admission: EM | Admit: 2018-06-08 | Discharge: 2018-06-08 | Disposition: A | Discharge status: Home / Self Care | Payer: Medicaid Other | Attending: Emergency Medicine | Admitting: Emergency Medicine

## 2018-06-08 ENCOUNTER — Encounter (HOSPITAL_COMMUNITY): Payer: Self-pay | Admitting: Emergency Medicine

## 2018-06-08 DIAGNOSIS — F17210 Nicotine dependence, cigarettes, uncomplicated: Secondary | ICD-10-CM | POA: Insufficient documentation

## 2018-06-08 DIAGNOSIS — M62838 Other muscle spasm: Secondary | ICD-10-CM | POA: Diagnosis not present

## 2018-06-08 DIAGNOSIS — Z7982 Long term (current) use of aspirin: Secondary | ICD-10-CM | POA: Diagnosis not present

## 2018-06-08 DIAGNOSIS — R519 Headache, unspecified: Secondary | ICD-10-CM

## 2018-06-08 DIAGNOSIS — I129 Hypertensive chronic kidney disease with stage 1 through stage 4 chronic kidney disease, or unspecified chronic kidney disease: Secondary | ICD-10-CM | POA: Diagnosis not present

## 2018-06-08 DIAGNOSIS — N184 Chronic kidney disease, stage 4 (severe): Secondary | ICD-10-CM | POA: Insufficient documentation

## 2018-06-08 DIAGNOSIS — R42 Dizziness and giddiness: Secondary | ICD-10-CM | POA: Diagnosis not present

## 2018-06-08 DIAGNOSIS — Z8673 Personal history of transient ischemic attack (TIA), and cerebral infarction without residual deficits: Secondary | ICD-10-CM | POA: Insufficient documentation

## 2018-06-08 DIAGNOSIS — R51 Headache: Secondary | ICD-10-CM | POA: Diagnosis not present

## 2018-06-08 DIAGNOSIS — Z79899 Other long term (current) drug therapy: Secondary | ICD-10-CM | POA: Diagnosis not present

## 2018-06-08 MED ORDER — KETOROLAC TROMETHAMINE 30 MG/ML IJ SOLN
30.0000 mg | Freq: Once | INTRAMUSCULAR | Status: AC
  Administered 2018-06-08: 30 mg via INTRAMUSCULAR
  Filled 2018-06-08: qty 1

## 2018-06-08 NOTE — ED Provider Notes (Signed)
[place] DEPT Provider Note   CSN: [id number] Arrival date & time: 06/08/18  2054     History   Chief Complaint No chief complaint on file.   HPI Darryl Parsons is a 62 y.o. male.  HPI Patient with history of hypertension, schizophrenia, chronic headaches, CVA, chronic dizziness, presents to emergency department with complaining of headache and neck pain.  Patient states he has had pain for years.  He states that he used to see Dr. Marlou Sa as his primary care doctor but states he got discharged from the practice and now does not have a primary care doctor.  He states "I do not like Dr. Marlou Sa and he does not like me so worked out perfect."  Patient states that he is currently taking muscle relaxants, Robaxin and Flexeril for his neck pain but they are not helping.  He states he has tried Tylenol but it does not help.  Patient states "I just need some pain pills and I will get out of your way."  He denies any new injuries.  He denies any numbness or tingling his hands or feet.  Patient states he is having difficulty sleeping because of pain.  He has not tried heat.  He has not tried anything else.  Past Medical History:  Diagnosis Date  . Chronic kidney disease   . Headache   . Hypertension   . Low back pain   . Mental disorder   . Schizophrenia [facility])     Patient Active Problem List   Diagnosis Date Noted  . Vertigo   . DVT (deep vein thrombosis) in pregnancy ([place])   . Central venous catheter in place   . 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 Stroke (cerebrum) ([place]) 03/09/2017  . Hypovitaminosis   . Tobacco use disorder   . Overdose 10/06/2016  . Schizoaffective disorder, depressive type ([place]) 08/27/2016  . Cocaine use disorder, mild, abuse ([place]) 08/27/2016  . Essential hypertension 08/27/2016  . Chronic headache 08/27/2016  . Suicidal ideation  02/03/2016  . Encephalopathy acute 01/30/2016  . CKD (chronic kidney disease) stage 4, GFR 15-29 ml/min ([facility]) 01/30/2016  . Benign essential HTN 01/30/2016  . Cocaine abuse ([place]) 08/21/2012  . Hypokalemia 08/21/2012    Past Surgical History:  Procedure Laterality Date  . IR IVC FILTER PLMT / S&I /IMG GUID/MOD SED  03/10/2017        Home Medications    Prior to Admission medications   Medication Sig Start Date End Date Taking? Authorizing Provider  amLODipine (NORVASC) 10 MG tablet Take 1 tablet (10 mg total) by mouth daily. 03/20/17   Ghimire, Henreitta Leber, MD  aspirin 325 MG tablet Take 1 tablet (325 mg total) by mouth daily. 03/21/17   Ghimire, Henreitta Leber, MD  atorvastatin (LIPITOR) 40 MG tablet Take 1 tablet (40 mg total) by mouth daily. Patient not taking: Reported on 12/30/2017 03/20/17   Jonetta Osgood, MD  cloNIDine (CATAPRES - DOSED IN MG/24 HR) 0.2 mg/24hr patch Place 1 patch (0.2 mg total) onto the skin once a week. Patient not taking: Reported on 12/30/2017 03/20/17   Jonetta Osgood, MD  cyclobenzaprine (FLEXERIL) 5 MG tablet TAKE ONE TABLET BY MOUTH THREE TIMES DAILY FOR FOURTEEN DAYS 11/24/17   [provider]  hydrochlorothiazide (HYDRODIURIL) 50 MG tablet Take 50 mg by mouth daily.  12/03/17   [provider]  hydrochlorothiazide (MICROZIDE) 12.5  MG capsule Take 1 capsule (12.5 mg total) by mouth daily. Patient not taking: Reported on 12/30/2017 03/20/17   Jonetta Osgood, MD  methocarbamol (ROBAXIN) 750 MG tablet TAKE ONE TABLET BY MOUTH THREE TIMES DAILY FOR 30 DAYS 11/10/17   [provider]  QUEtiapine (SEROQUEL) 300 MG tablet Take 1 tablet (300 mg total) by mouth at bedtime. Patient not taking: Reported on 12/30/2017 08/31/16   Benjamine Mola, FNP  sertraline (ZOLOFT) 50 MG tablet Take 1 tablet (50 mg total) by mouth daily. Patient not taking: Reported on 12/30/2017 09/01/16   Benjamine Mola, FNP  thiamine 100 MG tablet Take 1 tablet (100 mg  total) by mouth daily. Patient not taking: Reported on 03/08/2017 09/01/16   Benjamine Mola, FNP    Family History Family History  Problem Relation Age of Onset  . Cardiomyopathy Sister   . Lupus Sister   . Kidney failure Sister   . Factor VIII deficiency Neg Hx   . Mental illness Neg Hx     Social History Social History   Tobacco Use  . Smoking status: Current Every Day Smoker    Packs/day: 0.50    Types: Cigarettes  . Smokeless tobacco: Never Used  Substance Use Topics  . Alcohol use: Yes    Comment: 1-2 beers a month  . Drug use: Yes    Types: Cocaine     Allergies   Other   Review of Systems Review of Systems  Constitutional: Negative for chills and fever.  Respiratory: Negative for cough, chest tightness and shortness of breath.   Cardiovascular: Negative for chest pain, palpitations and leg swelling.  Gastrointestinal: Negative for abdominal distention, abdominal pain, diarrhea, nausea and vomiting.  Genitourinary: Negative for dysuria, frequency, hematuria and urgency.  Musculoskeletal: Positive for arthralgias, myalgias and neck pain. Negative for neck stiffness.  Skin: Negative for rash.  Allergic/Immunologic: Negative for immunocompromised state.  Neurological: Positive for dizziness, light-headedness and headaches. Negative for weakness and numbness.  All other systems reviewed and are negative.    Physical Exam Updated Vital Signs BP (!) 147/108 (BP Location: Right Arm)   Pulse 80   Temp 98 F (36.7 C) (Oral)   Resp 17   Ht 5\' 8"  (1.727 m)   Wt 87.1 kg (192 lb)   SpO2 98%   BMI 29.19 kg/m   Physical Exam  Constitutional: He is oriented to person, place, and time. He appears well-developed and well-nourished. No distress.  HENT:  Head: Normocephalic and atraumatic.  Eyes: Conjunctivae are normal.  Neck: Neck supple.  Muscle spasms to the right trapezius noted, tender to palpation.  Full range of motion of the neck.  No midline tenderness.   Cardiovascular: Normal rate, regular rhythm and normal heart sounds.  Pulmonary/Chest: Effort normal. No respiratory distress. He has no wheezes. He has no rales.  Abdominal: Soft. Bowel sounds are normal. He exhibits no distension. There is no tenderness. There is no rebound.  Musculoskeletal: He exhibits no edema.  5 out of 5 and equal upper and lower extremity strength bilaterally.  Walking with a gait.  Neurological: He is alert and oriented to person, place, and time. No cranial nerve deficit.  Skin: Skin is warm and dry.  Nursing note and vitals reviewed.    ED Treatments / Results  Labs (all labs ordered are listed, but only abnormal results are displayed) Labs Reviewed - No data to display  EKG None  Radiology No results found.  Procedures Procedures (including  critical care time)  Medications Ordered in ED Medications  ketorolac (TORADOL) 30 MG/ML injection 30 mg (30 mg Intramuscular Given 06/08/18 2232)     Initial Impression / Assessment and Plan / ED Course  I have reviewed the triage vital signs and the nursing notes.  Pertinent labs & imaging results that were available during my care of the patient were reviewed by me and considered in my medical decision making (see chart for details).     Patient is here complaining of chronic neck and head pain.  No injuries.  No fever or chills.  No numbness or weakness in extremities that is new.  He states pain has been there for months and he just needs some pain pills.  Exam is really unremarkable.  Patient is belligerent, cursing at everybody that has tried to talk to him.  He is requesting something for pain.  I will give him some Toradol here.  I had a long discussion with him about how we did not treat chronic conditions in the emergency department.  He already has muscle relaxants which is something I would be happy to prescribe him.  I advised him to take Tylenol.  I advised him to use heating pads and stretches.  I  did tell him that he must follow-up with a family doctor for further evaluation and treatment of his chronic pain.  He may benefit from some physical therapy or massage therapy.  Patient disagreed, stating that the only pain pills will help.  Again I reassured him that he will not be approved getting an opiate prescription.  We will have him follow-up with his doctor.  Return precautions discussed.  Vitals:   06/08/18 2101 06/08/18 2233  BP: (!) 147/108 (!) 166/114  Pulse: 80 65  Resp: 17 18  Temp: 98 F (36.7 C)   TempSrc: Oral   SpO2: 98% 98%  Weight: 87.1 kg (192 lb)   Height: 5\' 8"  (1.727 m)     Final Clinical Impressions(s) / ED Diagnoses   Final diagnoses:  Chronic nonintractable headache, unspecified headache type  Muscle spasms of neck    ED Discharge Orders    None       Jeannett Senior, PA-C 06/08/18 2238    Daleen Bo, MD 06/09/18 [phone]

## 2018-06-08 NOTE — ED Notes (Signed)
ED Provider at bedside. 

## 2018-06-08 NOTE — ED Notes (Signed)
Went over discharge instructions and follow up care with Pt. Pt was unwilling to leave and was standing at nurses station continuing to states "medical professional are incompetent" Pt escorted out of facility by security

## 2018-06-08 NOTE — Discharge Instructions (Addendum)
Take Tylenol for your headache.  You can also try Excedrin Migraine.  Continue your muscle relaxants.  Try heat to your neck.  Stretching.  Follow-up with a family doctor.  This is a chronic condition and we do not supply pain medications for this type of problem

## 2018-06-08 NOTE — ED Notes (Signed)
Patient has been very belligerent to all staff who has interacted with him.

## 2018-06-08 NOTE — ED Notes (Signed)
Patient threatening to leave if not seen by a MD "soon". Writer thanked the patient for waiting and that it would be best to stay for MD to see the patient while they were here, instead of coming back for a different hospital visit. RN is aware.

## 2018-06-08 NOTE — ED Triage Notes (Signed)
Patient is complaining of a knot on the right neck pain that has not been resolved. Patient states that he is having chronic headaches.

## 2018-07-25 ENCOUNTER — Encounter: Payer: Self-pay | Admitting: Family Medicine

## 2018-07-25 ENCOUNTER — Ambulatory Visit: Payer: Medicaid Other | Attending: Family Medicine | Admitting: Family Medicine

## 2018-07-25 VITALS — BP 149/98 | HR 64 | Temp 98.9°F | Resp 18 | Ht 68.0 in | Wt 188.2 lb

## 2018-07-25 DIAGNOSIS — Z9889 Other specified postprocedural states: Secondary | ICD-10-CM | POA: Diagnosis not present

## 2018-07-25 DIAGNOSIS — E785 Hyperlipidemia, unspecified: Secondary | ICD-10-CM

## 2018-07-25 DIAGNOSIS — M25562 Pain in left knee: Secondary | ICD-10-CM | POA: Diagnosis not present

## 2018-07-25 DIAGNOSIS — I10 Essential (primary) hypertension: Secondary | ICD-10-CM

## 2018-07-25 DIAGNOSIS — Z79899 Other long term (current) drug therapy: Secondary | ICD-10-CM | POA: Diagnosis not present

## 2018-07-25 DIAGNOSIS — R519 Headache, unspecified: Secondary | ICD-10-CM

## 2018-07-25 DIAGNOSIS — F17210 Nicotine dependence, cigarettes, uncomplicated: Secondary | ICD-10-CM | POA: Diagnosis not present

## 2018-07-25 DIAGNOSIS — G8929 Other chronic pain: Secondary | ICD-10-CM

## 2018-07-25 DIAGNOSIS — Z818 Family history of other mental and behavioral disorders: Secondary | ICD-10-CM | POA: Insufficient documentation

## 2018-07-25 DIAGNOSIS — M62838 Other muscle spasm: Secondary | ICD-10-CM | POA: Diagnosis not present

## 2018-07-25 DIAGNOSIS — F259 Schizoaffective disorder, unspecified: Secondary | ICD-10-CM | POA: Insufficient documentation

## 2018-07-25 DIAGNOSIS — Z23 Encounter for immunization: Secondary | ICD-10-CM | POA: Diagnosis not present

## 2018-07-25 DIAGNOSIS — R51 Headache: Secondary | ICD-10-CM | POA: Diagnosis not present

## 2018-07-25 DIAGNOSIS — M5440 Lumbago with sciatica, unspecified side: Secondary | ICD-10-CM

## 2018-07-25 DIAGNOSIS — I129 Hypertensive chronic kidney disease with stage 1 through stage 4 chronic kidney disease, or unspecified chronic kidney disease: Secondary | ICD-10-CM | POA: Insufficient documentation

## 2018-07-25 DIAGNOSIS — M545 Low back pain, unspecified: Secondary | ICD-10-CM

## 2018-07-25 DIAGNOSIS — N183 Chronic kidney disease, stage 3 unspecified: Secondary | ICD-10-CM

## 2018-07-25 DIAGNOSIS — Z8249 Family history of ischemic heart disease and other diseases of the circulatory system: Secondary | ICD-10-CM | POA: Insufficient documentation

## 2018-07-25 DIAGNOSIS — Z888 Allergy status to other drugs, medicaments and biological substances status: Secondary | ICD-10-CM | POA: Insufficient documentation

## 2018-07-25 DIAGNOSIS — R42 Dizziness and giddiness: Secondary | ICD-10-CM

## 2018-07-25 DIAGNOSIS — Z841 Family history of disorders of kidney and ureter: Secondary | ICD-10-CM | POA: Insufficient documentation

## 2018-07-25 DIAGNOSIS — I6930 Unspecified sequelae of cerebral infarction: Secondary | ICD-10-CM

## 2018-07-25 DIAGNOSIS — Z720 Tobacco use: Secondary | ICD-10-CM

## 2018-07-25 MED ORDER — AMLODIPINE BESYLATE 10 MG PO TABS: 10.0000 mg | ORAL_TABLET | Freq: Every day | ORAL | 6 refills | Status: DC

## 2018-07-25 MED ORDER — DICLOFENAC SODIUM 1 % TD GEL: TRANSDERMAL | 4 refills | Status: DC

## 2018-07-25 NOTE — Progress Notes (Signed)
Subjective:    Patient ID: Darryl Parsons, male    DOB: [birth year], 62 y.o.   MRN: [id number]  HPI 62 year old male new to the practice.  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  Patient does have a history of hypertension and polysubstance abuse as well as mental health history of schizoaffective disorder.  Patient was also found to have a right lower extremity DVT during his hospitalization but was not a candidate for anticoagulation and therefore had placement of a IVC filter.        At today's visit, patient states that he is out of 1 of his blood pressure medications.  Patient states that he continues to have issues with recurrent headaches either frontal with a pressure sensation behind his eyes or he has pain in his right posterior neck and shoulder that radiates up his scalp above the right ear.  Patient uses a cane to help with gait and balance.  Patient reports history of left tibial fracture when he jumped off of a moving truck.  Patient reports recurrent low back pain and sciatica.  Patient states that he has pain that goes down his left leg which is sharp and tingling.  Patient also with recent onset of pain and swelling in his left knee.  Patient would like a prescription for ibuprofen 800 mg.  Patient states that he has been taking this medication to help with pain.  Patient reports that he has been following up with [facility] mental health but just recently reestablished care.  Patient states that he has been told that he has bipolar but he does not believe he is.  Patient states that he does tend to have mood swings.  Patient reports that he is taking his blood pressure medication and still has his clonidine patches but ran out of amlodipine about 2 weeks ago. Past Medical History:  Diagnosis Date  . Chronic kidney  disease   . Headache   . Hypertension   . Low back pain   . Mental disorder   . Schizophrenia [facility])    Past Surgical History:  Procedure Laterality Date  . IR IVC FILTER PLMT / S&I /IMG GUID/MOD SED  03/10/2017   Family History  Problem Relation Age of Onset  . Cardiomyopathy Sister   . Lupus Sister   . Kidney failure Sister   . Factor VIII deficiency Neg Hx   . Mental illness Neg Hx    Social History   Tobacco Use  . Smoking status: Current Every Day Smoker    Packs/day: 0.50    Types: Cigarettes  . Smokeless tobacco: Never Used  Substance Use Topics  . Alcohol use: Yes    Comment: 1-2 beers a month  . Drug use: Yes    Types: Cocaine   Allergies  Allergen Reactions  . Other     Pt states he is allergic to a blood pressure pill. He is unable to state reaction. He is spelling it adelatt and aderlatt.    Review of Systems  Constitutional: Positive for fatigue. Negative for chills and fever.  HENT: Negative for sore throat and trouble swallowing.   Respiratory: Negative for cough and shortness of breath.   Cardiovascular: Positive for chest pain (Occasional brief substernal chest discomfort). Negative for palpitations and leg swelling.  Gastrointestinal: Negative for abdominal pain and nausea.  Genitourinary: Negative for dysuria and frequency.  Musculoskeletal: Positive for arthralgias, back pain, myalgias, neck pain and neck stiffness. Negative for gait problem.  Neurological: Positive for headaches. Negative for dizziness.       Issues with balance  Psychiatric/Behavioral: Negative for suicidal ideas. The patient is nervous/anxious.        Objective:   Physical Exam  BP (!) 149/98 (BP Location: Left Arm, Patient Position: Sitting, Cuff Size: Large)   Pulse 64   Temp 98.9 F (37.2 C) (Oral)   Resp 18   Ht 5\' 8"  (1.727 m)   Wt 188 lb 3.2 oz (85.4 kg)   SpO2 96%   BMI 28.62 kg/m  Vital signs and nursing note reviewed General- well-nourished, well-developed  older male in no acute distress.  Patient uses a cane with ambulation and holds on to cane as well when getting onto the exam table ENT- TMs gray, nares with mild edema of the nasal turbinates, normal oropharynx Neck-supple, no lymphadenopathy, no thyromegaly, no carotid bruit Lungs-clear to auscultation bilaterally. Cardiovascular-regular rate and rhythm Abdomen-soft, nontender Back- no CVA tenderness.  Patient does have midline and bilateral lumbar tenderness to palpation.  Patient with tenderness and muscle spasm of the right trapezius muscle and right upper cervical paraspinous spasm Musculoskeletal- patient with some mild generalized edema of the left knee and patient with left lateral joint line tenderness to palpation and complained of some discomfort left superior medial aspect of the knee Neuro- cranial nerves II through XII are grossly intact Psych- patient with normal mood but judgment appears to be slightly impaired.  Patient with some nonsensical statements        Assessment & Plan:  1. Essential hypertension Patient's blood pressure slightly elevated at today's visit.  Patient is provided for new prescription for refill of his amlodipine as he states that he has been out of this medication.  Patient will continue amlodipine as well as clonidine patch for control of blood pressure.  Patient will have CMP in follow-up of hypertension and medication use - Comprehensive metabolic panel - amLODipine (NORVASC) 10 MG tablet; Take 1 tablet (10 mg total) by mouth daily.  Dispense: 30 tablet; Refill: 6  2. Dizziness Patient with complaint of dizziness.  Patient per review of records has had chronic issues with dizziness status post CVA in April 2019.  We will also check CBC and CMP to look for any anemia or electrolyte abnormality which may be contributing to his dizziness.  Patient is encouraged to continue use of his cane to help with his balance - CBC with Differential  3. Chronic  nonintractable headache, unspecified headache type Patient with complaint of chronic headache and cephalgia status post CVA.  Patient is to continue follow-up with his neurologist.  4. History of CVA with residual deficit Patient with history of left PICA territory infarct with residual chronic dizziness and headache.  Encouraged neurology follow-up  5. Acute pain of left knee Patient with report of acute left knee pain with swelling.  Patient will be given prescription for Voltaren gel to use as needed.  Patient is also being referred to orthopedics for further evaluation.  Patient was unaware of past elevated creatinine.  Patient was encouraged to stop the use of over-the-counter ibuprofen which he has been taking for pain. - diclofenac sodium (VOLTAREN) 1 % GEL; Apply up to 4 times daily to painful areas  Dispense: 2 Tube; Refill: 4 - Ambulatory referral to Orthopedic Surgery  6. Low back pain with radiation  Patient with complaint of low back pain with radiation.  Patient is not sure if he has tried gabapentin in the past.  Patient does have prescriptions for muscle relaxants which he can take as needed and patient can also try the use of the Voltaren gel which was prescribed for knee pain.  7. Stage III chronic kidney disease ([place]) On review of patient's chart, patient has had prior creatinine done in February of this year that was elevated at 1.87.  Patient will have blood work to look for current creatinine level as well as CBC to look for any anemia of renal disease.  Patient is encouraged to make sure that his blood pressure remains controlled but also avoid the use of nonsteroidal anti-inflammatories and to remain hydrated - Comprehensive metabolic panel - CBC with Differential  8. Dyslipidemia Patient with history of dyslipidemia.  Patient is not fasting at today's visit.  Patient is encouraged to continue the use of Lipitor  9. Encounter for long-term current use of  medication Patient will have CMP and CBC done at today's visit and follow-up of long-term use of medications for treatment of hypertension, prior CVA and chronic pain/muscle spasm - Comprehensive metabolic panel - CBC with Differential  10. Muscle spasm of shoulder region Patient with muscle spasm of the right shoulder/neck area.  Patient per med list does have muscle relaxants including Robaxin and Flexeril to take as needed.  Also encouraged warm moist heat.  11. Tobacco use Patient was continued tobacco use and patient is encouraged to completely stop smoking.  12.  Need for pneumococcal vaccination Patient requested and received pneumococcal immunization-PPV 23 at today's visit.  An After Visit Summary was printed and given to the patient.  Return in about 8 weeks (around 09/19/2018) for Blood pressure/chronic medical problems.

## 2018-07-26 ENCOUNTER — Telehealth: Payer: Self-pay | Admitting: Family Medicine

## 2018-07-26 LAB — CBC WITH DIFFERENTIAL/PLATELET
Basophils Absolute: 0 x10E3/uL (ref 0.0–0.2)
Basos: 0 %
EOS (ABSOLUTE): 0.1 x10E3/uL (ref 0.0–0.4)
Eos: 1 %
Hematocrit: 41.3 % (ref 37.5–51.0)
Hemoglobin: 13.7 g/dL (ref 13.0–17.7)
Immature Grans (Abs): 0 x10E3/uL (ref 0.0–0.1)
Immature Granulocytes: 0 %
Lymphocytes Absolute: 2.6 x10E3/uL (ref 0.7–3.1)
Lymphs: 36 %
MCH: 28 pg (ref 26.6–33.0)
MCHC: 33.2 g/dL (ref 31.5–35.7)
MCV: 85 fL (ref 79–97)
Monocytes Absolute: 0.4 x10E3/uL (ref 0.1–0.9)
Monocytes: 6 %
Neutrophils Absolute: 4.1 x10E3/uL (ref 1.4–7.0)
Neutrophils: 57 %
Platelets: 256 x10E3/uL (ref 150–450)
RBC: 4.89 x10E6/uL (ref 4.14–5.80)
RDW: 15.3 % (ref 12.3–15.4)
WBC: 7.3 x10E3/uL (ref 3.4–10.8)

## 2018-07-26 LAB — COMPREHENSIVE METABOLIC PANEL WITH GFR
ALT: 12 IU/L (ref 0–44)
AST: 16 IU/L (ref 0–40)
Albumin/Globulin Ratio: 1.6 (ref 1.2–2.2)
Albumin: 4.5 g/dL (ref 3.6–4.8)
Alkaline Phosphatase: 68 IU/L (ref 39–117)
BUN/Creatinine Ratio: 8 — ABNORMAL LOW (ref 10–24)
BUN: 15 mg/dL (ref 8–27)
Bilirubin Total: 0.6 mg/dL (ref 0.0–1.2)
CO2: 21 mmol/L (ref 20–29)
Calcium: 9.4 mg/dL (ref 8.6–10.2)
Chloride: 103 mmol/L (ref 96–106)
Creatinine, Ser: 1.77 mg/dL — ABNORMAL HIGH (ref 0.76–1.27)
GFR calc Af Amer: 47 mL/min/1.73 — ABNORMAL LOW
GFR calc non Af Amer: 41 mL/min/1.73 — ABNORMAL LOW
Globulin, Total: 2.9 g/dL (ref 1.5–4.5)
Glucose: 84 mg/dL (ref 65–99)
Potassium: 4 mmol/L (ref 3.5–5.2)
Sodium: 140 mmol/L (ref 134–144)
Total Protein: 7.4 g/dL (ref 6.0–8.5)

## 2018-07-26 NOTE — Telephone Encounter (Signed)
Mickel Baas from [facility] called for clarification regarding the Voltaren prescription regarding the grams he is suppose to use. Please follow up with the pharmacy as well as the patient.

## 2018-07-26 NOTE — Telephone Encounter (Signed)
Please contact pharmacy with instructions that patient should receive Voltaren gel, 2 tubes per 30-day to apply 2 g to left knee up to 4 times daily as needed for pain.  If his insurance will cover for tubes for 4 g dose 4 times daily then please see if pharmacy will dispense 4 tubes for 4 g dose to be applied to left knee 4 times daily with 3 refills.  Please notify patient after speaking with pharmacy.

## 2018-07-27 NOTE — Telephone Encounter (Signed)
Please contact pharmacy as per prior message

## 2018-07-27 NOTE — Telephone Encounter (Signed)
Spoke with Fritz Pickerel from [employer], orders given, verbalized understanding, no further questions.

## 2018-08-02 ENCOUNTER — Telehealth: Payer: Self-pay

## 2018-08-02 NOTE — Telephone Encounter (Signed)
Patient was called, no answer, lvm to return call.

## 2018-08-02 NOTE — Telephone Encounter (Signed)
-----   Message from Antony Blackbird, MD sent at 07/26/2018  3:18 PM EDT ----- Please notify patient that his labs show that he does have stage III chronic kidney disease.  Patient should try to avoid the use of ibuprofen or naproxen for pain.  Patient should make sure that his blood pressure remains controlled.  Patient's creatinine was 1.77 which is slightly improved from the creatinine of 1.87 in February of this year but it appears that his creatinine has been elevated as far back as blood work done in May 2018 when his creatinine was 1.72.

## 2018-09-19 ENCOUNTER — Ambulatory Visit: Payer: Self-pay | Admitting: Family Medicine

## 2018-09-28 ENCOUNTER — Ambulatory Visit: Payer: Self-pay | Admitting: Family Medicine

## 2018-11-16 ENCOUNTER — Ambulatory Visit: Payer: Self-pay | Admitting: Family Medicine

## 2018-12-11 ENCOUNTER — Ambulatory Visit: Payer: Self-pay | Admitting: Family Medicine

## 2019-04-12 ENCOUNTER — Ambulatory Visit: Payer: Medicaid Other | Admitting: Family Medicine

## 2019-04-12 ENCOUNTER — Other Ambulatory Visit: Payer: Self-pay

## 2019-04-20 ENCOUNTER — Ambulatory Visit: Payer: Medicaid Other | Attending: Family Medicine | Admitting: Family Medicine

## 2019-04-20 ENCOUNTER — Other Ambulatory Visit: Payer: Self-pay

## 2019-04-23 ENCOUNTER — Ambulatory Visit: Payer: Medicaid Other | Attending: Internal Medicine | Admitting: Internal Medicine

## 2019-06-21 ENCOUNTER — Telehealth: Payer: Self-pay | Admitting: Family Medicine

## 2019-06-21 ENCOUNTER — Ambulatory Visit: Payer: Medicaid Other | Admitting: Family Medicine

## 2019-06-21 NOTE — Telephone Encounter (Signed)
1) Medication(s) Requested (by name): methocarbamol (ROBAXIN) 750 MG tablet [[id number]]  hydrochlorothiazide (HYDRODIURIL) 50 MG tablet [[id number]]  diclofenac sodium (VOLTAREN) 1 % GEL [[id number]]    2) Pharmacy of Choice: [facility] on [address]   3) Special Requests:   Approved medications will be sent to the pharmacy, we will reach out if there is an issue.  Requests made after 3pm may not be addressed until the following business day!  If a patient is unsure of the name of the medication(s) please note and ask patient to call back when they are able to provide all info, do not send to responsible party until all information is available!

## 2019-06-21 NOTE — Telephone Encounter (Signed)
Pt is requesting tylonol 3 for pain, next available appt was on 9/2 so I went ahead and resch mr.Freel for an appt since he no showed on 08/06.Darryl Kitchenplease follow up

## 2019-06-25 ENCOUNTER — Other Ambulatory Visit: Payer: Self-pay | Admitting: Family Medicine

## 2019-06-25 DIAGNOSIS — I10 Essential (primary) hypertension: Secondary | ICD-10-CM

## 2019-06-25 DIAGNOSIS — M25562 Pain in left knee: Secondary | ICD-10-CM

## 2019-06-25 DIAGNOSIS — M545 Low back pain, unspecified: Secondary | ICD-10-CM

## 2019-06-25 MED ORDER — DICLOFENAC SODIUM 1 % TD GEL: TRANSDERMAL | 3 refills | Status: DC

## 2019-06-25 MED ORDER — HYDROCHLOROTHIAZIDE 50 MG PO TABS: 50.0000 mg | ORAL_TABLET | Freq: Every day | ORAL | 1 refills | Status: DC

## 2019-06-25 MED ORDER — METHOCARBAMOL 750 MG PO TABS: ORAL_TABLET | ORAL | 0 refills | Status: DC

## 2019-06-25 MED FILL — DICLOFENAC SODIUM 1% GEL: 1 | 6 days supply | Qty: 100 | Fill #0

## 2019-06-25 MED FILL — METHOCARBAMOL 750 MG TABS: 750 | 30 days supply | Qty: 90 | Fill #0

## 2019-06-25 NOTE — Progress Notes (Signed)
Patient ID: Darryl Parsons, male   DOB: [birth year], 63 y.o.   MRN: [id number]   Refill request received for Robaxin 750 mg, HCTZ 50 mg and diclofenac gel.  Patient has follow-up appointment scheduled for September 4.  Medications will be sent to pharmacy.

## 2019-06-25 NOTE — Telephone Encounter (Signed)
Medications were refilled.  

## 2019-06-26 NOTE — Telephone Encounter (Signed)
LMOM

## 2019-06-28 NOTE — Telephone Encounter (Signed)
noted 

## 2019-07-07 IMAGING — CR DG CHEST 2V
2 series · 2 of 2 positions shown · non-contrast
Comparison: March 10, 2017

CLINICAL DATA: Intermittent chest pain

EXAM:
CHEST  2 VIEW

[w chest pa]
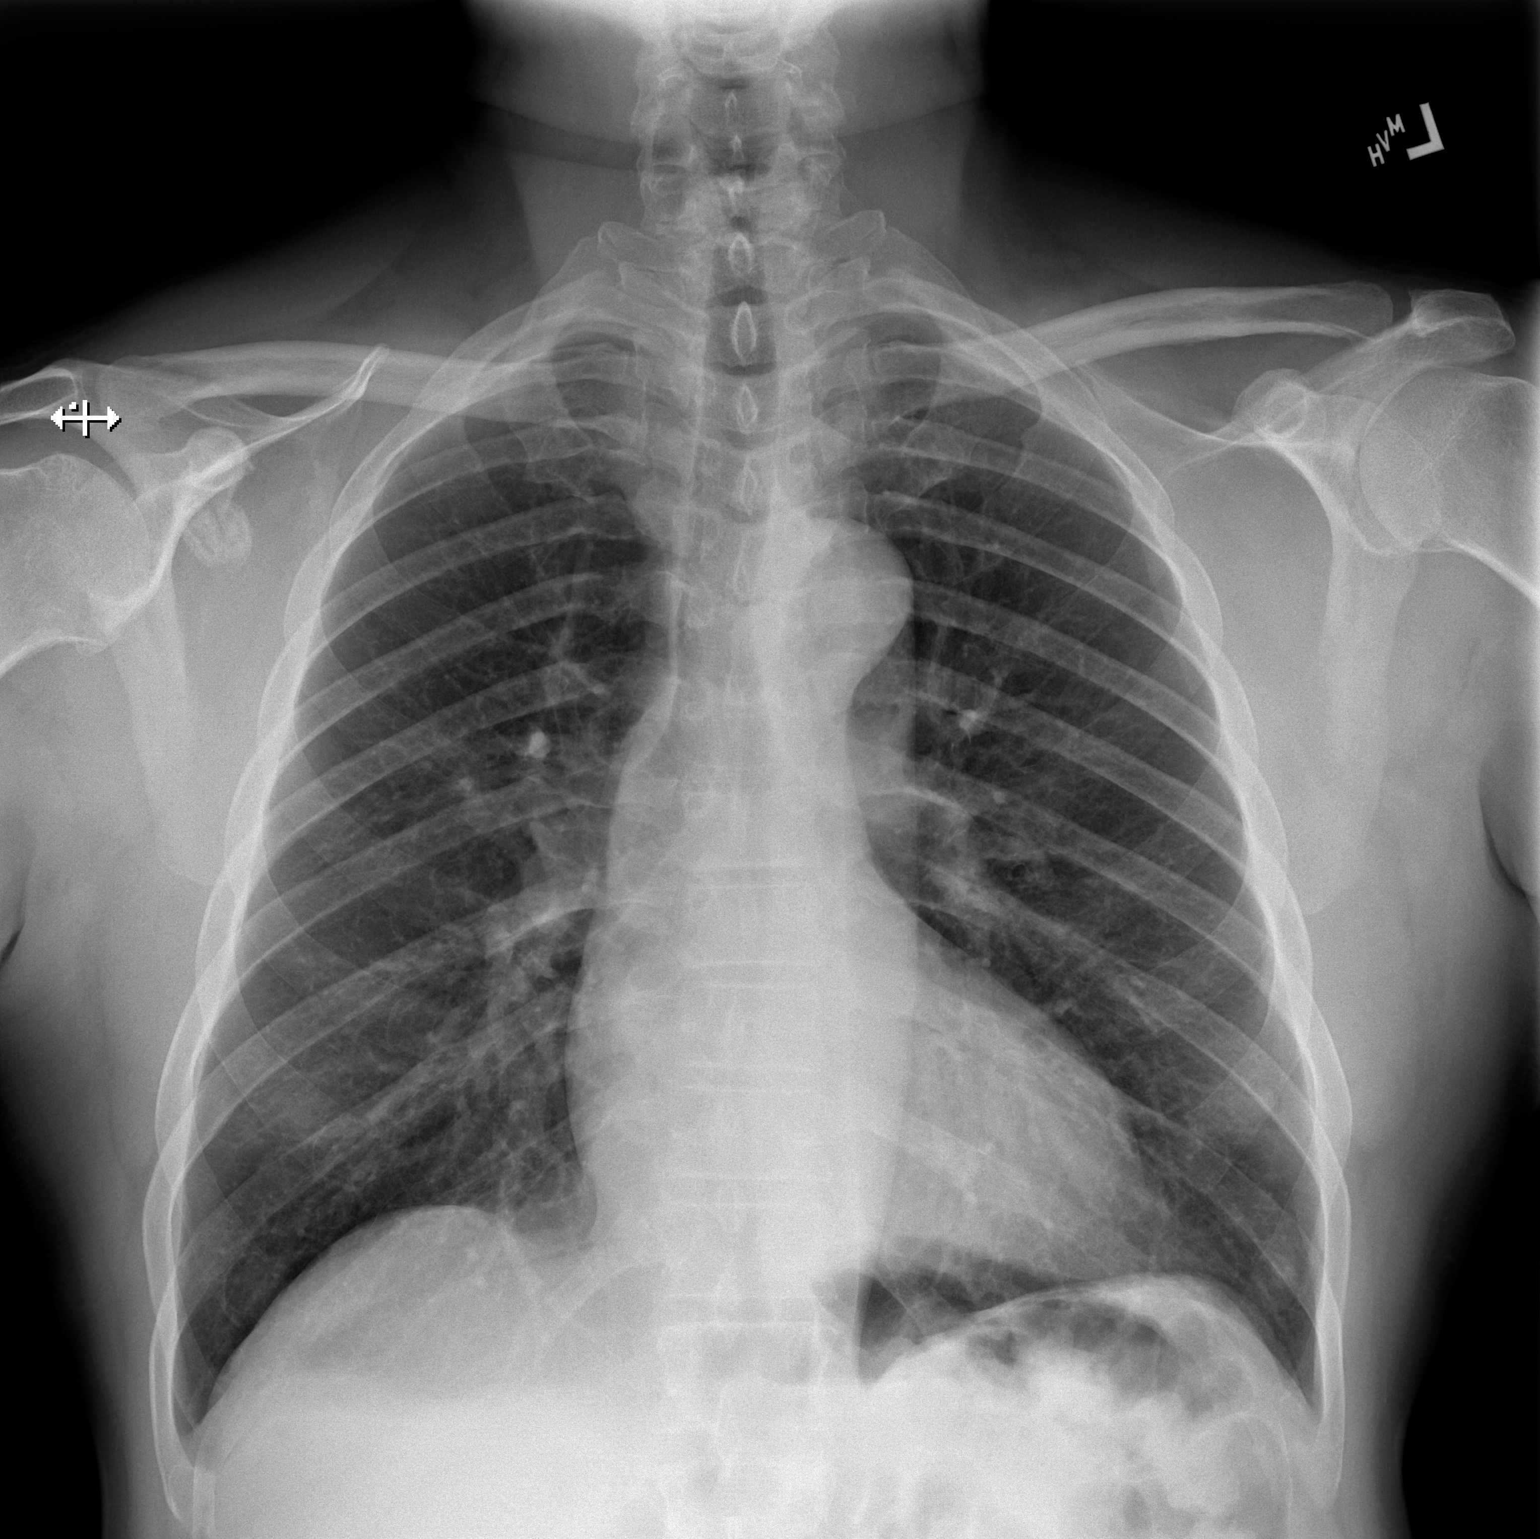

[w chest lat]
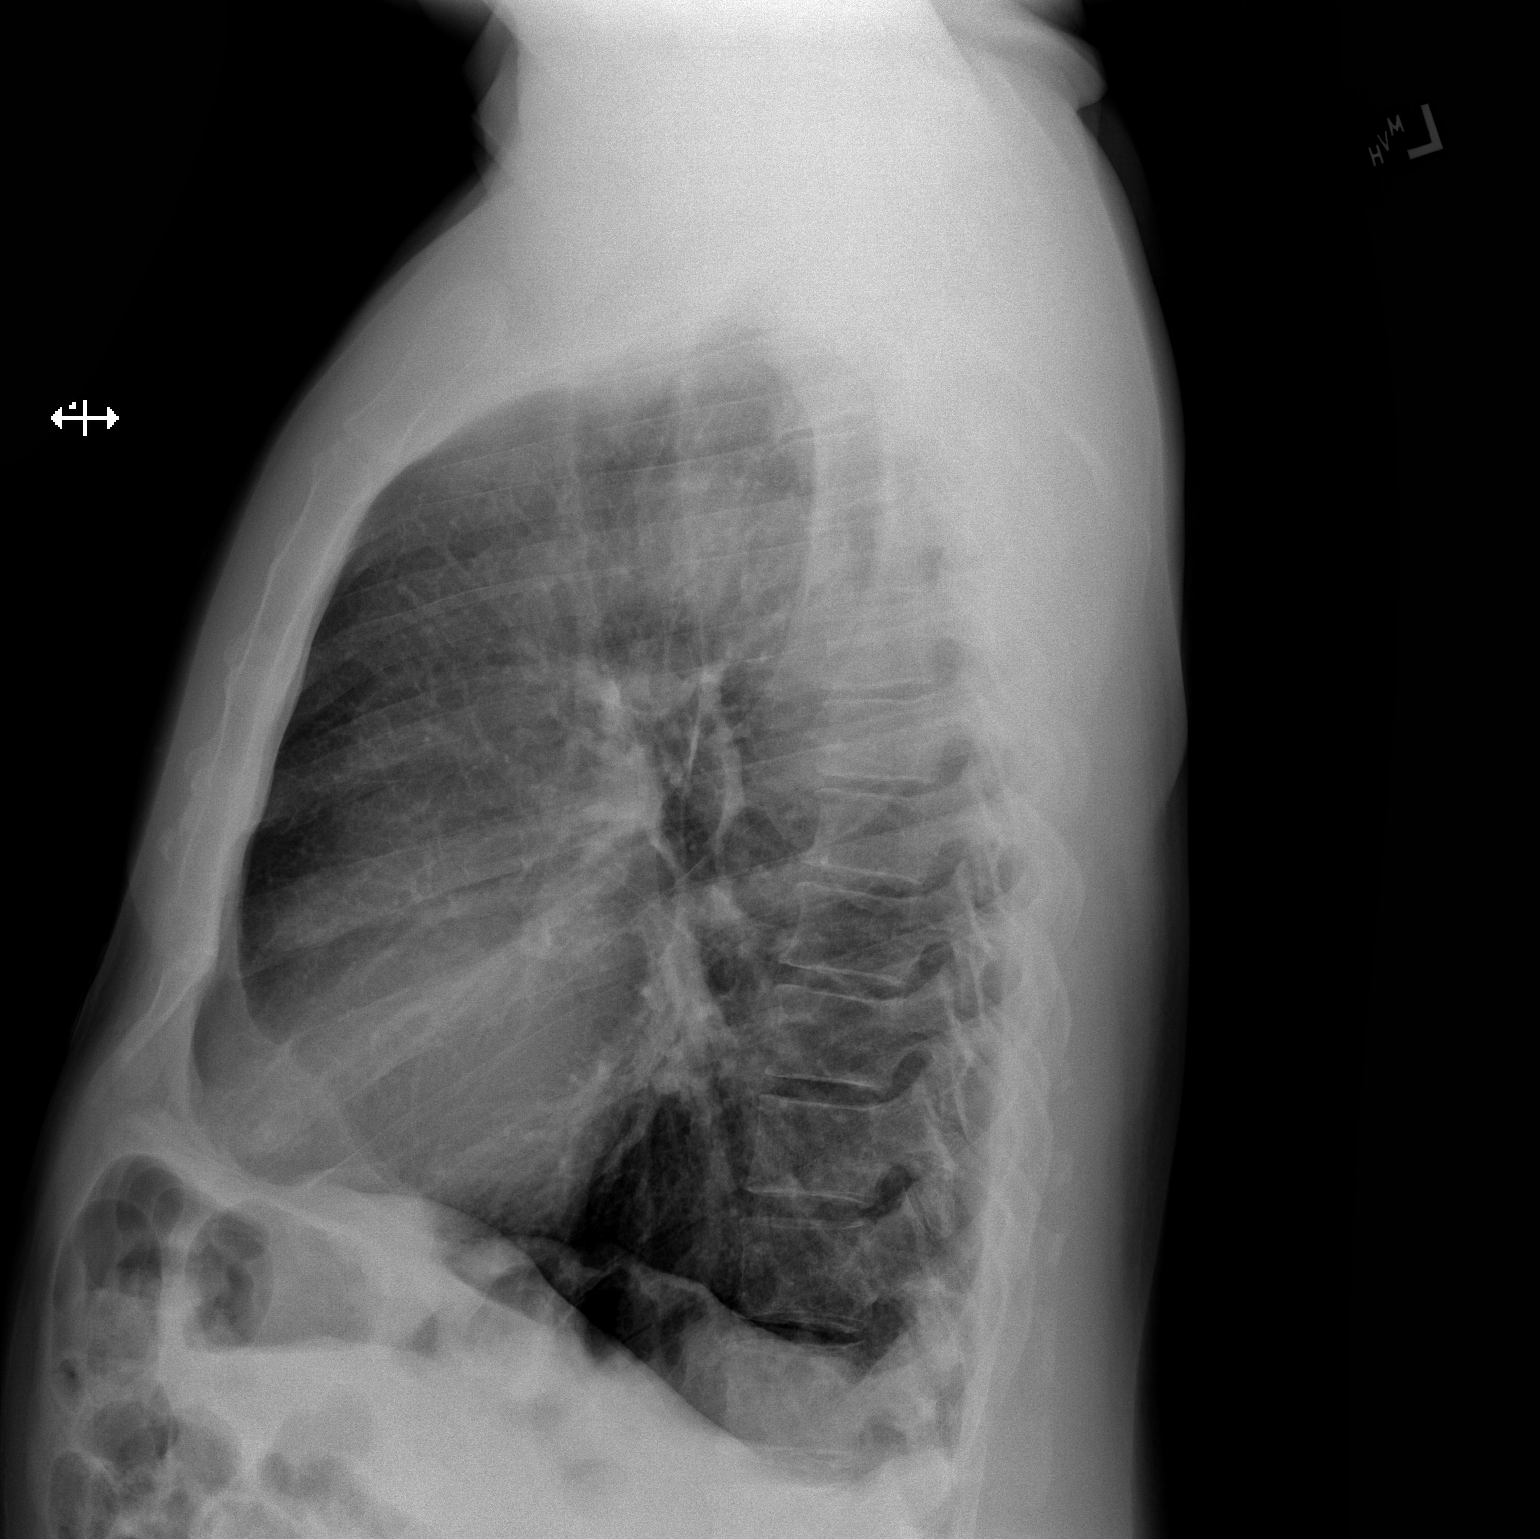

[2 of 2 positions shown; findings below may reference images not displayed]

FINDINGS: The heart size and mediastinal contours are within normal limits.
Both lungs are clear. The visualized skeletal structures are
unremarkable.
IMPRESSION: No active cardiopulmonary disease.

## 2019-07-18 ENCOUNTER — Ambulatory Visit: Payer: Medicaid Other | Admitting: Family Medicine

## 2019-07-25 ENCOUNTER — Other Ambulatory Visit: Payer: Self-pay

## 2019-07-25 ENCOUNTER — Ambulatory Visit: Payer: Medicaid Other | Attending: Family Medicine | Admitting: Physician Assistant

## 2019-07-25 VITALS — BP 136/90 | HR 62 | Ht 68.0 in | Wt 185.8 lb

## 2019-07-25 DIAGNOSIS — M5440 Lumbago with sciatica, unspecified side: Secondary | ICD-10-CM

## 2019-07-25 DIAGNOSIS — N184 Chronic kidney disease, stage 4 (severe): Secondary | ICD-10-CM

## 2019-07-25 DIAGNOSIS — I6930 Unspecified sequelae of cerebral infarction: Secondary | ICD-10-CM

## 2019-07-25 DIAGNOSIS — Z79899 Other long term (current) drug therapy: Secondary | ICD-10-CM | POA: Diagnosis not present

## 2019-07-25 DIAGNOSIS — Z7982 Long term (current) use of aspirin: Secondary | ICD-10-CM | POA: Diagnosis not present

## 2019-07-25 DIAGNOSIS — M545 Low back pain, unspecified: Secondary | ICD-10-CM

## 2019-07-25 DIAGNOSIS — I10 Essential (primary) hypertension: Secondary | ICD-10-CM

## 2019-07-25 DIAGNOSIS — Z8673 Personal history of transient ischemic attack (TIA), and cerebral infarction without residual deficits: Secondary | ICD-10-CM | POA: Insufficient documentation

## 2019-07-25 DIAGNOSIS — I129 Hypertensive chronic kidney disease with stage 1 through stage 4 chronic kidney disease, or unspecified chronic kidney disease: Secondary | ICD-10-CM | POA: Diagnosis present

## 2019-07-25 MED ORDER — ATORVASTATIN CALCIUM 40 MG PO TABS: 40.0000 mg | ORAL_TABLET | Freq: Every day | ORAL | 3 refills | Status: DC

## 2019-07-25 MED ORDER — METHOCARBAMOL 750 MG PO TABS: ORAL_TABLET | ORAL | 0 refills | Status: DC

## 2019-07-25 MED ORDER — HYDROCHLOROTHIAZIDE 50 MG PO TABS: 50.0000 mg | ORAL_TABLET | Freq: Every day | ORAL | 1 refills | Status: DC

## 2019-07-25 MED ORDER — AMLODIPINE BESYLATE 10 MG PO TABS: 10.0000 mg | ORAL_TABLET | Freq: Every day | ORAL | 6 refills | Status: DC

## 2019-07-25 MED ORDER — ATORVASTATIN CALCIUM 40 MG PO TABS: 40.0000 mg | ORAL_TABLET | Freq: Every day | ORAL | 0 refills | Status: DC

## 2019-07-25 MED ORDER — DICLOFENAC SODIUM 1 % TD GEL: TRANSDERMAL | 3 refills | Status: DC

## 2019-07-25 NOTE — Patient Instructions (Signed)
You will need to discuss whether Viagra is a healthy choice for you given all your health conditions with your primary care doctor

## 2019-07-25 NOTE — Progress Notes (Signed)
Patient ID: Darryl Parsons, male   DOB: [birth year], 63 y.o.   MRN: [id number]   Darryl Parsons, is a 63 y.o. male  EGB:[id number]  [id number]  DOB - 02-20-1956  Subjective:  Chief Complaint and HPI: Darryl Parsons is a 63 y.o. male here today with multiple issues and requests.  He starts off by telling me all of medicine is bogus.  He was more than an hour late for his appointment.  Demanded Viagra and tylenol #3.  Has not seen his PCP in over a year and has not been prescribed Viagra in our office.  He has a h/o stroke about 1.5 years ago.  He denies h/o MI.  Also tells me he needs a "wellness" letter, but it is unclear exactly what he needs.  Out of all of his prescription meds.    ROS:   Constitutional:  No f/c, No night sweats, No unexplained weight loss. EENT:  No vision changes, No blurry vision, No hearing changes. No mouth, throat, or ear problems.  Respiratory: No cough, No SOB Cardiac: No CP, no palpitations GI:  No abd pain, No N/V/D. GU: No Urinary s/sx Musculoskeletal: chronic back pain Neuro: No headache, no dizziness, no motor weakness.  Skin: No rash Endocrine:  No polydipsia. No polyuria.  Psych: Denies SI/HI  No problems updated.  ALLERGIES: Allergies  Allergen Reactions  . Other     Pt states he is allergic to a blood pressure pill. He is unable to state reaction. He is spelling it adelatt and aderlatt.    PAST MEDICAL HISTORY: Past Medical History:  Diagnosis Date  . Chronic kidney disease   . Headache   . Hypertension   . Low back pain   . Mental disorder   . Schizophrenia ([place])     MEDICATIONS AT HOME: Prior to Admission medications   Medication Sig Start Date End Date Taking? Authorizing Provider  amLODipine (NORVASC) 10 MG tablet Take 1 tablet (10 mg total) by mouth daily. 07/25/19  Yes Argentina Donovan, PA-C  aspirin 325 MG tablet Take 1 tablet (325 mg total) by mouth daily. 03/21/17  Yes Ghimire, Henreitta Leber, MD  cyclobenzaprine (FLEXERIL) 5  MG tablet Take 5 mg by mouth 3 (three) times daily as needed for muscle spasms.   Yes [provider]  diclofenac sodium (VOLTAREN) 1 % GEL Apply up to 4 times daily to painful areas 07/25/19  Yes Amra Shukla M, PA-C  hydrochlorothiazide (HYDRODIURIL) 50 MG tablet Take 1 tablet (50 mg total) by mouth daily. 07/25/19  Yes Denesha Brouse, Dionne Bucy, PA-C  methocarbamol (ROBAXIN) 750 MG tablet TAKE ONE TABLET BY MOUTH THREE TIMES DAILY as needed for muscle spasm 07/25/19  Yes Argentina Donovan, PA-C  thiamine 100 MG tablet Take 1 tablet (100 mg total) by mouth daily. 09/01/16  Yes Withrow, Elyse Jarvis, FNP  atorvastatin (LIPITOR) 40 MG tablet Take 1 tablet (40 mg total) by mouth daily. 07/25/19   Argentina Donovan, PA-C     Objective:  EXAM:   Vitals:   07/25/19 1522 07/25/19 1523  BP: (!) 145/105 136/90  Pulse: 62   SpO2: 97%   Weight: 185 lb 12.8 oz (84.3 kg)   Height: 5\' 8"  (1.727 m)     General appearance : A&OX3. NAD. Non-toxic-appearing HEENT: Atraumatic and Normocephalic.  PERRLA. EOM intact. Neck: supple, no JVD. No cervical lymphadenopathy. No thyromegaly Chest/Lungs:  Breathing-non-labored, Good air entry bilaterally, breath sounds normal without rales, rhonchi, or wheezing  CVS: S1  S2 regular, no murmurs, gallops, rubs  Extremities: Bilateral Lower Ext shows no edema, both legs are warm to touch with = pulse throughout Neurology:  CN II-XII grossly intact, Non focal.   Psych:  TP is not linear.  clear speech. Changes subjects quickly-difficult to follow Skin:  No Rash  Data Review Lab Results  Component Value Date   HGBA1C <4.2 (L) 03/09/2017   HGBA1C <4.2 (L) 08/28/2016   HGBA1C <4.3 (L) 01/30/2016     Assessment & Plan   1. Essential hypertension Not controlled-out of all meds. Resume meds - CBC with Differential/Platelet - amLODipine (NORVASC) 10 MG tablet; Take 1 tablet (10 mg total) by mouth daily.  Dispense: 30 tablet; Refill: 6 - hydrochlorothiazide (HYDRODIURIL)  50 MG tablet; Take 1 tablet (50 mg total) by mouth daily.  Dispense: 30 tablet; Refill: 1  2. CKD (chronic kidney disease) stage 4, GFR 15-29 ml/min (HCC) - Comprehensive metabolic panel  3. History of CVA with residual deficit - Lipid panel  4. Low back pain with radiation - methocarbamol (ROBAXIN) 750 MG tablet; TAKE ONE TABLET BY MOUTH THREE TIMES DAILY as needed for muscle spasm  Dispense: 90 tablet; Refill: 0 - diclofenac sodium (VOLTAREN) 1 % GEL; Apply up to 4 times daily to painful areas  Dispense: 150 g; Refill: 3   Patient have been counseled extensively about nutrition and exercise  Return in about 1 month (around 08/24/2019) for Dr Chapman Fitch for chronic conditions.  The patient was given clear instructions to go to ER or return to medical center if symptoms don't improve, worsen or new problems develop. The patient verbalized understanding. The patient was told to call to get lab results if they haven't heard anything in the next week.     Freeman Caldron, PA-C [facility] [place], [place]   07/25/2019, 3:39 PM

## 2019-07-26 LAB — CBC WITH DIFFERENTIAL/PLATELET
Basophils Absolute: 0 10*3/uL (ref 0.0–0.2)
Basos: 0 %
EOS (ABSOLUTE): 0.1 10*3/uL (ref 0.0–0.4)
Eos: 2 %
Hematocrit: 37.2 % — ABNORMAL LOW (ref 37.5–51.0)
Hemoglobin: 12.5 g/dL — ABNORMAL LOW (ref 13.0–17.7)
Immature Grans (Abs): 0 10*3/uL (ref 0.0–0.1)
Immature Granulocytes: 0 %
Lymphocytes Absolute: 2.4 10*3/uL (ref 0.7–3.1)
Lymphs: 32 %
MCH: 27.1 pg (ref 26.6–33.0)
MCHC: 33.6 g/dL (ref 31.5–35.7)
MCV: 81 fL (ref 79–97)
Monocytes Absolute: 0.5 10*3/uL (ref 0.1–0.9)
Monocytes: 6 %
Neutrophils Absolute: 4.5 10*3/uL (ref 1.4–7.0)
Neutrophils: 60 %
Platelets: 289 10*3/uL (ref 150–450)
RBC: 4.62 x10E6/uL (ref 4.14–5.80)
RDW: 14.6 % (ref 11.6–15.4)
WBC: 7.5 10*3/uL (ref 3.4–10.8)

## 2019-07-26 LAB — LIPID PANEL
Chol/HDL Ratio: 3.1 ratio (ref 0.0–5.0)
Cholesterol, Total: 175 mg/dL (ref 100–199)
HDL: 56 mg/dL (ref >39)
LDL Chol Calc (NIH): 104 mg/dL — ABNORMAL HIGH (ref 0–99)
Triglycerides: 81 mg/dL (ref 0–149)
VLDL Cholesterol Cal: 15 mg/dL (ref 5–40)

## 2019-07-26 LAB — COMPREHENSIVE METABOLIC PANEL
ALT: 11 IU/L (ref 0–44)
AST: 16 IU/L (ref 0–40)
Albumin/Globulin Ratio: 1.5 (ref 1.2–2.2)
Albumin: 4.3 g/dL (ref 3.8–4.8)
Alkaline Phosphatase: 61 IU/L (ref 39–117)
BUN/Creatinine Ratio: 13 (ref 10–24)
BUN: 24 mg/dL (ref 8–27)
Bilirubin Total: 0.3 mg/dL (ref 0.0–1.2)
CO2: 20 mmol/L (ref 20–29)
Calcium: 9.1 mg/dL (ref 8.6–10.2)
Chloride: 105 mmol/L (ref 96–106)
Creatinine, Ser: 1.79 mg/dL — ABNORMAL HIGH (ref 0.76–1.27)
GFR calc Af Amer: 46 mL/min/{1.73_m2} — ABNORMAL LOW (ref >59)
GFR calc non Af Amer: 40 mL/min/{1.73_m2} — ABNORMAL LOW (ref >59)
Globulin, Total: 2.8 g/dL (ref 1.5–4.5)
Glucose: 94 mg/dL (ref 65–99)
Potassium: 3.9 mmol/L (ref 3.5–5.2)
Sodium: 140 mmol/L (ref 134–144)
Total Protein: 7.1 g/dL (ref 6.0–8.5)

## 2019-08-02 ENCOUNTER — Other Ambulatory Visit: Payer: Self-pay | Admitting: Physician Assistant

## 2019-08-02 DIAGNOSIS — M545 Low back pain, unspecified: Secondary | ICD-10-CM

## 2019-08-24 ENCOUNTER — Other Ambulatory Visit: Payer: Self-pay

## 2019-08-24 ENCOUNTER — Encounter: Payer: Self-pay | Admitting: Family Medicine

## 2019-08-24 ENCOUNTER — Ambulatory Visit: Payer: Medicaid Other | Attending: Family Medicine | Admitting: Family Medicine

## 2019-08-24 DIAGNOSIS — N529 Male erectile dysfunction, unspecified: Secondary | ICD-10-CM

## 2019-08-24 DIAGNOSIS — N183 Chronic kidney disease, stage 3 unspecified: Secondary | ICD-10-CM

## 2019-08-24 DIAGNOSIS — G8929 Other chronic pain: Secondary | ICD-10-CM | POA: Diagnosis not present

## 2019-08-24 DIAGNOSIS — M2550 Pain in unspecified joint: Secondary | ICD-10-CM | POA: Diagnosis not present

## 2019-08-24 MED ORDER — INDOMETHACIN 50 MG PO CAPS: 50.0000 mg | ORAL_CAPSULE | Freq: Two times a day (BID) | ORAL | 1 refills | Status: DC

## 2019-08-24 NOTE — Progress Notes (Signed)
Virtual Visit via Telephone Note  I connected with Darryl Parsons on 08/24/19 at  3:50 PM EDT by telephone and verified that I am speaking with the correct person using two identifiers.   I discussed the limitations, risks, security and privacy concerns of performing an evaluation and management service by telephone and the availability of in person appointments. I also discussed with the patient that there may be a patient responsible charge related to this service. The patient expressed understanding and agreed to proceed.  Patient Location: Home Provider Location: [facility] Others participating in call: none   History of Present Illness:        63 year old male with ongoing medical issues including hypertension, history of CVA, chronic low back pain with radiation, schizoaffective disorder, history of DVT and chronic kidney disease stage III-4 who is status post 07/25/2019 visit with another provider in follow-up of hypertension, chronic kidney disease, history of CVA with residual deficit and low back pain with radiation.  At that visit, per visit note, patient was out of all of his medications for hypertension.  Patient had also demanded prescriptions for Viagra and Tylenol #3.  Provider requested that patient schedule appointment with primary care provider as patient had not been seen in the office since 07/25/2018.          Today, patient states that he is having issues with generalized pain and reports a history of a left shoulder separation.  Patient is requesting a prescription for indomethacin to help with his pain.  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  Patient states that he later saw the same inmate walking without difficulty and patient wanted to know what he had been prescribed and patient states that the other man told him that he was prescribed indomethacin therefore patient would like this  medication since this seem to have cured to the other inmate of his joint pain and allowed the other inmate to walk without difficulty.  Patient would like to have the highest dose available of indomethacin to help with his chronic, multiple joint pain.          Patient also reports that he would like to be prescribed Viagra to help with erectile dysfunction.  He reports that this has been going on for some years.  He has never been prescribed Viagra but has taken Viagra or similar pill in the past.  He denies any dysuria or urinary frequency other than getting up at night at times to urinate but he believes that this is because he drinks a lot of water.  He denies abdominal pain.  He does endorse back pain but back pain has been longstanding and he has radiation of pain down his legs in addition to lower back pain.  He reports that he hurts all over and his pain is always a 10 or higher on a 0-to-10 scale with 0 being no pain and 10 being the worst possible pain.  Past Medical History:  Diagnosis Date  . Chronic kidney disease   . Headache   . Hypertension   . Low back pain   . Mental disorder   . Schizophrenia [facility])     Past Surgical History:  Procedure Laterality Date  . IR IVC FILTER PLMT / S&I /IMG GUID/MOD SED  03/10/2017    Family History  Problem Relation Age of Onset  . Cardiomyopathy Sister   . Lupus Sister   .  Kidney failure Sister   . Factor VIII deficiency Neg Hx   . Mental illness Neg Hx     Social History   Tobacco Use  . Smoking status: Current Every Day Smoker    Packs/day: 0.50    Types: Cigarettes  . Smokeless tobacco: Never Used  Substance Use Topics  . Alcohol use: Yes    Comment: 1-2 beers a month  . Drug use: Yes    Types: Cocaine     Allergies  Allergen Reactions  . Other     Pt states he is allergic to a blood pressure pill. He is unable to state reaction. He is spelling it adelatt and aderlatt.       Observations/Objective: No vital signs or  physical exam conducted as visit was done via telephone  Assessment and Plan: 1. Chronic pain of multiple joints Patient with complaint of pain in multiple joints and requests prescription for indomethacin.  Lengthy discussion with the patient that he has chronic kidney disease and indomethacin is not recommended as this can worsen his kidney function.  Patient believes that this is the only thing that will help his pain and he does not care if it affects his kidneys and would like prescription.  Prescription for indomethacin 50 mg to take twice daily after meal as needed for pain was sent to patient's pharmacy.  Patient will also be referred to nephrology for further evaluation and treatment of his chronic kidney disease. - indomethacin (INDOCIN) 50 MG capsule; Take 1 capsule (50 mg total) by mouth 2 (two) times daily with a meal. If needed for pain  Dispense: 60 capsule; Refill: 1 - Ambulatory referral to Nephrology  2. Stage 3 chronic kidney disease, unspecified whether stage 3a or 3b CKD Discussed with patient that he has chronic kidney disease and should avoid the use of nonsteroidal anti-inflammatories which can worsen his chronic kidney disease.  Patient did have creatinine at his last office visit in September and at that time he had creatinine of 1.79 with GFR of 46.  He additionally had anemia with hemoglobin of 12.3/hematocrit of 37.2 with normal MCV of 81.  He is being referred to nephrology for further evaluation and treatment. -Ambulatory referral to nephrology  3. Erectile dysfunction, unspecified erectile dysfunction type Patient with complaints of erectile dysfunction for which he would like to take Viagra.  He agrees to referral to urology or what ever it will take for him to be able to be prescribed Viagra.  Urology referral placed as I suspect the patient has other urologic symptoms but as he was fixated on Viagra he did not give clear information regarding issues such as nocturia,  incomplete bladder emptying or weakening of the urinary stream. - Ambulatory referral to Urology  Follow Up Instructions:Return in about 4 months (around 12/25/2019) for Chronic medical conditions.    I discussed the assessment and treatment plan with the patient. The patient was provided an opportunity to ask questions and all were answered. The patient agreed with the plan and demonstrated an understanding of the instructions.   The patient was advised to call back or seek an in-person evaluation if the symptoms worsen or if the condition fails to improve as anticipated.  I provided 18 minutes of non-face-to-face time during this encounter.   Antony Blackbird, MD

## 2019-09-04 ENCOUNTER — Encounter: Payer: Self-pay | Admitting: Family Medicine

## 2019-09-12 ENCOUNTER — Other Ambulatory Visit: Payer: Self-pay | Admitting: Physician Assistant

## 2019-09-12 DIAGNOSIS — I10 Essential (primary) hypertension: Secondary | ICD-10-CM

## 2019-10-24 ENCOUNTER — Ambulatory Visit: Payer: Medicaid Other | Admitting: Family Medicine

## 2019-11-16 ENCOUNTER — Other Ambulatory Visit: Payer: Self-pay | Admitting: Physician Assistant

## 2019-12-19 ENCOUNTER — Other Ambulatory Visit: Payer: Self-pay

## 2019-12-19 ENCOUNTER — Ambulatory Visit: Payer: Medicaid Other | Attending: Family Medicine | Admitting: Family Medicine

## 2019-12-19 ENCOUNTER — Encounter: Payer: Self-pay | Admitting: Family Medicine

## 2019-12-19 VITALS — BP 160/112 | HR 53 | Resp 16 | Wt 190.2 lb

## 2019-12-19 DIAGNOSIS — R42 Dizziness and giddiness: Secondary | ICD-10-CM | POA: Diagnosis not present

## 2019-12-19 DIAGNOSIS — N183 Chronic kidney disease, stage 3 unspecified: Secondary | ICD-10-CM

## 2019-12-19 DIAGNOSIS — Z8249 Family history of ischemic heart disease and other diseases of the circulatory system: Secondary | ICD-10-CM | POA: Insufficient documentation

## 2019-12-19 DIAGNOSIS — Z79899 Other long term (current) drug therapy: Secondary | ICD-10-CM | POA: Insufficient documentation

## 2019-12-19 DIAGNOSIS — Z7982 Long term (current) use of aspirin: Secondary | ICD-10-CM | POA: Insufficient documentation

## 2019-12-19 DIAGNOSIS — E785 Hyperlipidemia, unspecified: Secondary | ICD-10-CM

## 2019-12-19 DIAGNOSIS — R7989 Other specified abnormal findings of blood chemistry: Secondary | ICD-10-CM

## 2019-12-19 DIAGNOSIS — F17210 Nicotine dependence, cigarettes, uncomplicated: Secondary | ICD-10-CM | POA: Insufficient documentation

## 2019-12-19 DIAGNOSIS — I129 Hypertensive chronic kidney disease with stage 1 through stage 4 chronic kidney disease, or unspecified chronic kidney disease: Secondary | ICD-10-CM | POA: Diagnosis not present

## 2019-12-19 DIAGNOSIS — M2550 Pain in unspecified joint: Secondary | ICD-10-CM

## 2019-12-19 DIAGNOSIS — I10 Essential (primary) hypertension: Secondary | ICD-10-CM | POA: Diagnosis present

## 2019-12-19 DIAGNOSIS — G8929 Other chronic pain: Secondary | ICD-10-CM | POA: Diagnosis not present

## 2019-12-19 DIAGNOSIS — Z841 Family history of disorders of kidney and ureter: Secondary | ICD-10-CM | POA: Insufficient documentation

## 2019-12-19 DIAGNOSIS — D649 Anemia, unspecified: Secondary | ICD-10-CM | POA: Diagnosis not present

## 2019-12-19 MED ORDER — AMLODIPINE BESYLATE 10 MG PO TABS: 10.0000 mg | ORAL_TABLET | Freq: Every day | ORAL | 5 refills | Status: DC

## 2019-12-19 MED ORDER — HYDROCHLOROTHIAZIDE 50 MG PO TABS: 50.0000 mg | ORAL_TABLET | Freq: Every day | ORAL | 5 refills | Status: DC

## 2019-12-19 MED ORDER — INDOMETHACIN 50 MG PO CAPS: 50.0000 mg | ORAL_CAPSULE | Freq: Two times a day (BID) | ORAL | 1 refills | Status: DC

## 2019-12-19 NOTE — Progress Notes (Signed)
Established Patient Office Visit  Subjective:  Patient ID: Darryl Parsons, male    DOB: 07-Feb-1956  Age: 64 y.o. MRN: [id number]  CC:  Chief Complaint  Patient presents with  . Medication Refill    HPI Darryl Parsons, 64 year old male, last seen in the office 08/24/2019 who is seen in follow-up of hypertension, hyperlipidemia, continued issues with chronic pain in multiple joints, chronic kidney disease, recurrent dizziness and prior abnormal thyroid blood test.  Patient also with history per chart of schizophrenia.  He reports that he is out of medication for blood pressure at today's visit and requires a refill.  He has had some dizziness.  He denies any current issues with headaches.  He stopped the use of atorvastatin as he felt that this medication " was just not good for me" but denies any side effects with the use of the medication.  He requests refill of indomethacin as he states that this is the only thing that helps with his chronic neck, back, shoulder and leg pain.  He is aware of a prior diagnosis of chronic kidney disease but he states that he wishes to have a refill of indomethacin anyway as none of the other medications, especially not Tylenol, help reduce his joint pain.  He denies any current issues with abdominal pain or nausea.  He denies any blood in the stool or black stools.  He denies any current issues with urinary frequency, urgency or dysuria.  He does not believe that he actually has problems with his kidneys as he states that he urinates well.  He denies any issues with chest pain or palpitations, no shortness of breath or cough  Past Medical History:  Diagnosis Date  . Chronic kidney disease   . Headache   . Hypertension   . Low back pain   . Mental disorder   . Schizophrenia [facility])     Past Surgical History:  Procedure Laterality Date  . IR IVC FILTER PLMT / S&I /IMG GUID/MOD SED  03/10/2017    Family History  Problem Relation Age of Onset  .  Cardiomyopathy Sister   . Lupus Sister   . Kidney failure Sister   . Factor VIII deficiency Neg Hx   . Mental illness Neg Hx     Social History   Socioeconomic History  . Marital status: Single    Spouse name: Not on file  . Number of children: Not on file  . Years of education: Not on file  . Highest education level: Not on file  Occupational History  . Not on file  Tobacco Use  . Smoking status: Current Every Day Smoker    Packs/day: 0.50    Types: Cigarettes  . Smokeless tobacco: Never Used  Substance and Sexual Activity  . Alcohol use: Yes    Comment: 1-2 beers a month  . Drug use: Yes    Types: Cocaine  . Sexual activity: Never  Other Topics Concern  . Not on file  Social History Narrative  . Not on file   Social Determinants of Health   Financial Resource Strain:   . Difficulty of Paying Living Expenses: Not on file  Food Insecurity:   . Worried About Charity fundraiser in the Last Year: Not on file  . Ran Out of Food in the Last Year: Not on file  Transportation Needs:   . Lack of Transportation (Medical): Not on file  . Lack of Transportation (Non-Medical): Not on file  Physical Activity:   . Days of Exercise per Week: Not on file  . Minutes of Exercise per Session: Not on file  Stress:   . Feeling of Stress : Not on file  Social Connections:   . Frequency of Communication with Friends and Family: Not on file  . Frequency of Social Gatherings with Friends and Family: Not on file  . Attends Religious Services: Not on file  . Active Member of Clubs or Organizations: Not on file  . Attends Archivist Meetings: Not on file  . Marital Status: Not on file  Intimate Partner Violence:   . Fear of Current or Ex-Partner: Not on file  . Emotionally Abused: Not on file  . Physically Abused: Not on file  . Sexually Abused: Not on file    Outpatient Medications Prior to Visit  Medication Sig Dispense Refill  . amLODipine (NORVASC) 10 MG tablet Take  1 tablet (10 mg total) by mouth daily. 30 tablet 6  . aspirin 325 MG tablet Take 1 tablet (325 mg total) by mouth daily. 30 tablet 0  . atorvastatin (LIPITOR) 40 MG tablet Take 1 tablet (40 mg total) by mouth daily. (Patient not taking: Reported on 08/24/2019) 30 tablet 3  . cyclobenzaprine (FLEXERIL) 5 MG tablet Take 5 mg by mouth 3 (three) times daily as needed for muscle spasms.    . diclofenac sodium (VOLTAREN) 1 % GEL Apply up to 4 times daily to painful areas 150 g 3  . diclofenac Sodium (VOLTAREN) 1 % GEL APPLY TO painful AREAS UP TO FOUR TIMES DAILY 100 g 0  . hydrochlorothiazide (HYDRODIURIL) 50 MG tablet TAKE ONE TABLET BY MOUTH DAILY 30 tablet 1  . indomethacin (INDOCIN) 50 MG capsule Take 1 capsule (50 mg total) by mouth 2 (two) times daily with a meal. If needed for pain 60 capsule 1  . methocarbamol (ROBAXIN) 750 MG tablet TAKE ONE TABLET BY MOUTH THREE TIMES DAILY as needed for muscle spasm 90 tablet 0  . thiamine 100 MG tablet Take 1 tablet (100 mg total) by mouth daily. 30 tablet 0   No facility-administered medications prior to visit.    Allergies  Allergen Reactions  . Other     Pt states he is allergic to a blood pressure pill. He is unable to state reaction. He is spelling it adelatt and aderlatt.    ROS Review of Systems  Constitutional: Negative for chills and fever.  HENT: Negative for sore throat and trouble swallowing.   Eyes: Negative for photophobia and visual disturbance.  Respiratory: Negative for cough and shortness of breath.   Cardiovascular: Negative for chest pain, palpitations and leg swelling.  Gastrointestinal: Negative for abdominal pain, blood in stool, constipation, diarrhea and nausea.  Endocrine: Negative for cold intolerance, heat intolerance, polydipsia, polyphagia and polyuria.  Genitourinary: Negative for dysuria and frequency.  Musculoskeletal: Positive for arthralgias and back pain. Negative for joint swelling.  Neurological: Positive for  dizziness. Negative for headaches.  Hematological: Negative for adenopathy. Does not bruise/bleed easily.      Objective:    Physical Exam  Constitutional: He is oriented to person, place, and time. He appears well-developed.  Neck: No JVD present. No thyromegaly present.  Cardiovascular: Normal rate and regular rhythm.  Pulmonary/Chest: Effort normal and breath sounds normal.  Abdominal: Soft. There is no abdominal tenderness. There is no rebound and no guarding.  Musculoskeletal:        General: Tenderness present. No edema.  Cervical back: Normal range of motion and neck supple.     Comments: Lumbosacral discomfort, no CVA tenderness.  Patient with complaint of discomfort in the shoulders with range of motion  Lymphadenopathy:    He has no cervical adenopathy.  Neurological: He is alert and oriented to person, place, and time.  Skin: Skin is warm and dry.  Nursing note and vitals reviewed.   BP (!) 160/112   Pulse (!) 53   Resp 16   Wt 190 lb 3.2 oz (86.3 kg)   SpO2 97%   BMI 28.92 kg/m  Wt Readings from Last 3 Encounters:  12/19/19 190 lb 3.2 oz (86.3 kg)  07/25/19 185 lb 12.8 oz (84.3 kg)  07/25/18 188 lb 3.2 oz (85.4 kg)     Health Maintenance Due  Topic Date Due  . Hepatitis C Screening  30-Jul-1956  . TETANUS/TDAP  09/21/1976  . COLONOSCOPY  12/10/2017  . INFLUENZA VACCINE  06/16/2019      Lab Results  Component Value Date   TSH 0.287 (L) 10/08/2016   Lab Results  Component Value Date   WBC 7.5 07/25/2019   HGB 12.5 (L) 07/25/2019   HCT 37.2 (L) 07/25/2019   MCV 81 07/25/2019   PLT 289 07/25/2019   Lab Results  Component Value Date   NA 140 07/25/2019   K 3.9 07/25/2019   CO2 20 07/25/2019   GLUCOSE 94 07/25/2019   BUN 24 07/25/2019   CREATININE 1.79 (H) 07/25/2019   BILITOT 0.3 07/25/2019   ALKPHOS 61 07/25/2019   AST 16 07/25/2019   ALT 11 07/25/2019   PROT 7.1 07/25/2019   ALBUMIN 4.3 07/25/2019   CALCIUM 9.1 07/25/2019    ANIONGAP 9 12/30/2017   Lab Results  Component Value Date   CHOL 175 07/25/2019   Lab Results  Component Value Date   HDL 56 07/25/2019   Lab Results  Component Value Date   LDLCALC 104 (H) 07/25/2019   Lab Results  Component Value Date   TRIG 81 07/25/2019   Lab Results  Component Value Date   CHOLHDL 3.1 07/25/2019   Lab Results  Component Value Date   HGBA1C <4.2 (L) 03/09/2017      Assessment & Plan:  1. Essential hypertension Refills provided of amlodipine 10 mg and hydrochlorothiazide.  In the past, have discussed other medications with the patient but he prefers to take the high-dose hydrochlorothiazide.  Patient will have recheck of electrolytes and renal function as part of comprehensive metabolic panel.  Patient has been asked to follow-up in the next 4 weeks with the clinical pharmacist for recheck of his blood pressure to make sure that it is controlled once he resumes use of his medications.  He is asked to pick up medications today and restart. - Comprehensive metabolic panel - hydrochlorothiazide (HYDRODIURIL) 50 MG tablet; Take 1 tablet (50 mg total) by mouth daily.  Dispense: 30 tablet; Refill: 5 - amLODipine (NORVASC) 10 MG tablet; Take 1 tablet (10 mg total) by mouth daily.  Dispense: 30 tablet; Refill: 5 - Amb Referral to Clinical Pharmacist  2. Stage 3 chronic kidney disease, unspecified whether stage 3a or 3b CKD Again tried to discuss stage III chronic kidney disease with the patient and the need to avoid nonsteroidal anti-inflammatory medications and avoid dehydration.  Patient however insists on receiving refill of indomethacin for chronic joint pain though he is aware that this medication can worsen his renal function.  He will have electrolytes and renal function  rechecked as part of comprehensive metabolic panel and will also check CBC to look for anemia related to chronic kidney disease.  Discussed with patient that if he has had any significant  worsening of chronic kidney disease that nephrology referral will also be made. - Comprehensive metabolic panel - CBC  3. Hyperlipidemia, unspecified hyperlipidemia type Patient has stopped the use of his atorvastatin.  Will recheck lipid panel and comprehensive metabolic panel and based on these results he will be contacted with recommendations.  Low-fat diet encouraged. - Comprehensive metabolic panel - Lipid panel  4. Abnormal thyroid blood test He has had prior abnormal thyroid blood test and will recheck thyroid panel with TSH at today's visit. - Thyroid Panel With TSH  5. Normocytic anemia Patient with prior normocytic anemia on blood work and will have repeat CBC at today's visit.  Anemia may be related to chronic disease/chronic kidney disease. - CBC  6. Chronic pain of multiple joints Patient with complaint of chronic pain of multiple joints and again discussed with patient that he has a diagnosis of chronic kidney disease with elevated creatinine and decreased renal function and that this can be worsened with the use of nonsteroidal anti-inflammatories and medication such as indomethacin.  Patient however feels that the indomethacin is the only thing that helps with his pain and he is provided with a limited supply. - indomethacin (INDOCIN) 50 MG capsule; Take 1 capsule (50 mg total) by mouth 2 (two) times daily with a meal. If needed for pain  Dispense: 60 capsule; Refill: 1  7. Dizziness He will have recheck of electrolytes and anemia in follow-up of his complaints of dizziness.  He is also encouraged to restart his blood pressure medication and will have lipid panel at today's visit but may need further work-up such as carotid ultrasounds if dizziness continues.   An After Visit Summary was printed and given to the patient.  Follow-up: Return in about 5 months (around 05/17/2020) for chronic issues/HTN; 4 weeks with Lurena Joiner for BP recheck.   30 or more minutes of face-to-face  time was spent with the patient at today's visit obtaining current history, performing exam, discussing medications and medical conditions with additional 10 to 12 minutes for completion of office note/chart review.   Antony Blackbird, MD

## 2019-12-20 LAB — COMPREHENSIVE METABOLIC PANEL WITH GFR
ALT: 9 IU/L (ref 0–44)
AST: 13 IU/L (ref 0–40)
Albumin/Globulin Ratio: 1.8 (ref 1.2–2.2)
Albumin: 4.5 g/dL (ref 3.8–4.8)
Alkaline Phosphatase: 71 IU/L (ref 39–117)
BUN/Creatinine Ratio: 8 — ABNORMAL LOW (ref 10–24)
BUN: 15 mg/dL (ref 8–27)
Bilirubin Total: 0.4 mg/dL (ref 0.0–1.2)
CO2: 24 mmol/L (ref 20–29)
Calcium: 9.2 mg/dL (ref 8.6–10.2)
Chloride: 105 mmol/L (ref 96–106)
Creatinine, Ser: 1.83 mg/dL — ABNORMAL HIGH (ref 0.76–1.27)
GFR calc Af Amer: 45 mL/min/1.73 — ABNORMAL LOW
GFR calc non Af Amer: 39 mL/min/1.73 — ABNORMAL LOW
Globulin, Total: 2.5 g/dL (ref 1.5–4.5)
Glucose: 87 mg/dL (ref 65–99)
Potassium: 4.2 mmol/L (ref 3.5–5.2)
Sodium: 139 mmol/L (ref 134–144)
Total Protein: 7 g/dL (ref 6.0–8.5)

## 2019-12-20 LAB — THYROID PANEL WITH TSH
Free Thyroxine Index: 1.4 (ref 1.2–4.9)
T3 Uptake Ratio: 22 % — ABNORMAL LOW (ref 24–39)
T4, Total: 6.5 ug/dL (ref 4.5–12.0)
TSH: 0.76 u[IU]/mL (ref 0.450–4.500)

## 2019-12-20 LAB — CBC
Hematocrit: 40.5 % (ref 37.5–51.0)
Hemoglobin: 13.3 g/dL (ref 13.0–17.7)
MCH: 27.7 pg (ref 26.6–33.0)
MCHC: 32.8 g/dL (ref 31.5–35.7)
MCV: 84 fL (ref 79–97)
Platelets: 240 x10E3/uL (ref 150–450)
RBC: 4.81 x10E6/uL (ref 4.14–5.80)
RDW: 14.5 % (ref 11.6–15.4)
WBC: 7.9 x10E3/uL (ref 3.4–10.8)

## 2019-12-25 ENCOUNTER — Other Ambulatory Visit: Payer: Self-pay | Admitting: Family Medicine

## 2019-12-25 DIAGNOSIS — N183 Chronic kidney disease, stage 3 unspecified: Secondary | ICD-10-CM

## 2019-12-31 ENCOUNTER — Encounter: Payer: Self-pay | Admitting: *Deleted

## 2019-12-31 NOTE — Progress Notes (Unsigned)
Unable to reach patient regarding lab results and result note. Will mail letter to patient.   Antony Blackbird, MD  Carilyn Goodpasture, RN  Patient with creatinine of 1.83 which is increased from 1.79 otherwise normal electrolytes and normal liver enzymes. Normal white blood cell count. Normal TSH and normal T4 however would recommend rechecking T4 and TSH in 4 to 6 months to make sure the patient continues to make enough thyroid hormone. Referral will be placed for patient to follow-up with nephrology regarding his chronic kidney disease and he is again advised to avoid medications that can harm his kidneys such as ibuprofen, indomethacin, naproxen and to remain well-hydrated and make sure that his blood pressure remains controlled.

## 2020-02-04 ENCOUNTER — Other Ambulatory Visit: Payer: Self-pay | Admitting: Physician Assistant

## 2020-02-04 DIAGNOSIS — I10 Essential (primary) hypertension: Secondary | ICD-10-CM

## 2020-02-06 ENCOUNTER — Other Ambulatory Visit: Payer: Medicaid Other

## 2020-04-25 ENCOUNTER — Telehealth: Payer: Self-pay

## 2020-04-25 DIAGNOSIS — M2550 Pain in unspecified joint: Secondary | ICD-10-CM

## 2020-04-25 NOTE — Telephone Encounter (Signed)
1) Medication(s) Requested (by name):  INDOCIN 50 MG  2) Pharmacy of Choice: [facility]  3) Special Requests:   Approved medications will be sent to the pharmacy, we will reach out if there is an issue.  Requests made after 3pm may not be addressed until the following business day!  If a patient is unsure of the name of the medication(s) please note and ask patient to call back when they are able to provide all info, do not send to responsible party until all information is available!

## 2020-04-25 NOTE — Telephone Encounter (Signed)
Patient has an appointment coming up at the end of this month. Will defer until then.

## 2020-05-01 ENCOUNTER — Telehealth: Payer: Self-pay | Admitting: Family Medicine

## 2020-05-01 DIAGNOSIS — M2550 Pain in unspecified joint: Secondary | ICD-10-CM

## 2020-05-01 DIAGNOSIS — G8929 Other chronic pain: Secondary | ICD-10-CM

## 2020-05-01 DIAGNOSIS — I10 Essential (primary) hypertension: Secondary | ICD-10-CM

## 2020-05-01 DIAGNOSIS — M545 Low back pain, unspecified: Secondary | ICD-10-CM

## 2020-05-01 NOTE — Telephone Encounter (Signed)
1) Medication(s) Requested (by name):indomethacin (INDOCIN) 50 MG capsule   2) Pharmacy of Choice: [facility], [address]  [place], [place] [address]   3) Special Requests: Patient states the pharmacy is closed on the weekends   Approved medications will be sent to the pharmacy, we will reach out if there is an issue.  Requests made after 3pm may not be addressed until the following business day!  If a patient is unsure of the name of the medication(s) please note and ask patient to call back when they are able to provide all info, do not send to responsible party until all information is available!

## 2020-05-01 NOTE — Telephone Encounter (Signed)
Patient is being seen next week. Will defer to provider seeing him then.

## 2020-05-13 ENCOUNTER — Ambulatory Visit: Payer: Medicaid Other | Admitting: Internal Medicine

## 2020-05-13 MED ORDER — METHOCARBAMOL 750 MG PO TABS: ORAL_TABLET | ORAL | 0 refills | Status: DC

## 2020-05-13 MED ORDER — INDOMETHACIN 50 MG PO CAPS: 50.0000 mg | ORAL_CAPSULE | Freq: Two times a day (BID) | ORAL | 0 refills | Status: DC

## 2020-05-13 MED ORDER — AMLODIPINE BESYLATE 10 MG PO TABS: 10.0000 mg | ORAL_TABLET | Freq: Every day | ORAL | 0 refills | Status: DC

## 2020-05-13 NOTE — Telephone Encounter (Signed)
[place] please send to [facility].  P appt today with Franco Nones was canceled suddenly. Pt given new appt 05/29/20 with NP.

## 2020-05-13 NOTE — Addendum Note (Signed)
Addended by: Daisy Blossom, Annie Main L on: 05/13/2020 06:05 PM   Modules accepted: Orders

## 2020-05-13 NOTE — Telephone Encounter (Signed)
Rx sent to last until upcoming appt.

## 2020-05-29 ENCOUNTER — Ambulatory Visit (HOSPITAL_BASED_OUTPATIENT_CLINIC_OR_DEPARTMENT_OTHER): Payer: Medicaid Other | Admitting: Family

## 2020-05-29 DIAGNOSIS — Z5329 Procedure and treatment not carried out because of patient's decision for other reasons: Secondary | ICD-10-CM

## 2020-05-29 NOTE — Progress Notes (Signed)
Patient did not show for appointment.   

## 2020-06-25 ENCOUNTER — Other Ambulatory Visit: Payer: Self-pay | Admitting: Family Medicine

## 2020-06-25 DIAGNOSIS — M545 Low back pain, unspecified: Secondary | ICD-10-CM

## 2020-06-25 DIAGNOSIS — M2550 Pain in unspecified joint: Secondary | ICD-10-CM

## 2020-06-25 NOTE — Telephone Encounter (Signed)
Medication Refill - Medication: methocarbamol (ROBAXIN) 750 MG tablet,indomethacin (INDOCIN) 50 MG capsule,diclofenac Sodium (VOLTAREN) 1 % GEL  Has the patient contacted their pharmacy? yes (Agent: If no, request that the patient contact the pharmacy for the refill.) (Agent: If yes, when and what did the pharmacy advise?)contact pcp  Preferred Pharmacy (with phone number or street name):  [facility], [place] Phone:  [phone]  Fax:  [phone]       Agent: Please be advised that RX refills may take up to 3 business days. We ask that you follow-up with your pharmacy.

## 2020-06-25 NOTE — Telephone Encounter (Signed)
Requested medication (s) are due for refill today:  Yes  Requested medication (s) are on the active medication list:  Yes  Future visit scheduled:  No  Last Refill:  Indocin: 05/13/20; #28; no refills                    Robaxin:  05/13/20; #42; no refills                    Voltaen Gel: 11/20/19; 100 g. ; no refills  Notes to clinic.  Pt. no showed appt. In July; call placed to pt.  Left vm to call office to schedule appt for f/u.; has already had courtesy refill on 2 out of 3 medications above.   Requested Prescriptions  Pending Prescriptions Disp Refills   indomethacin (INDOCIN) 50 MG capsule 28 capsule 0    Sig: Take 1 capsule (50 mg total) by mouth 2 (two) times daily with a meal. If needed for pain      Analgesics:  NSAIDS Failed - 06/25/2020  2:56 PM      Failed - Cr in normal range and within 360 days    Creatinine, Ser  Date Value Ref Range Status  12/19/2019 1.83 (H) 0.76 - 1.27 mg/dL Final   Creatinine,U  Date Value Ref Range Status  12/09/2007   Final   190.2 (NOTE)  Cutoff Values for Urine Drug Screen:        Drug Class           Cutoff (ng/mL)        Amphetamines            1000        Barbiturates             200        Cocaine Metabolites      300        Benzodiazepines          200        Methadone                 300        Opiates                 2000        Phencyclidine             25        Propoxyphene             300        Marijuana Metabolites     50  For medical purposes only.   Creatinine, Urine  Date Value Ref Range Status  12/09/2007 183.0  Final          Passed - HGB in normal range and within 360 days    Hemoglobin  Date Value Ref Range Status  12/19/2019 13.3 13.0 - 17.7 g/dL Final          Passed - Patient is not pregnant      Passed - Valid encounter within last 12 months    Recent Outpatient Visits           3 weeks ago No-show for appointment   [place], [place], NP   6 months ago Essential  hypertension   [place], MD   10 months ago Chronic pain of multiple joints   [place]  Antony Blackbird, MD   11 months ago Essential hypertension   [place], Dionne Bucy, [place]   1 year ago Essential hypertension   [place], [place]                methocarbamol (ROBAXIN) 750 MG tablet 42 tablet 0    Sig: TAKE ONE TABLET BY MOUTH THREE TIMES DAILY as needed for muscle spasm      Not Delegated - Analgesics:  Muscle Relaxants Failed - 06/25/2020  2:56 PM      Failed - This refill cannot be delegated      Failed - Valid encounter within last 6 months    Recent Outpatient Visits           3 weeks ago No-show for appointment   [place], [place], NP   6 months ago Essential hypertension   [place], MD   10 months ago Chronic pain of multiple joints   [place], MD   11 months ago Essential hypertension   [place], [place], [place]   1 year ago Essential hypertension   [place], MD                diclofenac Sodium (VOLTAREN) 1 % GEL 100 g 0      Analgesics:  Topicals Passed - 06/25/2020  2:56 PM      Passed - Valid encounter within last 12 months    Recent Outpatient Visits           3 weeks ago No-show for appointment   [place], [place], NP   6 months ago Essential hypertension   [place], MD   10 months ago Chronic pain of multiple joints   [place], MD   11 months ago Essential hypertension   [place], [place], [place]   1 year ago  Essential hypertension   [place], [place], [place]

## 2020-07-18 ENCOUNTER — Encounter: Payer: Self-pay | Admitting: Family

## 2020-07-18 ENCOUNTER — Ambulatory Visit: Payer: Medicaid Other | Attending: Family | Admitting: Family

## 2020-07-18 ENCOUNTER — Other Ambulatory Visit: Payer: Self-pay

## 2020-07-18 VITALS — BP 168/107 | HR 59 | Temp 97.3°F | Resp 20 | Ht 68.0 in | Wt 185.2 lb

## 2020-07-18 DIAGNOSIS — M545 Low back pain, unspecified: Secondary | ICD-10-CM

## 2020-07-18 DIAGNOSIS — N183 Chronic kidney disease, stage 3 unspecified: Secondary | ICD-10-CM

## 2020-07-18 DIAGNOSIS — I10 Essential (primary) hypertension: Secondary | ICD-10-CM | POA: Diagnosis not present

## 2020-07-18 DIAGNOSIS — E785 Hyperlipidemia, unspecified: Secondary | ICD-10-CM

## 2020-07-18 DIAGNOSIS — Z599 Problem related to housing and economic circumstances, unspecified: Secondary | ICD-10-CM

## 2020-07-18 MED ORDER — CLONIDINE HCL 0.1 MG PO TABS
0.1000 mg | ORAL_TABLET | Freq: Once | ORAL | Status: AC
  Administered 2020-07-18: 0.1 mg via ORAL

## 2020-07-18 MED ORDER — DICLOFENAC SODIUM 1 % EX GEL: CUTANEOUS | 1 refills | Status: DC

## 2020-07-18 MED ORDER — HYDROCHLOROTHIAZIDE 50 MG PO TABS: 50.0000 mg | ORAL_TABLET | Freq: Every day | ORAL | 2 refills | Status: DC

## 2020-07-18 MED ORDER — AMLODIPINE BESYLATE 10 MG PO TABS: 10.0000 mg | ORAL_TABLET | Freq: Every day | ORAL | 2 refills | Status: DC

## 2020-07-18 MED ORDER — METHOCARBAMOL 750 MG PO TABS: ORAL_TABLET | ORAL | 0 refills | Status: DC

## 2020-07-18 MED ORDER — ATORVASTATIN CALCIUM 40 MG PO TABS: 40.0000 mg | ORAL_TABLET | Freq: Every day | ORAL | 2 refills | Status: DC

## 2020-07-18 MED FILL — HYDROCHLOROTHIAZIDE 50 MG T: 50 | 30 days supply | Qty: 30 | Fill #0

## 2020-07-18 MED FILL — METHOCARBAMOL 750 MG TABS: 750 | 14 days supply | Qty: 42 | Fill #0

## 2020-07-18 MED FILL — ATORVASTATIN CALCIUM 40 MG: 40 | 30 days supply | Qty: 30 | Fill #0

## 2020-07-18 MED FILL — DICLOFENAC SODIUM 1% GEL: 1 | 25 days supply | Qty: 100 | Fill #0

## 2020-07-18 MED FILL — AMLODIPINE BESYLATE 10 MG T: 10 | 30 days supply | Qty: 30 | Fill #0

## 2020-07-18 NOTE — Patient Instructions (Addendum)
Hydrochlorothiazide and Amlodipine for high blood pressure. Atorvastatin for high cholesterol. Lab today. Return within 1 week for fasting lipid panel. Referral to Social Work. Follow-up in 1 month with clinical pharmacist for blood pressure check. Follow-up with primary physician in 3 months or sooner if needed for management of chronic conditions.  Hypertension, Adult Hypertension is another name for high blood pressure. High blood pressure forces your heart to work harder to pump blood. This can cause problems over time. There are two numbers in a blood pressure reading. There is a top number (systolic) over a bottom number (diastolic). It is best to have a blood pressure that is below 120/80. Healthy choices can help lower your blood pressure, or you may need medicine to help lower it. What are the causes? The cause of this condition is not known. Some conditions may be related to high blood pressure. What increases the risk?  Smoking.  Having type 2 diabetes mellitus, high cholesterol, or both.  Not getting enough exercise or physical activity.  Being overweight.  Having too much fat, sugar, calories, or salt (sodium) in your diet.  Drinking too much alcohol.  Having long-term (chronic) kidney disease.  Having a family history of high blood pressure.  Age. Risk increases with age.  Race. You may be at higher risk if you are African American.  Gender. Men are at higher risk than women before age 47. After age 81, women are at higher risk than men.  Having obstructive sleep apnea.  Stress. What are the signs or symptoms?  High blood pressure may not cause symptoms. Very high blood pressure (hypertensive crisis) may cause: ? Headache. ? Feelings of worry or nervousness (anxiety). ? Shortness of breath. ? Nosebleed. ? A feeling of being sick to your stomach (nausea). ? Throwing up (vomiting). ? Changes in how you see. ? Very bad chest pain. ? Seizures. How is this  treated?  This condition is treated by making healthy lifestyle changes, such as: ? Eating healthy foods. ? Exercising more. ? Drinking less alcohol.  Your health care provider may prescribe medicine if lifestyle changes are not enough to get your blood pressure under control, and if: ? Your top number is above 130. ? Your bottom number is above 80.  Your personal target blood pressure may vary. Follow these instructions at home: Eating and drinking   If told, follow the DASH eating plan. To follow this plan: ? Fill one half of your plate at each meal with fruits and vegetables. ? Fill one fourth of your plate at each meal with whole grains. Whole grains include whole-wheat pasta, brown rice, and whole-grain bread. ? Eat or drink low-fat dairy products, such as skim milk or low-fat yogurt. ? Fill one fourth of your plate at each meal with low-fat (lean) proteins. Low-fat proteins include fish, chicken without skin, eggs, beans, and tofu. ? Avoid fatty meat, cured and processed meat, or chicken with skin. ? Avoid pre-made or processed food.  Eat less than 1,500 mg of salt each day.  Do not drink alcohol if: ? Your doctor tells you not to drink. ? You are pregnant, may be pregnant, or are planning to become pregnant.  If you drink alcohol: ? Limit how much you use to:  0-1 drink a day for women.  0-2 drinks a day for men. ? Be aware of how much alcohol is in your drink. In the [place] one drink equals one 12 oz bottle of beer (355 mL), one  5 oz glass of wine (148 mL), or one 1 oz glass of hard liquor (44 mL). Lifestyle   Work with your doctor to stay at a healthy weight or to lose weight. Ask your doctor what the best weight is for you.  Get at least 30 minutes of exercise most days of the week. This may include walking, swimming, or biking.  Get at least 30 minutes of exercise that strengthens your muscles (resistance exercise) at least 3 days a week. This may include  lifting weights or doing Pilates.  Do not use any products that contain nicotine or tobacco, such as cigarettes, e-cigarettes, and chewing tobacco. If you need help quitting, ask your doctor.  Check your blood pressure at home as told by your doctor.  Keep all follow-up visits as told by your doctor. This is important. Medicines  Take over-the-counter and prescription medicines only as told by your doctor. Follow directions carefully.  Do not skip doses of blood pressure medicine. The medicine does not work as well if you skip doses. Skipping doses also puts you at risk for problems.  Ask your doctor about side effects or reactions to medicines that you should watch for. Contact a doctor if you:  Think you are having a reaction to the medicine you are taking.  Have headaches that keep coming back (recurring).  Feel dizzy.  Have swelling in your ankles.  Have trouble with your vision. Get help right away if you:  Get a very bad headache.  Start to feel mixed up (confused).  Feel weak or numb.  Feel faint.  Have very bad pain in your: ? Chest. ? Belly (abdomen).  Throw up more than once.  Have trouble breathing. Summary  Hypertension is another name for high blood pressure.  High blood pressure forces your heart to work harder to pump blood.  For most people, a normal blood pressure is less than 120/80.  Making healthy choices can help lower blood pressure. If your blood pressure does not get lower with healthy choices, you may need to take medicine. This information is not intended to replace advice given to you by your health care provider. Make sure you discuss any questions you have with your health care provider. Document Revised: 07/12/2018 Document Reviewed: 07/12/2018 Elsevier Patient Education  2020 [employer].

## 2020-07-18 NOTE — Progress Notes (Signed)
Patient ID: Darryl Parsons, male    DOB: [birth year]  MRN: [id number]  CC: CHRONIC ISSUES FOLLOW-UP  Subjective: Darryl Parsons is a 64 y.o. 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  1. HYPERTENSION FOLLOW-UP: 12/19/2019: Visit with Dr. Chapman Fitch. During that encounter continued on Amlodipine and Hydrochlorothiazide (patient's preference to take high dose HCTZ). CMP obtained. Follow-up in 4 weeks with clinical pharmacist for blood pressure check.  07/18/2020: Currently taking: see medication list Have you taken your blood pressure medication today: '[]'  Yes '[x]'  No  Med Adherence: '[]'  Yes    '[x]'  No, reports it has been at least1 month since he took any medication because he lost the medication Adherence with salt restriction: '[]'  Yes    '[x]'  No Exercise: Yes '[x]'  No '[]'  Home Monitoring?: '[]'  Yes    '[x]'  No Monitoring Frequency: '[]'  Yes    '[x]'  No Home BP results range: '[]'  Yes    '[x]'  No Smoking '[x]'  Yes, 0.5 pack daily and reports he rolls his own cigarettes  SOB? '[]'  Yes    '[x]'  No Chest Pain?: '[x]'  Yes, sometimes  Leg swelling?: '[]'  Yes    '[x]'  No Headaches?: '[x]'  Yes    '[]'  No  Dizziness? '[]'  Yes    '[x]'  No  2. CHRONIC KIDNEY DISEASE FOLLOW-UP: 12/19/2019: Visit with Dr. Chapman Fitch. During that encounter discussed need to avoid nonsteroidal anti-inflammatory medications and avoid dehydration. Patient insistent on receiving refill of Indomethacin for chronic joint pain although he is aware medication could worsen his renal function. CMP and CBC obtained. Discussed if he had any significant worsening of chronic kidney disease that a Nephrology referral would be made.   3. HYPERLIPIDEMIA FOLLOW-UP: 12/19/2019: Visit with Dr. Chapman Fitch. During that encounter patient had  stopped use of Atorvastatin. Lipid panel and CMP obtained.   07/18/2020: Last Lipid Panel results:  HDL  Date Value Ref Range Status  07/25/2019 56 >39 mg/dL Final   Triglycerides  Date Value Ref Range Status  07/25/2019 81 0 - 149 mg/dL Final    Are you fasting today: '[]'  Yes '[x]'  No, had oatmeal and beer  Med Adherence: '[]'  Yes    '[x]'  No, never took it states "I don't have high cholesterol." Diet Adherence: '[]'  Yes    '[x]'  No  4. CHRONIC PAIN FOLLOW-UP: 12/19/2019: Visit with Dr. Chapman Fitch. During that encounter patient with chronic pain of multiple joints. Discussed with patient diagnosis of chronic kidney disease with elevated creatinine and decreased renal function and that this could be worsened with the use of nonsteroidal anti-inflammatories and medications such as Indomethacin. Patient felt Indomethacin is the only medication that will help with his pain and provided with a limited supply.  07/18/2020: Today patient reports pain is about the same and would like a refill of Indomethacin.  5. HOUSING PROBLEM: Reports he is living at the [facility] related to financial reasons. Today showing pictures of what appears to be bedbugs on the hotel mattress and hotel linen. Reports the ceiling is falling in and recently he found a snake in the bathroom. Reports he has complained to hotel management with no success.  Patient Active Problem List   Diagnosis Date Noted  . Vertigo   . DVT (deep vein thrombosis) in pregnancy   . Central venous catheter in place   .  Occlusion of posterior inferior cerebellar artery with infarction, left ([place])   . Posterior circulation stroke ([place]) 03/09/2017  . Stroke (cerebrum) ([place]) 03/09/2017  . Hypovitaminosis   . Tobacco use   . Overdose 10/06/2016  . Schizoaffective disorder, depressive type ([place]) 08/27/2016  . Cocaine use disorder, mild, abuse ([place]) 08/27/2016  . Essential hypertension 08/27/2016  . Chronic headache 08/27/2016  . Suicidal ideation  02/03/2016  . Encephalopathy acute 01/30/2016  . CKD (chronic kidney disease) stage 4, GFR 15-29 ml/min ([facility]) 01/30/2016  . Benign essential HTN 01/30/2016  . Cocaine abuse ([place]) 08/21/2012  . Hypokalemia 08/21/2012     Current Outpatient Medications on File Prior to Visit  Medication Sig Dispense Refill  . amLODipine (NORVASC) 10 MG tablet Take 1 tablet (10 mg total) by mouth daily. 14 tablet 0  . aspirin 325 MG tablet Take 1 tablet (325 mg total) by mouth daily. 30 tablet 0  . atorvastatin (LIPITOR) 40 MG tablet Take 1 tablet (40 mg total) by mouth daily. (Patient not taking: Reported on 08/24/2019) 30 tablet 3  . cyclobenzaprine (FLEXERIL) 5 MG tablet Take 5 mg by mouth 3 (three) times daily as needed for muscle spasms.    . diclofenac sodium (VOLTAREN) 1 % GEL Apply up to 4 times daily to painful areas 150 g 3  . diclofenac Sodium (VOLTAREN) 1 % GEL APPLY TO painful AREAS UP TO FOUR TIMES DAILY 100 g 0  . hydrochlorothiazide (HYDRODIURIL) 50 MG tablet Take 1 tablet (50 mg total) by mouth daily. 30 tablet 5  . indomethacin (INDOCIN) 50 MG capsule Take 1 capsule (50 mg total) by mouth 2 (two) times daily with a meal. If needed for pain 28 capsule 0  . methocarbamol (ROBAXIN) 750 MG tablet TAKE ONE TABLET BY MOUTH THREE TIMES DAILY as needed for muscle spasm 42 tablet 0  . thiamine 100 MG tablet Take 1 tablet (100 mg total) by mouth daily. 30 tablet 0   No current facility-administered medications on file prior to visit.    Allergies  Allergen Reactions  . Other     Pt states he is allergic to a blood pressure pill. He is unable to state reaction. He is spelling it adelatt and aderlatt.    Social History   Socioeconomic History  . Marital status: Single    Spouse name: Not on file  . Number of children: Not on file  . Years of education: Not on file  . Highest education level: Not on file  Occupational History  . Not on file  Tobacco Use  . Smoking status: Current Every Day  Smoker    Packs/day: 0.50    Types: Cigarettes  . Smokeless tobacco: Never Used  Vaping Use  . Vaping Use: Never used  Substance and Sexual Activity  . Alcohol use: Yes    Comment: 1-2 beers a month  . Drug use: Yes    Types: Cocaine  . Sexual activity: Never  Other Topics Concern  . Not on file  Social History Narrative  . Not on file   Social Determinants of Health   Financial Resource Strain:   . Difficulty of Paying Living Expenses: Not on file  Food Insecurity:   . Worried About Charity fundraiser in the Last Year: Not on file  . Ran Out of Food in the Last Year: Not on file  Transportation Needs:   . Lack of Transportation (Medical): Not on file  . Lack of Transportation (Non-Medical): Not on  file  Physical Activity:   . Days of Exercise per Week: Not on file  . Minutes of Exercise per Session: Not on file  Stress:   . Feeling of Stress : Not on file  Social Connections:   . Frequency of Communication with Friends and Family: Not on file  . Frequency of Social Gatherings with Friends and Family: Not on file  . Attends Religious Services: Not on file  . Active Member of Clubs or Organizations: Not on file  . Attends Archivist Meetings: Not on file  . Marital Status: Not on file  Intimate Partner Violence:   . Fear of Current or Ex-Partner: Not on file  . Emotionally Abused: Not on file  . Physically Abused: Not on file  . Sexually Abused: Not on file    Family History  Problem Relation Age of Onset  . Cardiomyopathy Sister   . Lupus Sister   . Kidney failure Sister   . Factor VIII deficiency Neg Hx   . Mental illness Neg Hx     Past Surgical History:  Procedure Laterality Date  . IR IVC FILTER PLMT / S&I /IMG GUID/MOD SED  03/10/2017    ROS: Review of Systems Negative except as stated above  PHYSICAL EXAM: Vitals with BMI 07/18/2020 07/18/2020 07/18/2020  Height - - '5\' 8"'   Weight - - 185 lbs 3 oz  BMI - - 30.09  Systolic 233 007 [id number]    Diastolic [phone]  Pulse - - 59  Some encounter information is confidential and restricted. Go to Review Flowsheets activity to see all data.    Wt Readings from Last 3 Encounters:  07/18/20 185 lb 3.2 oz (84 kg)  12/19/19 190 lb 3.2 oz (86.3 kg)  07/25/19 185 lb 12.8 oz (84.3 kg)    Physical Exam General appearance - alert, well appearing, and in no distress and oriented to person, place, and time Mental status - alert, oriented to person, place, and time, normal mood, behavior, speech, dress, motor activity, and thought processes Neck - supple, no significant adenopathy Lymphatics - no palpable lymphadenopathy, no hepatosplenomegaly Chest - clear to auscultation, no wheezes, rales or rhonchi, symmetric air entry, no tachypnea, retractions or cyanosis Heart - normal rate, regular rhythm, normal S1, S2, no murmurs, rubs, clicks or gallops Neurological - alert, oriented, normal speech, no focal findings or movement disorder noted, cranial nerves II through XII intact, DTR's normal and symmetric, motor and sensory grossly normal bilaterally, normal muscle tone, no tremors, strength 5/5, Romberg sign negative, normal gait and station  ASSESSMENT AND PLAN: 1. Essential hypertension: - Uncontrolled. - Blood pressure elevated during today's visit. Patient asymptomatic without chest pressure, chest pain, palpitations, and shortness of breath.  - Patient reports he has not taken blood pressure medication for at least 1 month because he lost the medication. - Initial blood pressure from RN 177/105. Provider rechecked blood pressure which resulted 165/112. - Clonidine 0.1 mg for one dose administered in clinic. Blood pressure rechecked and resulted 168/107. - Patient has been without antihypertensives for at least 1 month which is likely the reason blood pressure is uncontrolled and did not significantly decrease with Clonidine administration.  - CMP to check kidney function, liver function,  and electrolyte balance.  - CBC to check blood count for anemia.  - Begin taking Hydrochlorothiazide and Amlodipine as prescribed.  - Follow-up with in 4 weeks with clinical pharmacist for blood pressure check. Write down your blood pressure readings each  day and bring those results along with your home blood pressure monitor to your appointment. Medications may be adjusted at that time if needed. - Follow-up with primary physician in 3 months or sooner if needed. - Patient was given clear instructions to go to Emergency Department or return to medical center if symptoms don't improve, worsen, or new problems develop.The patient verbalized understanding. - CMP14+EGFR - CBC With Differential- cloNIDine (CATAPRES) tablet 0.1 mg - amLODipine (NORVASC) 10 MG tablet; Take 1 tablet (10 mg total) by mouth daily.  Dispense: 30 tablet; Refill: 2 - hydrochlorothiazide (HYDRODIURIL) 50 MG tablet; Take 1 tablet (50 mg total) by mouth daily.  Dispense: 30 tablet; Refill: 2 - Amb Referral to Clinical Pharmacist  2. Stage 3 chronic kidney disease, unspecified whether stage 3a or 3b CKD: - CMP to check kidney function, liver function, and electrolyte balance.  - CBC to check blood count for anemia.  - Counseled patient to avoid nonsteroidal anti-inflammatory medications and avoid dehydration. - If any significant worsening of chronic kidney disease Nephrology referral will be made.  - Follow-up with primary physician in 3 months or sooner if needed.  - CMP14+EGFR - CBC With Differential  3. Hyperlipidemia, unspecified hyperlipidemia type: - Practice low-fat heart healthy diet and at least 150 minutes of moderate intensity exercise weekly as tolerated.  - Continue Atorvastatin as prescribed. Patient reports he does not believe he had high cholesterol in the past and reports never taking the medication because he does not feel as if he needs it. - Return within 1 week for lipid panel. Be sure to be fasting,  meaning nothing to eat or drink, at least 8 to 12 hours prior to appointment. - Follow-up with primary physician in 3 months or sooner if needed.  - Lipid panel; Future - atorvastatin (LIPITOR) 40 MG tablet; Take 1 tablet (40 mg total) by mouth daily.  Dispense: 30 tablet; Refill: 2  4. Low back pain with radiation: - Patient with chronic low back pain with radiation.  - Continue Diclofenac gel and Methocarbamol as prescribed.  - Counseled patient Indomethacin will not be prescribed considering the patient has chronic kidney disease which can be worsened by nonsteroidal anti-inflammatories.  - Follow-up with primary physician in 3 months or sooner if needed.  - diclofenac Sodium (VOLTAREN) 1 % GEL; APPLY TO painful AREAS UP TO FOUR TIMES DAILY  Dispense: 100 g; Refill: 1 - methocarbamol (ROBAXIN) 750 MG tablet; TAKE ONE TABLET BY MOUTH THREE TIMES DAILY as needed for muscle spasm  Dispense: 42 tablet; Refill: 0  5. Housing problems: - Reports he is living at the [employer] related to financial reasons. Today showing pictures on cellphone of what appears to be bedbugs on the hotel mattress and hotel linen. Reports the ceiling is falling in and recently he found a snake in the bathroom. Reports he has complained to hotel management with no success. - Requesting to sign a release of medical records to his lawyer for litigation. - Referral to Social Work for counseling and community resources.  - Ambulatory referral to Social Work  Patient was given the opportunity to ask questions.  Patient verbalized understanding of the plan and was able to repeat key elements of the plan. Patient was given clear instructions to go to Emergency Department or return to medical center if symptoms don't improve, worsen, or new problems develop.The patient verbalized understanding.   Camillia Herter, NP

## 2020-07-18 NOTE — Progress Notes (Signed)
Needs refills on diclofenac, amlodipine, cyclobenzaprine, Indocin, and Robaxin

## 2020-07-19 LAB — CMP14+EGFR
ALT: 8 IU/L (ref 0–44)
AST: 14 IU/L (ref 0–40)
Albumin/Globulin Ratio: 1.4 (ref 1.2–2.2)
Albumin: 3.9 g/dL (ref 3.8–4.8)
Alkaline Phosphatase: 62 IU/L (ref 48–121)
BUN/Creatinine Ratio: 11 (ref 10–24)
BUN: 19 mg/dL (ref 8–27)
Bilirubin Total: 0.5 mg/dL (ref 0.0–1.2)
CO2: 23 mmol/L (ref 20–29)
Calcium: 8.8 mg/dL (ref 8.6–10.2)
Chloride: 104 mmol/L (ref 96–106)
Creatinine, Ser: 1.69 mg/dL — ABNORMAL HIGH (ref 0.76–1.27)
GFR calc Af Amer: 49 mL/min/{1.73_m2} — ABNORMAL LOW (ref >59)
GFR calc non Af Amer: 43 mL/min/{1.73_m2} — ABNORMAL LOW (ref >59)
Globulin, Total: 2.7 g/dL (ref 1.5–4.5)
Glucose: 87 mg/dL (ref 65–99)
Potassium: 3.3 mmol/L — ABNORMAL LOW (ref 3.5–5.2)
Sodium: 139 mmol/L (ref 134–144)
Total Protein: 6.6 g/dL (ref 6.0–8.5)

## 2020-07-19 LAB — CBC WITH DIFFERENTIAL
Basophils Absolute: 0 10*3/uL (ref 0.0–0.2)
Basos: 0 %
EOS (ABSOLUTE): 0.1 10*3/uL (ref 0.0–0.4)
Eos: 1 %
Hematocrit: 37.4 % — ABNORMAL LOW (ref 37.5–51.0)
Hemoglobin: 12.1 g/dL — ABNORMAL LOW (ref 13.0–17.7)
Immature Grans (Abs): 0 10*3/uL (ref 0.0–0.1)
Immature Granulocytes: 0 %
Lymphocytes Absolute: 2.5 10*3/uL (ref 0.7–3.1)
Lymphs: 34 %
MCH: 27.1 pg (ref 26.6–33.0)
MCHC: 32.4 g/dL (ref 31.5–35.7)
MCV: 84 fL (ref 79–97)
Monocytes Absolute: 0.5 10*3/uL (ref 0.1–0.9)
Monocytes: 7 %
Neutrophils Absolute: 4.3 10*3/uL (ref 1.4–7.0)
Neutrophils: 58 %
RBC: 4.46 x10E6/uL (ref 4.14–5.80)
RDW: 14.5 % (ref 11.6–15.4)
WBC: 7.4 10*3/uL (ref 3.4–10.8)

## 2020-07-22 MED FILL — HYDROCHLOROTHIAZIDE 50 MG T: 50 | 30 days supply | Qty: 30 | Fill #0

## 2020-07-23 NOTE — Progress Notes (Signed)
Please call patient with update.   Kidney function not 100% but improved some since 7 months ago. We would like to continue to monitor your levels every 3-6 months. Over the counter prescriptions medications such as Ibuprofen (Advil) or Naproxyn (Aleve) can be damaging to your kidneys and try to avoid those as much as possible. Tylenol or Acetaminophen is a safer option to use.  Liver function normal.   Potassium slightly low. Try adding potassium rich foods to your diet such as leafy green vegetables, nuts, and beans.   Hemoglobin and hematocrit low. Adding on an iron panel to check for anemia.

## 2020-07-23 NOTE — Addendum Note (Signed)
Addended by: Camillia Herter on: 07/23/2020 11:21 AM   Modules accepted: Orders

## 2020-07-24 ENCOUNTER — Telehealth: Payer: Self-pay

## 2020-07-24 NOTE — Telephone Encounter (Signed)
Contacted pt to go over lab results pt is aware and doesn't have any questions or concerns 

## 2020-07-24 NOTE — Progress Notes (Signed)
Please notify patient to increase intake of potassium rich foods such as bananas and oranges.

## 2020-08-08 ENCOUNTER — Telehealth: Payer: Self-pay | Admitting: Licensed Clinical Social Worker

## 2020-08-08 DIAGNOSIS — I10 Essential (primary) hypertension: Secondary | ICD-10-CM

## 2020-08-08 DIAGNOSIS — E785 Hyperlipidemia, unspecified: Secondary | ICD-10-CM

## 2020-08-08 DIAGNOSIS — M545 Low back pain, unspecified: Secondary | ICD-10-CM

## 2020-08-08 MED ORDER — ATORVASTATIN CALCIUM 40 MG PO TABS: 40.0000 mg | ORAL_TABLET | Freq: Every day | ORAL | 2 refills | Status: DC

## 2020-08-08 MED ORDER — METHOCARBAMOL 750 MG PO TABS: ORAL_TABLET | ORAL | 0 refills | Status: DC

## 2020-08-08 MED ORDER — HYDROCHLOROTHIAZIDE 50 MG PO TABS: 50.0000 mg | ORAL_TABLET | Freq: Every day | ORAL | 2 refills | Status: DC

## 2020-08-08 MED ORDER — AMLODIPINE BESYLATE 10 MG PO TABS: 10.0000 mg | ORAL_TABLET | Freq: Every day | ORAL | 2 refills | Status: DC

## 2020-08-08 MED ORDER — DICLOFENAC SODIUM 1 % EX GEL: CUTANEOUS | 1 refills | Status: DC

## 2020-08-08 NOTE — Telephone Encounter (Signed)
Call placed to patient regarding IBH referral. LCSW introduced self and explained role at [facility]. Pt was informed that a referral was placed by provider to address behavioral health and/or resources. Pt shared that he is not sure when would be a good time to schedule an appoitnment. LCSW strongly encouraged pt to contact office to schedule IBH appointment when available. Pt verbalized understanding.   Pt requests that medications prescribed on 07/18/20 be transferred to [facility]. Reports inability to visit [facility]. Pt was reminded of upcoming appointment with CPP. No additional concerns noted.

## 2020-08-08 NOTE — Addendum Note (Signed)
Addended by: Daisy Blossom, Annie Main L on: 08/08/2020 04:10 PM   Modules accepted: Orders

## 2020-08-08 NOTE — Telephone Encounter (Signed)
Rx sent to [facility].

## 2020-08-18 ENCOUNTER — Ambulatory Visit: Payer: Medicaid Other | Admitting: Pharmacist

## 2020-10-24 ENCOUNTER — Ambulatory Visit: Payer: Medicaid Other | Admitting: Family Medicine

## 2020-10-24 ENCOUNTER — Ambulatory Visit: Payer: Medicaid Other | Admitting: Family

## 2020-10-30 ENCOUNTER — Other Ambulatory Visit: Payer: Self-pay | Admitting: Family Medicine

## 2020-10-30 ENCOUNTER — Ambulatory Visit: Payer: Medicaid Other | Admitting: Physician Assistant

## 2020-10-30 DIAGNOSIS — M545 Low back pain, unspecified: Secondary | ICD-10-CM

## 2020-10-30 NOTE — Telephone Encounter (Signed)
Medication Refill - Medication: methocarbamol (ROBAXIN) 750 MG tablet Pt stated he was told his refill would be sent after his last visit / please advise   Has the patient contacted their pharmacy? Yes.   (Agent: If no, request that the patient contact the pharmacy for the refill.) (Agent: If yes, when and what did the pharmacy advise?)  Preferred Pharmacy (with phone number or street name): [facility]: Please be advised that RX refills may take up to 3 business days. We ask that you follow-up with your pharmacy.

## 2020-10-30 NOTE — Telephone Encounter (Signed)
Requested medication (s) are due for refill today:  Yes  Requested medication (s) are on the active medication list:  Yes  Future visit scheduled:  Yes  Last Refill: 08/08/20;  # 42/ 0 refills  Notes to clinic:  medication is not delegated  Requested Prescriptions  Pending Prescriptions Disp Refills   methocarbamol (ROBAXIN) 750 MG tablet 42 tablet 0    Sig: TAKE ONE TABLET BY MOUTH THREE TIMES DAILY as needed for muscle spasm      Not Delegated - Analgesics:  Muscle Relaxants Failed - 10/30/2020  3:27 PM      Failed - This refill cannot be delegated      Passed - Valid encounter within last 6 months    Recent Outpatient Visits           3 months ago Essential hypertension   [place], [place], NP   5 months ago No-show for appointment   [place], [place], NP   10 months ago Essential hypertension   [place], MD   1 year ago Chronic pain of multiple joints   [place], [place], MD   1 year ago Essential hypertension   [place], [place]       Future Appointments             In 1 month Charlott Rakes, MD [place]

## 2020-10-31 ENCOUNTER — Ambulatory Visit: Payer: Medicaid Other | Admitting: Family

## 2020-11-03 MED ORDER — METHOCARBAMOL 750 MG PO TABS: ORAL_TABLET | ORAL | 0 refills | Status: DC

## 2020-12-15 ENCOUNTER — Ambulatory Visit: Payer: Medicaid Other | Admitting: Family Medicine

## 2021-05-23 ENCOUNTER — Other Ambulatory Visit: Payer: Self-pay

## 2021-05-23 ENCOUNTER — Encounter (HOSPITAL_COMMUNITY): Payer: Self-pay | Admitting: *Deleted

## 2021-05-23 ENCOUNTER — Encounter (HOSPITAL_COMMUNITY): Payer: Self-pay

## 2021-05-23 ENCOUNTER — Ambulatory Visit (HOSPITAL_COMMUNITY)
Admission: EM | Admit: 2021-05-23 | Discharge: 2021-05-23 | Payer: Medicaid Other | Attending: Medical Oncology | Admitting: Medical Oncology

## 2021-05-23 ENCOUNTER — Emergency Department (HOSPITAL_COMMUNITY)
Admission: EM | Admit: 2021-05-23 | Discharge: 2021-05-23 | Discharge status: Against Medical Advice | Payer: Medicaid Other | Attending: Emergency Medicine | Admitting: Emergency Medicine

## 2021-05-23 ENCOUNTER — Ambulatory Visit (INDEPENDENT_AMBULATORY_CARE_PROVIDER_SITE_OTHER): Payer: Medicaid Other

## 2021-05-23 ENCOUNTER — Emergency Department (HOSPITAL_COMMUNITY): Payer: Medicaid Other

## 2021-05-23 DIAGNOSIS — Z760 Encounter for issue of repeat prescription: Secondary | ICD-10-CM | POA: Diagnosis not present

## 2021-05-23 DIAGNOSIS — R000 Tachycardia, unspecified: Secondary | ICD-10-CM | POA: Insufficient documentation

## 2021-05-23 DIAGNOSIS — I129 Hypertensive chronic kidney disease with stage 1 through stage 4 chronic kidney disease, or unspecified chronic kidney disease: Secondary | ICD-10-CM | POA: Insufficient documentation

## 2021-05-23 DIAGNOSIS — I160 Hypertensive urgency: Secondary | ICD-10-CM

## 2021-05-23 DIAGNOSIS — Z79899 Other long term (current) drug therapy: Secondary | ICD-10-CM | POA: Insufficient documentation

## 2021-05-23 DIAGNOSIS — N184 Chronic kidney disease, stage 4 (severe): Secondary | ICD-10-CM | POA: Insufficient documentation

## 2021-05-23 DIAGNOSIS — R4182 Altered mental status, unspecified: Secondary | ICD-10-CM

## 2021-05-23 DIAGNOSIS — R030 Elevated blood-pressure reading, without diagnosis of hypertension: Secondary | ICD-10-CM | POA: Diagnosis present

## 2021-05-23 DIAGNOSIS — F17210 Nicotine dependence, cigarettes, uncomplicated: Secondary | ICD-10-CM | POA: Insufficient documentation

## 2021-05-23 DIAGNOSIS — R059 Cough, unspecified: Secondary | ICD-10-CM

## 2021-05-23 DIAGNOSIS — Z7982 Long term (current) use of aspirin: Secondary | ICD-10-CM | POA: Diagnosis not present

## 2021-05-23 DIAGNOSIS — I10 Essential (primary) hypertension: Secondary | ICD-10-CM

## 2021-05-23 DIAGNOSIS — U071 COVID-19: Secondary | ICD-10-CM | POA: Diagnosis not present

## 2021-05-23 LAB — CBC WITH DIFFERENTIAL/PLATELET
Abs Immature Granulocytes: 0.02 10*3/uL (ref 0.00–0.07)
Basophils Absolute: 0 10*3/uL (ref 0.0–0.1)
Basophils Relative: 0 %
Eosinophils Absolute: 0.1 10*3/uL (ref 0.0–0.5)
Eosinophils Relative: 1 %
HCT: 33.5 % — ABNORMAL LOW (ref 39.0–52.0)
Hemoglobin: 10.7 g/dL — ABNORMAL LOW (ref 13.0–17.0)
Immature Granulocytes: 0 %
Lymphocytes Relative: 28 %
Lymphs Abs: 1.9 10*3/uL (ref 0.7–4.0)
MCH: 27.4 pg (ref 26.0–34.0)
MCHC: 31.9 g/dL (ref 30.0–36.0)
MCV: 85.7 fL (ref 80.0–100.0)
Monocytes Absolute: 0.7 10*3/uL (ref 0.1–1.0)
Monocytes Relative: 10 %
Neutro Abs: 4.1 10*3/uL (ref 1.7–7.7)
Neutrophils Relative %: 61 %
Platelets: 324 10*3/uL (ref 150–400)
RBC: 3.91 MIL/uL — ABNORMAL LOW (ref 4.22–5.81)
RDW: 14.5 % (ref 11.5–15.5)
WBC: 6.8 10*3/uL (ref 4.0–10.5)
nRBC: 0 % (ref 0.0–0.2)

## 2021-05-23 LAB — COMPREHENSIVE METABOLIC PANEL
ALT: 10 U/L (ref 0–44)
AST: 16 U/L (ref 15–41)
Albumin: 3.3 g/dL — ABNORMAL LOW (ref 3.5–5.0)
Alkaline Phosphatase: 53 U/L (ref 38–126)
Anion gap: 5 (ref 5–15)
BUN: 18 mg/dL (ref 8–23)
CO2: 24 mmol/L (ref 22–32)
Calcium: 8.9 mg/dL (ref 8.9–10.3)
Chloride: 112 mmol/L — ABNORMAL HIGH (ref 98–111)
Creatinine, Ser: 1.79 mg/dL — ABNORMAL HIGH (ref 0.61–1.24)
GFR, Estimated: 42 mL/min — ABNORMAL LOW (ref >60)
Glucose, Bld: 100 mg/dL — ABNORMAL HIGH (ref 70–99)
Potassium: 3 mmol/L — ABNORMAL LOW (ref 3.5–5.1)
Sodium: 141 mmol/L (ref 135–145)
Total Bilirubin: 1 mg/dL (ref 0.3–1.2)
Total Protein: 6.7 g/dL (ref 6.5–8.1)

## 2021-05-23 LAB — TSH: TSH: 0.56 u[IU]/mL (ref 0.350–4.500)

## 2021-05-23 LAB — RESP PANEL BY RT-PCR (FLU A&B, COVID) ARPGX2
Influenza A by PCR: NEGATIVE
Influenza B by PCR: NEGATIVE
SARS Coronavirus 2 by RT PCR: POSITIVE — AB

## 2021-05-23 LAB — TROPONIN I (HIGH SENSITIVITY): Troponin I (High Sensitivity): 21 ng/L — ABNORMAL HIGH (ref <18)

## 2021-05-23 NOTE — ED Notes (Signed)
Patient is being discharged from the Urgent Care and sent to the Emergency Department via EMS . Per Provider Nelwyn Salisbury, patient is in need of higher level of care due to abnormal EKG. Patient is aware and verbalizes understanding of plan of care.   Vitals:   05/23/21 1408 05/23/21 1457  BP: (!) 201/105 (!) 187/122  Pulse: 65   Resp: 18   Temp: 98.2 F (36.8 C)   SpO2: 100%

## 2021-05-23 NOTE — ED Triage Notes (Signed)
Pt present productive coughing, symptoms started over a month ago.  Pt states sometimes he feels weak.

## 2021-05-23 NOTE — ED Provider Notes (Signed)
[facility] EMERGENCY DEPARTMENT Provider Note   CSN: [id number] Arrival date & time: 05/23/21  1558     History Chief Complaint  Patient presents with   Hypertension   Medication Refill    MADOC HOLQUIN is a 65 y.o. male.  65 y.o male with a PMH of CKD, HTN, Mental disorder, Schizophrenia presents to the ED with a chief complaint of HTN and abnormal EKG from UC.  Patient reports he went to urgent care today in order to have his blood pressure check along to be checked for pneumonia, he states he has been out of his blood pressure medication for about 6 months states "I lost the prescription, was worried about my blood pressure because it is in that way you can fall out of and die ".  He also states that he was here because he thought he had a "virus ".  Blood pressure was noted to be significantly elevated at urgent care with a systolic in the 974B, diastolic in the 638G.  He also states he has been feeling dizzy and had a hard time walking for the past 1 to 2 weeks, he is a very poor historian and difficult to obtain information from.  He denies any fever, chest pain, shortness of breath, abdominal pain.  He is also denying a prior history of schizophrenia, reports no current medications for his mental health.  Previously followed by Dr. Chapman Fitch according to his records, states "I feel like I see someone else now, I have not been in a long time ".  The history is provided by the patient.  Hypertension This is a recurrent problem. Pertinent negatives include no chest pain and no shortness of breath.  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   Stroke (cerebrum) ([place]) 03/09/2017   Hypovitaminosis    Tobacco use    Overdose 10/06/2016   Schizoaffective disorder, depressive type ([place]) 08/27/2016   Cocaine use disorder, mild, abuse ([place]) 08/27/2016   Essential hypertension 08/27/2016   Chronic headache 08/27/2016   Suicidal ideation 02/03/2016   Encephalopathy acute 01/30/2016   CKD (chronic kidney disease) stage 4, GFR 15-29 ml/min ([place]) 01/30/2016   Benign essential HTN 01/30/2016   Cocaine abuse ([place]) 08/21/2012   Hypokalemia 08/21/2012    Past Surgical History:  Procedure Laterality Date   IR IVC FILTER PLMT / S&I /IMG GUID/MOD SED  03/10/2017       Family History  Problem Relation Age of Onset   Cardiomyopathy Sister    Lupus Sister    Kidney failure Sister    Factor VIII deficiency Neg Hx    Mental illness Neg Hx     Social History   Tobacco Use   Smoking status: Every Day    Packs/day: 0.50    Pack years: 0.00    Types: Cigarettes   Smokeless tobacco: Never  Vaping Use   Vaping Use: Never used  Substance Use Topics   Alcohol use: Yes  Comment: 1-2 beers a month   Drug use: Yes    Types: Cocaine, Methamphetamines    Home Medications Prior to Admission medications   Medication Sig Start Date End Date Taking? Authorizing Provider  amLODipine (NORVASC) 10 MG tablet Take 1 tablet (10 mg total) by mouth daily. 08/08/20   Fulp, Cammie, MD  aspirin 325 MG tablet Take 1 tablet (325 mg total) by mouth daily. 03/21/17   Ghimire, Henreitta Leber, MD  atorvastatin (LIPITOR) 40 MG tablet Take 1 tablet (40 mg total) by mouth daily. 08/08/20   Fulp, Cammie, MD  diclofenac Sodium (VOLTAREN) 1 % GEL APPLY TO painful AREAS UP TO FOUR TIMES DAILY 08/08/20   Fulp, Cammie, MD  hydrochlorothiazide (HYDRODIURIL) 50 MG tablet Take 1 tablet (50 mg total) by mouth daily. 08/08/20   Fulp, Cammie, MD  methocarbamol (ROBAXIN) 750 MG tablet TAKE ONE  TABLET BY MOUTH THREE TIMES DAILY as needed for muscle spasm 11/03/20   Charlott Rakes, MD  thiamine 100 MG tablet Take 1 tablet (100 mg total) by mouth daily. 09/01/16   Withrow, Elyse Jarvis, FNP    Allergies    Other  Review of Systems   Review of Systems  Constitutional:  Negative for chills and fever.  Respiratory:  Negative for shortness of breath.   Cardiovascular:  Negative for chest pain.  All other systems reviewed and are negative.  Physical Exam Updated Vital Signs BP (!) 197/116 (BP Location: Right Arm)   Pulse 67   Temp 98 F (36.7 C) (Oral)   Resp 18   SpO2 100%   Physical Exam Vitals and nursing note reviewed.  Constitutional:      Appearance: Normal appearance.  HENT:     Head: Normocephalic and atraumatic.     Nose: Nose normal.  Cardiovascular:     Rate and Rhythm: Normal rate.  Pulmonary:     Effort: Pulmonary effort is normal.  Abdominal:     General: Abdomen is flat.  Musculoskeletal:     Cervical back: Normal range of motion and neck supple.  Skin:    General: Skin is warm and dry.  Neurological:     Mental Status: He is alert and oriented to person, place, and time.     GCS: GCS eye subscore is 4. GCS verbal subscore is 5. GCS motor subscore is 6.     Cranial Nerves: No dysarthria or facial asymmetry.     Motor: No weakness or tremor.     Comments: Moves all upper and lower extremities, no dysarthria, no facial asymmetry.  Sensation is intact to her upper and lower extremities.    ED Results / Procedures / Treatments   Labs (all labs ordered are listed, but only abnormal results are displayed) Labs Reviewed  RESP PANEL BY RT-PCR (FLU A&B, COVID) ARPGX2 - Abnormal; Notable for the following components:      Result Value   SARS Coronavirus 2 by RT PCR POSITIVE (*)    All other components within normal limits  CBC WITH DIFFERENTIAL/PLATELET - Abnormal; Notable for the following components:   RBC 3.91 (*)    Hemoglobin 10.7 (*)    HCT 33.5 (*)     All other components within normal limits  COMPREHENSIVE METABOLIC PANEL - Abnormal; Notable for the following components:   Potassium 3.0 (*)    Chloride 112 (*)    Glucose, Bld 100 (*)    Creatinine, Ser 1.79 (*)    Albumin 3.3 (*)    GFR,  Estimated 42 (*)    All other components within normal limits  TROPONIN I (HIGH SENSITIVITY) - Abnormal; Notable for the following components:   Troponin I (High Sensitivity) 21 (*)    All other components within normal limits  TSH  RAPID URINE DRUG SCREEN, HOSP PERFORMED  TROPONIN I (HIGH SENSITIVITY)    EKG EKG Interpretation  Date/Time:  Saturday May 23 2021 16:45:29 EDT Ventricular Rate:  51 PR Interval:  192 QRS Duration: 90 QT Interval:  450 QTC Calculation: 414 R Axis:   -36 Text Interpretation: Sinus bradycardia Left axis deviation Minimal voltage criteria for LVH, may be normal variant ( R in aVL ) Abnormal ECG Confirmed by Dene Gentry [id number]) on 05/23/2021 6:07:49 PM  Radiology DG Chest 2 View  Result Date: 05/23/2021 CLINICAL DATA:  Productive cough EXAM: CHEST - 2 VIEW COMPARISON:  12/30/2017 FINDINGS: Heart and mediastinal contours are within normal limits. No focal opacities or effusions. No acute bony abnormality. IMPRESSION: No active cardiopulmonary disease. Electronically Signed   By: Rolm Baptise M.D.   On: 05/23/2021 14:44    Procedures Procedures   Medications Ordered in ED Medications - No data to display  ED Course  I have reviewed the triage vital signs and the nursing notes.  Pertinent labs & imaging results that were available during my care of the patient were reviewed by me and considered in my medical decision making (see chart for details).  Clinical Course as of 05/23/21 2112  Sat May 23, 2021  2108 Troponin I (High Sensitivity)(!): 21 [JS]  2109 SARS Coronavirus 2 by RT PCR(!): POSITIVE [JS]    Clinical Course User Index [JS] Janeece Fitting, PA-C   MDM Rules/Calculators/A&P    Patient  denies medical history of schizophrenia, hypertension presents to the ED with a chief complaint of elevated blood pressure, abnormal EKG sent in from urgent care.  Evaluated today, for what he thought was a viral infection along with significant elevated blood pressure with a systolic in the 176H at urgent care, sent in for further evaluation.  Reports he has been on his medication for the past 6 months, states that he feels "like this is how you fall out ".  He is a very poor historian, he is not providing much of his history including denying a prior history of schizophrenia.  During evaluation he is alert and oriented x3, although his thought processes very thorough.  Moves all upper and lower extremities, without any focal weakness noted.  No facial asymmetry, no dysarthria, sensation is intact throughout.  Abdomen is soft nontender to palpation, lungs are clear to auscultation.  Does have a nonproductive cough.  Will obtain blood work in order to rule out hypertensive emergency versus urgency along with metabolic component for altered mental status.  Patient had eloped from the emergency department, I was not notified of him leaving.  I do see a note by RN Hennie Duos, with him walking out around 6 PM.  After evaluation of his labs he was COVID-positive, troponin was 21, with an abnormal EKG.  Unfortunately patient left AGAINST MEDICAL ADVICE and I was unable to complete my work-up.    Portions of this note were generated with [employer]. Dictation errors may occur despite best attempts at proofreading.  Final Clinical Impression(s) / ED Diagnoses Final diagnoses:  Hypertension, unspecified type    Rx / DC Orders ED Discharge Orders     None        Janeece Fitting, PA-C 05/23/21  2112    Valarie Merino, MD 05/24/21 2100

## 2021-05-23 NOTE — ED Triage Notes (Addendum)
Pt here from home for med refill.  He has been out of bp meds x 6 weeks.  Ao x 4, per EMS, though staff at the [facility] felt pt was in hypertensive crisis with bp 184/104.  PT states he has been dizzy and had a hard x walking x 1 or 2 weeks.

## 2021-05-23 NOTE — ED Notes (Signed)
This RN went to get pt to move from hallway to a room and pt left.

## 2021-05-23 NOTE — ED Provider Notes (Signed)
[place]    CSN: [id number] Arrival date & time: 05/23/21  1319      History   Chief Complaint Chief Complaint  Patient presents with   Cough    HPI Darryl Parsons is a 65 y.o. male.   HPI  Cough: Patient reports that he has had a productive cough for the past month. Some dizziness with cough.  He wonders if this is related to old age.  He is a slight poor historian but from what I have gotten from patient he wants to make sure that his heart and lungs sound good as his father died from pneumonia.  He reports no shortness of breath, chest pain, hemoptysis, night sweats, fevers.  He has not tried anything for symptoms.  He does report a history of tobacco use however he quit about 22 years ago according to patient.  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   Stroke (cerebrum) ([place]) 03/09/2017   Hypovitaminosis    Tobacco use    Overdose 10/06/2016   Schizoaffective disorder, depressive type ([place]) 08/27/2016   Cocaine use disorder, mild, abuse ([place]) 08/27/2016   Essential hypertension 08/27/2016   Chronic headache 08/27/2016   Suicidal ideation 02/03/2016   Encephalopathy acute 01/30/2016   CKD (chronic kidney disease) stage 4, GFR 15-29 ml/min (HCC) 01/30/2016   Benign essential HTN 01/30/2016   Cocaine abuse ([place]) 08/21/2012   Hypokalemia 08/21/2012    Past Surgical History:  Procedure Laterality Date   IR IVC FILTER PLMT / S&I /IMG GUID/MOD SED  03/10/2017       Home Medications    Prior to Admission medications   Medication Sig Start Date End Date Taking?  Authorizing Provider  amLODipine (NORVASC) 10 MG tablet Take 1 tablet (10 mg total) by mouth daily. 08/08/20   Fulp, Cammie, MD  aspirin 325 MG tablet Take 1 tablet (325 mg total) by mouth daily. 03/21/17   Ghimire, Henreitta Leber, MD  atorvastatin (LIPITOR) 40 MG tablet Take 1 tablet (40 mg total) by mouth daily. 08/08/20   Fulp, Cammie, MD  diclofenac Sodium (VOLTAREN) 1 % GEL APPLY TO painful AREAS UP TO FOUR TIMES DAILY 08/08/20   Fulp, Cammie, MD  hydrochlorothiazide (HYDRODIURIL) 50 MG tablet Take 1 tablet (50 mg total) by mouth daily. 08/08/20   Fulp, Cammie, MD  methocarbamol (ROBAXIN) 750 MG tablet TAKE ONE TABLET BY MOUTH THREE TIMES DAILY as needed for muscle spasm 11/03/20   Charlott Rakes, MD  thiamine 100 MG tablet Take 1 tablet (100 mg total) by mouth daily. 09/01/16   Withrow, Elyse Jarvis, FNP    Family History Family History  Problem Relation Age of Onset   Cardiomyopathy Sister    Lupus Sister    Kidney failure Sister    Factor VIII deficiency Neg Hx    Mental illness Neg Hx     Social History Social History   Tobacco Use   Smoking status: Every Day    Packs/day: 0.50    Pack years: 0.00  Types: Cigarettes   Smokeless tobacco: Never  Vaping Use   Vaping Use: Never used  Substance Use Topics   Alcohol use: Yes    Comment: 1-2 beers a month   Drug use: Yes    Types: Cocaine     Allergies   Other   Review of Systems Review of Systems  As stated above in HPI Physical Exam Triage Vital Signs ED Triage Vitals  Enc Vitals Group     BP 05/23/21 1408 (!) 201/105     Pulse Rate 05/23/21 1408 65     Resp 05/23/21 1408 18     Temp 05/23/21 1408 98.2 F (36.8 C)     Temp Source 05/23/21 1408 Oral     SpO2 05/23/21 1408 100 %     Weight --      Height --      Head Circumference --      Peak Flow --      Pain Score 05/23/21 1406 0     Pain Loc --      Pain Edu? --      Excl. in [place]? --    No data found.  Updated Vital Signs BP (!) 201/105 (BP Location: Left  Arm)   Pulse 65   Temp 98.2 F (36.8 C) (Oral)   Resp 18   SpO2 100%   Physical Exam Vitals and nursing note reviewed.  Constitutional:      General: He is not in acute distress.    Appearance: Normal appearance. He is not ill-appearing, toxic-appearing or diaphoretic.  HENT:     Head: Normocephalic and atraumatic.     Right Ear: Tympanic membrane, ear canal and external ear normal.     Left Ear: Tympanic membrane, ear canal and external ear normal.     Nose: Nose normal.     Mouth/Throat:     Mouth: Mucous membranes are moist.     Pharynx: Oropharynx is clear. No oropharyngeal exudate or posterior oropharyngeal erythema.  Eyes:     General:        Right eye: No discharge.        Left eye: No discharge.     Extraocular Movements: Extraocular movements intact.     Conjunctiva/sclera: Conjunctivae normal.     Pupils: Pupils are equal, round, and reactive to light.  Cardiovascular:     Rate and Rhythm: Normal rate and regular rhythm.     Heart sounds: Normal heart sounds.  Pulmonary:     Effort: Pulmonary effort is normal.     Breath sounds: Normal breath sounds.  Musculoskeletal:     Cervical back: Normal range of motion and neck supple.  Lymphadenopathy:     Cervical: No cervical adenopathy.  Psychiatric:     Comments: Patient denied coming with the guardian today.  Patient is orientated x 3 but thought process is not very clear.  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  He also states that he has some chronic pains that he attributes to old age and that he is not concerned with his chest congestion and cough even though that is why he first arrived for his visit.      UC Treatments / Results  Labs (all labs ordered are listed, but only abnormal results are displayed) Labs Reviewed - No data to display  EKG   Radiology DG Chest 2 View  Result Date: 05/23/2021 CLINICAL DATA:  Productive cough EXAM: CHEST -  2 VIEW  COMPARISON:  12/30/2017 FINDINGS: Heart and mediastinal contours are within normal limits. No focal opacities or effusions. No acute bony abnormality. IMPRESSION: No active cardiopulmonary disease. Electronically Signed   By: Rolm Baptise M.D.   On: 05/23/2021 14:44    Procedures Procedures (including critical care time)  Medications Ordered in UC Medications - No data to display  Initial Impression / Assessment and Plan / UC Course  I have reviewed the triage vital signs and the nursing notes.  Pertinent labs & imaging results that were available during my care of the patient were reviewed by me and considered in my medical decision making (see chart for details).     New. His chest x-ray does not show any cause or abnormality.  There is concern for confusion from potential end organ damage from his elevated blood pressure reading. On recheck his blood pressure is still much higher than target range especially given his PMH and comorbidity.  As I do not have any baseline for his mental status, dizziness and given his vital signs and behavior I do recommend that he be evaluated further in the emergency room.  I have discussed this with patient. Final Clinical Impressions(s) / UC Diagnoses   Final diagnoses:  None   Discharge Instructions   None    ED Prescriptions   None    PDMP not reviewed this encounter.   Hughie Closs, [place] 05/23/21 1519

## 2021-05-23 NOTE — ED Notes (Signed)
Patient stated "all y'all need to do is give me a pill Y'all can't hold me captive and I can leave if I want"

## 2021-06-01 ENCOUNTER — Other Ambulatory Visit: Payer: Self-pay

## 2021-06-01 ENCOUNTER — Ambulatory Visit: Payer: Medicaid Other | Attending: Critical Care Medicine | Admitting: Critical Care Medicine

## 2021-06-01 ENCOUNTER — Encounter: Payer: Self-pay | Admitting: Critical Care Medicine

## 2021-06-01 DIAGNOSIS — I10 Essential (primary) hypertension: Secondary | ICD-10-CM

## 2021-06-01 DIAGNOSIS — E876 Hypokalemia: Secondary | ICD-10-CM

## 2021-06-01 DIAGNOSIS — F1411 Cocaine abuse, in remission: Secondary | ICD-10-CM

## 2021-06-01 DIAGNOSIS — N1832 Chronic kidney disease, stage 3b: Secondary | ICD-10-CM | POA: Diagnosis not present

## 2021-06-01 DIAGNOSIS — M545 Low back pain, unspecified: Secondary | ICD-10-CM | POA: Diagnosis not present

## 2021-06-01 DIAGNOSIS — Z87891 Personal history of nicotine dependence: Secondary | ICD-10-CM

## 2021-06-01 DIAGNOSIS — Z8673 Personal history of transient ischemic attack (TIA), and cerebral infarction without residual deficits: Secondary | ICD-10-CM

## 2021-06-01 MED ORDER — METHOCARBAMOL 750 MG PO TABS
ORAL_TABLET | ORAL | 0 refills | Status: AC
Start: 2021-06-01 — End: ?

## 2021-06-01 MED ORDER — AMLODIPINE BESYLATE 10 MG PO TABS: 10.0000 mg | ORAL_TABLET | Freq: Every day | ORAL | 2 refills | Status: AC

## 2021-06-01 NOTE — Assessment & Plan Note (Signed)
Patient states he is currently no longer using tobacco products

## 2021-06-01 NOTE — Assessment & Plan Note (Signed)
History of chronic recurrent hypokalemia will also need to prescribe potassium supplementation

## 2021-06-01 NOTE — Assessment & Plan Note (Signed)
Patient states he is no longer using active cocaine

## 2021-06-01 NOTE — Progress Notes (Addendum)
New Patient Office Visit  Subjective:  Patient ID: Darryl Parsons, male    DOB: 06-08-1956  Age: 65 y.o. MRN: [id number] Virtual Visit via Telephone Note  I connected with Darryl Parsons on 06/10/21 at  4:00 PM EDT by telephone and verified that I am speaking with the correct person using two identifiers.   Consent:  I discussed the limitations, risks, security and privacy concerns of performing an evaluation and management service by telephone and the availability of in person appointments. I also discussed with the patient that there may be a patient responsible charge related to this service. The patient expressed understanding and agreed to proceed.  Location of patient: Patient's in his motel room  Location of provider: I am in my office  Persons participating in the televisit with the patient.   No one else on the call    History of Present Illness: CC: Primary care to establish  HPI Darryl Parsons presents for primary care to establish, the patient is in his hotel room for this visit does not have technology for video.  Patient was in the emergency room recently on 9 July with cough and some shortness of breath he was COVID-positive but he did not stay for full assessment.  Had blood pressure elevated 176/118.  He is now only on the amlodipine only has a few tablets left.  He denies any shortness of breath now his cough is resolved he has no gastrointestinal problems he can taste and smell perfectly has no fever or headaches.  He currently is not smoking.  He does not use Illig illicit substances.  He only drinks alcohol occasionally.  He has been living in the same motel room for the past 5 years.  He does take methocarbamol for muscle relaxation in the back and is requesting refill of this as well.  He is a former Dr. Chapman Parsons patient has not been in the clinic since September 2021.  Below is documentation of that visit.  07/2020 Darryl Parsons is a 65 y.o. 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   1. HYPERTENSION FOLLOW-UP: 12/19/2019: Visit with Dr. Chapman Parsons. During that encounter continued on Amlodipine and Hydrochlorothiazide (patient's preference to take high dose HCTZ). CMP obtained. Follow-up in 4 weeks with clinical pharmacist for blood pressure check.   07/18/2020: Currently taking: see medication list Have you taken your blood pressure medication today: '[]'  Yes '[x]'  No Med Adherence: '[]'  Yes    '[x]'  No, reports it has been at least1 month since he took any medication because he lost the medication Adherence with salt restriction: '[]'  Yes    '[x]'  No Exercise: Yes '[x]'  No '[]'  Home Monitoring?: '[]'  Yes    '[x]'  No Monitoring Frequency: '[]'  Yes    '[x]'  No Home BP results range: '[]'  Yes    '[x]'  No Smoking '[x]'  Yes, 0.5 pack daily and reports he rolls his own cigarettes  SOB? '[]'  Yes    '[x]'  No Chest Pain?: '[x]'  Yes, sometimes  Leg swelling?: '[]'  Yes    '[x]'  No Headaches?: '[x]'  Yes    '[]'  No Dizziness? '[]'  Yes    '[x]'  No   2. CHRONIC KIDNEY DISEASE FOLLOW-UP: 12/19/2019: Visit with Dr. Chapman Parsons. During that encounter discussed need to avoid nonsteroidal anti-inflammatory medications and avoid dehydration. Patient insistent  on receiving refill of Indomethacin for chronic joint pain although he is aware medication could worsen his renal function. CMP and CBC obtained. Discussed if he had any significant worsening of chronic kidney disease that a Nephrology referral would be made.   3. HYPERLIPIDEMIA FOLLOW-UP: 12/19/2019: Visit with Dr. Chapman Parsons. During that encounter patient had stopped use of Atorvastatin. Lipid panel and CMP obtained.   07/18/2020: Last Lipid Panel results:  Last Labs       HDL  Date Value Ref Range Status  07/25/2019 56  >39 mg/dL Final         Triglycerides  Date Value Ref Range Status  07/25/2019 81 0 - 149 mg/dL Final      Are you fasting today: '[]'  Yes '[x]'  No, had oatmeal and beer  Med Adherence: '[]'  Yes    '[x]'  No, never took it states "I don't have high cholesterol." Diet Adherence: '[]'  Yes    '[x]'  No   4. CHRONIC PAIN FOLLOW-UP: 12/19/2019: Visit with Dr. Chapman Parsons. During that encounter patient with chronic pain of multiple joints. Discussed with patient diagnosis of chronic kidney disease with elevated creatinine and decreased renal function and that this could be worsened with the use of nonsteroidal anti-inflammatories and medications such as Indomethacin. Patient felt Indomethacin is the only medication that will help with his pain and provided with a limited supply.   07/18/2020: Today patient reports pain is about the same and would like a refill of Indomethacin.   5. HOUSING PROBLEM: Reports he is living at the [facility] related to financial reasons. Today showing pictures of what appears to be bedbugs on the hotel mattress and hotel linen. Reports the ceiling is falling in and recently he found a snake in the bathroom. Reports he has complained to hotel management with no success.  Essential hypertension: - Uncontrolled. - Blood pressure elevated during today's visit. Patient asymptomatic without chest pressure, chest pain, palpitations, and shortness of breath.  - Patient reports he has not taken blood pressure medication for at least 1 month because he lost the medication. - Initial blood pressure from RN 177/105. Provider rechecked blood pressure which resulted 165/112. - Clonidine 0.1 mg for one dose administered in clinic. Blood pressure rechecked and resulted 168/107. - Patient has been without antihypertensives for at least 1 month which is likely the reason blood pressure is uncontrolled and did not significantly decrease with Clonidine administration.  - CMP to check kidney function, liver  function, and electrolyte balance. - CBC to check blood count for anemia. - Begin taking Hydrochlorothiazide and Amlodipine as prescribed. - Follow-up with in 4 weeks with clinical pharmacist for blood pressure check. Write down your blood pressure readings each day and bring those results along with your home blood pressure monitor to your appointment. Medications may be adjusted at that time if needed. - Follow-up with primary physician in 3 months or sooner if needed. - Patient was given clear instructions to go to Emergency Department or return to medical center if symptoms don't improve, worsen, or new problems develop.The patient verbalized understanding. - CMP14+EGFR - CBC With Differential- cloNIDine (CATAPRES) tablet 0.1 mg - amLODipine (NORVASC) 10 MG tablet; Take 1 tablet (10 mg total) by mouth daily.  Dispense: 30 tablet; Refill: 2 - hydrochlorothiazide (HYDRODIURIL) 50 MG tablet; Take 1 tablet (50 mg total) by mouth daily.  Dispense: 30 tablet; Refill: 2 - Amb Referral to Clinical Pharmacist   2. Stage 3 chronic kidney disease, unspecified whether stage 3a or 3b  CKD: - CMP to check kidney function, liver function, and electrolyte balance. - CBC to check blood count for anemia. - Counseled patient to avoid nonsteroidal anti-inflammatory medications and avoid dehydration. - If any significant worsening of chronic kidney disease Nephrology referral will be made. - Follow-up with primary physician in 3 months or sooner if needed.  - CMP14+EGFR - CBC With Differential   3. Hyperlipidemia, unspecified hyperlipidemia type: - Practice low-fat heart healthy diet and at least 150 minutes of moderate intensity exercise weekly as tolerated.  - Continue Atorvastatin as prescribed. Patient reports he does not believe he had high cholesterol in the past and reports never taking the medication because he does not feel as if he needs it. - Return within 1 week for lipid panel. Be sure to be  fasting, meaning nothing to eat or drink, at least 8 to 12 hours prior to appointment. - Follow-up with primary physician in 3 months or sooner if needed.  - Lipid panel; Future - atorvastatin (LIPITOR) 40 MG tablet; Take 1 tablet (40 mg total) by mouth daily.  Dispense: 30 tablet; Refill: 2   4. Low back pain with radiation: - Patient with chronic low back pain with radiation. - Continue Diclofenac gel and Methocarbamol as prescribed. - Counseled patient Indomethacin will not be prescribed considering the patient has chronic kidney disease which can be worsened by nonsteroidal anti-inflammatories. - Follow-up with primary physician in 3 months or sooner if needed.  - diclofenac Sodium (VOLTAREN) 1 % GEL; APPLY TO painful AREAS UP TO FOUR TIMES DAILY  Dispense: 100 g; Refill: 1 - methocarbamol (ROBAXIN) 750 MG tablet; TAKE ONE TABLET BY MOUTH THREE TIMES DAILY as needed for muscle spasm  Dispense: 42 tablet; Refill: 0   5. Housing problems: - Reports he is living at the [employer] related to financial reasons. Today showing pictures on cellphone of what appears to be bedbugs on the hotel mattress and hotel linen. Reports the ceiling is falling in and recently he found a snake in the bathroom. Reports he has complained to hotel management with no success. - Requesting to sign a release of medical records to his lawyer for litigation. - Referral to Social Work for counseling and community resources.  - Ambulatory referral to Social Work   Patient requesting to reestablish in the clinic itself Past Medical History:  Diagnosis Date   Chronic kidney disease    Cocaine use disorder, mild, abuse ([place]) 08/27/2016   Headache    Hypertension    Low back pain    Mental disorder    Occlusion of posterior inferior cerebellar artery with infarction, left (HCC)    Schizophrenia (HCC)    Stroke (cerebrum) ([place]) 03/09/2017    Past Surgical History:  Procedure Laterality Date   IR IVC FILTER PLMT  / S&I /IMG GUID/MOD SED  03/10/2017    Family History  Problem Relation Age of Onset   Cardiomyopathy Sister    Lupus Sister    Kidney failure Sister    Factor VIII deficiency Neg Hx    Mental illness Neg Hx     Social History   Socioeconomic History   Marital status: Single    Spouse name: Not on file   Number of children: Not on file   Years of education: Not on file   Highest education level: Not on file  Occupational History   Not on file  Tobacco Use   Smoking status: Former    Packs/day: 0.50  Types: Cigarettes   Smokeless tobacco: Never  Vaping Use   Vaping Use: Never used  Substance and Sexual Activity   Alcohol use: Yes    Comment: 1-2 beers a month   Drug use: Not Currently    Types: Cocaine, Methamphetamines   Sexual activity: Never  Other Topics Concern   Not on file  Social History Narrative   Not on file   Social Determinants of Health   Financial Resource Strain: Not on file  Food Insecurity: Not on file  Transportation Needs: Not on file  Physical Activity: Not on file  Stress: Not on file  Social Connections: Not on file  Intimate Partner Violence: Not on file    ROS Review of Systems  HENT: Negative.    Respiratory:  Negative for cough and shortness of breath.   Cardiovascular:  Negative for chest pain and palpitations.  Genitourinary: Negative.  Negative for difficulty urinating.  Musculoskeletal: Negative.   Skin: Negative.   Neurological:  Negative for headaches.  Psychiatric/Behavioral:  Negative for self-injury and suicidal ideas.    Objective:   Today's Vitals: There were no vitals taken for this visit.  Physical Exam No exam this is a phone visit Assessment & Plan:   Problem List Items Addressed This Visit       Cardiovascular and Mediastinum   Essential hypertension - Primary (Chronic)    History of hypertension not well controlled suspect is not controlled now he has no way of measuring the blood pressure in his  room  Plan is to renew amlodipine 10 mg daily and we will get this patient to the clinic in the next week for assessment he was reluctant for me to prescribe other medicines over the phone and I will need to see him face-to-face to engage him       Relevant Medications   amLODipine (NORVASC) 10 MG tablet   History of ischemic multifocal posterior circulation stroke    History of prior stroke will need reassessment when he comes to the office       Relevant Medications   amLODipine (NORVASC) 10 MG tablet     Genitourinary   CKD (chronic kidney disease) stage 3, GFR 30-59 ml/min (HCC)    Chronic kidney disease currently stage III with a creatinine of 1.79 putting his estimated GFR at 42 for African-American  We will bring the patient back to the office for repeat testing  Will need to avoid ACE and ARB medications in this patient  Likely he will need clonidine in addition to beta-blockade in addition to amlodipine         Other   History of cocaine abuse ([place])    Patient states he is no longer using active cocaine       Hypokalemia    History of chronic recurrent hypokalemia will also need to prescribe potassium supplementation       Former tobacco use    Patient states he is currently no longer using tobacco products       Other Visit Diagnoses     Low back pain with radiation       Relevant Medications   methocarbamol (ROBAXIN) 750 MG tablet       Outpatient Encounter Medications as of 06/01/2021  Medication Sig   [DISCONTINUED] amLODipine (NORVASC) 10 MG tablet Take 1 tablet (10 mg total) by mouth daily.   [DISCONTINUED] methocarbamol (ROBAXIN) 750 MG tablet TAKE ONE TABLET BY MOUTH THREE TIMES DAILY as needed for muscle spasm  amLODipine (NORVASC) 10 MG tablet Take 1 tablet (10 mg total) by mouth daily.   methocarbamol (ROBAXIN) 750 MG tablet TAKE ONE TABLET BY MOUTH THREE TIMES DAILY as needed for muscle spasm   [DISCONTINUED] aspirin 325 MG tablet Take  1 tablet (325 mg total) by mouth daily. (Patient not taking: Reported on 06/01/2021)   [DISCONTINUED] atorvastatin (LIPITOR) 40 MG tablet Take 1 tablet (40 mg total) by mouth daily. (Patient not taking: Reported on 06/01/2021)   [DISCONTINUED] diclofenac Sodium (VOLTAREN) 1 % GEL APPLY TO painful AREAS UP TO FOUR TIMES DAILY (Patient not taking: Reported on 06/01/2021)   [DISCONTINUED] hydrochlorothiazide (HYDRODIURIL) 50 MG tablet Take 1 tablet (50 mg total) by mouth daily. (Patient not taking: Reported on 06/01/2021)   [DISCONTINUED] thiamine 100 MG tablet Take 1 tablet (100 mg total) by mouth daily. (Patient not taking: Reported on 06/01/2021)   No facility-administered encounter medications on file as of 06/01/2021.    Follow-up: July 27  Follow Up Instructions: Patient knows refills on his blood pressure medicine will be sent to [facility] on [address]. and he will have a direct office exam scheduled within the next week on July 27 11 AM   I discussed the assessment and treatment plan with the patient. The patient was provided an opportunity to ask questions and all were answered. The patient agreed with the plan and demonstrated an understanding of the instructions.   The patient was advised to call back or seek an in-person evaluation if the symptoms worsen or if the condition fails to improve as anticipated.  I provided 28 minutes of non-face-to-face time during this encounter  including  median intraservice time , review of notes, labs, imaging, medications  and explaining diagnosis and management to the patient .       Asencion Noble, MD

## 2021-06-01 NOTE — Assessment & Plan Note (Signed)
History of hypertension not well controlled suspect is not controlled now he has no way of measuring the blood pressure in his room  Plan is to renew amlodipine 10 mg daily and we will get this patient to the clinic in the next week for assessment he was reluctant for me to prescribe other medicines over the phone and I will need to see him face-to-face to engage him

## 2021-06-01 NOTE — Assessment & Plan Note (Signed)
Chronic kidney disease currently stage III with a creatinine of 1.79 putting his estimated GFR at 42 for African-American  We will bring the patient back to the office for repeat testing  Will need to avoid ACE and ARB medications in this patient  Likely he will need clonidine in addition to beta-blockade in addition to amlodipine

## 2021-06-01 NOTE — Assessment & Plan Note (Signed)
History of prior stroke will need reassessment when he comes to the office

## 2021-06-10 ENCOUNTER — Ambulatory Visit: Payer: Medicaid Other | Admitting: Critical Care Medicine

## 2021-06-10 NOTE — Progress Notes (Deleted)
New Patient Office Visit  Subjective:  Patient ID: Darryl Parsons, male    DOB: 26-Aug-1956  Age: 65 y.o. MRN: [id number]    History of Present Illness: CC: Primary care to establish  HPI Darryl Parsons presents for primary care to establish, the patient is in his hotel room for this visit does not have technology for video.  Patient was in the emergency room recently on 9 July with cough and some shortness of breath he was COVID-positive but he did not stay for full assessment.  Had blood pressure elevated 176/118.  He is now only on the amlodipine only has a few tablets left.  He denies any shortness of breath now his cough is resolved he has no gastrointestinal problems he can taste and smell perfectly has no fever or headaches.  He currently is not smoking.  He does not use Illig illicit substances.  He only drinks alcohol occasionally.  He has been living in the same motel room for the past 5 years.  He does take methocarbamol for muscle relaxation in the back and is requesting refill of this as well.  He is a former Dr. Chapman Fitch patient has not been in the clinic since September 2021.  Below is documentation of that visit.  07/2020 Ante Darryl Parsons is a 66 y.o. 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   1. HYPERTENSION FOLLOW-UP: 12/19/2019: Visit with Dr. Chapman Fitch. During that encounter continued on Amlodipine and Hydrochlorothiazide (patient's preference to take high dose HCTZ). CMP obtained. Follow-up in 4 weeks with clinical pharmacist for blood pressure check.   07/18/2020: Currently taking: see medication list Have you taken your blood pressure medication today: '[]'  Yes '[x]'  No Med Adherence: '[]'  Yes    '[x]'  No, reports it has been at  least1 month since he took any medication because he lost the medication Adherence with salt restriction: '[]'  Yes    '[x]'  No Exercise: Yes '[x]'  No '[]'  Home Monitoring?: '[]'  Yes    '[x]'  No Monitoring Frequency: '[]'  Yes    '[x]'  No Home BP results range: '[]'  Yes    '[x]'  No Smoking '[x]'  Yes, 0.5 pack daily and reports he rolls his own cigarettes  SOB? '[]'  Yes    '[x]'  No Chest Pain?: '[x]'  Yes, sometimes  Leg swelling?: '[]'  Yes    '[x]'  No Headaches?: '[x]'  Yes    '[]'  No Dizziness? '[]'  Yes    '[x]'  No   2. CHRONIC KIDNEY DISEASE FOLLOW-UP: 12/19/2019: Visit with Dr. Chapman Fitch. During that encounter discussed need to avoid nonsteroidal anti-inflammatory medications and avoid dehydration. Patient insistent on receiving refill of Indomethacin for chronic joint pain although he is aware medication could worsen his renal function. CMP and CBC obtained. Discussed if he had any significant worsening of chronic kidney disease that a Nephrology referral would be made.   3. HYPERLIPIDEMIA FOLLOW-UP: 12/19/2019: Visit with Dr. Chapman Fitch. During that encounter patient had stopped use of Atorvastatin. Lipid panel and CMP obtained.   07/18/2020: Last Lipid Panel results:  Last Labs       HDL  Date Value Ref Range Status  07/25/2019 56 >39 mg/dL Final         Triglycerides  Date Value Ref Range Status  07/25/2019 81 0 - 149 mg/dL Final      Are you  fasting today: '[]'  Yes '[x]'  No, had oatmeal and beer  Med Adherence: '[]'  Yes    '[x]'  No, never took it states "I don't have high cholesterol." Diet Adherence: '[]'  Yes    '[x]'  No   4. CHRONIC PAIN FOLLOW-UP: 12/19/2019: Visit with Dr. Chapman Fitch. During that encounter patient with chronic pain of multiple joints. Discussed with patient diagnosis of chronic kidney disease with elevated creatinine and decreased renal function and that this could be worsened with the use of nonsteroidal anti-inflammatories and medications such as Indomethacin. Patient felt Indomethacin is the only medication that will help  with his pain and provided with a limited supply.   07/18/2020: Today patient reports pain is about the same and would like a refill of Indomethacin.   5. HOUSING PROBLEM: Reports he is living at the [facility] related to financial reasons. Today showing pictures of what appears to be bedbugs on the hotel mattress and hotel linen. Reports the ceiling is falling in and recently he found a snake in the bathroom. Reports he has complained to hotel management with no success.  Essential hypertension: - Uncontrolled. - Blood pressure elevated during today's visit. Patient asymptomatic without chest pressure, chest pain, palpitations, and shortness of breath.  - Patient reports he has not taken blood pressure medication for at least 1 month because he lost the medication. - Initial blood pressure from RN 177/105. Provider rechecked blood pressure which resulted 165/112. - Clonidine 0.1 mg for one dose administered in clinic. Blood pressure rechecked and resulted 168/107. - Patient has been without antihypertensives for at least 1 month which is likely the reason blood pressure is uncontrolled and did not significantly decrease with Clonidine administration.  - CMP to check kidney function, liver function, and electrolyte balance. - CBC to check blood count for anemia. - Begin taking Hydrochlorothiazide and Amlodipine as prescribed. - Follow-up with in 4 weeks with clinical pharmacist for blood pressure check. Write down your blood pressure readings each day and bring those results along with your home blood pressure monitor to your appointment. Medications may be adjusted at that time if needed. - Follow-up with primary physician in 3 months or sooner if needed. - Patient was given clear instructions to go to Emergency Department or return to medical center if symptoms don't improve, worsen, or new problems develop.The patient verbalized understanding. - CMP14+EGFR - CBC With Differential- cloNIDine  (CATAPRES) tablet 0.1 mg - amLODipine (NORVASC) 10 MG tablet; Take 1 tablet (10 mg total) by mouth daily.  Dispense: 30 tablet; Refill: 2 - hydrochlorothiazide (HYDRODIURIL) 50 MG tablet; Take 1 tablet (50 mg total) by mouth daily.  Dispense: 30 tablet; Refill: 2 - Amb Referral to Clinical Pharmacist   2. Stage 3 chronic kidney disease, unspecified whether stage 3a or 3b CKD: - CMP to check kidney function, liver function, and electrolyte balance. - CBC to check blood count for anemia. - Counseled patient to avoid nonsteroidal anti-inflammatory medications and avoid dehydration. - If any significant worsening of chronic kidney disease Nephrology referral will be made. - Follow-up with primary physician in 3 months or sooner if needed.  - CMP14+EGFR - CBC With Differential   3. Hyperlipidemia, unspecified hyperlipidemia type: - Practice low-fat heart healthy diet and at least 150 minutes of moderate intensity exercise weekly as tolerated.  - Continue Atorvastatin as prescribed. Patient reports he does not believe he had high cholesterol in the past and reports never taking the medication because he does not feel as if he needs it. -  Return within 1 week for lipid panel. Be sure to be fasting, meaning nothing to eat or drink, at least 8 to 12 hours prior to appointment. - Follow-up with primary physician in 3 months or sooner if needed.  - Lipid panel; Future - atorvastatin (LIPITOR) 40 MG tablet; Take 1 tablet (40 mg total) by mouth daily.  Dispense: 30 tablet; Refill: 2   4. Low back pain with radiation: - Patient with chronic low back pain with radiation. - Continue Diclofenac gel and Methocarbamol as prescribed. - Counseled patient Indomethacin will not be prescribed considering the patient has chronic kidney disease which can be worsened by nonsteroidal anti-inflammatories. - Follow-up with primary physician in 3 months or sooner if needed.  - diclofenac Sodium (VOLTAREN) 1 % GEL; APPLY  TO painful AREAS UP TO FOUR TIMES DAILY  Dispense: 100 g; Refill: 1 - methocarbamol (ROBAXIN) 750 MG tablet; TAKE ONE TABLET BY MOUTH THREE TIMES DAILY as needed for muscle spasm  Dispense: 42 tablet; Refill: 0   5. Housing problems: - Reports he is living at the [employer] related to financial reasons. Today showing pictures on cellphone of what appears to be bedbugs on the hotel mattress and hotel linen. Reports the ceiling is falling in and recently he found a snake in the bathroom. Reports he has complained to hotel management with no success. - Requesting to sign a release of medical records to his lawyer for litigation. - Referral to Social Work for counseling and community resources.  - Ambulatory referral to Social Work   Patient requesting to reestablish in the clinic itself Past Medical History:  Diagnosis Date   Chronic kidney disease    Cocaine use disorder, mild, abuse ([place]) 08/27/2016   Headache    Hypertension    Low back pain    Mental disorder    Occlusion of posterior inferior cerebellar artery with infarction, left (HCC)    Schizophrenia (HCC)    Stroke (cerebrum) ([place]) 03/09/2017    Past Surgical History:  Procedure Laterality Date   IR IVC FILTER PLMT / S&I /IMG GUID/MOD SED  03/10/2017    Family History  Problem Relation Age of Onset   Cardiomyopathy Sister    Lupus Sister    Kidney failure Sister    Factor VIII deficiency Neg Hx    Mental illness Neg Hx     Social History   Socioeconomic History   Marital status: Single    Spouse name: Not on file   Number of children: Not on file   Years of education: Not on file   Highest education level: Not on file  Occupational History   Not on file  Tobacco Use   Smoking status: Former    Packs/day: 0.50    Types: Cigarettes   Smokeless tobacco: Never  Vaping Use   Vaping Use: Never used  Substance and Sexual Activity   Alcohol use: Yes    Comment: 1-2 beers a month   Drug use: Not Currently     Types: Cocaine, Methamphetamines   Sexual activity: Never  Other Topics Concern   Not on file  Social History Narrative   Not on file   Social Determinants of Health   Financial Resource Strain: Not on file  Food Insecurity: Not on file  Transportation Needs: Not on file  Physical Activity: Not on file  Stress: Not on file  Social Connections: Not on file  Intimate Partner Violence: Not on file    ROS Review of  Systems  HENT: Negative.    Respiratory:  Negative for cough and shortness of breath.   Cardiovascular:  Negative for chest pain and palpitations.  Genitourinary: Negative.  Negative for difficulty urinating.  Musculoskeletal: Negative.   Skin: Negative.   Neurological:  Negative for headaches.  Psychiatric/Behavioral:  Negative for self-injury and suicidal ideas.    Objective:   Today's Vitals: There were no vitals taken for this visit.  Physical Exam No exam this is a phone visit Assessment & Plan:   Problem List Items Addressed This Visit   None  Outpatient Encounter Medications as of 06/10/2021  Medication Sig   amLODipine (NORVASC) 10 MG tablet Take 1 tablet (10 mg total) by mouth daily.   methocarbamol (ROBAXIN) 750 MG tablet TAKE ONE TABLET BY MOUTH THREE TIMES DAILY as needed for muscle spasm   No facility-administered encounter medications on file as of 06/10/2021.    Follow-up: July 27  Follow Up Instructions: Patient knows refills on his blood pressure medicine will be sent to [facility] on [address]. and he will have a direct office exam scheduled within the next week on July 27 11 AM   I discussed the assessment and treatment plan with the patient. The patient was provided an opportunity to ask questions and all were answered. The patient agreed with the plan and demonstrated an understanding of the instructions.   The patient was advised to call back or seek an in-person evaluation if the symptoms worsen or if the condition fails  to improve as anticipated.  I provided 28 minutes of non-face-to-face time during this encounter  including  median intraservice time , review of notes, labs, imaging, medications  and explaining diagnosis and management to the patient .       Asencion Noble, MD

## 2021-06-29 ENCOUNTER — Other Ambulatory Visit: Payer: Self-pay

## 2021-06-29 ENCOUNTER — Emergency Department (HOSPITAL_COMMUNITY): Payer: Medicaid Other

## 2021-06-29 ENCOUNTER — Encounter (HOSPITAL_COMMUNITY): Payer: Self-pay

## 2021-06-29 ENCOUNTER — Emergency Department (HOSPITAL_COMMUNITY)
Admission: EM | Admit: 2021-06-29 | Discharge: 2021-06-29 | Disposition: A | Discharge status: Home / Self Care | Payer: Medicaid Other | Attending: Student | Admitting: Student

## 2021-06-29 DIAGNOSIS — R200 Anesthesia of skin: Secondary | ICD-10-CM | POA: Diagnosis present

## 2021-06-29 DIAGNOSIS — Z5321 Procedure and treatment not carried out due to patient leaving prior to being seen by health care provider: Secondary | ICD-10-CM | POA: Diagnosis not present

## 2021-06-29 DIAGNOSIS — R600 Localized edema: Secondary | ICD-10-CM | POA: Diagnosis not present

## 2021-06-29 NOTE — ED Triage Notes (Signed)
Pt reports was told to come to ER to r/o MI by UC. Patient reports numbness to right leg and pitting edema to bilateral lower extremities x2 months. Pt denies cp/shob.

## 2021-06-29 NOTE — ED Provider Notes (Signed)
Emergency Medicine Provider Triage Evaluation Note  Darryl Parsons , a 65 y.o. male  was evaluated in triage.  Pt complains of lower extremity edema, dizziness, and difficulties with ambulation x2 months. Denies associated chest pain and shortness of breath. Difficult to obtain HPI from patient due to tangential speech.   Review of Systems  Positive: dizziness Negative: CP/SOB  Physical Exam  BP (!) 187/124   Pulse 78   Temp 98 F (36.7 C)   Resp 20   Ht 5\' 8"  (1.727 m)   Wt 78 kg   SpO2 100%   BMI 26.15 kg/m  Gen:   Awake, no distress   Resp:  Normal effort  MSK:   Moves extremities without difficulty  Other:    Medical Decision Making  Medically screening exam initiated at 4:08 PM.  Appropriate orders placed.  Darryl Parsons was informed that the remainder of the evaluation will be completed by another provider, this initial triage assessment does not replace that evaluation, and the importance of remaining in the ED until their evaluation is complete.  Labs BNP CT head   Karie Kirks 06/29/21 1610    Varney Biles, MD 06/30/21 1106

## 2021-09-02 ENCOUNTER — Other Ambulatory Visit: Payer: Self-pay

## 2021-09-02 ENCOUNTER — Encounter: Payer: Self-pay | Admitting: Critical Care Medicine

## 2021-09-02 ENCOUNTER — Ambulatory Visit: Payer: Medicaid Other | Attending: Critical Care Medicine | Admitting: Critical Care Medicine

## 2021-09-02 DIAGNOSIS — Z91199 Patient's noncompliance with other medical treatment and regimen due to unspecified reason: Secondary | ICD-10-CM

## 2021-09-02 NOTE — Progress Notes (Signed)
Pt was a no show

## 2021-10-07 ENCOUNTER — Ambulatory Visit: Payer: Medicaid Other | Admitting: Critical Care Medicine

## 2021-12-15 ENCOUNTER — Inpatient Hospital Stay (HOSPITAL_COMMUNITY): Payer: Medicaid Other

## 2021-12-15 ENCOUNTER — Emergency Department (HOSPITAL_COMMUNITY): Payer: Medicaid Other

## 2021-12-15 ENCOUNTER — Inpatient Hospital Stay (HOSPITAL_COMMUNITY)
Admission: EM | Admit: 2021-12-15 | Discharge: 2022-01-13 | DRG: 870 | Disposition: E | Payer: Medicaid Other | Attending: Pulmonary Disease | Admitting: Pulmonary Disease

## 2021-12-15 ENCOUNTER — Other Ambulatory Visit: Payer: Self-pay

## 2021-12-15 DIAGNOSIS — T1490XA Injury, unspecified, initial encounter: Secondary | ICD-10-CM

## 2021-12-15 DIAGNOSIS — J189 Pneumonia, unspecified organism: Secondary | ICD-10-CM | POA: Diagnosis present

## 2021-12-15 DIAGNOSIS — E43 Unspecified severe protein-calorie malnutrition: Secondary | ICD-10-CM | POA: Diagnosis present

## 2021-12-15 DIAGNOSIS — R0603 Acute respiratory distress: Secondary | ICD-10-CM

## 2021-12-15 DIAGNOSIS — J9601 Acute respiratory failure with hypoxia: Secondary | ICD-10-CM

## 2021-12-15 DIAGNOSIS — N179 Acute kidney failure, unspecified: Secondary | ICD-10-CM | POA: Diagnosis present

## 2021-12-15 DIAGNOSIS — I619 Nontraumatic intracerebral hemorrhage, unspecified: Secondary | ICD-10-CM | POA: Diagnosis not present

## 2021-12-15 DIAGNOSIS — Z9289 Personal history of other medical treatment: Secondary | ICD-10-CM

## 2021-12-15 DIAGNOSIS — L8990 Pressure ulcer of unspecified site, unspecified stage: Secondary | ICD-10-CM | POA: Diagnosis present

## 2021-12-15 DIAGNOSIS — A419 Sepsis, unspecified organism: Secondary | ICD-10-CM | POA: Diagnosis present

## 2021-12-15 DIAGNOSIS — L8943 Pressure ulcer of contiguous site of back, buttock and hip, stage 3: Secondary | ICD-10-CM

## 2021-12-15 DIAGNOSIS — Z66 Do not resuscitate: Secondary | ICD-10-CM | POA: Diagnosis not present

## 2021-12-15 DIAGNOSIS — Z452 Encounter for adjustment and management of vascular access device: Secondary | ICD-10-CM

## 2021-12-15 DIAGNOSIS — J690 Pneumonitis due to inhalation of food and vomit: Secondary | ICD-10-CM

## 2021-12-15 DIAGNOSIS — J80 Acute respiratory distress syndrome: Secondary | ICD-10-CM | POA: Diagnosis not present

## 2021-12-15 DIAGNOSIS — E875 Hyperkalemia: Secondary | ICD-10-CM | POA: Diagnosis present

## 2021-12-15 DIAGNOSIS — Z978 Presence of other specified devices: Secondary | ICD-10-CM

## 2021-12-15 DIAGNOSIS — I610 Nontraumatic intracerebral hemorrhage in hemisphere, subcortical: Secondary | ICD-10-CM | POA: Diagnosis present

## 2021-12-15 DIAGNOSIS — R739 Hyperglycemia, unspecified: Secondary | ICD-10-CM | POA: Diagnosis present

## 2021-12-15 DIAGNOSIS — I129 Hypertensive chronic kidney disease with stage 1 through stage 4 chronic kidney disease, or unspecified chronic kidney disease: Secondary | ICD-10-CM | POA: Diagnosis present

## 2021-12-15 DIAGNOSIS — M6282 Rhabdomyolysis: Secondary | ICD-10-CM | POA: Diagnosis present

## 2021-12-15 DIAGNOSIS — G9349 Other encephalopathy: Secondary | ICD-10-CM | POA: Diagnosis present

## 2021-12-15 DIAGNOSIS — W1830XA Fall on same level, unspecified, initial encounter: Secondary | ICD-10-CM | POA: Diagnosis present

## 2021-12-15 DIAGNOSIS — Z681 Body mass index (BMI) 19 or less, adult: Secondary | ICD-10-CM

## 2021-12-15 DIAGNOSIS — F209 Schizophrenia, unspecified: Secondary | ICD-10-CM | POA: Diagnosis present

## 2021-12-15 DIAGNOSIS — R34 Anuria and oliguria: Secondary | ICD-10-CM | POA: Diagnosis present

## 2021-12-15 DIAGNOSIS — I6389 Other cerebral infarction: Secondary | ICD-10-CM | POA: Diagnosis not present

## 2021-12-15 DIAGNOSIS — S0081XA Abrasion of other part of head, initial encounter: Secondary | ICD-10-CM | POA: Diagnosis present

## 2021-12-15 DIAGNOSIS — R6521 Severe sepsis with septic shock: Secondary | ICD-10-CM | POA: Diagnosis present

## 2021-12-15 DIAGNOSIS — F17210 Nicotine dependence, cigarettes, uncomplicated: Secondary | ICD-10-CM | POA: Diagnosis present

## 2021-12-15 DIAGNOSIS — Z8616 Personal history of COVID-19: Secondary | ICD-10-CM | POA: Diagnosis not present

## 2021-12-15 DIAGNOSIS — G935 Compression of brain: Secondary | ICD-10-CM | POA: Diagnosis present

## 2021-12-15 DIAGNOSIS — N183 Chronic kidney disease, stage 3 unspecified: Secondary | ICD-10-CM | POA: Diagnosis present

## 2021-12-15 DIAGNOSIS — E872 Acidosis, unspecified: Secondary | ICD-10-CM | POA: Diagnosis present

## 2021-12-15 DIAGNOSIS — Z20822 Contact with and (suspected) exposure to covid-19: Secondary | ICD-10-CM | POA: Diagnosis present

## 2021-12-15 DIAGNOSIS — D696 Thrombocytopenia, unspecified: Secondary | ICD-10-CM | POA: Diagnosis not present

## 2021-12-15 DIAGNOSIS — I616 Nontraumatic intracerebral hemorrhage, multiple localized: Secondary | ICD-10-CM | POA: Diagnosis not present

## 2021-12-15 DIAGNOSIS — N3000 Acute cystitis without hematuria: Secondary | ICD-10-CM | POA: Diagnosis not present

## 2021-12-15 DIAGNOSIS — N390 Urinary tract infection, site not specified: Secondary | ICD-10-CM | POA: Diagnosis present

## 2021-12-15 DIAGNOSIS — I959 Hypotension, unspecified: Secondary | ICD-10-CM | POA: Diagnosis present

## 2021-12-15 DIAGNOSIS — R29723 NIHSS score 23: Secondary | ICD-10-CM | POA: Diagnosis present

## 2021-12-15 DIAGNOSIS — I517 Cardiomegaly: Secondary | ICD-10-CM | POA: Diagnosis not present

## 2021-12-15 DIAGNOSIS — Z8673 Personal history of transient ischemic attack (TIA), and cerebral infarction without residual deficits: Secondary | ICD-10-CM

## 2021-12-15 DIAGNOSIS — G936 Cerebral edema: Secondary | ICD-10-CM | POA: Diagnosis present

## 2021-12-15 DIAGNOSIS — K7200 Acute and subacute hepatic failure without coma: Secondary | ICD-10-CM | POA: Diagnosis not present

## 2021-12-15 DIAGNOSIS — I5030 Unspecified diastolic (congestive) heart failure: Secondary | ICD-10-CM | POA: Diagnosis not present

## 2021-12-15 DIAGNOSIS — B9620 Unspecified Escherichia coli [E. coli] as the cause of diseases classified elsewhere: Secondary | ICD-10-CM | POA: Diagnosis present

## 2021-12-15 DIAGNOSIS — E785 Hyperlipidemia, unspecified: Secondary | ICD-10-CM | POA: Diagnosis present

## 2021-12-15 DIAGNOSIS — G9341 Metabolic encephalopathy: Secondary | ICD-10-CM | POA: Diagnosis present

## 2021-12-15 DIAGNOSIS — I63541 Cerebral infarction due to unspecified occlusion or stenosis of right cerebellar artery: Secondary | ICD-10-CM | POA: Diagnosis present

## 2021-12-15 DIAGNOSIS — I4891 Unspecified atrial fibrillation: Secondary | ICD-10-CM | POA: Diagnosis not present

## 2021-12-15 DIAGNOSIS — H5702 Anisocoria: Secondary | ICD-10-CM | POA: Diagnosis present

## 2021-12-15 LAB — URINALYSIS, ROUTINE W REFLEX MICROSCOPIC
Bilirubin Urine: NEGATIVE
Glucose, UA: NEGATIVE mg/dL
Ketones, ur: NEGATIVE mg/dL
Nitrite: NEGATIVE
Protein, ur: 30 mg/dL — AB
Specific Gravity, Urine: 1.025 (ref 1.005–1.030)
pH: 5.5 (ref 5.0–8.0)

## 2021-12-15 LAB — GLUCOSE, CAPILLARY: Glucose-Capillary: 127 mg/dL — ABNORMAL HIGH (ref 70–99)

## 2021-12-15 LAB — PROTIME-INR
INR: 1.4 — ABNORMAL HIGH (ref 0.8–1.2)
Prothrombin Time: 17.3 seconds — ABNORMAL HIGH (ref 11.4–15.2)

## 2021-12-15 LAB — MRSA NEXT GEN BY PCR, NASAL: MRSA by PCR Next Gen: DETECTED — AB

## 2021-12-15 LAB — CBC
HCT: 50.3 % (ref 39.0–52.0)
Hemoglobin: 16.5 g/dL (ref 13.0–17.0)
MCH: 27.4 pg (ref 26.0–34.0)
MCHC: 32.8 g/dL (ref 30.0–36.0)
MCV: 83.4 fL (ref 80.0–100.0)
Platelets: 238 10*3/uL (ref 150–400)
RBC: 6.03 MIL/uL — ABNORMAL HIGH (ref 4.22–5.81)
RDW: 15.1 % (ref 11.5–15.5)
WBC: 12.7 10*3/uL — ABNORMAL HIGH (ref 4.0–10.5)
nRBC: 2.8 % — ABNORMAL HIGH (ref 0.0–0.2)

## 2021-12-15 LAB — I-STAT CHEM 8, ED
BUN: >130 mg/dL — ABNORMAL HIGH (ref 8–23)
Calcium, Ion: 1.08 mmol/L — ABNORMAL LOW (ref 1.15–1.40)
Chloride: 115 mmol/L — ABNORMAL HIGH (ref 98–111)
Creatinine, Ser: 7.2 mg/dL — ABNORMAL HIGH (ref 0.61–1.24)
Glucose, Bld: 158 mg/dL — ABNORMAL HIGH (ref 70–99)
HCT: 51 % (ref 39.0–52.0)
Hemoglobin: 17.3 g/dL — ABNORMAL HIGH (ref 13.0–17.0)
Potassium: 4.7 mmol/L (ref 3.5–5.1)
Sodium: 146 mmol/L — ABNORMAL HIGH (ref 135–145)
TCO2: 18 mmol/L — ABNORMAL LOW (ref 22–32)

## 2021-12-15 LAB — COMPREHENSIVE METABOLIC PANEL
ALT: 22 U/L (ref 0–44)
AST: 46 U/L — ABNORMAL HIGH (ref 15–41)
Albumin: 2.3 g/dL — ABNORMAL LOW (ref 3.5–5.0)
Alkaline Phosphatase: 53 U/L (ref 38–126)
Anion gap: 22 — ABNORMAL HIGH (ref 5–15)
BUN: 224 mg/dL — ABNORMAL HIGH (ref 8–23)
CO2: 15 mmol/L — ABNORMAL LOW (ref 22–32)
Calcium: 9 mg/dL (ref 8.9–10.3)
Chloride: 111 mmol/L (ref 98–111)
Creatinine, Ser: 7.04 mg/dL — ABNORMAL HIGH (ref 0.61–1.24)
GFR, Estimated: 8 mL/min — ABNORMAL LOW (ref >60)
Glucose, Bld: 162 mg/dL — ABNORMAL HIGH (ref 70–99)
Potassium: 4.7 mmol/L (ref 3.5–5.1)
Sodium: 148 mmol/L — ABNORMAL HIGH (ref 135–145)
Total Bilirubin: 1.5 mg/dL — ABNORMAL HIGH (ref 0.3–1.2)
Total Protein: 6.9 g/dL (ref 6.5–8.1)

## 2021-12-15 LAB — SAMPLE TO BLOOD BANK

## 2021-12-15 LAB — CK TOTAL AND CKMB (NOT AT ARMC)
CK, MB: 21.1 ng/mL — ABNORMAL HIGH (ref 0.5–5.0)
Relative Index: 2 (ref 0.0–2.5)
Total CK: 1079 U/L — ABNORMAL HIGH (ref 49–397)

## 2021-12-15 LAB — RESP PANEL BY RT-PCR (FLU A&B, COVID) ARPGX2
Influenza A by PCR: NEGATIVE
Influenza B by PCR: NEGATIVE
SARS Coronavirus 2 by RT PCR: NEGATIVE

## 2021-12-15 LAB — ETHANOL: Alcohol, Ethyl (B): <10 mg/dL (ref <10)

## 2021-12-15 LAB — URINALYSIS, MICROSCOPIC (REFLEX)
RBC / HPF: >50 RBC/hpf (ref 0–5)
WBC, UA: >50 WBC/hpf (ref 0–5)

## 2021-12-15 LAB — SODIUM: Sodium: 147 mmol/L — ABNORMAL HIGH (ref 135–145)

## 2021-12-15 LAB — LACTIC ACID, PLASMA
Lactic Acid, Venous: 6.6 mmol/L (ref 0.5–1.9)
Lactic Acid, Venous: 3.5 mmol/L (ref 0.5–1.9)

## 2021-12-15 LAB — HIV ANTIBODY (ROUTINE TESTING W REFLEX): HIV Screen 4th Generation wRfx: NONREACTIVE

## 2021-12-15 MED ORDER — SENNOSIDES-DOCUSATE SODIUM 8.6-50 MG PO TABS
1.0000 | ORAL_TABLET | Freq: Two times a day (BID) | ORAL | Status: DC
  Administered 2021-12-16 – 2021-12-17 (×4): 1
  Filled 2021-12-15 (×4): qty 1

## 2021-12-15 MED ORDER — ETOMIDATE 2 MG/ML IV SOLN
INTRAVENOUS | Status: AC | PRN
  Administered 2021-12-15: 20 mg via INTRAVENOUS

## 2021-12-15 MED ORDER — SODIUM CHLORIDE 0.9 % IV BOLUS
2000.0000 mL | Freq: Once | INTRAVENOUS | Status: AC
  Administered 2021-12-15: 2000 mL via INTRAVENOUS

## 2021-12-15 MED ORDER — DEXAMETHASONE SODIUM PHOSPHATE 10 MG/ML IJ SOLN
10.0000 mg | Freq: Once | INTRAMUSCULAR | Status: AC
Start: 2021-12-15 — End: 2021-12-15
  Administered 2021-12-15: 10 mg via INTRAVENOUS
  Filled 2021-12-15: qty 1

## 2021-12-15 MED ORDER — PROPOFOL 1000 MG/100ML IV EMUL
INTRAVENOUS | Status: AC
  Filled 2021-12-15: qty 100

## 2021-12-15 MED ORDER — SODIUM BICARBONATE 8.4 % IV SOLN
INTRAVENOUS | Status: DC
  Filled 2021-12-15: qty 1000

## 2021-12-15 MED ORDER — PANTOPRAZOLE SODIUM 40 MG IV SOLR
40.0000 mg | Freq: Every day | INTRAVENOUS | Status: DC
  Administered 2021-12-15 – 2021-12-16 (×2): 40 mg via INTRAVENOUS
  Filled 2021-12-15 (×2): qty 40

## 2021-12-15 MED ORDER — ACETAMINOPHEN 160 MG/5ML PO SOLN
650.0000 mg | ORAL | Status: DC | PRN
  Administered 2021-12-16 – 2021-12-17 (×2): 650 mg
  Filled 2021-12-15 (×3): qty 20.3

## 2021-12-15 MED ORDER — SODIUM CHLORIDE 0.9 % IV BOLUS
1000.0000 mL | Freq: Once | INTRAVENOUS | Status: AC
  Administered 2021-12-16: 1000 mL via INTRAVENOUS

## 2021-12-15 MED ORDER — SODIUM CHLORIDE 0.9 % IV SOLN: 250.0000 mL | INTRAVENOUS | Status: DC

## 2021-12-15 MED ORDER — INSULIN ASPART 100 UNIT/ML IJ SOLN
0.0000 [IU] | INTRAMUSCULAR | Status: DC
  Administered 2021-12-15 – 2021-12-16 (×3): 1 [IU] via SUBCUTANEOUS
  Administered 2021-12-16: 2 [IU] via SUBCUTANEOUS
  Administered 2021-12-16 (×2): 1 [IU] via SUBCUTANEOUS
  Administered 2021-12-17 (×2): 2 [IU] via SUBCUTANEOUS
  Administered 2021-12-17 (×3): 1 [IU] via SUBCUTANEOUS
  Administered 2021-12-17: 2 [IU] via SUBCUTANEOUS
  Administered 2021-12-18 – 2021-12-19 (×6): 1 [IU] via SUBCUTANEOUS
  Administered 2021-12-19: 2 [IU] via SUBCUTANEOUS

## 2021-12-15 MED ORDER — SODIUM BICARBONATE 8.4 % IV SOLN
50.0000 meq | Freq: Once | INTRAVENOUS | Status: AC
  Administered 2021-12-15: 50 meq via INTRAVENOUS
  Filled 2021-12-15: qty 50

## 2021-12-15 MED ORDER — VANCOMYCIN HCL IN DEXTROSE 1-5 GM/200ML-% IV SOLN
1000.0000 mg | Freq: Once | INTRAVENOUS | Status: AC
  Administered 2021-12-15: 1000 mg via INTRAVENOUS
  Filled 2021-12-15: qty 200

## 2021-12-15 MED ORDER — ROCURONIUM BROMIDE 50 MG/5ML IV SOLN
INTRAVENOUS | Status: AC | PRN
Start: 2021-12-15 — End: 2021-12-15
  Administered 2021-12-15: 100 mg via INTRAVENOUS

## 2021-12-15 MED ORDER — SODIUM CHLORIDE 0.9 % IV BOLUS
INTRAVENOUS | Status: AC | PRN
  Administered 2021-12-15: 999 mL/h via INTRAVENOUS

## 2021-12-15 MED ORDER — ACETAMINOPHEN 325 MG PO TABS
650.0000 mg | ORAL_TABLET | ORAL | Status: DC | PRN
  Administered 2021-12-16: 650 mg
  Filled 2021-12-15: qty 2

## 2021-12-15 MED ORDER — NOREPINEPHRINE 4 MG/250ML-% IV SOLN
2.0000 ug/min | INTRAVENOUS | Status: DC
  Administered 2021-12-15: 20 ug/min via INTRAVENOUS
  Administered 2021-12-17: 2 ug/min via INTRAVENOUS
  Filled 2021-12-15: qty 250

## 2021-12-15 MED ORDER — LACTATED RINGERS IV BOLUS
INTRAVENOUS | Status: AC | PRN
  Administered 2021-12-15: 999 mL/h via INTRAVENOUS

## 2021-12-15 MED ORDER — CLEVIDIPINE BUTYRATE 0.5 MG/ML IV EMUL: 0.0000 mg/h | INTRAVENOUS | Status: DC

## 2021-12-15 MED ORDER — FENTANYL CITRATE PF 50 MCG/ML IJ SOSY
12.5000 ug | PREFILLED_SYRINGE | INTRAMUSCULAR | Status: DC | PRN
  Administered 2021-12-15: 50 ug via INTRAVENOUS
  Administered 2021-12-16: 25 ug via INTRAVENOUS
  Administered 2021-12-16 – 2021-12-17 (×6): 50 ug via INTRAVENOUS
  Filled 2021-12-15 (×6): qty 1

## 2021-12-15 MED ORDER — PROPOFOL 1000 MG/100ML IV EMUL: 5.0000 ug/kg/min | INTRAVENOUS | Status: DC

## 2021-12-15 MED ORDER — STROKE: EARLY STAGES OF RECOVERY BOOK: Freq: Once | Status: DC

## 2021-12-15 MED ORDER — SODIUM CHLORIDE 0.9 % IV SOLN: INTRAVENOUS | Status: DC

## 2021-12-15 MED ORDER — SODIUM CHLORIDE 3 % IV SOLN
INTRAVENOUS | Status: DC
  Filled 2021-12-15 (×3): qty 500

## 2021-12-15 MED ORDER — SODIUM CHLORIDE 0.9 % IV SOLN
1.0000 g | INTRAVENOUS | Status: DC
  Administered 2021-12-15: 1 g via INTRAVENOUS
  Filled 2021-12-15: qty 1

## 2021-12-15 MED ORDER — VANCOMYCIN VARIABLE DOSE PER UNSTABLE RENAL FUNCTION (PHARMACIST DOSING): Status: DC

## 2021-12-15 MED ORDER — ACETAMINOPHEN 650 MG RE SUPP: 650.0000 mg | RECTAL | Status: DC | PRN

## 2021-12-15 MED ORDER — SODIUM CHLORIDE 0.9 % IV SOLN
1.5000 g | INTRAVENOUS | Status: DC
  Administered 2021-12-15: 1.5 g via INTRAVENOUS
  Filled 2021-12-15 (×2): qty 4

## 2021-12-15 NOTE — Progress Notes (Signed)
Trauma Event Note  TRN rounded on patient. Patient intubated/sedated, not following any commands at this time. Spoke with primary RN, no needs from trauma at this time.   Last imported Vital Signs BP (!) 126/99    Pulse (!) 112    Temp (!) 97.5 F (36.4 C) (Axillary)    Resp (!) 41    Ht 5\' 10"  (1.778 m)    Wt 125 lb (56.7 kg)    SpO2 96%    BMI 17.94 kg/m   Trending CBC Recent Labs    12/14/2021 1616 11/15/2021 1627  WBC 12.7*  --   HGB 16.5 17.3*  HCT 50.3 51.0  PLT 238  --     Trending Coag's Recent Labs    12/10/2021 1616  INR 1.4*    Trending BMET Recent Labs    11/29/2021 1616 12/04/2021 1627 12/01/2021 2152  NA 148* 146* 147*  K 4.7 4.7  --   CL 111 115*  --   CO2 15*  --   --   BUN 224* >130*  --   CREATININE 7.04* 7.20*  --   GLUCOSE 162* 158*  --       Darryl Parsons Darryl Parsons  Trauma Response RN  Please call TRN at [phone] for further assistance.

## 2021-12-15 NOTE — H&P (Addendum)
NAME:  Darryl Parsons, MRN:  [id number], DOB:  10-04-1956, LOS: 0 ADMISSION DATE:  12/14/2021, CONSULTATION DATE:  11/27/2021 REFERRING MD:  Darl Householder, CHIEF COMPLAINT:  unresponsive   History of Present Illness:  66 year old male with prior history of schizophrenia, cocaine abuse, CVA, and CKD who presented to ER as level 1 trauma.  Patient found unresponsive and agonal in a hotel room between the wall and bed with significant swelling to left eye and forehead and pressure wound on left exterior thigh with pinpoint pupils and right gaze.  Last seen 2 days ago.  Given narcan with no change.  On arrival to ER, he was hypotensive 84/50, HR 130, and was intubated on arrival to ER and underwent trauma evaluation.  Found on workup to have acute right basal ganglia hemorrhage with mild edema and left MLS 25mm with smaller acute left basal ganglia hemorrhages, and acute vs subacute left thalamic infarcts, AKI with concern for rhabdo, lactic acidosis, and suspected aspiration pneumonia. He is requiring levophed for ongoing hypotension.  Neuro consulted and PCCM called for admit.   Pertinent  Medical History   has no past medical history on file.   Duplicate chart MRN: [id number]  Visual hallucinations Bipolar/Depression, ?major Cocaine abuse HTn CKD1 Prior GIB in the past 2/2 to internal hemorrhoids Recent admit to [facility] 10.12.--10.17 Has a repeated pattern of OD and ingestion and reported self-harm  Significant Hospital Events: Including procedures, antibiotic start and stop dates in addition to other pertinent events   1/31 Brought in by EMS, intubated, found to have [place]  Significant Studies:  1/31 CTH> 1. 4 cm acute right basal ganglia hemorrhage with mild edema and 4 mm of leftward midline shift. 2. Smaller acute hemorrhages in the left basal ganglia, right thalamus, and left temporal lobe. 3. Suspected acute/subacute left thalamic infarct. 4. Chronic cerebellar infarcts. 5. Left-sided scalp  and facial soft tissue swelling. 6. No acute maxillofacial or cervical spine fracture. 7. Remote left orbital fracture.   Interim History / Subjective:  Pt intubated in the ED, unresponsive after intubation   Objective   Blood pressure (!) 156/28, pulse (!) 116, resp. rate 20, height 5\' 10"  (1.778 m), weight 56.7 kg, SpO2 99 %.    Vent Mode: PRVC FiO2 (%):  [100 %] 100 % Set Rate:  [20 bmp] 20 bmp Vt Set:  [550 mL] 550 mL PEEP:  [8 cmH20] 8 cmH20 Plateau Pressure:  [14 cmH20] 14 cmH20   Intake/Output Summary (Last 24 hours) at 12/12/2021 1751 Last data filed at 11/28/2021 1737 Gross per 24 hour  Intake 0 ml  Output 0 ml  Net 0 ml   Filed Weights   11/25/2021 1705  Weight: 56.7 kg    General:  critically ill-appearing M, intubated and sedated HEENT: MM pink/moist Neuro: examined after RSI induction, unresponsive without gag or corneal reflexes, pupils equal but unresponsive to light CV: s1s2 rrr, no m/r/g PULM:  mechanical breath sounds bilaterally  GI: soft, bsx4 active  Extremities: warm/dry, 2+ LUE  edema, extremity compartments are soft Skin: erythema, abrasions to the abdomen and  ulceration through the dermis  to the proximal L upper thigh as noted below,        Resolved Hospital Problem list     Assessment & Plan:    Acute R basal ganglia and L basal ganglia ICH Acute/subacute L thalamic infarct Acute Hypoxic Respiratory Failure Agonal breathing on EMS arrival and intubated in the ED Trauma evaluated  -admit to  intensive care -BP control with SBP goal <140 -HOB elevated 30 degrees  -serial neuro checks as RSI medication clears -ICH score 2, 26% mortality -monitor sodium --Maintain full vent support with SAT/SBT as tolerated -titrate Vent setting to maintain SpO2 greater than or equal to 90%. -HOB elevated 30 degrees. -Plateau pressures less than 30 cm H20.  -Follow chest x-ray, ABG prn.   -Bronchial hygiene and RT/bronchodilator  protocol.     Acute Kidney Injury Creatinine 7.2, baseline 1.6-1.7 Likely pre-renal volume depletion -IVF and follow BMP and electrolytes -monitor UOP, avoid nephrotoxins as able    Multiple Pressure wounds Likely mild Rhabdomyolysis  CK 1000 -IVF resuscitation  -wound care  -follow renal function    Anion Gap Metabolic Acidosis pH 7.0, pCO2 18 -suspect secondary to lactic acidosis    Likely aspiration Pneumonia Received Vanc/Zosyn in the ED -check blood cultures and start Unasyn   Best Practice (right click and "Reselect all SmartList Selections" daily)   Diet/type: NPO DVT prophylaxis: SCD GI prophylaxis: PPI Lines: N/A Foley:  Yes, and it is still needed Code Status:  full code Last date of multidisciplinary goals of care discussion [no family available for contact]  Labs   CBC: Recent Labs  Lab 12/09/2021 1616 11/21/2021 1627  WBC 12.7*  --   HGB 16.5 17.3*  HCT 50.3 51.0  MCV 83.4  --   PLT 238  --     Basic Metabolic Panel: Recent Labs  Lab 11/22/2021 1616 12/07/2021 1627  NA 148* 146*  K 4.7 4.7  CL 111 115*  CO2 15*  --   GLUCOSE 162* 158*  BUN 224* >130*  CREATININE 7.04* 7.20*  CALCIUM 9.0  --    GFR: Estimated Creatinine Clearance: 8.3 mL/min (A) (by C-G formula based on SCr of 7.2 mg/dL (H)). Recent Labs  Lab 12/08/2021 1616  WBC 12.7*  LATICACIDVEN 6.6*    Liver Function Tests: Recent Labs  Lab 12/02/2021 1616  AST 46*  ALT 22  ALKPHOS 53  BILITOT 1.5*  PROT 6.9  ALBUMIN 2.3*   No results for input(s): LIPASE, AMYLASE in the last 168 hours. No results for input(s): AMMONIA in the last 168 hours.  ABG    Component Value Date/Time   TCO2 18 (L) 12/14/2021 1627     Coagulation Profile: Recent Labs  Lab 11/24/2021 1616  INR 1.4*    Cardiac Enzymes: Recent Labs  Lab 11/26/2021 1616  CKTOTAL 1,079*  CKMB 21.1*    HbA1C: No results found for: HGBA1C  CBG: No results for input(s): GLUCAP in the last 168  hours.  Review of Systems:   Unable to obtain secondary to mental status  Past Medical History:  He,  has no past medical history on file.   Surgical History:  Unable to obtain  Social History:  Unable to obtain    Family History:  His family history is not on file.   Allergies No Known Allergies   Home Medications  Prior to Admission medications   Not on File     Critical care time:   45 minutes    CRITICAL CARE Performed by: Otilio Carpen Yehya Brendle   Total critical care time: 45 minutes  Critical care time was exclusive of separately billable procedures and treating other patients.  Critical care was necessary to treat or prevent imminent or life-threatening deterioration.  Critical care was time spent personally by me on the following activities: development of treatment plan with patient and/or surrogate as well as  nursing, discussions with consultants, evaluation of patient's response to treatment, examination of patient, obtaining history from patient or surrogate, ordering and performing treatments and interventions, ordering and review of laboratory studies, ordering and review of radiographic studies, pulse oximetry and re-evaluation of patient's condition.  Otilio Carpen [place], PA-C [facility] See Amion for pager If no response to pager , please call [phone] until 7pm After 7:00 pm call Elink  [phone]

## 2021-12-15 NOTE — Progress Notes (Signed)
RT NOTE: RT transported patient on ventilator from ED to room 4N21. Vitals are stable. RT will continue to monitor.

## 2021-12-15 NOTE — Progress Notes (Signed)
Belongings include: shoes and ID.

## 2021-12-15 NOTE — Progress Notes (Signed)
Orthopedic Tech Progress Note Patient Details:  Darryl Parsons 07/28/1956 [id number]  Level 1 trauma   Patient ID: [name], male   DOB: Apr 07, 1956, 66 y.o.   MRN: [id number]  [name] 12/12/2021, 4:31 PM

## 2021-12-15 NOTE — Progress Notes (Addendum)
[facility] Progress Note Patient Name: Darryl Parsons DOB: 08-20-1956 MRN: [id number]   Date of Service  12/14/2021  HPI/Events of Note  Oliguria - Urine output 30 mL since arrival in ICU. No CVL or CVP. CXR does not appear to exhibit volume overload. Felt to be volume depleted on admission H&P.  eICU Interventions  Plan: Bolus with 0.9 NaCl 1 liter IV over 1 hour now.      Intervention Category Major Interventions: Other:  Lysle Dingwall 11/29/2021, 11:59 PM

## 2021-12-15 NOTE — Progress Notes (Signed)
°   12/14/2021 1605  Clinical Encounter Type  Visited With Patient not available  Visit Type Initial;Trauma  Referral From Nurse  Consult/Referral To None   Chaplain responded to trauma one alert. Patient was already receiving care when I arrived in ED.  There was no family present and I advised the nurse that if a chaplain was requested to please call and we would return.   [place] Resident  [facility]  [phone]

## 2021-12-15 NOTE — TOC CAGE-AID Note (Signed)
Transition of Care [facility]) - CAGE-AID Screening   Patient Details  Name: Darryl Parsons MRN: [id number] Date of Birth: [birth year]  [facility] CM/SW Contact:    Army Melia, RN Phone Number: 12/06/2021, 11:19 PM   Clinical Narrative:  Patient presents to hospital after being found down in his hotel room, LSW Sunday. Bruising to left face and body, ICH on head CT with possible subacute stroke. Hx cocaine use noted in chart. Currently intubated and sedated, not following commands. Unable to screen at this time.  CAGE-AID Screening: Substance Abuse Screening unable to be completed due to: : Patient unable to participate (patient intubated and not responding to commands, unable to complete screening at this time)

## 2021-12-15 NOTE — Progress Notes (Addendum)
Per nurse patient not available for EEG LTM set up going to MRI.

## 2021-12-15 NOTE — Consult Note (Addendum)
NEUROLOGY CONSULTATION NOTE   Date of service: December 15, 2021 Patient Name: Darryl Parsons MRN:  [id number] DOB:  May 10, 1956 Reason for consult: "ICH" Requesting Provider: Jacky Kindle, MD _ _ _   _ __   _ __ _ _  __ __   _ __   __ _  History of Present Illness  Darryl Parsons is a 66 y.o. male with PMH significant for schizophrenia, prior left PICA stroke, cocaine use, CKD, hypertension, smoker who was found down in his hotel room wedged between the bed and a wall it was brought in as a level 1 trauma.  He was intubated in the ED for GCS of 3.  He was also evaluated by trauma and was noted to have a pressure wound along the left side with a large pressure wound on his left back and a full-thickness pressure wound on his left hip, significant edema in left upper extremity and bruising on left face.  He was also noted to be hypotensive and started on Levo.  Work-up demonstrated a 4 cm acute right basal ganglia hemorrhage with mild edema and left 14mm midline shift, along with small acute hemorrhage in the left basal ganglia, right thalamus and left temporal lobe. CT also concerning for possible subacute left thalamic subacute stroke.  Labs notable for AKI with creatinine of 7.04, BUN of 224, CK elevated to 1079.  Neurology was consulted for further evaluation and management of the noted BL ICHs.  Unable to obtain history from patient due to intubation. No listed contacts in the chart to obtain colateral from. Most of the history is obtained from chart review.   LKW: 12/12/21 but unclear time. mRS: unknown tNKASE/Thrombectomy: not a candidate 2/2 ICH ICH score: 2 NIHSS components Score: Comment  1a Level of Conscious 0[]  1[]  2[]  3[x]      1b LOC Questions 0[]  1[]  2[x]       1c LOC Commands 0[]  1[]  2[x]       2 Best Gaze 0[x]  1[]  2[]       3 Visual 0[x]  1[]  2[]  3[]      4 Facial Palsy 0[x]  1[]  2[]  3[]      5a Motor Arm - left 0[]  1[]  2[]  3[x]  4[]  UN[]    5b Motor Arm - Right 0[]  1[]  2[]  3[]   4[x]  UN[]    6a Motor Leg - Left 0[]  1[]  2[]  3[x]  4[]  UN[]    6b Motor Leg - Right 0[]  1[]  2[]  3[x]  4[]  UN[]    7 Limb Ataxia 0[]  1[]  2[]  3[]  UN[x]     8 Sensory 0[]  1[]  2[]  UN[x]      9 Best Language 0[]  1[]  2[]  3[x]      10 Dysarthria 0[]  1[]  2[]  UN[x]      11 Extinct. and Inattention 0[x]  1[]  2[]       TOTAL: 23       ROS   Unable to obtain review of system due to intubation and sedation.  Past History  No past medical history on file.  No family history on file. Social History   Socioeconomic History   Marital status: Single    Spouse name: Not on file   Number of children: Not on file   Years of education: Not on file   Highest education level: Not on file  Occupational History   Not on file  Tobacco Use   Smoking status: Not on file   Smokeless tobacco: Not on file  Substance and Sexual Activity   Alcohol use: Not on file   Drug use:  Not on file   Sexual activity: Not on file  Other Topics Concern   Not on file  Social History Narrative   Not on file   Social Determinants of Health   Financial Resource Strain: Not on file  Food Insecurity: Not on file  Transportation Needs: Not on file  Physical Activity: Not on file  Stress: Not on file  Social Connections: Not on file   No Known Allergies  Medications   No medications prior to admission.     Vitals   Vitals:   11/23/2021 1900 12/05/2021 2000 12/01/2021 2027 12/11/2021 2100  BP: (!) 155/111 104/66 104/66 112/90  Pulse: (!) 111 99 (!) 111 (!) 106  Resp: 18 (!) 23 20 (!) 29  Temp: (!) 97.5 F (36.4 C)     TempSrc: Axillary     SpO2: 93% 93% 95% 95%  Weight:      Height:         Body mass index is 17.94 kg/m.  Physical Exam   General: Laying comfortably in bed; in no acute distress. HENT: Normal oropharynx and mucosa. Normal external appearance of ears and nose. Neck: Supple, no pain or tenderness CV: No JVD. No peripheral edema. Pulmonary: Symmetric Chest rise. Breathing over vent. Abdomen: Soft  to touch, non-tender. Ext: No cyanosis, LUE is edematous, Wound on L later hip that is bandaged with no drainage. Skin: No rash. Normal palpation of skin.  Musculoskeletal: Normal digits and nails by inspection. No clubbing.  Neurologic Examination performed off any sedation  Mental status/Cognition: No response to voice or loud clap.   Speech/language: mute, intubated, no attempts to communicate. Cranial nerves:   CN II Pupils equal and reactive to light, unable to assess for VF deficits.   CN III,IV,VI EOM intact to dolls eyes.   CN V Corneals weakly positive BL   CN VII no asymmetry, no nasolabial fold flattening   CN VIII Does not turn head towards speech   CN IX & X Cough intact. Unable to elicit a gag.   CN XI    CN XII midline tongue, does not protrude to command.   Motor/sensory:  Muscle bulk: normal, tone flaccid throughout. Unable to do detailed strength testing secondary to intubation and sedation. No response to pinch in any extremities. No spontaneous movement noted in any extremities.  Reflexes:  Right Left Comments  Pectoralis      Biceps (C5/6) 2 2   Brachioradialis (C5/6) 2 2    Triceps (C6/7) 2 2    Patellar (L3/4) 2 2    Achilles (S1)      Hoffman      Plantar equivocal equivocal   Jaw jerk    Coordination/Complex Motor:  - Unable to assess.  Labs   CBC:  Recent Labs  Lab 12/10/2021 1616 12/10/2021 1627  WBC 12.7*  --   HGB 16.5 17.3*  HCT 50.3 51.0  MCV 83.4  --   PLT 238  --     Basic Metabolic Panel:  Lab Results  Component Value Date   NA 146 (H) 11/19/2021   K 4.7 12/13/2021   CO2 15 (L) 11/22/2021   GLUCOSE 158 (H) 12/07/2021   BUN >130 (H) 12/13/2021   CREATININE 7.20 (H) 12/08/2021   CALCIUM 9.0 12/03/2021   GFRNONAA 8 (L) 12/04/2021   Lipid Panel: No results found for: LDLCALC HgbA1c: No results found for: HGBA1C Urine Drug Screen: No results found for: LABOPIA, [place], LABBENZ, AMPHETMU, THCU, LABBARB  Alcohol  Level      Component Value Date/Time   ETH <10 12/14/2021 1616    CT Head without contrast(Personally reviewed): 1. 4 cm acute right basal ganglia hemorrhage with mild edema and 4 mm of leftward midline shift. 2. Smaller acute hemorrhages in the left basal ganglia, right thalamus, and left temporal lobe. 3. Suspected acute/subacute left thalamic infarct. 4. Chronic cerebellar infarcts. 5. Left-sided scalp and facial soft tissue swelling. 6. No acute maxillofacial or cervical spine fracture. 7. Remote left orbital fracture.  Repeat CT Head without contrast(Personally reviewed): Appears stable.  MRI Brain: pending  cEEG:  pending  Impression   Darryl Parsons is a 66 y.o. male essentially found down, wedged between a bed and wall and lying on his left side. He had not been seen for over 2 days. CTH with 4 cm acute right basal ganglia hemorrhage with mild edema and left 25mm midline shift, along with small acute hemorrhage in the left basal ganglia, right thalamus and left temporal lobe. CT also concerning for possible subacute left thalamic subacute stroke.  Labs notable for AKI with creatinine of 7.04, BUN of 224.  Neuro exam very poor with weak corneals, absent gag and rest of the brainstem reflexes intact. This is out of proportion to what would be expected from the noted hemorrhages.  He will also need further workup for potential etiology of poor neuro exam. Differential includes hypoxic injury, drug overdose, uremic encephalopathy, seizures.  Will workup with MRI Brain without contrast, Urine drug screen, cEEG with MRI safe leads to prevent delay to MRI.  The location and the fact that he has multiple ICH is also very odd. Could be traumatic, could be from potential hypertension from use of stimulants or he could have underlying cerebral amyloid angiopathy or sinus venous thrombosis.  Recommendations   Multiple subcortial ICH with the largest being 4cm Right Basal ganglia ICH wiuth  cerebral edema and midline shift: - Stability scan in 6 hours or STAT with any neurological decline - Frequent neuro checks; q78min for 1 hour, then q1hour - No antiplatelets or anticoagulants due to [place] - SCD for DVT prophylaxis, pharmacological DVT ppx at 24 hours if ICH is stable - Blood pressure control with goal systolic 226 - 333, cleverplex and labetalol PRN - Stroke labs, HgbA1c, fasting lipid panel - MRI brain without contrast to evaluate for underlying mass. No contrast due to AKI. - MRA without contrast of the brain and Vasc US carotid duplex to evaluate for underlying vascular abnormality. - MR venogram head with TOF imaging and no contrast to evaluate for dural sinus venous thrombosis. - Risk factor modification - Echocardiogram - PT consult, OT consult, Speech consult. - Will start hypertonic saline 3%, specifically given the proximity of the Right BG hemorrhage to the medial temporal lobe and uncus which puts him at risk for potential herniation. - cEEG with MRI safe leads to evaluate for seizures or epileptiform discharges. EEG tech notified to use MRI safe leads. - Serum Ammonia, B12 with MMA, folate, RPR, HIV, urine drug screen. - Stroke team to follow   _____________________________________________________________________ This patient is critically ill and at significant risk of neurological worsening, death and care requires constant monitoring of vital signs, hemodynamics,respiratory and cardiac monitoring, neurological assessment, discussion with family, other specialists and medical decision making of high complexity. I spent 70 minutes of neurocritical care time  in the care of  this patient. This was time spent independent of any time provided by nurse practitioner or  PA.  Donnetta Simpers [facility] Pager Number [phone] 12/16/2021  2100   Thank you for the opportunity to take part in the care of this patient. If you have any further questions,  please contact the neurology consultation attending.  Signed,  [place] Pager Number [phone] _ _ _   _ __   _ __ _ _  __ __   _ __   __ _

## 2021-12-15 NOTE — Progress Notes (Signed)
[facility] Progress Note Patient Name: Darryl Parsons DOB: 03-16-1956 MRN: [id number]   Date of Service  11/18/2021  HPI/Events of Note  Ventilator asynchrony and tachypnea.  eICU Interventions  Plan: Fentanyl 12.5-50 mcg IV Q 2 hours PRN sedation or agitation.      Intervention Category Major Interventions: Respiratory failure - evaluation and management  Nelma Phagan [place] 12/07/2021, 11:05 PM

## 2021-12-15 NOTE — ED Notes (Addendum)
Report given to [place], Therapist, sports. (4N)

## 2021-12-15 NOTE — ED Provider Notes (Signed)
[facility] EMERGENCY DEPARTMENT Provider Note   CSN: [id number] Arrival date & time: 11/28/2021  1608     History  Chief Complaint  Patient presents with   Trauma    Level 1 Trauma    DRU PRIMEAU is a 66 y.o. male here presenting with altered mental status.  Patient unable to give history.  History per EMS.  Patient apparently was found altered in his hotel room.  Patient apparently was laying on the left side between the bed and the wall.  Patient was tachypneic but unable to answer any questions.  History per EMS.  Given possible signs of trauma, level 1 trauma was activated.  Trauma surgery at bedside.  Patient was given Narcan with no improvement  The history is provided by the EMS personnel.      Home Medications Prior to Admission medications   Not on File      Allergies    Patient has no known allergies.    Review of Systems   Review of Systems  Psychiatric/Behavioral:  Positive for confusion.   All other systems reviewed and are negative.  Physical Exam Updated Vital Signs BP 136/78 (BP Location: Left Arm)    Pulse (!) 106    Temp (!) 96.6 F (35.9 C) (Core)    Resp 20    Ht 5\' 10"  (1.778 m)    Wt 56.7 kg    SpO2 98%    BMI 17.94 kg/m  Physical Exam Vitals and nursing note reviewed.  Constitutional:      Comments: Breathing spontaneously but not following commands.  Patient appears very disheveled and covered with feces.   HENT:     Head:     Comments: Patient has abrasion on the left face.    Nose: Nose normal.     Mouth/Throat:     Comments: Dry and missing multiple teeth Eyes:     Comments: Pupils 2 mm but reactive to light  Cardiovascular:     Rate and Rhythm: Regular rhythm. Tachycardia present.     Pulses: Normal pulses.  Pulmonary:     Comments: Tachypneic, diminished bilateral bases Abdominal:     General: Abdomen is flat.     Comments: Stage I decub on the left flank area  Musculoskeletal:     Cervical back: Normal  range of motion.     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 Patient also has swollen left forearm and upper arm.  Patient has good radial pulse  Skin:    Capillary Refill: Capillary refill takes less than 2 seconds.  Neurological:     Comments: Patient is now moving the left side but moving the right side spontaneously.  Patient is altered and unable to answer questions.  Patient responds only to painful stimuli  Psychiatric:     Comments: Unable    ED Results / Procedures / Treatments   Labs (all labs ordered are listed, but only abnormal results are displayed) Labs Reviewed  COMPREHENSIVE METABOLIC PANEL - Abnormal; Notable for the following components:      Result Value   Sodium 148 (*)    CO2 15 (*)    Glucose, Bld 162 (*)    BUN 224 (*)    Creatinine, Ser 7.04 (*)    Albumin 2.3 (*)    AST 46 (*)    Total Bilirubin 1.5 (*)    GFR, Estimated 8 (*)    Anion gap 22 (*)  All other components within normal limits  CBC - Abnormal; Notable for the following components:   WBC 12.7 (*)    RBC 6.03 (*)    nRBC 2.8 (*)    All other components within normal limits  LACTIC ACID, PLASMA - Abnormal; Notable for the following components:   Lactic Acid, Venous 6.6 (*)    All other components within normal limits  PROTIME-INR - Abnormal; Notable for the following components:   Prothrombin Time 17.3 (*)    INR 1.4 (*)    All other components within normal limits  CK TOTAL AND CKMB (NOT AT [facility]) - Abnormal; Notable for the following components:   Total CK 1,079 (*)    CK, MB 21.1 (*)    All other components within normal limits  I-STAT CHEM 8, ED - Abnormal; Notable for the following components:   Sodium 146 (*)    Chloride 115 (*)    BUN >130 (*)    Creatinine, Ser 7.20 (*)    Glucose, Bld 158 (*)    Calcium, Ion 1.08 (*)    TCO2 18 (*)    Hemoglobin 17.3 (*)    All other components within normal limits  RESP PANEL BY RT-PCR (FLU A&B, COVID) ARPGX2   CULTURE, BLOOD (ROUTINE X 2)  CULTURE, BLOOD (ROUTINE X 2)  URINE CULTURE  ETHANOL  URINALYSIS, ROUTINE W REFLEX MICROSCOPIC  BLOOD GAS, ARTERIAL  TRIGLYCERIDES  HIV ANTIBODY (ROUTINE TESTING W REFLEX)  LACTIC ACID, PLASMA  SAMPLE TO BLOOD BANK    EKG None  Radiology DG Forearm Left  Result Date: 12/13/2021 CLINICAL DATA:  [place], swelling EXAM: LEFT FOREARM - 2 VIEW COMPARISON:  None. FINDINGS: Frontal and lateral views of the left forearm are obtained. No fracture, subluxation, or dislocation. Joint spaces appear well preserved. There is diffuse soft tissue edema. IMPRESSION: 1. Diffuse soft tissue swelling. 2. No acute fracture. Electronically Signed   By: Randa Ngo M.D.   On: 12/14/2021 17:16   CT HEAD WO CONTRAST (5MM)  Result Date: 11/20/2021 CLINICAL DATA:  Level 1 trauma.  Found unresponsive. EXAM: CT HEAD WITHOUT CONTRAST CT MAXILLOFACIAL WITHOUT CONTRAST CT CERVICAL SPINE WITHOUT CONTRAST TECHNIQUE: Multidetector CT imaging of the head, cervical spine, and maxillofacial structures were performed using the standard protocol without intravenous contrast. Multiplanar CT image reconstructions of the cervical spine and maxillofacial structures were also generated. RADIATION DOSE REDUCTION: This exam was performed according to the departmental dose-optimization program which includes automated exposure control, adjustment of the mA and/or kV according to patient size and/or use of iterative reconstruction technique. COMPARISON:  Head CT 03/17/2017 FINDINGS: CT HEAD FINDINGS Brain: An acute hemorrhage involving the right lentiform nucleus/external capsule measures 4.1 x 2.4 x 3.2 cm (approximate volume 16 mL). There is mild surrounding edema with mass effect on the right lateral ventricle and 4 mm of leftward midline shift. A smaller acute hemorrhage anteriorly in the left basal ganglia/anterior limb of left internal capsule measures 1.5 cm with minimal surrounding edema. A small acute  right thalamic hemorrhage measures 5 mm. There is also a 4 mm acute hemorrhage anteriorly in the left temporal lobe without significant edema. No acute cortically based infarct or extra-axial fluid collection is identified. There is no hydrocephalus. There are chronic left larger than right cerebellar infarcts. Left thalamic lacunar infarcts are new and have a more chronic appearance anteriorly and more acute to subacute appearance posteriorly. Patchy hypodensities in the cerebral white matter bilaterally are nonspecific but compatible with mild-to-moderate  chronic small vessel ischemic disease. Vascular: No hyperdense vessel. Skull: No acute calvarial fracture.  Left frontal scalp swelling. Other: None. CT MAXILLOFACIAL FINDINGS Osseous: No acute fracture or mandibular dislocation. Remote blowout fracture involving the medial wall and floor of the left orbit, also present in 2018. Multiple missing teeth. Periapical lucency involving the left mandibular central incisor with evidence of prior root canal. Orbits: Left periorbital soft tissue swelling. Grossly intact globes. No retrobulbar hematoma. Sinuses: Opacification of a hypoplastic left frontal sinus and of multiple left ethmoid air cells. Mucosal thickening and small volume fluid in the left maxillary sinus. Clear mastoid air cells. Soft tissues: Left facial soft tissue swelling diffusely. CT CERVICAL SPINE FINDINGS Alignment: Cervical spine straightening.  No listhesis. Skull base and vertebrae: No acute fracture or suspicious osseous lesion. Soft tissues and spinal canal: No prevertebral fluid or swelling. No visible canal hematoma. Disc levels: Mild-to-moderate cervical spondylosis. Moderate to severe right neural foraminal stenosis at C3-4 due to asymmetric uncovertebral spurring. Upper chest: Reported separately. Other: Partially visualized endotracheal and enteric tubes. IMPRESSION: 1. 4 cm acute right basal ganglia hemorrhage with mild edema and 4 mm of  leftward midline shift. 2. Smaller acute hemorrhages in the left basal ganglia, right thalamus, and left temporal lobe. 3. Suspected acute/subacute left thalamic infarct. 4. Chronic cerebellar infarcts. 5. Left-sided scalp and facial soft tissue swelling. 6. No acute maxillofacial or cervical spine fracture. 7. Remote left orbital fracture. Critical Value/emergent results were reviewed in person with Dr. Georganna Skeans on 11/22/2021 at 4:50 p.m. Electronically Signed   By: Logan Bores M.D.   On: 12/03/2021 17:15   CT CERVICAL SPINE WO CONTRAST  Result Date: 11/29/2021 CLINICAL DATA:  Level 1 trauma.  Found unresponsive. EXAM: CT HEAD WITHOUT CONTRAST CT MAXILLOFACIAL WITHOUT CONTRAST CT CERVICAL SPINE WITHOUT CONTRAST TECHNIQUE: Multidetector CT imaging of the head, cervical spine, and maxillofacial structures were performed using the standard protocol without intravenous contrast. Multiplanar CT image reconstructions of the cervical spine and maxillofacial structures were also generated. RADIATION DOSE REDUCTION: This exam was performed according to the departmental dose-optimization program which includes automated exposure control, adjustment of the mA and/or kV according to patient size and/or use of iterative reconstruction technique. COMPARISON:  Head CT 03/17/2017 FINDINGS: CT HEAD FINDINGS Brain: An acute hemorrhage involving the right lentiform nucleus/external capsule measures 4.1 x 2.4 x 3.2 cm (approximate volume 16 mL). There is mild surrounding edema with mass effect on the right lateral ventricle and 4 mm of leftward midline shift. A smaller acute hemorrhage anteriorly in the left basal ganglia/anterior limb of left internal capsule measures 1.5 cm with minimal surrounding edema. A small acute right thalamic hemorrhage measures 5 mm. There is also a 4 mm acute hemorrhage anteriorly in the left temporal lobe without significant edema. No acute cortically based infarct or extra-axial fluid  collection is identified. There is no hydrocephalus. There are chronic left larger than right cerebellar infarcts. Left thalamic lacunar infarcts are new and have a more chronic appearance anteriorly and more acute to subacute appearance posteriorly. Patchy hypodensities in the cerebral white matter bilaterally are nonspecific but compatible with mild-to-moderate chronic small vessel ischemic disease. Vascular: No hyperdense vessel. Skull: No acute calvarial fracture.  Left frontal scalp swelling. Other: None. CT MAXILLOFACIAL FINDINGS Osseous: No acute fracture or mandibular dislocation. Remote blowout fracture involving the medial wall and floor of the left orbit, also present in 2018. Multiple missing teeth. Periapical lucency involving the left mandibular central incisor with evidence of prior  root canal. Orbits: Left periorbital soft tissue swelling. Grossly intact globes. No retrobulbar hematoma. Sinuses: Opacification of a hypoplastic left frontal sinus and of multiple left ethmoid air cells. Mucosal thickening and small volume fluid in the left maxillary sinus. Clear mastoid air cells. Soft tissues: Left facial soft tissue swelling diffusely. CT CERVICAL SPINE FINDINGS Alignment: Cervical spine straightening.  No listhesis. Skull base and vertebrae: No acute fracture or suspicious osseous lesion. Soft tissues and spinal canal: No prevertebral fluid or swelling. No visible canal hematoma. Disc levels: Mild-to-moderate cervical spondylosis. Moderate to severe right neural foraminal stenosis at C3-4 due to asymmetric uncovertebral spurring. Upper chest: Reported separately. Other: Partially visualized endotracheal and enteric tubes. IMPRESSION: 1. 4 cm acute right basal ganglia hemorrhage with mild edema and 4 mm of leftward midline shift. 2. Smaller acute hemorrhages in the left basal ganglia, right thalamus, and left temporal lobe. 3. Suspected acute/subacute left thalamic infarct. 4. Chronic cerebellar  infarcts. 5. Left-sided scalp and facial soft tissue swelling. 6. No acute maxillofacial or cervical spine fracture. 7. Remote left orbital fracture. Critical Value/emergent results were reviewed in person with Dr. Georganna Skeans on 11/18/2021 at 4:50 p.m. Electronically Signed   By: Logan Bores M.D.   On: 11/21/2021 17:15   DG Pelvis Portable  Result Date: 12/03/2021 CLINICAL DATA:  Trauma, fall. EXAM: PORTABLE PELVIS 1-2 VIEWS COMPARISON:  None. FINDINGS: Right hip is internally rotated which limits evaluation. There is no evidence of pelvic fracture or diastasis. No pelvic bone lesions are seen. IVC filter is present. IMPRESSION: Negative. Electronically Signed   By: Ronney Asters M.D.   On: 12/04/2021 17:17   DG Chest Port 1 View  Result Date: 12/06/2021 CLINICAL DATA:  Level 1 trauma, overdose EXAM: PORTABLE CHEST 1 VIEW COMPARISON:  05/23/2021 FINDINGS: Endotracheal tube with the tip 4.7 cm above the carina. Nasogastric tube coursing below the diaphragm. Right lung is clear. Patchy airspace disease throughout the left lung which may reflect multilobar pneumonia versus aspiration pneumonia versus pulmonary contusions. No pneumothorax. Stable cardiomediastinal silhouette. No acute osseous abnormality. IMPRESSION: 1. Endotracheal tube with the tip 4.7 cm above the carina. 2. Nasogastric tube coursing below the diaphragm with the tip excluded from the field of view. 3. Patchy airspace disease throughout the left lung which may reflect multilobar pneumonia versus aspiration pneumonia versus pulmonary contusions. Electronically Signed   By: Kathreen Devoid M.D.   On: 11/20/2021 16:32   DG Humerus Left  Result Date: 12/08/2021 CLINICAL DATA:  [place], swelling EXAM: LEFT HUMERUS - 2+ VIEW COMPARISON:  None. FINDINGS: Frontal and lateral views of the left humerus are obtained. No acute fracture. Alignment of the left shoulder and elbow is anatomic. Soft tissues are unremarkable. Visualized portions of the left  chest demonstrate stable airspace disease. IMPRESSION: 1. Unremarkable left humerus. 2. Stable left-sided airspace disease. Electronically Signed   By: Randa Ngo M.D.   On: 12/05/2021 17:14   CT CHEST ABDOMEN PELVIS WO CONTRAST  Result Date: 12/14/2021 CLINICAL DATA:  Level 1 trauma. Found unresponsive, lying left-side-down for prolonged period of time. EXAM: CT CHEST, ABDOMEN AND PELVIS WITHOUT CONTRAST TECHNIQUE: Multidetector CT imaging of the chest, abdomen and pelvis was performed following the standard protocol without IV contrast. RADIATION DOSE REDUCTION: This exam was performed according to the departmental dose-optimization program which includes automated exposure control, adjustment of the mA and/or kV according to patient size and/or use of iterative reconstruction technique. COMPARISON:  Chest radiograph earlier today. CT abdomen and pelvis 09/06/2014. Chest  CT 02/21/2010. FINDINGS: CT CHEST FINDINGS Cardiovascular: Limited assessment for acute vascular injury in the absence of IV contrast. Normal caliber of the thoracic aorta. Normal heart size. No pericardial effusion. Mediastinum/Nodes: No mediastinal hematoma. No enlarged axillary, mediastinal, or hilar lymph nodes identified within limitations of noncontrast technique. Unremarkable thyroid. Endotracheal tube terminating above the carina. Enteric tube coursing into the stomach. Lungs/Pleura: No pleural effusion or pneumothorax. Widespread areas of patchy consolidation throughout the left upper and lower lobes. 2.5 cm region of cavitation within an area of consolidation in the anterior left upper lobe. Bronchial wall thickening with opacification multiple small bronchi in the left upper and lower lobes. Clear right lung. Musculoskeletal: No acute fracture or suspicious osseous lesion. Bilateral glenohumeral arthropathy with calcified bodies in the right subcoracoid region. Partially visualized diffuse soft tissue edema involving the left  arm. CT ABDOMEN PELVIS FINDINGS Hepatobiliary: No focal liver abnormality is seen on this unenhanced study. No perihepatic hematoma. Unremarkable gallbladder. No biliary dilatation. Pancreas: Unremarkable. Spleen: Unremarkable. Adrenals/Urinary Tract: Mild bilateral adrenal gland thickening. No evidence of an acute renal injury on this unenhanced study. No perinephric hematoma. Small hypodensities in both kidneys, incompletely characterized given small size and absence of IV contrast though potentially reflecting cysts. No renal calculi or hydronephrosis. Unremarkable bladder. Stomach/Bowel: An enteric tube is looped in the proximal stomach with tip in the region of the gastric cardia. No bowel dilatation or bowel wall thickening is evident. The appendix is unremarkable. Vascular/Lymphatic: Normal caliber of the abdominal aorta. IVC filter in place. No enlarged lymph nodes. Reproductive: Mildly enlarged prostate. Other: No intraperitoneal free fluid or free air. Unchanged small fat containing umbilical hernia. Musculoskeletal: No acute osseous abnormality or suspicious osseous lesion. Mild disc and moderately advanced facet degeneration in the lumbar spine. IMPRESSION: 1. Widespread patchy consolidation throughout the left lung suspicious for aspiration pneumonia. Small area of cavitation anteriorly in the left upper lobe. 2. No evidence of acute traumatic injury in the abdomen or pelvis. The study was reviewed in person with Dr. Georganna Skeans on 11/18/2021 at 4:50 p.m. Electronically Signed   By: Logan Bores M.D.   On: 11/26/2021 17:29   CT Maxillofacial Wo Contrast  Result Date: 11/26/2021 CLINICAL DATA:  Level 1 trauma.  Found unresponsive. EXAM: CT HEAD WITHOUT CONTRAST CT MAXILLOFACIAL WITHOUT CONTRAST CT CERVICAL SPINE WITHOUT CONTRAST TECHNIQUE: Multidetector CT imaging of the head, cervical spine, and maxillofacial structures were performed using the standard protocol without intravenous contrast.  Multiplanar CT image reconstructions of the cervical spine and maxillofacial structures were also generated. RADIATION DOSE REDUCTION: This exam was performed according to the departmental dose-optimization program which includes automated exposure control, adjustment of the mA and/or kV according to patient size and/or use of iterative reconstruction technique. COMPARISON:  Head CT 03/17/2017 FINDINGS: CT HEAD FINDINGS Brain: An acute hemorrhage involving the right lentiform nucleus/external capsule measures 4.1 x 2.4 x 3.2 cm (approximate volume 16 mL). There is mild surrounding edema with mass effect on the right lateral ventricle and 4 mm of leftward midline shift. A smaller acute hemorrhage anteriorly in the left basal ganglia/anterior limb of left internal capsule measures 1.5 cm with minimal surrounding edema. A small acute right thalamic hemorrhage measures 5 mm. There is also a 4 mm acute hemorrhage anteriorly in the left temporal lobe without significant edema. No acute cortically based infarct or extra-axial fluid collection is identified. There is no hydrocephalus. There are chronic left larger than right cerebellar infarcts. Left thalamic lacunar infarcts are new and  have a more chronic appearance anteriorly and more acute to subacute appearance posteriorly. Patchy hypodensities in the cerebral white matter bilaterally are nonspecific but compatible with mild-to-moderate chronic small vessel ischemic disease. Vascular: No hyperdense vessel. Skull: No acute calvarial fracture.  Left frontal scalp swelling. Other: None. CT MAXILLOFACIAL FINDINGS Osseous: No acute fracture or mandibular dislocation. Remote blowout fracture involving the medial wall and floor of the left orbit, also present in 2018. Multiple missing teeth. Periapical lucency involving the left mandibular central incisor with evidence of prior root canal. Orbits: Left periorbital soft tissue swelling. Grossly intact globes. No retrobulbar  hematoma. Sinuses: Opacification of a hypoplastic left frontal sinus and of multiple left ethmoid air cells. Mucosal thickening and small volume fluid in the left maxillary sinus. Clear mastoid air cells. Soft tissues: Left facial soft tissue swelling diffusely. CT CERVICAL SPINE FINDINGS Alignment: Cervical spine straightening.  No listhesis. Skull base and vertebrae: No acute fracture or suspicious osseous lesion. Soft tissues and spinal canal: No prevertebral fluid or swelling. No visible canal hematoma. Disc levels: Mild-to-moderate cervical spondylosis. Moderate to severe right neural foraminal stenosis at C3-4 due to asymmetric uncovertebral spurring. Upper chest: Reported separately. Other: Partially visualized endotracheal and enteric tubes. IMPRESSION: 1. 4 cm acute right basal ganglia hemorrhage with mild edema and 4 mm of leftward midline shift. 2. Smaller acute hemorrhages in the left basal ganglia, right thalamus, and left temporal lobe. 3. Suspected acute/subacute left thalamic infarct. 4. Chronic cerebellar infarcts. 5. Left-sided scalp and facial soft tissue swelling. 6. No acute maxillofacial or cervical spine fracture. 7. Remote left orbital fracture. Critical Value/emergent results were reviewed in person with Dr. Georganna Skeans on 11/27/2021 at 4:50 p.m. Electronically Signed   By: Logan Bores M.D.   On: 12/08/2021 17:15    Procedures Procedures    CRITICAL CARE Performed by: Wandra Arthurs   Total critical care time: 30 minutes  Critical care time was exclusive of separately billable procedures and treating other patients.  Critical care was necessary to treat or prevent imminent or life-threatening deterioration.  Critical care was time spent personally by me on the following activities: development of treatment plan with patient and/or surrogate as well as nursing, discussions with consultants, evaluation of patient's response to treatment, examination of patient, obtaining  history from patient or surrogate, ordering and performing treatments and interventions, ordering and review of laboratory studies, ordering and review of radiographic studies, pulse oximetry and re-evaluation of patient's condition.  INTUBATION Performed by: Wandra Arthurs  Required items: required blood products, implants, devices, and special equipment available Patient identity confirmed: provided demographic data and hospital-assigned identification number Time out: Immediately prior to procedure a "time out" was called to verify the correct patient, procedure, equipment, support staff and site/side marked as required.  Indications: lethargy, hypoxia, AMS  Intubation method: Glidescope Laryngoscopy   Preoxygenation: BVM  Sedatives: 20 mg Etomidate Paralytic: 100 mg rocuronium   Tube Size: 7.5 cuffed  Post-procedure assessment: chest rise and ETCO2 monitor Breath sounds: equal and absent over the epigastrium Tube secured with: ETT holder Chest x-ray interpreted by radiologist and me.  Chest x-ray findings: endotracheal tube in appropriate position  Patient tolerated the procedure well with no immediate complications.    Medications Ordered in ED Medications  etomidate (AMIDATE) injection (20 mg Intravenous Given 11/20/2021 1613)  0.9 %  sodium chloride infusion (has no administration in time range)  norepinephrine (LEVOPHED) 4mg  in 252mL (0.016 mg/mL) premix infusion (0 mcg/min Intravenous Paused 12/09/2021 1741)  rocuronium [facility]) injection (100 mg Intravenous Given 11/19/2021 1613)  lactated ringers bolus (0 mLs  Stopped 11/18/2021 1741)  sodium chloride 0.9 % bolus (0 mLs  Stopped 12/09/2021 1741)  vancomycin (VANCOCIN) IVPB 1000 mg/200 mL premix (1,000 mg Intravenous New Bag/Given 12/07/2021 1741)  ceFEPIme (MAXIPIME) 1 g in sodium chloride 0.9 % 100 mL IVPB (1 g Intravenous New Bag/Given 11/21/2021 1804)  sodium bicarbonate 150 mEq in dextrose 5 % 1,150 mL infusion ( Intravenous New  Bag/Given 12/08/2021 1803)   stroke: mapping our early stages of recovery book (has no administration in time range)  acetaminophen (TYLENOL) tablet 650 mg (has no administration in time range)    Or  acetaminophen (TYLENOL) 160 MG/5ML solution 650 mg (has no administration in time range)    Or  acetaminophen (TYLENOL) suppository 650 mg (has no administration in time range)  senna-docusate (Senokot-S) tablet 1 tablet (has no administration in time range)  pantoprazole (PROTONIX) injection 40 mg (has no administration in time range)  clevidipine (CLEVIPREX) infusion 0.5 mg/mL (has no administration in time range)  sodium chloride 0.9 % bolus 2,000 mL (0 mLs Intravenous Stopped 11/28/2021 1805)  dexamethasone (DECADRON) injection 10 mg (10 mg Intravenous Given 11/17/2021 1725)  sodium bicarbonate injection 50 mEq (50 mEq Intravenous Given 12/10/2021 1800)    ED Course/ Medical Decision Making/ A&P                           Medical Decision Making Darryl Parsons is a 66 y.o. male here presenting with altered mental status.  Patient was altered in the hotel room.  Patient was noted to have abrasion to the left face and also stage III decub ulcer on the left side.  Concerned that he may be in rhabdomyolysis.  Given possible facial trauma and left-sided trauma, level 1 trauma was activated.  Trauma surgery recommend CT head and face and neck and chest abdomen pelvis.  I am concerned that he may be in rhabdomyolysis so CK level added.  Patient is also altered and intubated patient for airway protection.    5:34 PM Patient CT showed acute right basal ganglia hemorrhage with edema.  Patient's blood pressure goes from 120s to 150s.  Patient has no spontaneous movement currently after intubation.  Patient is in acute renal failure with creatinine of 7.  Consulted neurology to see patient.  Since patient's blood pressure is in the 130s now, will hold off on Cleviprex.  Patient has multisystem failure.  He has acute  renal failure and has aspiration pneumonia and is in mild rhabdomyolysis and has multiple decub ulcers.  ICU to admit patient.    Problems Addressed: AKI (acute kidney injury) ([place]): acute illness or injury Aspiration pneumonia, unspecified aspiration pneumonia type, unspecified laterality, unspecified part of lung ([place]): acute illness or injury Nontraumatic intracerebral hemorrhage, unspecified cerebral location, unspecified laterality [facility]): acute illness or injury Pressure injury of contiguous region involving left buttock and hip, stage 3 ([place]): acute illness or injury Trauma: acute illness or injury  Amount and/or Complexity of Data Reviewed Independent Historian: EMS External Data Reviewed: labs and notes. Labs: ordered. Decision-making details documented in ED Course. Radiology: ordered and independent interpretation performed. Decision-making details documented in ED Course. ECG/medicine tests: ordered and independent interpretation performed. Decision-making details documented in ED Course.  Risk Prescription drug management. Decision regarding hospitalization. Diagnosis or treatment significantly limited by social determinants of health.  Final Clinical Impression(s) / ED Diagnoses Final diagnoses:  Trauma  Nontraumatic intracerebral hemorrhage, unspecified cerebral location, unspecified laterality ([place])  AKI (acute kidney injury) ([place])  Aspiration pneumonia, unspecified aspiration pneumonia type, unspecified laterality, unspecified part of lung ([place])  Pressure injury of contiguous region involving left buttock and hip, stage 3 [facility])    Rx / DC Orders ED Discharge Orders     None         Drenda Freeze, MD 12/01/2021 [phone]

## 2021-12-15 NOTE — Progress Notes (Signed)
Critical ABG results given to Dr Darl Householder.

## 2021-12-15 NOTE — Progress Notes (Signed)
Pharmacy Antibiotic Note  Darryl Parsons is a 66 y.o. male admitted on 11/15/2021 with aspiration pneumonia.  Pharmacy has been consulted for Vancomycin/cefepime dosing.  Scr 7.2 2/2 being down for prolonged period  Plan: Vancomycin/cefepime x1 in the ED Transitioned to Unasyn 3gm q24hr based on renal function Will monitor for acute changes in renal function and adjust as needed F/u cultures results and de-escalate as appropriate   Height: 5\' 10"  (177.8 cm) Weight: 56.7 kg (125 lb) IBW/kg (Calculated) : 73  Temp (24hrs), Avg:96.8 F (36 C), Min:96.3 F (35.7 C), Max:96.9 F (36.1 C)  Recent Labs  Lab 12/14/2021 1616 12/05/2021 1627  WBC 12.7*  --   CREATININE 7.04* 7.20*  LATICACIDVEN 6.6*  --      Estimated Creatinine Clearance: 8.3 mL/min (A) (by C-G formula based on SCr of 7.2 mg/dL (H)).    No Known Allergies  Thank you for allowing pharmacy to be a part of this patients care.  Donnald Garre, PharmD Clinical Pharmacist  Please check AMION for all [place] numbers After 10:00 PM, call [place] [phone]

## 2021-12-15 NOTE — Consult Note (Signed)
Reason for Consult:found down, ?facial trauma Referring Physician: Suren Payne is an 66 y.o. male.  HPI: 66yo M with PMHx CKD, CVA, cocaine use, and schizophrenia was brought in as a level 1 trauma. He was found down in his hotel room between the bed and wall. He had not been seen since Sunday per the desk clerk. ?faical trauma and GCS 5 on scene so he came in as a trauma. On arrival, spont resp but GCS 3. He was intubated by the EDP. He appears to have pressure woulnd along L side. BP was low so he was given fluids and levo started.   No family history on file.  Social History:  has no history on file for tobacco use, alcohol use, and drug use.  Allergies: Not on File  Medications: .  Results for orders placed or performed during the hospital encounter of 11/22/2021 (from the past 48 hour(s))  I-Stat Chem 8, ED     Status: Abnormal   Collection Time: 11/15/2021  4:27 PM  Result Value Ref Range   Sodium 146 (H) 135 - 145 mmol/L   Potassium 4.7 3.5 - 5.1 mmol/L   Chloride 115 (H) 98 - 111 mmol/L   BUN >130 (H) 8 - 23 mg/dL   Creatinine, Ser 7.20 (H) 0.61 - 1.24 mg/dL   Glucose, Bld 158 (H) 70 - 99 mg/dL    Comment: Glucose reference range applies only to samples taken after fasting for at least 8 hours.   Calcium, Ion 1.08 (L) 1.15 - 1.40 mmol/L   TCO2 18 (L) 22 - 32 mmol/L   Hemoglobin 17.3 (H) 13.0 - 17.0 g/dL   HCT 51.0 39.0 - 52.0 %    No results found.  Review of Systems Blood pressure 100/80, pulse (!) 122, resp. rate 20, height 5\' 8"  (1.727 m), SpO2 (!) 80 %. Physical Exam Constitutional:      Appearance: He is ill-appearing.  HENT:     Head:     Comments: Erythema and swelling along L face, dried mucous in nose    Right Ear: External ear normal.     Left Ear: External ear normal.     Nose:     Comments: A lot of dried mucous    Mouth/Throat:     Mouth: Mucous membranes are dry.  Eyes:     Pupils: Pupils are equal, round, and reactive to light.      Comments: small  Cardiovascular:     Rate and Rhythm: Tachycardia present.     Pulses: Normal pulses.     Heart sounds: Normal heart sounds.  Pulmonary:     Comments: Tachypnic Rhonchi R after intubation Abdominal:     General: Abdomen is flat. There is no distension.     Palpations: Abdomen is soft.     Tenderness: There is no abdominal tenderness. There is no guarding or rebound.     Comments: Large abrasion/pressure wound L side and LLQ  Genitourinary:    Penis: Normal.   Musculoskeletal:        General: Swelling present.     Cervical back: No rigidity.     Comments: Sig edema LUE without bony deformity  Skin:    General: Skin is dry.     Findings: Lesion present.     Comments: Large pressure wound L back. Full thickness pressure wound L hip  Neurological:     Comments: GCS 3    Assessment/Plan: Found Down AKI Likely rhabdomyolysis  Acute hypoxic ventilator dependent respiratory failure Large R basal ganglia and small L basal ganglia hemorrhagic strokes Multiple pressure wounds Hypotension - on levophed  No acute injuries Recommend CCM admit and Neurology consult, [place] consult Critical care 70min [place] Jarred 12/06/2021, 4:33 PM

## 2021-12-15 NOTE — ED Triage Notes (Signed)
Patient arrives to ED BIB [facility] as a Level 1 Trauma who was found unresponsive  in a Hotel room. Per EMS Pt was found on the floor of his hotel room between the bed and the wall. Per EMS hotel staff states Pt was last seen 2 days ago. Per EMS Pt was agonal breathing and was administered 1mg  Narcan IN but had no improvement. Per EMS Pt was then bagged and began no move arms. Pt has facial trauma with significant swelling to Left eye and forehead. Pin point pupils with right sided gaze. Pt also has a pressure to the Left outer thigh. Pt Intubated on arrival to ED.  BP 84/50 HR 130

## 2021-12-16 ENCOUNTER — Inpatient Hospital Stay (HOSPITAL_COMMUNITY): Payer: Medicaid Other

## 2021-12-16 ENCOUNTER — Encounter (HOSPITAL_COMMUNITY): Payer: Self-pay | Admitting: Internal Medicine

## 2021-12-16 DIAGNOSIS — I619 Nontraumatic intracerebral hemorrhage, unspecified: Secondary | ICD-10-CM | POA: Diagnosis not present

## 2021-12-16 DIAGNOSIS — I6389 Other cerebral infarction: Secondary | ICD-10-CM | POA: Diagnosis not present

## 2021-12-16 DIAGNOSIS — I5030 Unspecified diastolic (congestive) heart failure: Secondary | ICD-10-CM

## 2021-12-16 DIAGNOSIS — I517 Cardiomegaly: Secondary | ICD-10-CM

## 2021-12-16 LAB — CBC
HCT: 39.7 % (ref 39.0–52.0)
Hemoglobin: 13.5 g/dL (ref 13.0–17.0)
MCH: 27.2 pg (ref 26.0–34.0)
MCHC: 34 g/dL (ref 30.0–36.0)
MCV: 80 fL (ref 80.0–100.0)
Platelets: 175 10*3/uL (ref 150–400)
RBC: 4.96 MIL/uL (ref 4.22–5.81)
RDW: 14.8 % (ref 11.5–15.5)
WBC: 19.7 10*3/uL — ABNORMAL HIGH (ref 4.0–10.5)
nRBC: 1 % — ABNORMAL HIGH (ref 0.0–0.2)

## 2021-12-16 LAB — RAPID HIV SCREEN (HIV 1/2 AB+AG)
HIV 1/2 Antibodies: NONREACTIVE
HIV-1 P24 Antigen - HIV24: NONREACTIVE

## 2021-12-16 LAB — COMPREHENSIVE METABOLIC PANEL
ALT: 57 U/L — ABNORMAL HIGH (ref 0–44)
AST: 93 U/L — ABNORMAL HIGH (ref 15–41)
Albumin: 1.7 g/dL — ABNORMAL LOW (ref 3.5–5.0)
Alkaline Phosphatase: 38 U/L (ref 38–126)
Anion gap: 18 — ABNORMAL HIGH (ref 5–15)
BUN: 209 mg/dL — ABNORMAL HIGH (ref 8–23)
CO2: 20 mmol/L — ABNORMAL LOW (ref 22–32)
Calcium: 7.5 mg/dL — ABNORMAL LOW (ref 8.9–10.3)
Chloride: 114 mmol/L — ABNORMAL HIGH (ref 98–111)
Creatinine, Ser: 6.88 mg/dL — ABNORMAL HIGH (ref 0.61–1.24)
GFR, Estimated: 8 mL/min — ABNORMAL LOW (ref >60)
Glucose, Bld: 145 mg/dL — ABNORMAL HIGH (ref 70–99)
Potassium: 4.3 mmol/L (ref 3.5–5.1)
Sodium: 152 mmol/L — ABNORMAL HIGH (ref 135–145)
Total Bilirubin: 1.2 mg/dL (ref 0.3–1.2)
Total Protein: 5.1 g/dL — ABNORMAL LOW (ref 6.5–8.1)

## 2021-12-16 LAB — POCT I-STAT 7, (LYTES, BLD GAS, ICA,H+H)
Acid-base deficit: 3 mmol/L — ABNORMAL HIGH (ref 0.0–2.0)
Bicarbonate: 19.7 mmol/L — ABNORMAL LOW (ref 20.0–28.0)
Calcium, Ion: 1.06 mmol/L — ABNORMAL LOW (ref 1.15–1.40)
HCT: 34 % — ABNORMAL LOW (ref 39.0–52.0)
Hemoglobin: 11.6 g/dL — ABNORMAL LOW (ref 13.0–17.0)
O2 Saturation: 97 %
Patient temperature: 97.5
Potassium: 4.1 mmol/L (ref 3.5–5.1)
Sodium: 149 mmol/L — ABNORMAL HIGH (ref 135–145)
TCO2: 21 mmol/L — ABNORMAL LOW (ref 22–32)
pCO2 arterial: 26.4 mmHg — ABNORMAL LOW (ref 32.0–48.0)
pH, Arterial: 7.48 — ABNORMAL HIGH (ref 7.350–7.450)
pO2, Arterial: 77 mmHg — ABNORMAL LOW (ref 83.0–108.0)

## 2021-12-16 LAB — RENAL FUNCTION PANEL
Albumin: 1.7 g/dL — ABNORMAL LOW (ref 3.5–5.0)
Anion gap: 17 — ABNORMAL HIGH (ref 5–15)
BUN: 211 mg/dL — ABNORMAL HIGH (ref 8–23)
CO2: 19 mmol/L — ABNORMAL LOW (ref 22–32)
Calcium: 7.8 mg/dL — ABNORMAL LOW (ref 8.9–10.3)
Chloride: 117 mmol/L — ABNORMAL HIGH (ref 98–111)
Creatinine, Ser: 6.95 mg/dL — ABNORMAL HIGH (ref 0.61–1.24)
GFR, Estimated: 8 mL/min — ABNORMAL LOW (ref >60)
Glucose, Bld: 163 mg/dL — ABNORMAL HIGH (ref 70–99)
Phosphorus: 5.1 mg/dL — ABNORMAL HIGH (ref 2.5–4.6)
Potassium: 4.3 mmol/L (ref 3.5–5.1)
Sodium: 153 mmol/L — ABNORMAL HIGH (ref 135–145)

## 2021-12-16 LAB — TSH: TSH: 0.717 u[IU]/mL (ref 0.350–4.500)

## 2021-12-16 LAB — LIPID PANEL
Cholesterol: 110 mg/dL (ref 0–200)
HDL: 31 mg/dL — ABNORMAL LOW (ref >40)
LDL Cholesterol: 55 mg/dL (ref 0–99)
Total CHOL/HDL Ratio: 3.5 RATIO
Triglycerides: 118 mg/dL (ref <150)
VLDL: 24 mg/dL (ref 0–40)

## 2021-12-16 LAB — RAPID URINE DRUG SCREEN, HOSP PERFORMED
Amphetamines: POSITIVE — AB
Barbiturates: NOT DETECTED
Benzodiazepines: NOT DETECTED
Cocaine: NOT DETECTED
Opiates: NOT DETECTED
Tetrahydrocannabinol: NOT DETECTED

## 2021-12-16 LAB — GLUCOSE, CAPILLARY
Glucose-Capillary: 120 mg/dL — ABNORMAL HIGH (ref 70–99)
Glucose-Capillary: 127 mg/dL — ABNORMAL HIGH (ref 70–99)
Glucose-Capillary: 134 mg/dL — ABNORMAL HIGH (ref 70–99)
Glucose-Capillary: 134 mg/dL — ABNORMAL HIGH (ref 70–99)
Glucose-Capillary: 136 mg/dL — ABNORMAL HIGH (ref 70–99)
Glucose-Capillary: 138 mg/dL — ABNORMAL HIGH (ref 70–99)
Glucose-Capillary: 153 mg/dL — ABNORMAL HIGH (ref 70–99)

## 2021-12-16 LAB — TRIGLYCERIDES: Triglycerides: 122 mg/dL (ref <150)

## 2021-12-16 LAB — ECHOCARDIOGRAM COMPLETE
AR max vel: 3.25 cm2
AV Area VTI: 3.75 cm2
AV Area mean vel: 3.37 cm2
AV Mean grad: 6 mmHg
AV Peak grad: 13.1 mmHg
Ao pk vel: 1.81 m/s
Area-P 1/2: 3.34 cm2
Calc EF: 58.8 %
Height: 70 in
S' Lateral: 1.9 cm
Single Plane A2C EF: 55.1 %
Single Plane A4C EF: 62.8 %
Weight: 2000 oz

## 2021-12-16 LAB — SODIUM
Sodium: 155 mmol/L — ABNORMAL HIGH (ref 135–145)
Sodium: 155 mmol/L — ABNORMAL HIGH (ref 135–145)
Sodium: 152 mmol/L — ABNORMAL HIGH (ref 135–145)

## 2021-12-16 LAB — RPR: RPR Ser Ql: NONREACTIVE

## 2021-12-16 LAB — FOLATE: Folate: 5.3 ng/mL — ABNORMAL LOW (ref >5.9)

## 2021-12-16 LAB — PHOSPHORUS: Phosphorus: 6.4 mg/dL — ABNORMAL HIGH (ref 2.5–4.6)

## 2021-12-16 LAB — VITAMIN B12: Vitamin B-12: 312 pg/mL (ref 180–914)

## 2021-12-16 LAB — CK: Total CK: 992 U/L — ABNORMAL HIGH (ref 49–397)

## 2021-12-16 LAB — AMMONIA: Ammonia: 43 umol/L — ABNORMAL HIGH (ref 9–35)

## 2021-12-16 LAB — MAGNESIUM: Magnesium: 3.4 mg/dL — ABNORMAL HIGH (ref 1.7–2.4)

## 2021-12-16 MED ORDER — FENTANYL CITRATE PF 50 MCG/ML IJ SOSY
PREFILLED_SYRINGE | INTRAMUSCULAR | Status: AC
  Filled 2021-12-16: qty 2

## 2021-12-16 MED ORDER — CALCIUM GLUCONATE-NACL 1-0.675 GM/50ML-% IV SOLN
1.0000 g | Freq: Once | INTRAVENOUS | Status: AC
  Administered 2021-12-16: 1000 mg via INTRAVENOUS
  Filled 2021-12-16: qty 50

## 2021-12-16 MED ORDER — VITAL 1.5 CAL PO LIQD
1000.0000 mL | ORAL | Status: DC
  Administered 2021-12-16: 1000 mL

## 2021-12-16 MED ORDER — HEPARIN SODIUM (PORCINE) 1000 UNIT/ML DIALYSIS
1000.0000 [IU] | INTRAMUSCULAR | Status: DC | PRN
  Administered 2021-12-16 – 2021-12-17 (×3): 3000 [IU] via INTRAVENOUS_CENTRAL
  Administered 2021-12-18 (×2): 2400 [IU] via INTRAVENOUS_CENTRAL
  Filled 2021-12-16: qty 6
  Filled 2021-12-16: qty 4
  Filled 2021-12-16: qty 2
  Filled 2021-12-16: qty 3
  Filled 2021-12-16: qty 6
  Filled 2021-12-16: qty 3
  Filled 2021-12-16: qty 2
  Filled 2021-12-16 (×4): qty 6

## 2021-12-16 MED ORDER — SODIUM CHLORIDE 0.9 % IV SOLN
3.0000 g | Freq: Three times a day (TID) | INTRAVENOUS | Status: DC
  Administered 2021-12-16 – 2021-12-18 (×5): 3 g via INTRAVENOUS
  Filled 2021-12-16 (×5): qty 8

## 2021-12-16 MED ORDER — ORAL CARE MOUTH RINSE
15.0000 mL | OROMUCOSAL | Status: DC
  Administered 2021-12-16 – 2021-12-19 (×35): 15 mL via OROMUCOSAL

## 2021-12-16 MED ORDER — PRISMASOL BGK 4/2.5 32-4-2.5 MEQ/L REPLACEMENT SOLN
Status: DC
  Filled 2021-12-16 (×6): qty 5000

## 2021-12-16 MED ORDER — PRISMASOL BGK 4/2.5 32-4-2.5 MEQ/L EC SOLN
Status: DC
  Filled 2021-12-16 (×15): qty 5000

## 2021-12-16 MED ORDER — SODIUM CHLORIDE 0.9 % FOR CRRT
500.0000 mL | INTRAVENOUS_CENTRAL | Status: DC | PRN
  Administered 2021-12-16: 4000 mL via INTRAVENOUS_CENTRAL

## 2021-12-16 MED ORDER — SODIUM CHLORIDE 0.9 % IV SOLN: INTRAVENOUS | Status: DC | PRN

## 2021-12-16 MED ORDER — PROSOURCE TF PO LIQD
45.0000 mL | Freq: Three times a day (TID) | ORAL | Status: DC
  Administered 2021-12-16 – 2021-12-17 (×4): 45 mL
  Filled 2021-12-16 (×4): qty 45

## 2021-12-16 MED ORDER — PROPOFOL 1000 MG/100ML IV EMUL
0.0000 ug/kg/min | INTRAVENOUS | Status: DC
  Administered 2021-12-16: 10 ug/kg/min via INTRAVENOUS
  Administered 2021-12-16: 30 ug/kg/min via INTRAVENOUS
  Administered 2021-12-16: 10 ug/kg/min via INTRAVENOUS
  Administered 2021-12-17: 30 ug/kg/min via INTRAVENOUS
  Administered 2021-12-18: 10 ug/kg/min via INTRAVENOUS
  Administered 2021-12-18: 15 ug/kg/min via INTRAVENOUS
  Administered 2021-12-19: 20 ug/kg/min via INTRAVENOUS
  Filled 2021-12-16 (×6): qty 100

## 2021-12-16 MED ORDER — CHLORHEXIDINE GLUCONATE 0.12% ORAL RINSE (MEDLINE KIT)
15.0000 mL | Freq: Two times a day (BID) | OROMUCOSAL | Status: DC
  Administered 2021-12-16 – 2021-12-19 (×7): 15 mL via OROMUCOSAL

## 2021-12-16 MED ORDER — CHLORHEXIDINE GLUCONATE CLOTH 2 % EX PADS
6.0000 | MEDICATED_PAD | Freq: Every day | CUTANEOUS | Status: DC
  Administered 2021-12-16 – 2021-12-19 (×3): 6 via TOPICAL

## 2021-12-16 MED ORDER — FOLIC ACID 1 MG PO TABS
1.0000 mg | ORAL_TABLET | Freq: Every day | ORAL | Status: DC
  Administered 2021-12-16 – 2021-12-17 (×2): 1 mg
  Filled 2021-12-16 (×3): qty 1

## 2021-12-16 MED ORDER — THIAMINE HCL 100 MG PO TABS
100.0000 mg | ORAL_TABLET | Freq: Every day | ORAL | Status: DC
  Administered 2021-12-16 – 2021-12-17 (×2): 100 mg
  Filled 2021-12-16 (×3): qty 1

## 2021-12-16 MED ORDER — VITAL HIGH PROTEIN PO LIQD: 1000.0000 mL | ORAL | Status: DC

## 2021-12-16 MED ORDER — POLYETHYLENE GLYCOL 3350 17 G PO PACK
17.0000 g | PACK | Freq: Every day | ORAL | Status: DC
  Administered 2021-12-16 – 2021-12-17 (×2): 17 g
  Filled 2021-12-16 (×2): qty 1

## 2021-12-16 MED ORDER — PROSOURCE TF PO LIQD: 45.0000 mL | Freq: Two times a day (BID) | ORAL | Status: DC

## 2021-12-16 MED ORDER — COLLAGENASE 250 UNIT/GM EX OINT
TOPICAL_OINTMENT | Freq: Every day | CUTANEOUS | Status: DC
  Administered 2021-12-16 – 2021-12-18 (×2): 1 via TOPICAL
  Filled 2021-12-16: qty 30

## 2021-12-16 MED ORDER — PRISMASOL BGK 4/2.5 32-4-2.5 MEQ/L REPLACEMENT SOLN
Status: DC
Start: 2021-12-16 — End: 2021-12-18
  Filled 2021-12-16 (×5): qty 5000

## 2021-12-16 MED ORDER — FENTANYL CITRATE PF 50 MCG/ML IJ SOSY
100.0000 ug | PREFILLED_SYRINGE | Freq: Once | INTRAMUSCULAR | Status: AC
  Administered 2021-12-16: 100 ug via INTRAVENOUS

## 2021-12-16 MED ORDER — VANCOMYCIN HCL 500 MG/100ML IV SOLN
500.0000 mg | INTRAVENOUS | Status: DC
  Administered 2021-12-16: 500 mg via INTRAVENOUS
  Filled 2021-12-16 (×2): qty 100

## 2021-12-16 NOTE — Progress Notes (Signed)
SLP Cancellation Note  Patient Details Name: Darryl Parsons MRN: [id number] DOB: 1956-09-10   Cancelled treatment:       Reason Eval/Treat Not Completed: Medical issues which prohibited therapy (Pt is currently on the vent. SLP will follow up.)  Naman Spychalski I. Hardin Negus, [place], [place] Office number [phone] Pager [place] 12/16/2021, 8:35 AM

## 2021-12-16 NOTE — Procedures (Addendum)
Patient Name: Darryl Parsons  MRN: [id number]  Epilepsy Attending: Lora Havens  Referring Physician/Provider: Donnetta Simpers, MD Duration: 12/16/2021 0322 to 1155  Patient history: 66 year old male with multiple subcortical ICH with altered mental status.  EEG evaluate for seizure.  Level of alertness: comatose  AEDs during EEG study: None  Technical aspects: This EEG study was done with scalp electrodes positioned according to the 10-20 International system of electrode placement. Electrical activity was acquired at a sampling rate of 500Hz  and reviewed with a high frequency filter of 70Hz  and a low frequency filter of 1Hz . EEG data were recorded continuously and digitally stored.   Description: EEG showed continuous generalized and lateralized right hemisphere 3 to 7 Hz theta-delta slowing. Hyperventilation and photic stimulation were not performed.     ABNORMALITY - Continuous slow, generalized and lateralized right hemisphere  IMPRESSION: This study is suggestive of cortical dysfunction arising from right hemisphere likely secondary to underlying structural abnormality/bleed.  Additionally there is moderate to severe diffuse encephalopathy, nonspecific to etiology.  No seizures or epileptiform discharges were seen throughout the recording.  Ita Fritzsche Barbra Sarks

## 2021-12-16 NOTE — Progress Notes (Signed)
Attempted to D/C EEG. Pt is in a  sterile procedure right now. Will attempt later when schedule permits

## 2021-12-16 NOTE — Progress Notes (Signed)
NAME:  Darryl Parsons, MRN:  [id number], DOB:  1956-03-08, LOS: 1 ADMISSION DATE:  11/15/2021, CONSULTATION DATE:  12/16/21 REFERRING MD:  Darl Householder, CHIEF COMPLAINT:  unresponsive   History of Present Illness:  66 year old male with prior history of schizophrenia, cocaine abuse, CVA, and CKD who presented to ER as level 1 trauma.  Patient found unresponsive and agonal in a hotel room between the wall and bed with significant swelling to left eye and forehead and pressure wound on left exterior thigh with pinpoint pupils and right gaze.  Last seen 2 days ago.  Given narcan with no change.  On arrival to ER, he was hypotensive 84/50, HR 130, and was intubated on arrival to ER and underwent trauma evaluation.  Found on workup to have acute right basal ganglia hemorrhage with mild edema and left MLS 52mm with smaller acute left basal ganglia hemorrhages, and acute vs subacute left thalamic infarcts, AKI with concern for rhabdo, lactic acidosis, and suspected aspiration pneumonia. He is requiring levophed for ongoing hypotension.  Neuro consulted and PCCM called for admit.   Pertinent  Medical History   has no past medical history on file.   Duplicate chart MRN: [id number]  Visual hallucinations Bipolar/Depression, ?major Cocaine abuse HTn CKD1 Prior GIB in the past 2/2 to internal hemorrhoids Recent admit to [facility] 10.12.--10.17 Has a repeated pattern of OD and ingestion and reported self-harm  Significant Hospital Events: Including procedures, antibiotic start and stop dates in addition to other pertinent events   1/31 Brought in by EMS, intubated, found to have [place], neuro consulted, on cEEG, unasyn started, on HTS    1/31 SARS/ flu > neg 1/31 Bcx2 >  1/31 UC >  1/31 MRSA PCR > positive   1/31 vanc 1/31 cefepime x1 1/31 unasyn >   Interim History / Subjective:   Remains on cEEG No clinical seizures  Needing prn fentanyl overnight for tachypnea, tachycardia, vent synchrony Foley leaking  overnight therefore removed Remains on HTS at 75 ml/hr  Objective   Blood pressure 109/73, pulse (!) 107, temperature 99.5 F (37.5 C), temperature source Axillary, resp. rate (!) 33, height 5\' 10"  (1.778 m), weight 56.7 kg, SpO2 97 %.    Vent Mode: PRVC FiO2 (%):  [60 %-100 %] 60 % Set Rate:  [16 bmp-20 bmp] 16 bmp Vt Set:  [550 mL] 550 mL PEEP:  [5 cmH20-8 cmH20] 8 cmH20 Plateau Pressure:  [14 cmH20-19 cmH20] 19 cmH20   Intake/Output Summary (Last 24 hours) at 12/16/2021 9476 Last data filed at 12/16/2021 0900 Gross per 24 hour  Intake 2207.67 ml  Output 175 ml  Net 2032.67 ml   Filed Weights   11/25/2021 1705 12/02/2021 1800  Weight: 56.7 kg 56.7 kg   Not currently sedated  General:  critically ill adult male on MV  HEENT: MM pink/moist, pupils 3/sluggish, ETT/ OGT, eeg leads in place, mild right orbital swelling, swelling and bruising to left side of face  Neuro:  does not open eyes or move to noxious stimuli but will lightly squeeze right hand and wiggle toes in both feet.  No movement in LUE CV: ST, no murmur, pulses +1-2, bruising to left chest wall/ lateral abd area  PULM:  non labored, overbreathing MV in 30's, coarse throughout, on wheeze GI: soft, +bs, condom cath  Extremities: warm/dry, more RUE edema with generalized edema   Skin: dressings to left thigh, left abd   UOP 150 ml/ 12 hrs  Net +1.9L  Tmax  99.5  Labs Na 152 (on HTS), k 4.3, CL 114, bicarb 20, BUN 209, sCr 6.88, AG 18, iCa 1.06, phos 6.4, Mag 3.4, AST/ ALT 93/57, ammonia 43, CK 1079> 992, WBC 19.7, Hgb 13.5  Resolved Hospital Problem list     Assessment & Plan:   Acute R basal ganglia and L basal ganglia ICH Acute/subacute L thalamic infarct Encephalopathy - ddx toxic/ metabolic/ uremic/ r/o SCSE/ ETOH withdrawal  Hx of polysubstance and ETOH abuse - appreciate neurology following  - UDS positive for amphetamines  - CTH> stable BG hemorrhage overnight with less prominent MLS  - HTS per Neurology,  Na currently 152.  Stopping HTS 2/1.  Continue to trend q 6 hrs  - cEEG with no evidence of seizure activity- neuro planning to stop today - further MRI/ MRA today per neuro - serial neuro exams - SBP goals < 140 - Maintain neuro protective measures; goal for eurothermia, euglycemia, eunatermia, normoxia, and PCO2 goal of 35-40 - starting nutrition and bowel regiment  - Seizure precautions  - Ammonia 43, repeat tomorrow - monitor for withdrawals - add empiric thiamine, folate - HIV neg, pending RPR, MMA, TSHb 0.717  Acute Hypoxic Respiratory Failure  Left Pneumonia ddx aspiration vs CAP, given evidence of new small LUL cavitation  -Continue MV support, 8cc/kg IBW with goal Pplat <30 and DP<15  -VAP prevention protocol/ PPI -PAD protocol for sedation> adding propofol and prn fentanyl with bowel regimen -wean FiO2 as able for SpO2 >92%  -daily SAT & SBT- not meeting criteria today, weaning FiO2 80> 60%, peep remains 8 -will discuss with Dr. Lynetta Mare, but likely needs bedside bronchoscopy and BAL and send for cultures vs trach aspirate - continue unasyn for presumed aspiration, adding vanc given cavitation.  MRSA PCR positive as well   Acute Kidney Injury AGMA Rhabdomyolysis, mild Hypernatremia- iatrogenic, see above  Baseline 1.6-1.7 Normal renal findings on CT abd/pelvis - sCr remains elevated 7.04> 6.88, BUN 224> 209 - iCa 1.06, will give 1gm calcium gluconate  - CK 1000> 992, recheck in am - continue MIVF for now - Nephrology consulted for further management-> starting CRRT 2/1 - trend renal panel q12  Multiple Pressure wounds - WOC following > hydrotherapy ordered   Elevated LFTs - trend LFTs in am  - no focal abnormalities on CT, consider RUQ [place] if worsening LFTs  Leukocytosis  - continue uasyn, adding vanc for now, deescalate based on culture data  - follow blood, urine, and send trach asp vs BAL  - trend WBC/ fever curve  Hyperglycemia - likely stress response -  monitor Q4, add SSI prn if > 180  Best Practice (right click and "Reselect all SmartList Selections" daily)   Diet/type: NPO, starting TF 2/1 DVT prophylaxis: SCD GI prophylaxis: PPI Lines: N/A s/p R IJ trialysis temp HD cath Foley:  Yes, and it is still needed Code Status:  full code Last date of multidisciplinary goals of care discussion [no family available for contact].  Apparently has lived in hotel room for 5+ years and prior notes report living conditions very poor (roof caving in, bed bugs, snakes in room).  No known NOK.  Will consult TOC to help with these issues, dispo will depend on hospital course  Labs   CBC: Recent Labs  Lab 11/15/2021 1616 11/28/2021 1627 12/16/21 0346 12/16/21 0753  WBC 12.7*  --  19.7*  --   HGB 16.5 17.3* 13.5 11.6*  HCT 50.3 51.0 39.7 34.0*  MCV 83.4  --  80.0  --   PLT 238  --  175  --     Basic Metabolic Panel: Recent Labs  Lab 12/02/2021 1616 12/09/2021 1627 12/07/2021 2152 12/16/21 0346 12/16/21 0753  NA 148* 146* 147* 152* 149*  K 4.7 4.7  --  4.3 4.1  CL 111 115*  --  114*  --   CO2 15*  --   --  20*  --   GLUCOSE 162* 158*  --  145*  --   BUN 224* >130*  --  209*  --   CREATININE 7.04* 7.20*  --  6.88*  --   CALCIUM 9.0  --   --  7.5*  --   MG  --   --   --  3.4*  --   PHOS  --   --   --  6.4*  --    GFR: Estimated Creatinine Clearance: 8.7 mL/min (A) (by C-G formula based on SCr of 6.88 mg/dL (H)). Recent Labs  Lab 11/19/2021 1616 12/11/2021 1926 12/16/21 0346  WBC 12.7*  --  19.7*  LATICACIDVEN 6.6* 3.5*  --     Liver Function Tests: Recent Labs  Lab 11/21/2021 1616 12/16/21 0346  AST 46* 93*  ALT 22 57*  ALKPHOS 53 38  BILITOT 1.5* 1.2  PROT 6.9 5.1*  ALBUMIN 2.3* 1.7*   No results for input(s): LIPASE, AMYLASE in the last 168 hours. Recent Labs  Lab 12/16/21 0346  AMMONIA 43*    ABG    Component Value Date/Time   PHART 7.480 (H) 12/16/2021 0753   PCO2ART 26.4 (L) 12/16/2021 0753   PO2ART 77 (L) 12/16/2021  0753   HCO3 19.7 (L) 12/16/2021 0753   TCO2 21 (L) 12/16/2021 0753   ACIDBASEDEF 3.0 (H) 12/16/2021 0753   O2SAT 97.0 12/16/2021 0753     Coagulation Profile: Recent Labs  Lab 12/05/2021 1616  INR 1.4*    Cardiac Enzymes: Recent Labs  Lab 11/15/2021 1616 12/16/21 0346  CKTOTAL 1,079* 992*  CKMB 21.1*  --     HbA1C: No results found for: HGBA1C  CBG: Recent Labs  Lab 11/24/2021 1911 12/16/21 0001 12/16/21 0318 12/16/21 0727  GLUCAP 127* 134* 138* 127*    Critical care time:   45 minutes    Kennieth Rad, ACNP [facility] 12/16/2021, 9:05 AM  See Amion for pager If no response to pager, please call PCCM consult pager After 7:00 pm call [place]

## 2021-12-16 NOTE — Progress Notes (Signed)
Hypertonic saline stopped at 1107 for Na+ of 155 (per MD at bedside). CRRT initiated around 1230. Nephrologist at bedside requested to keep him even for now. TF started at 37ml per dietitian request and increase by 10 q8hrs. Dailey Alberson, Rande Brunt, RN

## 2021-12-16 NOTE — Progress Notes (Addendum)
STROKE TEAM PROGRESS NOTE   INTERVAL HISTORY Patient is seen in his room with no family at the bedside.  Yesterday, he was found down in a hotel room between the bed and the wall.  He was intubated on arrival to the ED and found to have a pressure injury to the left hip.  On CT, he was found to have a 4cm acute right basal ganglia hemorrhage with edema and 38mm midline shift, along with smaller hemorrhages in the left basal ganglia, right thalamus and right temporal lobe.  He was also found to have an AKI with Cr of 7.04 and CK of 1079.  With rhabdomyolysis.  Plan is to consider dialysis today.  Follow-up CT scan shows stable appearance of the hemorrhage and right to left midline shift and herniation  Vitals:   12/16/21 1100 12/16/21 1139 12/16/21 1200 12/16/21 1300  BP: 104/74  124/84 103/76  Pulse: (!) 109  (!) 115 (!) 108  Resp: (!) 25  (!) 30 (!) 28  Temp:  100.2 F (37.9 C)    TempSrc:  Axillary    SpO2: 96%  97% 96%  Weight:      Height:       CBC:  Recent Labs  Lab 12/05/2021 1616 11/27/2021 1627 12/16/21 0346 12/16/21 0753  WBC 12.7*  --  19.7*  --   HGB 16.5   < > 13.5 11.6*  HCT 50.3   < > 39.7 34.0*  MCV 83.4  --  80.0  --   PLT 238  --  175  --    < > = values in this interval not displayed.   Basic Metabolic Panel:  Recent Labs  Lab 11/30/2021 1616 12/14/2021 1627 12/08/2021 2152 12/16/21 0346 12/16/21 0753 12/16/21 0943  NA 148* 146*   < > 152* 149* 155*  K 4.7 4.7  --  4.3 4.1  --   CL 111 115*  --  114*  --   --   CO2 15*  --   --  20*  --   --   GLUCOSE 162* 158*  --  145*  --   --   BUN 224* >130*  --  209*  --   --   CREATININE 7.04* 7.20*  --  6.88*  --   --   CALCIUM 9.0  --   --  7.5*  --   --   MG  --   --   --  3.4*  --   --   PHOS  --   --   --  6.4*  --   --    < > = values in this interval not displayed.   Lipid Panel:  Recent Labs  Lab 12/16/21 0346  CHOL 110  TRIG 118   122  HDL 31*  CHOLHDL 3.5  VLDL 24  LDLCALC 55   HgbA1c: No results  for input(s): HGBA1C in the last 168 hours. Urine Drug Screen:  Recent Labs  Lab 12/16/21 0026  LABOPIA NONE DETECTED  COCAINSCRNUR NONE DETECTED  LABBENZ NONE DETECTED  AMPHETMU POSITIVE*  THCU NONE DETECTED  LABBARB NONE DETECTED    Alcohol Level  Recent Labs  Lab 12/11/2021 1616  ETH <10    IMAGING past 24 hours DG Forearm Left  Result Date: 12/11/2021 CLINICAL DATA:  [place], swelling EXAM: LEFT FOREARM - 2 VIEW COMPARISON:  None. FINDINGS: Frontal and lateral views of the left forearm are obtained. No fracture, subluxation, or dislocation. Joint  spaces appear well preserved. There is diffuse soft tissue edema. IMPRESSION: 1. Diffuse soft tissue swelling. 2. No acute fracture. Electronically Signed   By: Randa Ngo M.D.   On: 11/15/2021 17:16   CT HEAD WO CONTRAST  Result Date: 12/11/2021 CLINICAL DATA:  Follow-up parenchymal hemorrhage EXAM: CT HEAD WITHOUT CONTRAST TECHNIQUE: Contiguous axial images were obtained from the base of the skull through the vertex without intravenous contrast. RADIATION DOSE REDUCTION: This exam was performed according to the departmental dose-optimization program which includes automated exposure control, adjustment of the mA and/or kV according to patient size and/or use of iterative reconstruction technique. COMPARISON:  CT from earlier in the same day. FINDINGS: Brain: Stable appearing hemorrhage is noted in the region of the right basal ganglia measuring up to 4.1 cm in greatest dimension. Some surrounding edema is again identified. Very minimal midline shift is noted from right to left which appears slightly less than that seen on the prior exam. The previously seen left basal ganglia and right thalamic hemorrhages are less well visualized than on the previous study. The left temporal parenchymal hemorrhage is stable. Cerebellar infarcts are noted worse on the left than the right similar to that noted on the prior exam. No new infarct is seen.  Vascular: No hyperdense vessel or unexpected calcification. Skull: Normal. Negative for fracture or focal lesion. Sinuses/Orbits: Small air-fluid level is again noted in the left maxillary antrum. Mild mucosal thickening is noted in the ethmoid sinuses. Other: None IMPRESSION: Stable appearing right basal ganglia hemorrhage when compared with the prior exam. Surrounding edema and mild midline shift from right to left is noted although the midline shift appears less prominent than on the prior exam. Previously seen left basal ganglia and right thalamic foci of hemorrhage are less well appreciated on this exam. The left temporal parenchymal hemorrhage is stable. Electronically Signed   By: Inez Catalina M.D.   On: 12/09/2021 23:55   CT HEAD WO CONTRAST (5MM)  Result Date: 12/09/2021 CLINICAL DATA:  Level 1 trauma.  Found unresponsive. EXAM: CT HEAD WITHOUT CONTRAST CT MAXILLOFACIAL WITHOUT CONTRAST CT CERVICAL SPINE WITHOUT CONTRAST TECHNIQUE: Multidetector CT imaging of the head, cervical spine, and maxillofacial structures were performed using the standard protocol without intravenous contrast. Multiplanar CT image reconstructions of the cervical spine and maxillofacial structures were also generated. RADIATION DOSE REDUCTION: This exam was performed according to the departmental dose-optimization program which includes automated exposure control, adjustment of the mA and/or kV according to patient size and/or use of iterative reconstruction technique. COMPARISON:  Head CT 03/17/2017 FINDINGS: CT HEAD FINDINGS Brain: An acute hemorrhage involving the right lentiform nucleus/external capsule measures 4.1 x 2.4 x 3.2 cm (approximate volume 16 mL). There is mild surrounding edema with mass effect on the right lateral ventricle and 4 mm of leftward midline shift. A smaller acute hemorrhage anteriorly in the left basal ganglia/anterior limb of left internal capsule measures 1.5 cm with minimal surrounding edema. A  small acute right thalamic hemorrhage measures 5 mm. There is also a 4 mm acute hemorrhage anteriorly in the left temporal lobe without significant edema. No acute cortically based infarct or extra-axial fluid collection is identified. There is no hydrocephalus. There are chronic left larger than right cerebellar infarcts. Left thalamic lacunar infarcts are new and have a more chronic appearance anteriorly and more acute to subacute appearance posteriorly. Patchy hypodensities in the cerebral white matter bilaterally are nonspecific but compatible with mild-to-moderate chronic small vessel ischemic disease. Vascular: No hyperdense vessel.  Skull: No acute calvarial fracture.  Left frontal scalp swelling. Other: None. CT MAXILLOFACIAL FINDINGS Osseous: No acute fracture or mandibular dislocation. Remote blowout fracture involving the medial wall and floor of the left orbit, also present in 2018. Multiple missing teeth. Periapical lucency involving the left mandibular central incisor with evidence of prior root canal. Orbits: Left periorbital soft tissue swelling. Grossly intact globes. No retrobulbar hematoma. Sinuses: Opacification of a hypoplastic left frontal sinus and of multiple left ethmoid air cells. Mucosal thickening and small volume fluid in the left maxillary sinus. Clear mastoid air cells. Soft tissues: Left facial soft tissue swelling diffusely. CT CERVICAL SPINE FINDINGS Alignment: Cervical spine straightening.  No listhesis. Skull base and vertebrae: No acute fracture or suspicious osseous lesion. Soft tissues and spinal canal: No prevertebral fluid or swelling. No visible canal hematoma. Disc levels: Mild-to-moderate cervical spondylosis. Moderate to severe right neural foraminal stenosis at C3-4 due to asymmetric uncovertebral spurring. Upper chest: Reported separately. Other: Partially visualized endotracheal and enteric tubes. IMPRESSION: 1. 4 cm acute right basal ganglia hemorrhage with mild edema  and 4 mm of leftward midline shift. 2. Smaller acute hemorrhages in the left basal ganglia, right thalamus, and left temporal lobe. 3. Suspected acute/subacute left thalamic infarct. 4. Chronic cerebellar infarcts. 5. Left-sided scalp and facial soft tissue swelling. 6. No acute maxillofacial or cervical spine fracture. 7. Remote left orbital fracture. Critical Value/emergent results were reviewed in person with Dr. Georganna Skeans on 11/16/2021 at 4:50 p.m. Electronically Signed   By: Logan Bores M.D.   On: 12/02/2021 17:15   CT CERVICAL SPINE WO CONTRAST  Result Date: 12/07/2021 CLINICAL DATA:  Level 1 trauma.  Found unresponsive. EXAM: CT HEAD WITHOUT CONTRAST CT MAXILLOFACIAL WITHOUT CONTRAST CT CERVICAL SPINE WITHOUT CONTRAST TECHNIQUE: Multidetector CT imaging of the head, cervical spine, and maxillofacial structures were performed using the standard protocol without intravenous contrast. Multiplanar CT image reconstructions of the cervical spine and maxillofacial structures were also generated. RADIATION DOSE REDUCTION: This exam was performed according to the departmental dose-optimization program which includes automated exposure control, adjustment of the mA and/or kV according to patient size and/or use of iterative reconstruction technique. COMPARISON:  Head CT 03/17/2017 FINDINGS: CT HEAD FINDINGS Brain: An acute hemorrhage involving the right lentiform nucleus/external capsule measures 4.1 x 2.4 x 3.2 cm (approximate volume 16 mL). There is mild surrounding edema with mass effect on the right lateral ventricle and 4 mm of leftward midline shift. A smaller acute hemorrhage anteriorly in the left basal ganglia/anterior limb of left internal capsule measures 1.5 cm with minimal surrounding edema. A small acute right thalamic hemorrhage measures 5 mm. There is also a 4 mm acute hemorrhage anteriorly in the left temporal lobe without significant edema. No acute cortically based infarct or extra-axial  fluid collection is identified. There is no hydrocephalus. There are chronic left larger than right cerebellar infarcts. Left thalamic lacunar infarcts are new and have a more chronic appearance anteriorly and more acute to subacute appearance posteriorly. Patchy hypodensities in the cerebral white matter bilaterally are nonspecific but compatible with mild-to-moderate chronic small vessel ischemic disease. Vascular: No hyperdense vessel. Skull: No acute calvarial fracture.  Left frontal scalp swelling. Other: None. CT MAXILLOFACIAL FINDINGS Osseous: No acute fracture or mandibular dislocation. Remote blowout fracture involving the medial wall and floor of the left orbit, also present in 2018. Multiple missing teeth. Periapical lucency involving the left mandibular central incisor with evidence of prior root canal. Orbits: Left periorbital soft tissue swelling. Grossly  intact globes. No retrobulbar hematoma. Sinuses: Opacification of a hypoplastic left frontal sinus and of multiple left ethmoid air cells. Mucosal thickening and small volume fluid in the left maxillary sinus. Clear mastoid air cells. Soft tissues: Left facial soft tissue swelling diffusely. CT CERVICAL SPINE FINDINGS Alignment: Cervical spine straightening.  No listhesis. Skull base and vertebrae: No acute fracture or suspicious osseous lesion. Soft tissues and spinal canal: No prevertebral fluid or swelling. No visible canal hematoma. Disc levels: Mild-to-moderate cervical spondylosis. Moderate to severe right neural foraminal stenosis at C3-4 due to asymmetric uncovertebral spurring. Upper chest: Reported separately. Other: Partially visualized endotracheal and enteric tubes. IMPRESSION: 1. 4 cm acute right basal ganglia hemorrhage with mild edema and 4 mm of leftward midline shift. 2. Smaller acute hemorrhages in the left basal ganglia, right thalamus, and left temporal lobe. 3. Suspected acute/subacute left thalamic infarct. 4. Chronic cerebellar  infarcts. 5. Left-sided scalp and facial soft tissue swelling. 6. No acute maxillofacial or cervical spine fracture. 7. Remote left orbital fracture. Critical Value/emergent results were reviewed in person with Dr. Georganna Skeans on 12/10/2021 at 4:50 p.m. Electronically Signed   By: Logan Bores M.D.   On: 11/29/2021 17:15   DG Pelvis Portable  Result Date: 11/21/2021 CLINICAL DATA:  Trauma, fall. EXAM: PORTABLE PELVIS 1-2 VIEWS COMPARISON:  None. FINDINGS: Right hip is internally rotated which limits evaluation. There is no evidence of pelvic fracture or diastasis. No pelvic bone lesions are seen. IVC filter is present. IMPRESSION: Negative. Electronically Signed   By: Ronney Asters M.D.   On: 12/03/2021 17:17   DG CHEST PORT 1 VIEW  Result Date: 12/16/2021 CLINICAL DATA:  Central line placement EXAM: PORTABLE CHEST 1 VIEW COMPARISON:  12/12/2021 FINDINGS: New right IJ central line tip overlies SVC. Endotracheal and enteric tubes are again identified. Persistent left lung opacities with improved aeration in the upper lung and worsening aeration at the lung base compared to the prior study. No significant pleural effusion. No pneumothorax. Stable cardiomediastinal contours. IMPRESSION: New right IJ central line tip overlies SVC.  No pneumothorax. Left lung opacities. Improved aeration in the upper lung and worsening aeration at the lung base. Electronically Signed   By: Macy Mis M.D.   On: 12/16/2021 11:55   DG Chest Port 1 View  Result Date: 11/20/2021 CLINICAL DATA:  Level 1 trauma, overdose EXAM: PORTABLE CHEST 1 VIEW COMPARISON:  05/23/2021 FINDINGS: Endotracheal tube with the tip 4.7 cm above the carina. Nasogastric tube coursing below the diaphragm. Right lung is clear. Patchy airspace disease throughout the left lung which may reflect multilobar pneumonia versus aspiration pneumonia versus pulmonary contusions. No pneumothorax. Stable cardiomediastinal silhouette. No acute osseous  abnormality. IMPRESSION: 1. Endotracheal tube with the tip 4.7 cm above the carina. 2. Nasogastric tube coursing below the diaphragm with the tip excluded from the field of view. 3. Patchy airspace disease throughout the left lung which may reflect multilobar pneumonia versus aspiration pneumonia versus pulmonary contusions. Electronically Signed   By: Kathreen Devoid M.D.   On: 12/10/2021 16:32   DG Humerus Left  Result Date: 11/30/2021 CLINICAL DATA:  [place], swelling EXAM: LEFT HUMERUS - 2+ VIEW COMPARISON:  None. FINDINGS: Frontal and lateral views of the left humerus are obtained. No acute fracture. Alignment of the left shoulder and elbow is anatomic. Soft tissues are unremarkable. Visualized portions of the left chest demonstrate stable airspace disease. IMPRESSION: 1. Unremarkable left humerus. 2. Stable left-sided airspace disease. Electronically Signed   By: Randa Ngo  M.D.   On: 11/15/2021 17:14   Overnight EEG with video  Result Date: 12/16/2021 Lora Havens, MD     12/16/2021 12:50 PM Patient Name: Darryl Parsons MRN: [id number] Epilepsy Attending: Lora Havens Referring Physician/Provider: Donnetta Simpers, MD Duration: 12/16/2021 0322 to 1155 Patient history: 66 year old male with multiple subcortical ICH with altered mental status.  EEG evaluate for seizure. Level of alertness: comatose AEDs during EEG study: None Technical aspects: This EEG study was done with scalp electrodes positioned according to the 10-20 International system of electrode placement. Electrical activity was acquired at a sampling rate of 500Hz  and reviewed with a high frequency filter of 70Hz  and a Parsons frequency filter of 1Hz . EEG data were recorded continuously and digitally stored. Description: EEG showed continuous generalized and lateralized right hemisphere 3 to 7 Hz theta-delta slowing. Hyperventilation and photic stimulation were not performed.   ABNORMALITY - Continuous slow, generalized and lateralized  right hemisphere IMPRESSION: This study is suggestive of cortical dysfunction arising from right hemisphere likely secondary to underlying structural abnormality/bleed.  Additionally there is moderate to severe diffuse encephalopathy, nonspecific to etiology.  No seizures or epileptiform discharges were seen throughout the recording. Lora Havens   ECHOCARDIOGRAM COMPLETE  Result Date: 12/16/2021    ECHOCARDIOGRAM REPORT   Patient Name:   Darryl Parsons Date of Exam: 12/16/2021 Medical Rec #:  [id number]        Height:       70.0 in Accession #:    [id number]       Weight:       125.0 lb Date of Birth:  11-08-1956        BSA:          1.709 m Patient Age:    38 years         BP:           123/85 mmHg Patient Gender: M                HR:           110 bpm. Exam Location:  Inpatient Procedure: 2D Echo, Cardiac Doppler, Color Doppler and Strain Analysis Indications:    CVA  History:        Patient has no prior history of Echocardiogram examinations.  Sonographer:    [facility] Referring Phys: [id number] Kemmerer  1. Left ventricular ejection fraction, by estimation, is 60 to 65%. The left ventricle has normal function. The left ventricle has no regional wall motion abnormalities. There is severe concentric left ventricular hypertrophy. No evidence of increased LVOT gradient. Left ventricular diastolic parameters are consistent with Grade I diastolic dysfunction (impaired relaxation).  2. Right ventricular systolic function is normal. The right ventricular size is normal.  3. The mitral valve is normal in structure. No evidence of mitral valve regurgitation. No evidence of mitral stenosis.  4. The aortic valve is normal in structure. Aortic valve regurgitation is not visualized. No aortic stenosis is present.  5. The inferior vena cava is normal in size with greater than 50% respiratory variability, suggesting right atrial pressure of 3 mmHg. Comparison(s): No prior Echocardiogram. FINDINGS  Left Ventricle:  Left ventricular ejection fraction, by estimation, is 60 to 65%. The left ventricle has normal function. The left ventricle has no regional wall motion abnormalities. Global longitudinal strain performed but not reported based on interpreter judgement due to suboptimal tracking. The left ventricular internal cavity size was small. There is severe concentric left ventricular hypertrophy.  Left ventricular diastolic parameters are consistent with Grade I diastolic dysfunction (impaired relaxation). Right Ventricle: The right ventricular size is normal. No increase in right ventricular wall thickness. Right ventricular systolic function is normal. Left Atrium: Left atrial size was normal in size. Right Atrium: Right atrial size was normal in size. Pericardium: There is no evidence of pericardial effusion. Mitral Valve: The mitral valve is normal in structure. No evidence of mitral valve regurgitation. No evidence of mitral valve stenosis. Tricuspid Valve: The tricuspid valve is normal in structure. Tricuspid valve regurgitation is trivial. No evidence of tricuspid stenosis. Aortic Valve: The aortic valve is normal in structure. Aortic valve regurgitation is not visualized. No aortic stenosis is present. Aortic valve mean gradient measures 6.0 mmHg. Aortic valve peak gradient measures 13.1 mmHg. Aortic valve area, by VTI measures 3.75 cm. Pulmonic Valve: The pulmonic valve was normal in structure. Pulmonic valve regurgitation is not visualized. No evidence of pulmonic stenosis. Aorta: The aortic root is normal in size and structure. Venous: The inferior vena cava is normal in size with greater than 50% respiratory variability, suggesting right atrial pressure of 3 mmHg. IAS/Shunts: No atrial level shunt detected by color flow Doppler.  LEFT VENTRICLE PLAX 2D LVIDd:         3.30 cm     Diastology LVIDs:         1.90 cm     LV e' medial:    3.59 cm/s LV PW:         1.90 cm     LV E/e' medial:  15.2 LV IVS:        1.60 cm      LV e' lateral:   5.55 cm/s LVOT diam:     2.10 cm     LV E/e' lateral: 9.8 LV SV:         78 LV SV Index:   45 LVOT Area:     3.46 cm  LV Volumes (MOD) LV vol d, MOD A2C: 58.1 ml LV vol d, MOD A4C: 64.2 ml LV vol s, MOD A2C: 26.1 ml LV vol s, MOD A4C: 23.9 ml LV SV MOD A2C:     32.0 ml LV SV MOD A4C:     64.2 ml LV SV MOD BP:      35.9 ml RIGHT VENTRICLE RV Basal diam:  2.60 cm RV Mid diam:    1.80 cm RV S prime:     9.85 cm/s TAPSE (M-mode): 1.3 cm LEFT ATRIUM           Index        RIGHT ATRIUM           Index LA diam:      1.90 cm 1.11 cm/m   RA Area:     14.30 cm LA Vol (A2C): 21.5 ml 12.58 ml/m  RA Volume:   34.20 ml  20.01 ml/m LA Vol (A4C): 42.6 ml 24.92 ml/m  AORTIC VALVE                     PULMONIC VALVE AV Area (Vmax):    3.25 cm      PV Vmax:       1.24 m/s AV Area (Vmean):   3.37 cm      PV Vmean:      83.400 cm/s AV Area (VTI):     3.75 cm      PV VTI:        0.158 m AV Vmax:  181.00 cm/s   PV Peak grad:  6.2 mmHg AV Vmean:          109.000 cm/s  PV Mean grad:  3.0 mmHg AV VTI:            0.207 m AV Peak Grad:      13.1 mmHg AV Mean Grad:      6.0 mmHg LVOT Vmax:         170.00 cm/s LVOT Vmean:        106.000 cm/s LVOT VTI:          0.224 m LVOT/AV VTI ratio: 1.08  AORTA Ao Root diam: 3.20 cm Ao Asc diam:  3.50 cm MITRAL VALVE                TRICUSPID VALVE MV Area (PHT): 3.34 cm     TR Peak grad:   37.0 mmHg MV Decel Time: 227 msec     TR Vmax:        304.00 cm/s MV E velocity: 54.50 cm/s MV A velocity: 102.00 cm/s  SHUNTS MV E/A ratio:  0.53         Systemic VTI:  0.22 m                             Systemic Diam: 2.10 cm Kardie Tobb DO Electronically signed by Berniece Salines DO Signature Date/Time: 12/16/2021/12:42:24 PM    Final    CT CHEST ABDOMEN PELVIS WO CONTRAST  Result Date: 12/09/2021 CLINICAL DATA:  Level 1 trauma. Found unresponsive, lying left-side-down for prolonged period of time. EXAM: CT CHEST, ABDOMEN AND PELVIS WITHOUT CONTRAST TECHNIQUE: Multidetector CT imaging  of the chest, abdomen and pelvis was performed following the standard protocol without IV contrast. RADIATION DOSE REDUCTION: This exam was performed according to the departmental dose-optimization program which includes automated exposure control, adjustment of the mA and/or kV according to patient size and/or use of iterative reconstruction technique. COMPARISON:  Chest radiograph earlier today. CT abdomen and pelvis 09/06/2014. Chest CT 02/21/2010. FINDINGS: CT CHEST FINDINGS Cardiovascular: Limited assessment for acute vascular injury in the absence of IV contrast. Normal caliber of the thoracic aorta. Normal heart size. No pericardial effusion. Mediastinum/Nodes: No mediastinal hematoma. No enlarged axillary, mediastinal, or hilar lymph nodes identified within limitations of noncontrast technique. Unremarkable thyroid. Endotracheal tube terminating above the carina. Enteric tube coursing into the stomach. Lungs/Pleura: No pleural effusion or pneumothorax. Widespread areas of patchy consolidation throughout the left upper and lower lobes. 2.5 cm region of cavitation within an area of consolidation in the anterior left upper lobe. Bronchial wall thickening with opacification multiple small bronchi in the left upper and lower lobes. Clear right lung. Musculoskeletal: No acute fracture or suspicious osseous lesion. Bilateral glenohumeral arthropathy with calcified bodies in the right subcoracoid region. Partially visualized diffuse soft tissue edema involving the left arm. CT ABDOMEN PELVIS FINDINGS Hepatobiliary: No focal liver abnormality is seen on this unenhanced study. No perihepatic hematoma. Unremarkable gallbladder. No biliary dilatation. Pancreas: Unremarkable. Spleen: Unremarkable. Adrenals/Urinary Tract: Mild bilateral adrenal gland thickening. No evidence of an acute renal injury on this unenhanced study. No perinephric hematoma. Small hypodensities in both kidneys, incompletely characterized given  small size and absence of IV contrast though potentially reflecting cysts. No renal calculi or hydronephrosis. Unremarkable bladder. Stomach/Bowel: An enteric tube is looped in the proximal stomach with tip in the region of the gastric cardia. No bowel dilatation or bowel wall thickening is  evident. The appendix is unremarkable. Vascular/Lymphatic: Normal caliber of the abdominal aorta. IVC filter in place. No enlarged lymph nodes. Reproductive: Mildly enlarged prostate. Other: No intraperitoneal free fluid or free air. Unchanged small fat containing umbilical hernia. Musculoskeletal: No acute osseous abnormality or suspicious osseous lesion. Mild disc and moderately advanced facet degeneration in the lumbar spine. IMPRESSION: 1. Widespread patchy consolidation throughout the left lung suspicious for aspiration pneumonia. Small area of cavitation anteriorly in the left upper lobe. 2. No evidence of acute traumatic injury in the abdomen or pelvis. The study was reviewed in person with Dr. Georganna Skeans on 11/27/2021 at 4:50 p.m. Electronically Signed   By: Logan Bores M.D.   On: 12/13/2021 17:29   CT Maxillofacial Wo Contrast  Result Date: 12/05/2021 CLINICAL DATA:  Level 1 trauma.  Found unresponsive. EXAM: CT HEAD WITHOUT CONTRAST CT MAXILLOFACIAL WITHOUT CONTRAST CT CERVICAL SPINE WITHOUT CONTRAST TECHNIQUE: Multidetector CT imaging of the head, cervical spine, and maxillofacial structures were performed using the standard protocol without intravenous contrast. Multiplanar CT image reconstructions of the cervical spine and maxillofacial structures were also generated. RADIATION DOSE REDUCTION: This exam was performed according to the departmental dose-optimization program which includes automated exposure control, adjustment of the mA and/or kV according to patient size and/or use of iterative reconstruction technique. COMPARISON:  Head CT 03/17/2017 FINDINGS: CT HEAD FINDINGS Brain: An acute hemorrhage  involving the right lentiform nucleus/external capsule measures 4.1 x 2.4 x 3.2 cm (approximate volume 16 mL). There is mild surrounding edema with mass effect on the right lateral ventricle and 4 mm of leftward midline shift. A smaller acute hemorrhage anteriorly in the left basal ganglia/anterior limb of left internal capsule measures 1.5 cm with minimal surrounding edema. A small acute right thalamic hemorrhage measures 5 mm. There is also a 4 mm acute hemorrhage anteriorly in the left temporal lobe without significant edema. No acute cortically based infarct or extra-axial fluid collection is identified. There is no hydrocephalus. There are chronic left larger than right cerebellar infarcts. Left thalamic lacunar infarcts are new and have a more chronic appearance anteriorly and more acute to subacute appearance posteriorly. Patchy hypodensities in the cerebral white matter bilaterally are nonspecific but compatible with mild-to-moderate chronic small vessel ischemic disease. Vascular: No hyperdense vessel. Skull: No acute calvarial fracture.  Left frontal scalp swelling. Other: None. CT MAXILLOFACIAL FINDINGS Osseous: No acute fracture or mandibular dislocation. Remote blowout fracture involving the medial wall and floor of the left orbit, also present in 2018. Multiple missing teeth. Periapical lucency involving the left mandibular central incisor with evidence of prior root canal. Orbits: Left periorbital soft tissue swelling. Grossly intact globes. No retrobulbar hematoma. Sinuses: Opacification of a hypoplastic left frontal sinus and of multiple left ethmoid air cells. Mucosal thickening and small volume fluid in the left maxillary sinus. Clear mastoid air cells. Soft tissues: Left facial soft tissue swelling diffusely. CT CERVICAL SPINE FINDINGS Alignment: Cervical spine straightening.  No listhesis. Skull base and vertebrae: No acute fracture or suspicious osseous lesion. Soft tissues and spinal canal: No  prevertebral fluid or swelling. No visible canal hematoma. Disc levels: Mild-to-moderate cervical spondylosis. Moderate to severe right neural foraminal stenosis at C3-4 due to asymmetric uncovertebral spurring. Upper chest: Reported separately. Other: Partially visualized endotracheal and enteric tubes. IMPRESSION: 1. 4 cm acute right basal ganglia hemorrhage with mild edema and 4 mm of leftward midline shift. 2. Smaller acute hemorrhages in the left basal ganglia, right thalamus, and left temporal lobe. 3. Suspected acute/subacute  left thalamic infarct. 4. Chronic cerebellar infarcts. 5. Left-sided scalp and facial soft tissue swelling. 6. No acute maxillofacial or cervical spine fracture. 7. Remote left orbital fracture. Critical Value/emergent results were reviewed in person with Dr. Georganna Skeans on 11/24/2021 at 4:50 p.m. Electronically Signed   By: Logan Bores M.D.   On: 11/27/2021 17:15    PHYSICAL EXAM General: Intubated, sedated well-developed, well-nourished male in no acute distress  Respiratory:  Respirations synchronous with ventilator  Neurological: Intubated and sedated.  Eyes are closed.  PERRL, cough reflex intact.  Will follow commands with RUE and RLE, no movement of LUE, will withdraw LLE to noxious stimuli.  ASSESSMENT/PLAN Darryl Parsons is a 66 y.o. male with history of schizophrenia, stroke, cocaine use, CKD, HTN and smoking presenting after being found down in a hotel room between the bed and the wall.  He was intubated on arrival to the ED and found to have a pressure injury to the left hip.  On CT, he was found to have a 4cm acute right basal ganglia hemorrhage with edema and 29mm midline shift, along with smaller hemorrhages in the left basal ganglia, right thalamus and right temporal lobe.  He was also found to have an AKI with Cr of 7.04 and CK of 1079.  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 VTE prophylaxis - SCDs    Diet   Diet NPO time specified   No antithrombotic prior to admission, now on No antithrombotic secondary to IPH Therapy recommendations:  pending Disposition:  pending  Cerebral edema with midline shift 19mm right to left midline shift noted on CT 3% HTS given overnight but stopped due to Na of 155 Midline shift less prominent on follow up CT  Hypertension Home meds:  amlodipine 10 mg daily Stable Keep SBP <140 Long-term BP goal normotensive  Hyperlipidemia Home meds:  none LDL 55, goal < 70 High intensity statin not indicated as LDL below goal Continue statin at discharge  Risk for Diabetes type II  Home meds:  none HgbA1c No results found for requested labs within last 26280 hours., goal < 7.0 CBGs Recent Labs    12/16/21 0318 12/16/21 0727 12/16/21 1132  GLUCAP 138* 127* 120*    SSI  Respiratory failure Intubated in ED due to GCS of 3 Ventilator management per CCM  AKI on CKD Cr 6.88 CRRT per CCM Avoid contrast when possible Renally dose medications as appropriate  Other Stroke Risk Factors Cigarette smoker advised to stop smoking Substance abuse - UDS:  THC NONE DETECTED, Cocaine NONE DETECTED. Patient advised to stop using due to stroke risk. Hx stroke  Other Active Problems Schizophrenia   Hospital day # [place] , MSN, AGACNP-BC [facility] See  Amion for schedule and pager information 12/16/2021 1:33 PM   I have personally obtained history,examined this patient, reviewed notes, independently viewed imaging studies, participated  in medical decision making and plan of care.ROS completed by me personally and pertinent positives fully documented  I have made any additions or clarifications directly to the above note. Agree with note above.  Patient was found unresponsive at home with pressure injury and rhabdomyolysis and renal failure with CT scan showing moderate right basal ganglia hemorrhage with mild cytotoxic edema with right-to-left midline shift and brain herniation.  Continue hypertonic saline with serum sodium goal 150-155 and strict blood pressure control with systolic goal 630-160 for the first 24 hours and then below 160.  Use as needed IV labetalol and hydralazine as needed Cleviprex drip.  MRI scan of the brain with MRI of the brain later today pending hemodynamically stable and can travel off the floor.  Discontinue long-term EEG monitoring as no seizures noted overnight.  Management of renal failure dialysis and respiratory failure Hospital CCM team.  No family available at the bedside for discussion.  Discussed with Dr. Lynetta Mare critical care medicine. This patient is critically ill and at significant risk of neurological worsening, death and care requires constant monitoring of vital signs, hemodynamics,respiratory and cardiac monitoring, extensive review of multiple databases, frequent neurological assessment, discussion with family, other specialists and medical decision making of high complexity.I have made any additions or clarifications directly to the above note.This critical care time does not reflect procedure time, or teaching time or supervisory time of PA/NP/Med Resident etc but could involve care discussion time.  I spent 30 minutes of neurocritical care time  in the care of  this patient.     Antony Contras, MD Medical  Director [facility] Stroke Center Pager: [phone] 12/16/2021 2:56 PM   To contact Stroke Continuity provider, please refer to [email or web address] After hours, contact General Neurology

## 2021-12-16 NOTE — Progress Notes (Signed)
Patient transported from 4N21 to CT and back with no complications.

## 2021-12-16 NOTE — Progress Notes (Signed)
During CRRT machine read, "MALFUNCTION: Communication Error," "Due to: No communication with the protective task." Machine had to be shut down and blood could not be returned. Help desk called for machine and they walked [place] through how to disconnect the cartridge. Pt's line heparin locked until we can find another machine. Nandana Krolikowski, Rande Brunt, RN

## 2021-12-16 NOTE — Procedures (Signed)
Central Venous Catheter Insertion Procedure Note  Darryl Parsons  [id number]  24-Aug-1956  Date:12/16/21  Time:3:23 PM   Provider Performing:Eulamae Greenstein   Procedure: Insertion of Non-tunneled Central Venous Catheter(36556)with US guidance (31594)    Indication(s) Hemodialysis  Consent Unable to obtain consent due to inability to find a medical decision maker for patient.  All reasonable efforts were made.  Another independent medical provider, Dr Leonie Man , confirmed the benefits of this procedure outweigh the risks.  Anesthesia Topical only with 1% lidocaine   Timeout Verified patient identification, verified procedure, site/side was marked, verified correct patient position, special equipment/implants available, medications/allergies/relevant history reviewed, required imaging and test results available.  Sterile Technique Maximal sterile technique including full sterile barrier drape, hand hygiene, sterile gown, sterile gloves, mask, hair covering, sterile ultrasound probe cover (if used).  Procedure Description Area of catheter insertion was cleaned with chlorhexidine and draped in sterile fashion.   With real-time ultrasound guidance a HD catheter was placed into the right internal jugular vein.  Nonpulsatile blood flow and easy flushing noted in all ports.  The catheter was sutured in place and sterile dressing applied.  Complications/Tolerance None; patient tolerated the procedure well. Chest X-ray is ordered to verify placement for internal jugular or subclavian cannulation.  Chest x-ray is not ordered for femoral cannulation.  EBL Minimal  Specimen(s) None  [name], MD [facility] ICU Physician [place]  Pager: [phone] Or Epic Secure Chat After hours: [phone].  12/16/2021, 3:23 PM

## 2021-12-16 NOTE — Progress Notes (Addendum)
°  Transition of Care [facility]) Screening Note   Patient Details  Name: Darryl Parsons Date of Birth: February 27, 1956   Transition of Care [facility]) CM/SW Contact:    Benard Halsted, LCSW Phone Number: 12/16/2021, 12:17 PM    Transition of Care Department [facility]) has reviewed patient. Patient has resided at [facility] the past few years. Only contact identified from chart review is a significant other, Reggy Eye, at [employer] ([place]) [phone]) in 2018. We will continue to monitor patient advancement through interdisciplinary progression rounds.

## 2021-12-16 NOTE — Progress Notes (Signed)
LTM EEG discontinued - no skin breakdown at unhook.   

## 2021-12-16 NOTE — Progress Notes (Signed)
PT Cancellation Note  Patient Details Name: [name] MRN: [id number] DOB: May 28, 1956   Cancelled Treatment:    Reason Eval/Treat Not Completed: Active bedrest order. Pt on active bedrest until 1807 on 12/16/2021 per order set. PT will hold until pt is off bedrest and is medically appropriate to evaluate.   Zenaida Niece 12/16/2021, 7:55 AM

## 2021-12-16 NOTE — Progress Notes (Signed)
Carotid duplex bilateral study attempted. Patient in procedure. Will attempt again as schedule and patient availability permits.   Darlin Coco, RDMS, RVT

## 2021-12-16 NOTE — Consult Note (Signed)
Nephrology Consult   Requesting provider: [name] Service requesting consult: PCCM Reason for consult: acute on chronic kidney injury   Assessment/Recommendations: Darryl Parsons is a/an 66 y.o. male with a past medical history hypertension, CKD, cocaine use, methamphetamine use, HLD, and schizoaffective idsorder who present after being found unresponsive in his hotel room admitted to the ICU for bilateral basal ganglia hemorrhages, AKI, and aspiration pneumonia.   Anuric Acute on chronic kidney injury (unchanged): Unclear etiology due to difficult history. History of CKD baseline Cr around 1.8 6 months ago, may have had progression since then. Hypotension and dehydration likely to be contributing as patient was found down for unknown period of time. CT without hydronephrosis less likely obstructive.  -Chart reviewed: BP seems to have improved to fluids does not appear that he has been requiring pressors. Hypernatremia 2/2 hypertonic saline, lactic acidosis improving, mild elevation in CK, K wnl -Anuric with 139m urine output yesterday and about 516mthis morning, bladder scan 0 this morning -Cannot rule out uremia given elevation in BUN >200 -Start CRRT given anuria, minimal improvement in mental status, will try to avoid osmotic shifts that may worsen cerebral edema and mental status -Continue to monitor daily Cr, Dose meds for GFR -Monitor Daily I/Os, Daily weight, bladder scan q shift -Maintain MAP>65 for optimal renal perfusion.  -Avoid nephrotoxic medications including NSAIDs and Vanc/Zosyn combo  Volume Status: Appears euvolemic on exam. Based on our examination and review of available imaging, plan to start CRRT mainly for uremia, will continue to reassess volume status.  Hypertension: Amlodipine and HCTZ as outpatient unclear if he has been taking this. Hypotensive on admission likely due to dehydration. Has been normotensive after IV fluids, BP goal 120-140 for ICH.   ICH:  Right basal ganglia with midline shift and smaller left basal ganglia hemorrhage, right thalamus, and right temporal lobe. Stable on repeat CT on 1/31. MRI, MRA, carotid doppler pending medical stability. Stated on hypertonic saline for cerebral edema overnight but stopped due to hypernatremia. Management per neurology.  Acute hypoxic respiratory failure: Left sided opacities with cavitation aspiration vs CAP  - Intubated, vent management per PCCM  - Currently on vanc and unasyn  Recommendations conveyed to primary service.    JeIona BeardM PGY-2  12/16/2021 12:58 PM   _____________________________________________________________________________________ CC: altered mental status  History of Present Illness: Darryl Parsons a/an [age].o. male with a past medical history of prior CVA, CKD stage III, cocaine abuse, hypertension, and schizoaffective who presents after being found down in his motel room. Per EMS notes last seen well 2 days prior to admission. Per chart review he was found laying between bed and wall on the left side not to have left eye swelling, with pressure wound on the left side, pinpoint pupils and rightward gaze preference. EMS administered narcan without improvement. Noted to be hypotensive and tachycardic was intubated on arrival. [facility] testing positive for COVID in the ED. Appears to thave presented but not seen in the ED in August 2022 for bilateral LE edema. Subsequently lost to follow up. Patient was evaluated by trauma and to have acute basal ganglia hemorrhage with mild edema and midline shift, smaller left basal ganglia hemorrage, and acute/subacute L thalamic infarcts. Additionally found to have AKI, lactic acidosis and pneumonia [facility] Nephrology consulted to acute on chronic kidney injury and CRRT.  Medications:   Current Facility-Administered Medications  Medication Dose Route Frequency Provider Last Rate Last Admin    prismasol BGK 4/2.5 infusion   CRRT Continuous Reesa Chew, MD 300 mL/hr at 12/16/21 1150 New Bag at 12/16/21 [place] 4/2.5 infusion   CRRT Continuous Reesa Chew, MD 300 mL/hr at 12/16/21 1150 New Bag at 12/16/21 1150    stroke: mapping our early stages of recovery book   Does not apply Once Jacky Kindle, MD       0.9 %  sodium chloride infusion   Intravenous Continuous Gleason, Otilio Carpen, PA-C       acetaminophen (TYLENOL) tablet 650 mg  650 mg Per Tube Q4H PRN Jacky Kindle, MD   650 mg at 12/16/21 1149   Or   acetaminophen (TYLENOL) 160 MG/5ML solution 650 mg  650 mg Per Tube Q4H PRN Jacky Kindle, MD       Or   acetaminophen (TYLENOL) suppository 650 mg  650 mg Rectal Q4H PRN Jacky Kindle, MD       Ampicillin-Sulbactam (UNASYN) 3 g in sodium chloride 0.9 % 100 mL IVPB  3 g Intravenous Q8H Dang, Thuy D, RPH       chlorhexidine gluconate (MEDLINE KIT) (PERIDEX) 0.12 % solution 15 mL  15 mL Mouth Rinse BID [name], MD   15 mL at 12/16/21 5643   Chlorhexidine Gluconate Cloth 2 % PADS 6 each  6 each Topical Daily Anders Simmonds, MD   6 each at 12/16/21 3295   clevidipine (CLEVIPREX) infusion 0.5 mg/mL  0-21 mg/hr Intravenous Continuous Jacky Kindle, MD       collagenase (SANTYL) ointment   Topical Daily Jacky Kindle, MD   1 application at 18/84/16 0951   feeding supplement (PROSource TF) liquid 45 mL  45 mL Per Tube TID [name], MD   45 mL at 12/16/21 1150   feeding supplement (VITAL 1.5 CAL) liquid 1,000 mL  1,000 mL Per Tube Continuous [name], MD 25 mL/hr at 12/16/21 1115 1,000 mL at 12/16/21 1115   fentaNYL (SUBLIMAZE) injection 12.5-50 mcg  12.5-50 mcg Intravenous Q2H PRN Anders Simmonds, MD   25 mcg at 60/63/01 6010   folic acid (FOLVITE) tablet 1 mg  1 mg Per Tube Daily Jennelle Human B, NP   1 mg at 12/16/21 1149   heparin  injection 1,000-6,000 Units  1,000-6,000 Units CRRT PRN Reesa Chew, MD   3,000 Units at 12/16/21 1125   insulin aspart (novoLOG) injection 0-9 Units  0-9 Units Subcutaneous Q4H Gleason, Otilio Carpen, PA-C   1 Units at 12/16/21 0935   MEDLINE mouth rinse  15 mL Mouth Rinse 10 times per day [name], MD   15 mL at 12/16/21 1126   norepinephrine (LEVOPHED) 77m in 2561m(0.016 mg/mL) premix infusion  2-10 mcg/min Intravenous Titrated YaDrenda FreezeMD   Paused at 12/08/2021 1741   pantoprazole (PROTONIX) injection 40 mg  40 mg Intravenous QHS ChJacky KindleMD   40 mg at 12/01/2021 2155   polyethylene glycol (MIRALAX / GLYCOLAX) packet 17 g  17 g Per Tube Daily SiJennelle Human, NP   17 g at 12/16/21 0936   prismasol BGK 4/2.5 infusion   CRRT Continuous PeReesa ChewMD 800 mL/hr at 12/16/21 1151 New Bag at 12/16/21 1151   propofol (DIPRIVAN) 1000 MG/100ML infusion  0-30 mcg/kg/min Intravenous Continuous SiJennelle Human, NP 6.8 mL/hr at 12/16/21 1200 20 mcg/kg/min at 12/16/21  1200   senna-docusate (Senokot-S) tablet 1 tablet  1 tablet Per Tube BID Jacky Kindle, MD   1 tablet at 12/16/21 0936   sodium chloride (hypertonic) 3 % solution   Intravenous Continuous Donnetta Simpers, MD   Stopped at 12/16/21 1107   sodium chloride 0.9 % primer fluid for CRRT  500 mL CRRT PRN Reesa Chew, MD       thiamine tablet 100 mg  100 mg Per Tube Daily Jennelle Human B, NP   100 mg at 12/16/21 1149   vancomycin (VANCOREADY) IVPB 500 mg/100 mL  500 mg Intravenous Q24H Dang, Thuy D, [facility]         ALLERGIES Patient has no known allergies.  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, schizoaffective disorder   SOCIAL HISTORY Social History   Socioeconomic History   Marital status: Single    Spouse name: Not on file   Number of children: Not on file   Years of education: Not on file   Highest education level: Not on file  Occupational History    Not on file  Tobacco Use   Smoking status: Not on file   Smokeless tobacco: Not on file  Substance and Sexual Activity   Alcohol use: Not on file   Drug use: Not on file   Sexual activity: Not on file  Other Topics Concern   Not on file  Social History Narrative   Not on file   Social Determinants of Health   Financial Resource Strain: Not on file  Food Insecurity: Not on file  Transportation Needs: Not on file  Physical Activity: Not on file  Stress: Not on file  Social Connections: Not on file  Intimate Partner Violence: Not on file     FAMILY HISTORY No family history on file.    Review of Systems: 12 systems reviewed Otherwise as per HPI, all other systems reviewed and negative  Physical Exam: Vitals:   12/16/21 1139 12/16/21 1200  BP:  124/84  Pulse:  (!) 115  Resp:  (!) 30  Temp: 100.2 F (37.9 C)   SpO2:  97%   Total I/O In: 351.4 [I.V.:324; NG/GT:18.8; IV Piggyback:8.7] Out: 65 [Urine:65]  Intake/Output Summary (Last 24 hours) at 12/16/2021 1258 Last data filed at 12/16/2021 1236 Gross per 24 hour  Intake 2408.93 ml  Output 215 ml  Net 2193.93 ml   General: chronically ill, intubated HEENT: MM moist, EEG leads in place CV: regular rate, normal rhythm, no murmurs, no gallops, no rubs, no LE edema Lungs: intubated, coarse breath sounds, no crackles or wheezes anteriorly  Abd: soft,  non-distended, BS present Skin: Multiple abrasions of the RUE and face, thigh and abdominal wounds clean and dressed Musculoskeletal: swelling of the RUE Neuro: sedated, does not open eyes, not follow commands  Test Results Reviewed Lab Results  Component Value Date   NA 155 (H) 12/16/2021   K 4.1 12/16/2021   CL 114 (H) 12/16/2021   CO2 20 (L) 12/16/2021   BUN 209 (H) 12/16/2021   CREATININE 6.88 (H) 12/16/2021   CALCIUM 7.5 (L) 12/16/2021   ALBUMIN 1.7 (L) 12/16/2021   PHOS 6.4 (H) 12/16/2021     I have reviewed all relevant outside healthcare records  related to the patient's current hospitalization

## 2021-12-16 NOTE — Progress Notes (Addendum)
LTM EEG hooked up and running - no initial skin breakdown - push button tested - neuro notified. [facility] monitoring. MR compatible leads

## 2021-12-16 NOTE — Progress Notes (Signed)
Physical Therapy Wound Evaluation/Treatment °Patient Details  °Name: Darryl Parsons °MRN: [id number] °Date of Birth: 04/01/1956 ° °Today's Date: 12/16/2021 °Time: 1335-1428 °Time Calculation (min): 53 min ° °Subjective  °Subjective Assessment °Subjective: Pt lethargic on vent - eyes closed throughout session. °Patient and Family Stated Goals: None stated °Date of Onset:  (Unknown) °Prior Treatments: Dressing changes  °Pain Score:  RN premedicated pt and pt did not appear to be in any pain throughout session.  ° °Wound Assessment  °Pressure Injury 11/17/2021 Hip Left Unstageable - Full thickness tissue loss in which the base of the injury is covered by slough (yellow, tan, gray, green or brown) and/or eschar (tan, brown or black) in the wound bed. (Active)  °Wound Image   12/16/21 1440  °Dressing Type Barrier Film (skin prep);Foam - Lift dressing to assess site every shift;Gauze (Comment);Moist to dry;Santyl 12/16/21 1440  °Dressing Changed;Clean;Dry;Intact 12/16/21 1440  °Dressing Change Frequency Daily 12/16/21 1440  °State of Healing Eschar 12/16/21 1440  °Site / Wound Assessment Red;Yellow;Black 12/16/21 1440  °% Wound base Red or Granulating 25% 12/16/21 1440  °% Wound base Yellow/Fibrinous Exudate 0% 12/16/21 1440  °% Wound base Black/Eschar 65% 12/16/21 1440  °% Wound base Other/Granulation Tissue (Comment) 10% 12/16/21 1440  °Peri-wound Assessment Maceration;Excoriated 12/16/21 1440  °Wound Length (cm) 10.5 cm 12/16/21 1440  °Wound Width (cm) 8 cm 12/16/21 1440  °Wound Depth (cm) 0 cm 12/16/21 1440  °Wound Surface Area (cm^2) 84 cm^2 12/16/21 1440  °Wound Volume (cm^3) 0 cm^3 12/16/21 1440  °Tunneling (cm) 0 12/16/21 1440  °Undermining (cm) 0 12/16/21 1440  °Margins Unattached edges (unapproximated) 12/16/21 1440  °Drainage Amount Minimal 12/16/21 1440  °Drainage Description Serosanguineous 12/16/21 1440  °Treatment Debridement (Selective);Hydrotherapy (Pulse lavage);Packing (Saline gauze) 12/16/21 1440   ° °Hydrotherapy °Pulsed lavage therapy - wound location: L hip °Pulsed Lavage with Suction (psi): 8 psi °Pulsed Lavage with Suction - Normal Saline Used: 1000 mL °Pulsed Lavage Tip: Tip with splash shield °Selective Debridement °Selective Debridement - Location: L hip °Selective Debridement - Tools Used: Forceps, Scalpel °Selective Debridement - Tissue Removed: Eschar  ° ° °Wound Assessment and Plan  °Wound Therapy - Assess/Plan/Recommendations °Wound Therapy - Clinical Statement: Pt presents to hydrotherapy with an unstagable pressure injury to the L hip. Debridement initiated this session. Pt will benefit from continued hydrotherapy for selective removal of unviable tissue, to decrease bioburden, and promote wound bed healing. °Wound Therapy - Functional Problem List: Global weakness s/p unknown time down and ICU admission °Factors Delaying/Impairing Wound Healing: Immobility °Hydrotherapy Plan: Debridement, Dressing change, Patient/family education, Pulsatile lavage with suction °Wound Therapy - Frequency: 6X / week °Wound Therapy - Follow Up Recommendations: dressing changes by RN ° °Wound Therapy Goals- °Improve the function of patient's integumentary system by progressing the wound(s) through the phases of wound healing (inflammation - proliferation - remodeling) by: °Wound Therapy Goals - Improve the function of patient's integumentary system by progressing the wound(s) through the phases of wound healing by: °Decrease Necrotic Tissue to: 20% °Decrease Necrotic Tissue - Progress: Goal set today °Increase Granulation Tissue to: 80% °Increase Granulation Tissue - Progress: Goal set today °Time For Goal Achievement: 7 days °Wound Therapy - Potential for Goals: Good ° °Goals will be updated until maximal potential achieved or discharge criteria met.  Discharge criteria: when goals achieved, discharge from hospital, MD decision/surgical intervention, no progress towards goals, refusal/missing three consecutive  treatments without notification or medical reason. ° °GP °   ° °Charges °PT Wound Care Charges °$Wound Debridement   up to 20 cm: < or equal to 20 cm $ Wound Debridement each add'l 20 sqcm: 3 $PT PLS Gun and Tip: 1 Supply $PT Hydrotherapy Visit: 1 Visit       [name] 12/16/2021, 3:00 PM  [name], PT, DPT Acute Rehabilitation Services Pager: [phone] Office: [phone]

## 2021-12-16 NOTE — Progress Notes (Signed)
OT Cancellation Note  Patient Details Name: MARTON MALIZIA MRN: [id number] DOB: Jun 24, 1956   Cancelled Treatment:    Reason Eval/Treat Not Completed: Patient not medically ready.  Active bedrest order until 1807 on 12/16/2021 per order set. OT will hold, and continue efforts as appropriate.     Aubreyana Saltz D Lindsee Labarre 12/16/2021, 8:32 AM

## 2021-12-16 NOTE — TOC Progression Note (Signed)
Transition of Care [facility]) - Progression Note    Patient Details  Name: Darryl Parsons MRN: [id number] Date of Birth: 1956-08-24  [facility] CM/SW [place], LCSW Phone Number: 12/16/2021, 4:54 PM  Clinical Narrative:    CSW spoke with Chriss Czar, property manager at the [employer] [phone]). She stated patient has no family members as his wife, [name], passed away last year. Lawerance Bach stated that she has been looking out for patient and that he always pays his rent on time and is the first person in line to pay. When he did not show up yesterday, she found it to concerning and went to look for him, found him and called EMS. Patient is normally ambulatory and does not have any trouble obtaining food or issues with activities of daily living. CSW left voicemail for [facility] for guidance on decision making.         Expected Discharge Plan and Services                                                 Social Determinants of Health (SDOH) Interventions    Readmission Risk Interventions No flowsheet data found.

## 2021-12-16 NOTE — Progress Notes (Addendum)
Pharmacy Antibiotic Note  Darryl Parsons is a 66 y.o. male admitted on 11/30/2021 after being found down.  CT shows aspiration vs multilobar PNA with small LUL cavitation.  Pharmacy has been consulted for vancomycin and Unasyn dosing.  SCr down to 6.88, CrCL < 10 ml/min, hypothermic, WBC 19.7.  Plan: Vanc 1gm IV given on 1/31 at 1741 - VR in AM Continue Unasyn 1.5gm IV Q24H Monitor renal fxn, clinical progress   Height: 5\' 10"  (177.8 cm) Weight: 56.7 kg (125 lb) IBW/kg (Calculated) : 73  Temp (24hrs), Avg:97 F (36.1 C), Min:96.3 F (35.7 C), Max:99.5 F (37.5 C)  Recent Labs  Lab 11/18/2021 1616 11/24/2021 1627 12/09/2021 1926 12/16/21 0346  WBC 12.7*  --   --  19.7*  CREATININE 7.04* 7.20*  --  6.88*  LATICACIDVEN 6.6*  --  3.5*  --      Estimated Creatinine Clearance: 8.7 mL/min (A) (by C-G formula based on SCr of 6.88 mg/dL (H)).    No Known Allergies  Unasyn 1/31 >> Vanc 1/31 >> Cefepime x1 1/31   1/31 MRSA PCR - positive 1/31 UCx -  1/31 BCx -   Ivry Pigue D. Mina Marble, PharmD, BCPS, [place] 12/16/2021, 9:35 AM  ================================  Addendum:  Start CRRT  Schedule vanc 500mg  IV Q24H Change Unasyn to 3gm IV Q8H Monitor CRRT interruption/tolerance, clinical progress, vanc level as indicated  Adriella Essex D. Mina Marble, PharmD, BCPS, [place] 12/16/2021, 10:39 AM

## 2021-12-16 NOTE — Progress Notes (Signed)
Vent changes made per CCM post ABG results.

## 2021-12-16 NOTE — Progress Notes (Signed)
Carotid duplex bilateral attempted x2. Patient receiving dialysis RT neck. Will attempt again as schedule and patient availability permits.   Darlin Coco, RDMS, RVT

## 2021-12-16 NOTE — Consult Note (Addendum)
[place] Nurse Consult Note: Reason for Consult: Consult requested for left hip. Performed remotely after review of the progress notes and photos in the EMR. Pt is critically ill in ICU with multiple systemic factors which can impair healing. Pt had an Unstageable pressure injury which was noted as present on admission, according to the nursing wound flow sheet. 90% slough/eschar, 10% red. Pressure Injury POA: Yes Dressing procedure/placement/frequency: Topical treatment orders provided for bedside nurses to perform as follows: Apply Santyl to left hip wound Q day, then cover with moist gauze. (STAFF NURSE CHANGE Q SUN, PHYSICAL THERAPY WILL PERFORM Q MON-SAT WITH HYDROTHERAPY) [place] teams will reassess the location weekly with physical therapy team to determine further plan of care. Julien Girt MSN, RN, [place], [place], [place]

## 2021-12-16 DEATH — deceased

## 2021-12-17 ENCOUNTER — Inpatient Hospital Stay (HOSPITAL_COMMUNITY): Payer: Medicaid Other

## 2021-12-17 DIAGNOSIS — M6282 Rhabdomyolysis: Secondary | ICD-10-CM

## 2021-12-17 DIAGNOSIS — E43 Unspecified severe protein-calorie malnutrition: Secondary | ICD-10-CM | POA: Diagnosis present

## 2021-12-17 DIAGNOSIS — J9601 Acute respiratory failure with hypoxia: Secondary | ICD-10-CM | POA: Diagnosis not present

## 2021-12-17 DIAGNOSIS — N179 Acute kidney failure, unspecified: Secondary | ICD-10-CM | POA: Diagnosis not present

## 2021-12-17 DIAGNOSIS — I616 Nontraumatic intracerebral hemorrhage, multiple localized: Secondary | ICD-10-CM

## 2021-12-17 DIAGNOSIS — I619 Nontraumatic intracerebral hemorrhage, unspecified: Secondary | ICD-10-CM | POA: Diagnosis not present

## 2021-12-17 LAB — RENAL FUNCTION PANEL
Albumin: 1.5 g/dL — ABNORMAL LOW (ref 3.5–5.0)
Albumin: 1.6 g/dL — ABNORMAL LOW (ref 3.5–5.0)
Anion gap: 11 (ref 5–15)
Anion gap: 15 (ref 5–15)
BUN: 176 mg/dL — ABNORMAL HIGH (ref 8–23)
BUN: 174 mg/dL — ABNORMAL HIGH (ref 8–23)
CO2: 18 mmol/L — ABNORMAL LOW (ref 22–32)
CO2: 20 mmol/L — ABNORMAL LOW (ref 22–32)
Calcium: 7.4 mg/dL — ABNORMAL LOW (ref 8.9–10.3)
Calcium: 7.5 mg/dL — ABNORMAL LOW (ref 8.9–10.3)
Chloride: 117 mmol/L — ABNORMAL HIGH (ref 98–111)
Chloride: 114 mmol/L — ABNORMAL HIGH (ref 98–111)
Creatinine, Ser: 5.49 mg/dL — ABNORMAL HIGH (ref 0.61–1.24)
Creatinine, Ser: 5.92 mg/dL — ABNORMAL HIGH (ref 0.61–1.24)
GFR, Estimated: 11 mL/min — ABNORMAL LOW (ref >60)
GFR, Estimated: 10 mL/min — ABNORMAL LOW (ref >60)
Glucose, Bld: 171 mg/dL — ABNORMAL HIGH (ref 70–99)
Glucose, Bld: 175 mg/dL — ABNORMAL HIGH (ref 70–99)
Phosphorus: 4.4 mg/dL (ref 2.5–4.6)
Phosphorus: 5.6 mg/dL — ABNORMAL HIGH (ref 2.5–4.6)
Potassium: 4.7 mmol/L (ref 3.5–5.1)
Potassium: 5 mmol/L (ref 3.5–5.1)
Sodium: 146 mmol/L — ABNORMAL HIGH (ref 135–145)
Sodium: 149 mmol/L — ABNORMAL HIGH (ref 135–145)

## 2021-12-17 LAB — SODIUM
Sodium: 147 mmol/L — ABNORMAL HIGH (ref 135–145)
Sodium: 151 mmol/L — ABNORMAL HIGH (ref 135–145)
Sodium: 149 mmol/L — ABNORMAL HIGH (ref 135–145)

## 2021-12-17 LAB — POCT I-STAT 7, (LYTES, BLD GAS, ICA,H+H)
Acid-base deficit: 2 mmol/L (ref 0.0–2.0)
Bicarbonate: 21.1 mmol/L (ref 20.0–28.0)
Calcium, Ion: 1.09 mmol/L — ABNORMAL LOW (ref 1.15–1.40)
HCT: 46 % (ref 39.0–52.0)
Hemoglobin: 15.6 g/dL (ref 13.0–17.0)
O2 Saturation: 89 %
Patient temperature: 37
Potassium: 4.6 mmol/L (ref 3.5–5.1)
Sodium: 149 mmol/L — ABNORMAL HIGH (ref 135–145)
TCO2: 22 mmol/L (ref 22–32)
pCO2 arterial: 32.3 mmHg (ref 32.0–48.0)
pH, Arterial: 7.422 (ref 7.350–7.450)
pO2, Arterial: 55 mmHg — ABNORMAL LOW (ref 83.0–108.0)

## 2021-12-17 LAB — BLOOD CULTURE ID PANEL (REFLEXED) - BCID2

## 2021-12-17 LAB — HEMOGLOBIN A1C
Hgb A1c MFr Bld: 4.2 % — ABNORMAL LOW (ref 4.8–5.6)
Mean Plasma Glucose: 74 mg/dL

## 2021-12-17 LAB — MAGNESIUM: Magnesium: 3.2 mg/dL — ABNORMAL HIGH (ref 1.7–2.4)

## 2021-12-17 LAB — CBC
HCT: 36 % — ABNORMAL LOW (ref 39.0–52.0)
Hemoglobin: 11.9 g/dL — ABNORMAL LOW (ref 13.0–17.0)
MCH: 26.8 pg (ref 26.0–34.0)
MCHC: 33.1 g/dL (ref 30.0–36.0)
MCV: 81.1 fL (ref 80.0–100.0)
Platelets: 188 10*3/uL (ref 150–400)
RBC: 4.44 MIL/uL (ref 4.22–5.81)
RDW: 15.2 % (ref 11.5–15.5)
WBC: 27.6 10*3/uL — ABNORMAL HIGH (ref 4.0–10.5)
nRBC: 2 % — ABNORMAL HIGH (ref 0.0–0.2)

## 2021-12-17 LAB — URINE CULTURE: Culture: >=100000 — AB

## 2021-12-17 LAB — GLUCOSE, CAPILLARY
Glucose-Capillary: 155 mg/dL — ABNORMAL HIGH (ref 70–99)
Glucose-Capillary: 148 mg/dL — ABNORMAL HIGH (ref 70–99)
Glucose-Capillary: 157 mg/dL — ABNORMAL HIGH (ref 70–99)
Glucose-Capillary: 146 mg/dL — ABNORMAL HIGH (ref 70–99)
Glucose-Capillary: 151 mg/dL — ABNORMAL HIGH (ref 70–99)
Glucose-Capillary: 150 mg/dL — ABNORMAL HIGH (ref 70–99)

## 2021-12-17 LAB — AMMONIA: Ammonia: 46 umol/L — ABNORMAL HIGH (ref 9–35)

## 2021-12-17 LAB — CK: Total CK: 902 U/L — ABNORMAL HIGH (ref 49–397)

## 2021-12-17 LAB — HEPATIC FUNCTION PANEL
ALT: 71 U/L — ABNORMAL HIGH (ref 0–44)
AST: 110 U/L — ABNORMAL HIGH (ref 15–41)
Albumin: 1.6 g/dL — ABNORMAL LOW (ref 3.5–5.0)
Alkaline Phosphatase: 44 U/L (ref 38–126)
Bilirubin, Direct: 0.8 mg/dL — ABNORMAL HIGH (ref 0.0–0.2)
Indirect Bilirubin: 1 mg/dL — ABNORMAL HIGH (ref 0.3–0.9)
Total Bilirubin: 1.8 mg/dL — ABNORMAL HIGH (ref 0.3–1.2)
Total Protein: 5.4 g/dL — ABNORMAL LOW (ref 6.5–8.1)

## 2021-12-17 LAB — TRIGLYCERIDES: Triglycerides: 227 mg/dL — ABNORMAL HIGH (ref <150)

## 2021-12-17 MED ORDER — PANTOPRAZOLE 2 MG/ML SUSPENSION
40.0000 mg | Freq: Every day | ORAL | Status: DC
  Administered 2021-12-17: 40 mg
  Filled 2021-12-17 (×2): qty 20

## 2021-12-17 MED ORDER — FENTANYL 2500MCG IN NS 250ML (10MCG/ML) PREMIX INFUSION
0.0000 ug/h | INTRAVENOUS | Status: DC
  Administered 2021-12-17: 25 ug/h via INTRAVENOUS
  Administered 2021-12-17: 100 ug/h via INTRAVENOUS
  Administered 2021-12-18 – 2021-12-19 (×2): 150 ug/h via INTRAVENOUS
  Filled 2021-12-17 (×4): qty 250

## 2021-12-17 MED ORDER — CALCIUM GLUCONATE-NACL 1-0.675 GM/50ML-% IV SOLN
1.0000 g | Freq: Once | INTRAVENOUS | Status: AC
  Administered 2021-12-17: 1000 mg via INTRAVENOUS
  Filled 2021-12-17: qty 50

## 2021-12-17 MED ORDER — SODIUM CHLORIDE 0.9% FLUSH: 10.0000 mL | INTRAVENOUS | Status: DC | PRN

## 2021-12-17 MED ORDER — SODIUM CHLORIDE 0.9% FLUSH
10.0000 mL | Freq: Two times a day (BID) | INTRAVENOUS | Status: DC
  Administered 2021-12-17 – 2021-12-19 (×3): 10 mL

## 2021-12-17 MED ORDER — B COMPLEX-C PO TABS
1.0000 | ORAL_TABLET | Freq: Every day | ORAL | Status: DC
  Administered 2021-12-17: 1
  Filled 2021-12-17 (×3): qty 1

## 2021-12-17 MED ORDER — MUPIROCIN 2 % EX OINT
1.0000 "application " | TOPICAL_OINTMENT | Freq: Two times a day (BID) | CUTANEOUS | Status: DC
  Administered 2021-12-17 – 2021-12-19 (×6): 1 via NASAL
  Filled 2021-12-17: qty 22

## 2021-12-17 MED ORDER — VANCOMYCIN HCL 500 MG/100ML IV SOLN
500.0000 mg | INTRAVENOUS | Status: DC
  Administered 2021-12-17: 500 mg via INTRAVENOUS
  Filled 2021-12-17 (×2): qty 100

## 2021-12-17 MED ORDER — PROSOURCE TF PO LIQD
90.0000 mL | Freq: Four times a day (QID) | ORAL | Status: DC
  Administered 2021-12-17 (×3): 90 mL
  Filled 2021-12-17 (×4): qty 90

## 2021-12-17 MED ORDER — NOREPINEPHRINE 16 MG/250ML-% IV SOLN
0.0000 ug/min | INTRAVENOUS | Status: DC
  Administered 2021-12-17: 9 ug/min via INTRAVENOUS
  Administered 2021-12-18: 10 ug/min via INTRAVENOUS
  Administered 2021-12-19: 30 ug/min via INTRAVENOUS
  Administered 2021-12-19 (×2): 40 ug/min via INTRAVENOUS
  Filled 2021-12-17 (×5): qty 250

## 2021-12-17 MED ORDER — VITAL 1.5 CAL PO LIQD
1000.0000 mL | ORAL | Status: DC
  Administered 2021-12-17: 1000 mL

## 2021-12-17 NOTE — Progress Notes (Signed)
[place] Progress Note Patient Name: Darryl Parsons DOB: [birth year] MRN: [id number]   Date of Service  12/17/2021  HPI/Events of Note  Ventilator asynchrony continues. Patient double stacking breaths on ventilator. Will increase sedation. Sat on bedside oximetry = 95%  eICU Interventions  Plan: Increase ceiling on Fentanyl IV infusion to 400 mcg/hour.      Intervention Category Major Interventions: Respiratory failure - evaluation and management  Amahia Madonia Eugene 12/17/2021, 4:48 AM

## 2021-12-17 NOTE — Progress Notes (Signed)
[place] Progress Note Patient Name: DAANISH COPES DOB: 1956/01/14 MRN: [id number]   Date of Service  12/17/2021  HPI/Events of Note  Hypocalcemia - Ionized Ca++ = 1.09 and PO--- = 4.4.  eICU Interventions  Will replace Ca++.     Intervention Category Major Interventions: Electrolyte abnormality - evaluation and management  Lanique Gonzalo [place] 12/17/2021, 5:56 AM

## 2021-12-17 NOTE — Progress Notes (Addendum)
Initial Nutrition Assessment  DOCUMENTATION CODES:   Severe malnutrition in context of chronic illness  INTERVENTION:   Tube feeding via OG tube: Increase Vital 1.5 to 50 ml/h (1200 ml per day) Prosource TF 90 ml QID  Provides 2120 kcal, 169 gm protein, 912 ml free water daily  B-complex with C   NUTRITION DIAGNOSIS:   Severe Malnutrition related to chronic illness (CKD, polysubstance abuse) as evidenced by severe fat depletion, severe muscle depletion.  GOAL:   Patient will meet greater than or equal to 90% of their needs  MONITOR:   Skin, Vent status, TF tolerance  REASON FOR ASSESSMENT:   Consult Enteral/tube feeding initiation and management  ASSESSMENT:   Pt with PMH of schizophrenia, cocaine abuse, smoker, CVA, and CKD admitted after being found down in hotel room between wall and bed. 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 Pt positive for amphetamines on admission.    Pt discussed during ICU rounds and with RN. Per RN wife passed last year and Freight forwarder at [place] looks out for him. Pt meets criteria for malnutrition, may have social component.  PT following and providing hydrotherapy for L hip wound. CRRT started 2/1 currently keeping even.   2/1 TF started; Vital 1.5 @ 45 ml/hr with 45 ml ProSource TID - provides: 1740 kcal, 105 grams protein, and 820 ml free water; CRRT started  Patient is currently intubated on ventilator support MV: 11.6 L/min Temp (24hrs), Avg:98.8 F (37.1 C), Min:97.9 F (36.6 C), Max:100.4 F (38 C)  Propofol: weaned off Medications reviewed and include: folic acid, SSI, protonix, miralax, senokot-s, thiamine  Levophed @ 10 mcg  Labs reviewed: Na 147, BUN 176 Ammonia: 46 TB: 227 Vitamin B 12: 312 A1C: 4.2 CBG's: 148-157   69F OG tube: tip in gastric cardia UOP: 240 ml  UF: 1326 ml 2/1   NUTRITION - FOCUSED PHYSICAL EXAM:  Flowsheet  Row Most Recent Value  Orbital Region Severe depletion  Upper Arm Region Severe depletion  Thoracic and Lumbar Region Severe depletion  Buccal Region Unable to assess  Temple Region Severe depletion  Clavicle Bone Region Severe depletion  Clavicle and Acromion Bone Region Severe depletion  Scapular Bone Region Severe depletion  Dorsal Hand Severe depletion  Patellar Region Severe depletion  Anterior Thigh Region Severe depletion  Posterior Calf Region Severe depletion  Edema (RD Assessment) Mild  Hair Reviewed  Eyes Unable to assess  Mouth Unable to assess  Skin Unable to assess  Nails Reviewed       Diet Order:   Diet Order             Diet NPO time specified  Diet effective now                   EDUCATION NEEDS:   Not appropriate for education at this time  Skin:  Skin Assessment: Skin Integrity Issues: Skin Integrity Issues:: Unstageable Unstageable: L hip  Last BM:  unknown  Height:   Ht Readings from Last 1 Encounters:  12/04/2021 5\' 10"  (1.778 m)    Weight:   Wt Readings from Last 1 Encounters:  12/17/21 75.3 kg    BMI:  Body mass index is 23.82 kg/m.  Estimated Nutritional Needs:   Kcal:  2000-2300  Protein:  150-188 grams  Fluid:  >2L/day  Lockie Pares., RD, LDN, CNSC See AMiON for contact information

## 2021-12-17 NOTE — Progress Notes (Addendum)
NAME:  Darryl Parsons, MRN:  [id number], DOB:  12-Nov-1956, LOS: 2 ADMISSION DATE:  11/17/2021, CONSULTATION DATE:  12/17/21 REFERRING MD:  Darl Householder, CHIEF COMPLAINT:  unresponsive   History of Present Illness:  66 year old male with prior history of schizophrenia, cocaine abuse, CVA, and CKD who presented to ER as level 1 trauma.  Patient found unresponsive and agonal in a hotel room between the wall and bed with significant swelling to left eye and forehead and pressure wound on left exterior thigh with pinpoint pupils and right gaze.  Last seen 2 days ago.  Given narcan with no change.  On arrival to ER, he was hypotensive 84/50, HR 130, and was intubated on arrival to ER and underwent trauma evaluation.  Found on workup to have acute right basal ganglia hemorrhage with mild edema and left MLS 26mm with smaller acute left basal ganglia hemorrhages, and acute vs subacute left thalamic infarcts, AKI with concern for rhabdo, lactic acidosis, and suspected aspiration pneumonia. He is requiring levophed for ongoing hypotension.  Neuro consulted and PCCM called for admit.   Pertinent  Medical History   has no past medical history on file.   Duplicate chart MRN: [id number]  Visual hallucinations Bipolar/Depression, ?major Cocaine abuse HTn CKD1 Prior GIB in the past 2/2 to internal hemorrhoids Recent admit to [facility] 10.12.--10.17 Has a repeated pattern of OD and ingestion and reported self-harm  Significant Hospital Events: Including procedures, antibiotic start and stop dates in addition to other pertinent events   1/31 Brought in by EMS, intubated, found to have [place], neuro consulted, on cEEG, unasyn started, on HTS    1/31 SARS/ flu > neg 1/31 Bcx2 >  1/31 UC >  1/31 MRSA PCR > positive   1/31 vanc 1/31 cefepime x1 1/31 unasyn >   Interim History / Subjective:   This morning, ongoing challenges with very mild vent dyssynchrony causing CRRT machine to pause   Overnight incr sedation was  trialed to see if this would help the mild dyssynchrony, which it did not. This morning, I time incr which helped moderately, but still having 1-2 dyssynchronous breathes every couple of minutes causing CRRT to pause   Objective   Blood pressure 109/83, pulse 88, temperature 98.4 F (36.9 C), temperature source Esophageal, resp. rate (!) 22, height 5\' 10"  (1.778 m), weight 75.3 kg, SpO2 92 %.    Vent Mode: PRVC FiO2 (%):  [50 %-60 %] 60 % Set Rate:  [20 bmp] 20 bmp Vt Set:  [550 mL] 550 mL PEEP:  [8 cmH20] 8 cmH20 Plateau Pressure:  [22 cmH20-27 cmH20] 24 cmH20   Intake/Output Summary (Last 24 hours) at 12/17/2021 1124 Last data filed at 12/17/2021 1100 Gross per 24 hour  Intake 2063.2 ml  Output 1841 ml  Net 222.2 ml   Filed Weights   12/05/2021 1705 12/14/2021 1800 12/17/21 0500  Weight: 56.7 kg 56.7 kg 75.3 kg    General:  Critically ill middle aged M intubated sedated supine in bed  HEENT: [place]. Anicteric sclera. Pink mm. ETT secure. Orbital swelling. L sided facial bruising  Neuro:  Sedated, does not follow commands.  CV: rrr s1s2 no rgm cap refill brisk  PULM:  Symmetrical chest expansion, mechanically ventilated. CTAb  GI: soft ndnt + bowel sounds GU: condom cath  Extremities: Generalized edema. No acute joint deformity  Skin: L thigh dressing intact. Scattered abrasions on extremities   Resolved Hospital Problem list     Assessment & Plan:  Acute encephalopathy -- renal failure, uremia, polysubstance use , suspected infection  -no evidence of sz -amphetamines in UDS -ammonia mildly elevated but only 40s, not likely to cause HE P -metabolic correction as below  -eventually, TOC for cessation support if clinically becomes appropriate   Acute R basal ganglia, L basal ganglia ICU Subacute L thalamic infarct  P -neuro following  -f/u neuro imaging  - SBP goals < 140 -maintain neuroprotective measures  -empiric thiamine, folate  Acute hypoxic respiratory failure L  PNA with LUL cavitation -- aspiration vs CAP  P -Cont MV support -- not meeting SBT criteria yet -incr I time to 1.10 due to dyssynchrony  -cont broad abx -- unasyn, vanc  -may need BAL   Hypotension vs shock -medication related (seemed to worsen with overnight sedation increase) vs septic shock with PNA, possible UTI, possible bacteremia P -Cont pressors for MAP > 65  -weaning sedation as able  -abx as above  AKI with rhabdomyolysis  AGMA P -CRRT per nephro   Iatrogenic hypernatremia, improving  Hypocalcemia  P -replace Ca -trend renal function panel   Leukocytosis -E. Coli UTI -1 of 2 Bcx with staph -- ? Contaminant vs pathologic  P -cont broad abx -- unasyn, vanc  -follow micro data -trend WBC, fever curve   Elevated LFTs, mild  - trend   Hyperglycemia  - likely stress response P -sSSI  Pressure wounds, POA  - WOC following > hydrotherapy ordered   Best Practice (right click and "Reselect all SmartList Selections" daily)   Diet/type: NPO, starting TF 2/1 DVT prophylaxis: SCD GI prophylaxis: PPI Lines: N/A s/p R IJ trialysis temp HD cath Foley:  Yes, and it is still needed Code Status:  full code Last date of multidisciplinary goals of care discussion [no family available for contact].  Apparently has lived in hotel room for 5+ years and prior notes report living conditions very poor (roof caving in, bed bugs, snakes in room).  No known NOK.  Will consult TOC to help with these issues, dispo will depend on hospital course  Labs   CBC: Recent Labs  Lab 11/20/2021 1616 12/12/2021 1627 12/16/21 0346 12/16/21 0753 12/17/21 0358 12/17/21 0413  WBC 12.7*  --  19.7*  --  27.6*  --   HGB 16.5 17.3* 13.5 11.6* 11.9* 15.6  HCT 50.3 51.0 39.7 34.0* 36.0* 46.0  MCV 83.4  --  80.0  --  81.1  --   PLT 238  --  175  --  188  --     Basic Metabolic Panel: Recent Labs  Lab 12/07/2021 1616 11/30/2021 1627 12/07/2021 2152 12/16/21 0346 12/16/21 0753 12/16/21 0943  12/16/21 1553 12/16/21 2155 12/17/21 0358 12/17/21 0413 12/17/21 0857  NA 148* 146*   < > 152* 149*   < > 153*   155* 152* 146* 149* 147*  K 4.7 4.7  --  4.3 4.1  --  4.3  --  4.7 4.6  --   CL 111 115*  --  114*  --   --  117*  --  117*  --   --   CO2 15*  --   --  20*  --   --  19*  --  18*  --   --   GLUCOSE 162* 158*  --  145*  --   --  163*  --  171*  --   --   BUN 224* >130*  --  209*  --   --  211*  --  176*  --   --   CREATININE 7.04* 7.20*  --  6.88*  --   --  6.95*  --  5.49*  --   --   CALCIUM 9.0  --   --  7.5*  --   --  7.8*  --  7.4*  --   --   MG  --   --   --  3.4*  --   --   --   --  3.2*  --   --   PHOS  --   --   --  6.4*  --   --  5.1*  --  4.4  --   --    < > = values in this interval not displayed.   GFR: Estimated Creatinine Clearance: 14 mL/min (A) (by C-G formula based on SCr of 5.49 mg/dL (H)). Recent Labs  Lab 12/10/2021 1616 11/15/2021 1926 12/16/21 0346 12/17/21 0358  WBC 12.7*  --  19.7* 27.6*  LATICACIDVEN 6.6* 3.5*  --   --     Liver Function Tests: Recent Labs  Lab 11/26/2021 1616 12/16/21 0346 12/16/21 1553 12/17/21 0358  AST 46* 93*  --  110*  ALT 22 57*  --  71*  ALKPHOS 53 38  --  44  BILITOT 1.5* 1.2  --  1.8*  PROT 6.9 5.1*  --  5.4*  ALBUMIN 2.3* 1.7* 1.7* 1.6*   1.5*   No results for input(s): LIPASE, AMYLASE in the last 168 hours. Recent Labs  Lab 12/16/21 0346 12/17/21 0358  AMMONIA 43* 46*    ABG    Component Value Date/Time   PHART 7.422 12/17/2021 0413   PCO2ART 32.3 12/17/2021 0413   PO2ART 55 (L) 12/17/2021 0413   HCO3 21.1 12/17/2021 0413   TCO2 22 12/17/2021 0413   ACIDBASEDEF 2.0 12/17/2021 0413   O2SAT 89.0 12/17/2021 0413     Coagulation Profile: Recent Labs  Lab 12/10/2021 1616  INR 1.4*    Cardiac Enzymes: Recent Labs  Lab 11/21/2021 1616 12/16/21 0346 12/17/21 0358  CKTOTAL 1,079* 992* 902*  CKMB 21.1*  --   --     HbA1C: No results found for: HGBA1C  CBG: Recent Labs  Lab 12/16/21 1546  12/16/21 2007 12/16/21 2318 12/17/21 0329 12/17/21 0816  GLUCAP 134* 153* 136* 155* 148*    CRITICAL CARE Performed by: Cristal Generous   Total critical care time: 49 minutes  Critical care time was exclusive of separately billable procedures and treating other patients. Critical care was necessary to treat or prevent imminent or life-threatening deterioration.  Critical care was time spent personally by me on the following activities: development of treatment plan with patient and/or surrogate as well as nursing, discussions with consultants, evaluation of patient's response to treatment, examination of patient, obtaining history from patient or surrogate, ordering and performing treatments and interventions, ordering and review of laboratory studies, ordering and review of radiographic studies, pulse oximetry and re-evaluation of patient's condition.   [name] MSN, AGACNP-BC [place] for pager  12/17/2021, 11:24 AM

## 2021-12-17 NOTE — Progress Notes (Signed)
Nephrology Follow-Up Consult note     Requesting provider: [name] Service requesting consult: PCCM Reason for consult: acute on chronic kidney injury     Assessment/Recommendations: Darryl Parsons is a/an 66 y.o. male with a past medical history hypertension, CKD, cocaine use, methamphetamine use, HLD, and schizoaffective idsorder who present after being found unresponsive in his hotel room admitted to the ICU for bilateral basal ganglia hemorrhages, AKI, and aspiration pneumonia.    Anuric Acute on chronic kidney injury (unchanged): Unclear etiology due to difficult history. History of CKD baseline Cr around 1.8. Suspect ATN give hypotension and dehydration. -Chart reviewed: Now on levo likely due to increase in sedating medications -Some increase urine output with about 272m overnight with about 150 mL on bladder scan, will continue to monitor -Continue on CRRT possible uremic encephalopathy with low ultrafiltration rates to avoid dialysis disequilibrium , difficult to assess mental status due to sedation. -Continue to monitor daily Cr, Dose meds for GFR -Monitor Daily I/Os, Daily weight, bladder scan q shift -Maintain MAP>65 for optimal renal perfusion.  -Avoid nephrotoxic medications including NSAIDs and Vanc/Zosyn combo   Volume Status: Euvolemic CXR stable Maintain even on CRRT.     Hypertension: Amlodipine and HCTZ as outpatient. Holding in setting of hypotension, on levo, goal BP <160 for cerebral edema   ICH with cerebral edema: Right basal ganglia with midline shift and smaller left basal ganglia hemorrhage, right thalamus, and right temporal lobe. Hemorrhage stable on repeat CT on 1/31. BP goal <160, maintain sodium 150-155 per neurology/primary   Sepsis: Left sided opacities with cavitation aspiration vs CAP . Blood cultures with gram positive staph anaerobic bottle only, urine cx with pansenstive ecoli  - Intubated, vent management per PCCM  - Currently on vanc and  unasyn  Recommendations conveyed to primary service.    JIona Beard MD IMTS, PGY-2 12/17/2021 9:23 AM  ___________________________________________________________  CC: altered mental status  Interval History/Subjective: Overnight following some commands minimally to squeeze hands on the right side. Fentanyl added and propofol increased due to vent dyssynchrony and CRRT alarms. About 150 mL urine output overnight, with 151 on bladder scan. Unable to rouse patient to pain likely due to sedation, vent setting adjust by PCCM to hopefully go down on sedation. Patient appeared more comfortable after change in vent setting.   Medications:  Current Facility-Administered Medications  Medication Dose Route Frequency Provider Last Rate Last Admin    prismasol BGK 4/2.5 infusion   CRRT Continuous PReesa Chew MD 300 mL/hr at 12/16/21 2013 New Bag at 12/16/21 2013    prismasol BGK 4/2.5 infusion   CRRT Continuous PReesa Chew MD 300 mL/hr at 12/16/21 2013 New Bag at 12/16/21 2013    stroke: mapping our early stages of recovery book   Does not apply Once CJacky Kindle MD       0.9 %  sodium chloride infusion   Intravenous PRN SAnders Simmonds MD 5 mL/hr at 12/17/21 0600 Infusion Verify at 12/17/21 0600   acetaminophen (TYLENOL) tablet 650 mg  650 mg Per Tube Q4H PRN CJacky Kindle MD   650 mg at 12/16/21 1149   Or   acetaminophen (TYLENOL) 160 MG/5ML solution 650 mg  650 mg Per Tube Q4H PRN CJacky Kindle MD   650 mg at 12/16/21 2025   Or   acetaminophen (TYLENOL) suppository 650 mg  650 mg Rectal Q4H PRN CJacky Kindle MD       Ampicillin-Sulbactam (UNASYN) 3 g in sodium chloride 0.9 %  100 mL IVPB  3 g Intravenous Q8H Tyrone Apple, [place]   Stopped at 12/17/21 3976   chlorhexidine gluconate (MEDLINE KIT) (PERIDEX) 0.12 % solution 15 mL  15 mL Mouth Rinse BID [name], MD   15 mL at 12/17/21 0800   Chlorhexidine Gluconate Cloth 2 % PADS 6 each  6 each Topical Daily Anders Simmonds,  MD   6 each at 12/16/21 7341   collagenase (SANTYL) ointment   Topical Daily Jacky Kindle, MD   1 application at 93/79/02 0951   feeding supplement (PROSource TF) liquid 45 mL  45 mL Per Tube TID [name], MD   45 mL at 12/17/21 0901   feeding supplement (VITAL 1.5 CAL) liquid 1,000 mL  1,000 mL Per Tube Continuous [name], MD 45 mL/hr at 12/17/21 0500 Rate Change at 12/17/21 0500   fentaNYL (SUBLIMAZE) injection 12.5-50 mcg  12.5-50 mcg Intravenous Q2H PRN Anders Simmonds, MD   50 mcg at 12/16/21 2248   fentaNYL 2549mg in NS 2532m(1028mml) infusion-PREMIX  0-400 mcg/hr Intravenous Continuous SomAnders SimmondsD   Stopped at 02/40/97/3513299folic acid (FOLVITE) tablet 1 mg  1 mg Per Tube Daily SimJennelle Human NP   1 mg at 12/17/21 0901   heparin injection 1,000-6,000 Units  1,000-6,000 Units CRRT PRN PeeReesa ChewD   3,000 Units at 12/16/21 1803   insulin aspart (novoLOG) injection 0-9 Units  0-9 Units Subcutaneous Q4H Gleason, LauOtilio CarpenA-C   1 Units at 12/17/21 0852426MEDLINE mouth rinse  15 mL Mouth Rinse 10 times per day AgaKipp BroodD   15 mL at 12/17/21 0904   mupirocin ointment (BACTROBAN) 2 % 1 application  1 application Nasal BID AgaKipp BroodD   1 application at 02/83/41/9601   norepinephrine (LEVOPHED) 16 mg in 250m34memix infusion  0-40 mcg/min Intravenous Titrated AgarKipp Brood 18.75 mL/hr at 12/17/21 0700 20 mcg/min at 12/17/21 0700   pantoprazole sodium (PROTONIX) 40 mg/20 mL oral suspension 40 mg  40 mg Per Tube QHS AgarKipp Brood       polyethylene glycol (MIRALAX / GLYCOLAX) packet 17 g  17 g Per Tube Daily SimpJennelle HumanNP   17 g at 12/17/21 0901   prismasol BGK 4/2.5 infusion   CRRT Continuous PeepReesa Chew 800 mL/hr at 12/17/21 0334 New Bag at 12/17/21 0334   propofol (DIPRIVAN) 1000 MG/100ML infusion  0-30 mcg/kg/min Intravenous Continuous SommAnders Simmonds   Stopped at 12/17/21 0914   senna-docusate (Senokot-S) tablet  1 tablet  1 tablet Per Tube BID ChanJacky Kindle   1 tablet at 12/17/21 0901   sodium chloride 0.9 % primer fluid for CRRT  500 mL CRRT PRN PeepReesa Chew   4,000 mL at 12/16/21 2110   sodium chloride flush (NS) 0.9 % injection 10-40 mL  10-40 mL Intracatheter Q12H Agarwala, RaviEinar Grad       sodium chloride flush (NS) 0.9 % injection 10-40 mL  10-40 mL Intracatheter PRN AgarKipp Brood       thiamine tablet 100 mg  100 mg Per Tube Daily SimpJennelle HumanNP   100 mg at 12/17/21 0901   vancomycin (VANCOREADY) IVPB 500 mg/100 mL  500 mg Intravenous Q24H DangTyrone AppleH [facility] Ltdtopped at 12/16/21 1706      Review of Systems: 10 systems reviewed and negative except per interval history/subjective  Physical  Exam: Vitals:   12/17/21 0718 12/17/21 0800  BP:  109/83  Pulse:  88  Resp: (!) 24 19  Temp:  98.4 F (36.9 C)  SpO2: 93% 93%   Total I/O In: 255.5 [I.V.:131.2; NG/GT:90; IV Piggyback:34.3] Out: 300 [Other:300]  Intake/Output Summary (Last 24 hours) at 12/17/2021 7703 Last data filed at 12/17/2021 0900 Gross per 24 hour  Intake 2131.04 ml  Output 1841 ml  Net 290.04 ml   Constitutional: intubated, ill appearing, restless with vent ENMT: bruising to left side of face CV: normal rate, trace bilateral edema to the knees, RUE edematous, warm with 2+ radial puse Respiratory: clear to auscultation anteriorly, ventilated  Gastrointestinal: soft, no palpable masses or hernias Skin: thigh and abdominal wounds clean and dressed Neuro: sedated, does not open eyes, not follow commands, minimally responsive to pain   Test Results I personally reviewed new and old clinical labs and radiology tests Lab Results  Component Value Date   NA 149 (H) 12/17/2021   K 4.6 12/17/2021   CL 117 (H) 12/17/2021   CO2 18 (L) 12/17/2021   BUN 176 (H) 12/17/2021   CREATININE 5.49 (H) 12/17/2021   CALCIUM 7.4 (L) 12/17/2021   ALBUMIN 1.6 (L) 12/17/2021   ALBUMIN 1.5 (L) 12/17/2021   PHOS 4.4  12/17/2021

## 2021-12-17 NOTE — Progress Notes (Signed)
[facility] Progress Note Patient Name: MERLON ALCORTA DOB: [birth year] MRN: [id number]   Date of Service  12/17/2021  HPI/Events of Note  Ventilator asynchrony which is causing the CRRT machine to alarm. Presently sedated with a Propofol IV infusion and Fentanyl IV PRN. BP is soft BP = 93/70 with MAP = 78.  eICU Interventions  Plan: Fentanyl IV infusion. Titrate to RASS =  0 to -1. Start already order Norepinephrine IV infusion for hemodynamic support if need.      Intervention Category Major Interventions: Delirium, psychosis, severe agitation - evaluation and management;Respiratory failure - evaluation and management  Lysle Dingwall 12/17/2021, 12:31 AM

## 2021-12-17 NOTE — Progress Notes (Signed)
SLP Cancellation Note  Patient Details Name: Darryl Parsons MRN: [id number] DOB: 03-25-1956   Cancelled treatment:        Will initiate speech-language-cognitive eval once extubated   [name] 12/17/2021, 7:39 AM

## 2021-12-17 NOTE — Progress Notes (Signed)
PHARMACY - PHYSICIAN COMMUNICATION CRITICAL VALUE ALERT - BLOOD CULTURE IDENTIFICATION (BCID)  Darryl Parsons is an 66 y.o. male who presented to [facility] on 11/29/2021 with a chief complaint of trauma.  Assessment:  Admitted with multiple issues including ICH and respiratory failure, now w/ blood cx growing staph spp in 1 of 3 bottles, likely a contaminant.  Name of physician (or Provider) Contacted: SSommer MD  Current antibiotics: Unasyn, vanc  Changes to prescribed antibiotics recommended:  No changes necessary at this time.  Results for orders placed or performed during the hospital encounter of 12/02/2021  Blood Culture ID Panel (Reflexed) (Collected: 12/01/2021  4:08 PM)  Result Value Ref Range   Enterococcus faecalis NOT DETECTED NOT DETECTED   Enterococcus Faecium NOT DETECTED NOT DETECTED   Listeria monocytogenes NOT DETECTED NOT DETECTED   Staphylococcus species DETECTED (A) NOT DETECTED   Staphylococcus aureus (BCID) NOT DETECTED NOT DETECTED   Staphylococcus epidermidis NOT DETECTED NOT DETECTED   Staphylococcus lugdunensis NOT DETECTED NOT DETECTED   Streptococcus species NOT DETECTED NOT DETECTED   Streptococcus agalactiae NOT DETECTED NOT DETECTED   Streptococcus pneumoniae NOT DETECTED NOT DETECTED   Streptococcus pyogenes NOT DETECTED NOT DETECTED   A.calcoaceticus-baumannii NOT DETECTED NOT DETECTED   Bacteroides fragilis NOT DETECTED NOT DETECTED   Enterobacterales NOT DETECTED NOT DETECTED   Enterobacter cloacae complex NOT DETECTED NOT DETECTED   Escherichia coli NOT DETECTED NOT DETECTED   Klebsiella aerogenes NOT DETECTED NOT DETECTED   Klebsiella oxytoca NOT DETECTED NOT DETECTED   Klebsiella pneumoniae NOT DETECTED NOT DETECTED   Proteus species NOT DETECTED NOT DETECTED   Salmonella species NOT DETECTED NOT DETECTED   Serratia marcescens NOT DETECTED NOT DETECTED   Haemophilus influenzae NOT DETECTED NOT DETECTED   Neisseria meningitidis NOT  DETECTED NOT DETECTED   Pseudomonas aeruginosa NOT DETECTED NOT DETECTED   Stenotrophomonas maltophilia NOT DETECTED NOT DETECTED   Candida albicans NOT DETECTED NOT DETECTED   Candida auris NOT DETECTED NOT DETECTED   Candida glabrata NOT DETECTED NOT DETECTED   Candida krusei NOT DETECTED NOT DETECTED   Candida parapsilosis NOT DETECTED NOT DETECTED   Candida tropicalis NOT DETECTED NOT DETECTED   Cryptococcus neoformans/gattii NOT DETECTED NOT DETECTED    Wynona Neat, PharmD, BCPS  12/17/2021  5:01 AM

## 2021-12-17 NOTE — Progress Notes (Signed)
Over course of the day, CRRT dysfunction initially seemingly related to very mild vent dyssynchrony but has persisted despite improvement w/ vent On Cxr trialysis catheter appears appropriately positioned, but possible that it is against a vessel wall causing CRRT dysfunction.   Using maximal sterile technique, trialysis catheter withdrawn 2cm. Sutured in place. Biopatch and sterile dressing applied     [name] MSN, AGACNP-BC [place] 12/17/2021, 6:15 PM

## 2021-12-17 NOTE — Progress Notes (Signed)
Pt transported to and from CT scan on the ventilator without incident. 

## 2021-12-17 NOTE — Progress Notes (Signed)
OT Cancellation Note  Patient Details Name: Darryl Parsons MRN: [id number] DOB: 29-Jul-1956   Cancelled Treatment:    Reason Eval/Treat Not Completed: Patient not medically ready. Discussed OT sign off with RN given patient's current medical condition.  Please re-consult when appropriate.    Saher Davee D Eytan Carrigan 12/17/2021, 10:19 AM

## 2021-12-17 NOTE — Progress Notes (Signed)
Pt almost maxed on peripheral dose of levophed. Nephro paged to allow access to use purple port on trialysis catheter for medication use. New verbal order placed allowing medication use through purple port. No labs to be drawn off trialysis catheter. Will continue to monitor

## 2021-12-17 NOTE — Procedures (Signed)
Patient was seen and evaluated on CRRT.  Tolerating dialysis without significant issues.  Continue low ultrafiltration rates.  Maintain even. I have reviewed the session itself and made appropriate changes.   Filed Weights   12/04/2021 1705 12/08/2021 1800 12/17/21 0500  Weight: 56.7 kg 56.7 kg 75.3 kg    Recent Labs  Lab 12/17/21 0358 12/17/21 0413  NA 146* 149*  K 4.7 4.6  CL 117*  --   CO2 18*  --   GLUCOSE 171*  --   BUN 176*  --   CREATININE 5.49*  --   CALCIUM 7.4*  --   PHOS 4.4  --     Recent Labs  Lab 11/18/2021 1616 12/12/2021 1627 12/16/21 0346 12/16/21 0753 12/17/21 0358 12/17/21 0413  WBC 12.7*  --  19.7*  --  27.6*  --   HGB 16.5   < > 13.5 11.6* 11.9* 15.6  HCT 50.3   < > 39.7 34.0* 36.0* 46.0  MCV 83.4  --  80.0  --  81.1  --   PLT 238  --  175  --  188  --    < > = values in this interval not displayed.    Scheduled Meds:   stroke: mapping our early stages of recovery book   Does not apply Once   chlorhexidine gluconate (MEDLINE KIT)  15 mL Mouth Rinse BID   Chlorhexidine Gluconate Cloth  6 each Topical Daily   collagenase   Topical Daily   feeding supplement (PROSource TF)  45 mL Per Tube TID   folic acid  1 mg Per Tube Daily   insulin aspart  0-9 Units Subcutaneous Q4H   mouth rinse  15 mL Mouth Rinse 10 times per day   mupirocin ointment  1 application Nasal BID   pantoprazole sodium  40 mg Per Tube QHS   polyethylene glycol  17 g Per Tube Daily   senna-docusate  1 tablet Per Tube BID   sodium chloride flush  10-40 mL Intracatheter Q12H   thiamine  100 mg Per Tube Daily   Continuous Infusions:   prismasol BGK 4/2.5 300 mL/hr at 12/16/21 2013    prismasol BGK 4/2.5 300 mL/hr at 12/16/21 2013   sodium chloride 5 mL/hr at 12/17/21 0600   ampicillin-sulbactam (UNASYN) IV Stopped (12/17/21 6962)   feeding supplement (VITAL 1.5 CAL) 45 mL/hr at 12/17/21 0500   fentaNYL infusion INTRAVENOUS Stopped (12/17/21 0914)   norepinephrine (LEVOPHED) Adult  infusion 20 mcg/min (12/17/21 0700)   prismasol BGK 4/2.5 800 mL/hr at 12/17/21 0334   propofol (DIPRIVAN) infusion Stopped (12/17/21 0914)   vancomycin Stopped (12/16/21 1706)   PRN Meds:.sodium chloride, acetaminophen **OR** acetaminophen (TYLENOL) oral liquid 160 mg/5 mL **OR** acetaminophen, fentaNYL (SUBLIMAZE) injection, heparin, sodium chloride, sodium chloride flush   Santiago Bumpers,  MD 12/17/2021, 9:20 AM

## 2021-12-17 NOTE — Progress Notes (Signed)
PT Cancellation Note  Patient Details Name: Darryl Parsons MRN: [id number] DOB: 10/02/1956   Cancelled Treatment:    Reason Eval/Treat Not Completed: Medical issues which prohibited therapy;Fatigue/lethargy limiting ability to participate. Pt remains on CRRT, intubated, having difficulty following commands. PT will follow up at a later time.   Zenaida Niece 12/17/2021, 6:18 PM

## 2021-12-17 NOTE — Progress Notes (Addendum)
STROKE TEAM PROGRESS NOTE   INTERVAL HISTORY Patient is seen in his room with no family at the bedside.  He has been hypotensive at times overnight.  He remains intubated and on CRRT.  His neurological exam (on sedation) has worsened, with patient less responsive and not following commands but stat head CT head done this morning shows no acute changes.  Blood pressure adequately controlled.  Vitals:   12/17/21 0700 12/17/21 0718 12/17/21 0800 12/17/21 1011  BP: (!) 85/72  109/83   Pulse: 93  88   Resp: 18 (!) 24 19 (!) 22  Temp: 98.4 F (36.9 C)  98.4 F (36.9 C)   TempSrc:   Esophageal   SpO2: 92% 93% 93% 92%  Weight:      Height:       CBC:  Recent Labs  Lab 12/16/21 0346 12/16/21 0753 12/17/21 0358 12/17/21 0413  WBC 19.7*  --  27.6*  --   HGB 13.5   < > 11.9* 15.6  HCT 39.7   < > 36.0* 46.0  MCV 80.0  --  81.1  --   PLT 175  --  188  --    < > = values in this interval not displayed.    Basic Metabolic Panel:  Recent Labs  Lab 12/16/21 0346 12/16/21 0753 12/16/21 1553 12/16/21 2155 12/17/21 0358 12/17/21 0413 12/17/21 0857  NA 152*   < > 153*   155*   < > 146* 149* 147*  K 4.3   < > 4.3  --  4.7 4.6  --   CL 114*  --  117*  --  117*  --   --   CO2 20*  --  19*  --  18*  --   --   GLUCOSE 145*  --  163*  --  171*  --   --   BUN 209*  --  211*  --  176*  --   --   CREATININE 6.88*  --  6.95*  --  5.49*  --   --   CALCIUM 7.5*  --  7.8*  --  7.4*  --   --   MG 3.4*  --   --   --  3.2*  --   --   PHOS 6.4*  --  5.1*  --  4.4  --   --    < > = values in this interval not displayed.    Lipid Panel:  Recent Labs  Lab 12/16/21 0346 12/17/21 0358  CHOL 110  --   TRIG 118   122 227*  HDL 31*  --   CHOLHDL 3.5  --   VLDL 24  --   LDLCALC 55  --     HgbA1c: No results for input(s): HGBA1C in the last 168 hours. Urine Drug Screen:  Recent Labs  Lab 12/16/21 0026  LABOPIA NONE DETECTED  COCAINSCRNUR NONE DETECTED  LABBENZ NONE DETECTED  AMPHETMU  POSITIVE*  THCU NONE DETECTED  LABBARB NONE DETECTED     Alcohol Level  Recent Labs  Lab 12/13/2021 1616  ETH <10     IMAGING past 24 hours CT HEAD WO CONTRAST (5MM)  Result Date: 12/17/2021 CLINICAL DATA:  Altered mental status EXAM: CT HEAD WITHOUT CONTRAST TECHNIQUE: Contiguous axial images were obtained from the base of the skull through the vertex without intravenous contrast. RADIATION DOSE REDUCTION: This exam was performed according to the departmental dose-optimization program which includes automated exposure control, adjustment  of the mA and/or kV according to patient size and/or use of iterative reconstruction technique. COMPARISON:  12/13/2021 FINDINGS: Brain: Unchanged intraparenchymal hemorrhage within the right basal ganglia, centered on the lentiform nuclei, measuring 4.3 x 2.5 cm (series 2, image 19). Adjacent edema is similar as is mass effect on the right lateral ventricle and mild right-to-left midline shift of no greater than 0.5 cm. Additional punctuate hemorrhage within the left temporal pole, unchanged (series 2, image 13) unchanged encephalomalacia of the left cerebellar hemisphere. Vascular: No hyperdense vessel or unexpected calcification. Skull: Normal. Negative for fracture or focal lesion. Sinuses/Orbits: Small air-fluid level in the left maxillary sinus Other: None. IMPRESSION: 1. Unchanged intraparenchymal hemorrhage within the right basal ganglia, centered on the lentiform nuclei, measuring 4.3 x 2.5 cm. Adjacent edema is similar as is mass effect on the right lateral ventricle and mild right-to-left midline shift of no greater than 0.5 cm. 2. Additional punctuate hemorrhage within the left temporal pole, unchanged. 3. Other small foci of hemorrhage previously described are not well appreciated on current CT. Electronically Signed   By: Delanna Ahmadi M.D.   On: 12/17/2021 10:59   DG CHEST PORT 1 VIEW  Result Date: 12/17/2021 CLINICAL DATA:  Respiratory distress EXAM:  PORTABLE CHEST 1 VIEW COMPARISON:  December 16, 2021, December 15, 2021 FINDINGS: The cardiomediastinal silhouette is unchanged in contour.ETT tip terminates 5 cm above the carina. The enteric tube courses through the chest to the abdomen beyond the field-of-view. RIGHT neck CVC tip terminates over the expected region of the SVC. Trace LEFT pleural effusion. No pneumothorax. Multifocal airspace opacities throughout the LEFT lung in the lower lung predominant distribution. There are few patchy opacities at the RIGHT lung base which are increased in comparison to prior. Cavitary area projects over the mid LEFT lung is better assessed on recent CT and is estimated to measure 3 cm. IMPRESSION: 1.  Support apparatus as described above. 2. LEFT greater than RIGHT airspace opacities likely reflecting multifocal infection, most likely of aspiration etiology. There is a cavitary area in the LEFT lung which is grossly similar in comparison to prior chest CT. Electronically Signed   By: Valentino Saxon M.D.   On: 12/17/2021 08:01    PHYSICAL EXAM General: Intubated, sedated well-developed, well-nourished male with wounds on left side of face in no acute distress  Respiratory:  Respirations synchronous with ventilator  Neurological (after recent fentanyl administration): Intubated and sedated.  Eyes are closed.  Pupils 37mm, round and nonreactive. Corneal reflex intact, oculocephalic reflex absent. Cough reflex intact.  Does not follow commands and no response to noxious stimuli  ASSESSMENT/PLAN Darryl Parsons is a 66 y.o. male with history of schizophrenia, stroke, cocaine use, CKD, HTN and smoking presenting after being found down in a hotel room between the bed and the wall.  He was intubated on arrival to the ED and found to have a pressure injury to the left hip.  On CT, he was found to have a 4cm acute right basal ganglia hemorrhage with edema and 25mm midline shift, along with smaller hemorrhages in the  left basal ganglia, right thalamus and right temporal lobe.  He was also found to have an AKI with Cr of 7.04 and CK of 1079.  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 MRI  pending MRA  pending Carotid Doppler  pending 2D Echo EF 60-65%, severe LVH, grade 1 diastolic dysfunction, no atrial level shunt LDL 55 HgbA1c No results found for requested labs within last 26280 hours. VTE prophylaxis - SCDs    Diet   Diet NPO time specified   No antithrombotic prior to admission, now on No antithrombotic secondary to IPH Therapy recommendations:  pending Disposition:  pending  Cerebral edema with midline shift 43mm right to left midline shift noted on CT 3% HTS given overnight but stopped due to Na of 155 Midline shift less prominent on follow up CT  Hypertension Home meds:  amlodipine 10 mg daily Stable Keep SBP <140 Long-term BP goal normotensive  Hyperlipidemia Home meds:  none LDL 55, goal < 70 High intensity statin not indicated as LDL below goal Continue statin at discharge  Risk for Diabetes type II  Home meds:  none HgbA1c No results found for requested labs within last 26280 hours., goal < 7.0 CBGs Recent Labs    12/16/21 2318 12/17/21 0329 12/17/21 0816  GLUCAP 136* 155* 148*     SSI  Respiratory failure Intubated in ED due to GCS of 3 Ventilator management per CCM  AKI on CKD Cr 6.88 CRRT per CCM Avoid contrast when possible Renally dose medications as  appropriate  Other Stroke Risk Factors Cigarette smoker advised to stop smoking Substance abuse - UDS:  THC NONE DETECTED, Cocaine NONE DETECTED. Positive for amphetamines. Patient advised to stop using due to stroke risk. Hx stroke  Other Active Problems Schizophrenia   Hospital day # [place] , MSN, AGACNP-BC [facility] See Amion for schedule and pager information 12/17/2021 11:57 AM  I have personally obtained history,examined this patient, reviewed notes, independently viewed imaging studies, participated in medical decision making and plan of care.ROS completed by me personally and pertinent positives fully documented  I have made any additions or clarifications directly to the above note. Agree with note above.  Patient neurological exam appears to have deteriorated with patient being less responsive this may be related to increased sedation and stat repeat CT head this morning shows no acute changes.  Continue dialysis as per nephrology and critical care team and ventilatory support for respiratory failure.  Consider doing an MRI if patient is stable enough to be transferred for MRI.  No family available at the bedside.  Discussed with Dr. Doyne Keel the critical care medicine.This patient is critically ill and at significant risk of neurological worsening, death and care requires constant monitoring of vital signs, hemodynamics,respiratory and cardiac monitoring, extensive review of multiple databases, frequent neurological assessment, discussion with family, other specialists and medical decision making of high complexity.I have made any additions or clarifications directly to the above note.This critical care time does not reflect procedure time, or teaching time or supervisory time of PA/NP/Med Resident etc but could involve care discussion time.  I spent 30 minutes of neurocritical care time  in the care of  this patient.      Antony Contras, MD Medical  Director [place] Pager: [phone] 12/17/2021 3:04 PM    To contact Stroke Continuity provider, please refer to [email or web address] After hours, contact General Neurology

## 2021-12-17 NOTE — Progress Notes (Signed)
Carotid artery duplex has been completed. Preliminary results can be found in CV Proc through chart review.   12/17/21 12:40 PM Darryl Parsons RVT

## 2021-12-17 NOTE — TOC Progression Note (Signed)
Transition of Care [facility]) - Progression Note    Patient Details  Name: Darryl Parsons MRN: [id number] Date of Birth: 06/20/1956  Transition of Care [facility]) CM/SW [employer], LCSW Phone Number: 12/17/2021, 4:25 PM  Clinical Narrative:    CSW completed APS report to [facility] with [facility].        Expected Discharge Plan and Services                                                 Social Determinants of Health (SDOH) Interventions    Readmission Risk Interventions No flowsheet data found.

## 2021-12-17 NOTE — Progress Notes (Addendum)
Physical Therapy Wound Treatment Patient Details  Name: Darryl Parsons MRN: [id number] Date of Birth: Jul 05, 1956  Today's Date: 12/17/2021 Time: 9292-4462 Time Calculation (min): 31 min  Subjective  Subjective Assessment Subjective: Pt lethargic on vent - eyes closed throughout session. Patient and Family Stated Goals: None stated Date of Onset:  (Unknown) Prior Treatments: Dressing changes  Pain Score:  Pt did not show any signs of discomfort during session.  Wound Assessment  Pressure Injury 12/08/2021 Hip Left Unstageable - Full thickness tissue loss in which the base of the injury is covered by slough (yellow, tan, gray, green or brown) and/or eschar (tan, brown or black) in the wound bed. (Active)  Dressing Type Barrier Film (skin prep);Foam - Lift dressing to assess site every shift;Gauze (Comment);Moist to moist;Santyl 12/17/21 1809  Dressing Changed;Clean;Dry;Intact 12/17/21 1809  Dressing Change Frequency Daily 12/17/21 1809  State of Healing Eschar 12/17/21 1809  Site / Wound Assessment Yellow;Red;Black 12/17/21 1809  % Wound base Red or Granulating 25% 12/17/21 1809  % Wound base Yellow/Fibrinous Exudate 50% 12/17/21 1809  % Wound base Black/Eschar 25% 12/17/21 1809  % Wound base Other/Granulation Tissue (Comment) 0% 12/17/21 1809  Peri-wound Assessment Maceration;Excoriated 12/17/21 1809  Wound Length (cm) 10.5 cm 12/16/21 1440  Wound Width (cm) 8 cm 12/16/21 1440  Wound Depth (cm) 0 cm 12/16/21 1440  Wound Surface Area (cm^2) 84 cm^2 12/16/21 1440  Wound Volume (cm^3) 0 cm^3 12/16/21 1440  Tunneling (cm) 0 12/16/21 1440  Undermining (cm) 0 12/16/21 1440  Margins Unattached edges (unapproximated) 12/17/21 1809  Drainage Amount Minimal 12/17/21 1809  Drainage Description Serosanguineous 12/17/21 1809  Treatment Debridement (Selective);Hydrotherapy (Pulse lavage);Packing (Saline gauze) 12/17/21 1809   Hydrotherapy Pulsed lavage therapy - wound location: L hip Pulsed  Lavage with Suction (psi): 8 psi Pulsed Lavage with Suction - Normal Saline Used: 1000 mL Pulsed Lavage Tip: Tip with splash shield Selective Debridement Selective Debridement - Location: L hip Selective Debridement - Tools Used: Forceps, Scalpel Selective Debridement - Tissue Removed: Eschar    Wound Assessment and Plan  Wound Therapy - Assess/Plan/Recommendations Wound Therapy - Clinical Statement: Progressing with debridement. Pt will benefit from continued hydrotherapy for selective removal of unviable tissue, to decrease bioburden, and promote wound bed healing. Wound Therapy - Functional Problem List: Global weakness s/p unknown time down and ICU admission Factors Delaying/Impairing Wound Healing: Immobility Hydrotherapy Plan: Debridement, Dressing change, Patient/family education, Pulsatile lavage with suction Wound Therapy - Frequency: 6X / week Wound Therapy - Follow Up Recommendations: dressing changes by RN  Wound Therapy Goals- Improve the function of patient's integumentary system by progressing the wound(s) through the phases of wound healing (inflammation - proliferation - remodeling) by: Wound Therapy Goals - Improve the function of patient's integumentary system by progressing the wound(s) through the phases of wound healing by: Decrease Necrotic Tissue to: 20% Decrease Necrotic Tissue - Progress: Progressing toward goal Increase Granulation Tissue to: 80% Increase Granulation Tissue - Progress: Progressing toward goal Time For Goal Achievement: 7 days Wound Therapy - Potential for Goals: Good  Goals will be updated until maximal potential achieved or discharge criteria met.  Discharge criteria: when goals achieved, discharge from hospital, MD decision/surgical intervention, no progress towards goals, refusal/missing three consecutive treatments without notification or medical reason.  GP     Charges PT Wound Care Charges $Wound Debridement up to 20 cm: < or equal  to 20 cm $ Wound Debridement each add'l 20 sqcm: 3 $PT PLS Gun and Tip: 1 Supply $PT Hydrotherapy  Visit: 1 Visit       [name] 12/17/2021, 7:10 PM  [name], PT, DPT Acute Rehabilitation Services Pager: [phone] Office: [phone]

## 2021-12-18 ENCOUNTER — Inpatient Hospital Stay (HOSPITAL_COMMUNITY): Payer: Medicaid Other

## 2021-12-18 ENCOUNTER — Other Ambulatory Visit (HOSPITAL_COMMUNITY): Payer: Medicaid Other

## 2021-12-18 DIAGNOSIS — I619 Nontraumatic intracerebral hemorrhage, unspecified: Secondary | ICD-10-CM | POA: Diagnosis not present

## 2021-12-18 DIAGNOSIS — N179 Acute kidney failure, unspecified: Secondary | ICD-10-CM | POA: Diagnosis not present

## 2021-12-18 DIAGNOSIS — J9601 Acute respiratory failure with hypoxia: Secondary | ICD-10-CM | POA: Diagnosis not present

## 2021-12-18 LAB — POCT I-STAT 7, (LYTES, BLD GAS, ICA,H+H)
Acid-Base Excess: 1 mmol/L (ref 0.0–2.0)
Acid-Base Excess: 0 mmol/L (ref 0.0–2.0)
Acid-base deficit: 2 mmol/L (ref 0.0–2.0)
Acid-base deficit: 3 mmol/L — ABNORMAL HIGH (ref 0.0–2.0)
Acid-base deficit: 6 mmol/L — ABNORMAL HIGH (ref 0.0–2.0)
Bicarbonate: 21.8 mmol/L (ref 20.0–28.0)
Bicarbonate: 27.9 mmol/L (ref 20.0–28.0)
Bicarbonate: 27.2 mmol/L (ref 20.0–28.0)
Bicarbonate: 30.8 mmol/L — ABNORMAL HIGH (ref 20.0–28.0)
Bicarbonate: 25.9 mmol/L (ref 20.0–28.0)
Calcium, Ion: 1.05 mmol/L — ABNORMAL LOW (ref 1.15–1.40)
Calcium, Ion: 1.13 mmol/L — ABNORMAL LOW (ref 1.15–1.40)
Calcium, Ion: 1.08 mmol/L — ABNORMAL LOW (ref 1.15–1.40)
Calcium, Ion: 1.05 mmol/L — ABNORMAL LOW (ref 1.15–1.40)
Calcium, Ion: 1.02 mmol/L — ABNORMAL LOW (ref 1.15–1.40)
HCT: 34 % — ABNORMAL LOW (ref 39.0–52.0)
HCT: 34 % — ABNORMAL LOW (ref 39.0–52.0)
HCT: 35 % — ABNORMAL LOW (ref 39.0–52.0)
HCT: 32 % — ABNORMAL LOW (ref 39.0–52.0)
HCT: 33 % — ABNORMAL LOW (ref 39.0–52.0)
Hemoglobin: 11.6 g/dL — ABNORMAL LOW (ref 13.0–17.0)
Hemoglobin: 11.6 g/dL — ABNORMAL LOW (ref 13.0–17.0)
Hemoglobin: 11.9 g/dL — ABNORMAL LOW (ref 13.0–17.0)
Hemoglobin: 10.9 g/dL — ABNORMAL LOW (ref 13.0–17.0)
Hemoglobin: 11.2 g/dL — ABNORMAL LOW (ref 13.0–17.0)
O2 Saturation: 80 %
O2 Saturation: 94 %
O2 Saturation: 86 %
O2 Saturation: 93 %
O2 Saturation: 88 %
Patient temperature: 36.9
Potassium: 4.5 mmol/L (ref 3.5–5.1)
Potassium: 4.9 mmol/L (ref 3.5–5.1)
Potassium: 6.1 mmol/L — ABNORMAL HIGH (ref 3.5–5.1)
Potassium: 5.9 mmol/L — ABNORMAL HIGH (ref 3.5–5.1)
Potassium: 6.2 mmol/L — ABNORMAL HIGH (ref 3.5–5.1)
Sodium: 147 mmol/L — ABNORMAL HIGH (ref 135–145)
Sodium: 146 mmol/L — ABNORMAL HIGH (ref 135–145)
Sodium: 144 mmol/L (ref 135–145)
Sodium: 145 mmol/L (ref 135–145)
Sodium: 143 mmol/L (ref 135–145)
TCO2: 23 mmol/L (ref 22–32)
TCO2: 30 mmol/L (ref 22–32)
TCO2: 30 mmol/L (ref 22–32)
TCO2: 33 mmol/L — ABNORMAL HIGH (ref 22–32)
TCO2: 27 mmol/L (ref 22–32)
pCO2 arterial: 33.6 mmHg (ref 32.0–48.0)
pCO2 arterial: 85 mmHg (ref 32.0–48.0)
pCO2 arterial: 108.7 mmHg (ref 32.0–48.0)
pCO2 arterial: 85.1 mmHg (ref 32.0–48.0)
pCO2 arterial: 45.9 mmHg (ref 32.0–48.0)
pH, Arterial: 7.42 (ref 7.350–7.450)
pH, Arterial: 7.124 — CL (ref 7.350–7.450)
pH, Arterial: 7.006 — CL (ref 7.350–7.450)
pH, Arterial: 7.167 — CL (ref 7.350–7.450)
pH, Arterial: 7.36 (ref 7.350–7.450)
pO2, Arterial: 43 mmHg — ABNORMAL LOW (ref 83.0–108.0)
pO2, Arterial: 96 mmHg (ref 83.0–108.0)
pO2, Arterial: 79 mmHg — ABNORMAL LOW (ref 83.0–108.0)
pO2, Arterial: 91 mmHg (ref 83.0–108.0)
pO2, Arterial: 58 mmHg — ABNORMAL LOW (ref 83.0–108.0)

## 2021-12-18 LAB — RENAL FUNCTION PANEL
Albumin: 1.5 g/dL — ABNORMAL LOW (ref 3.5–5.0)
Albumin: 1.5 g/dL — ABNORMAL LOW (ref 3.5–5.0)
Anion gap: 14 (ref 5–15)
Anion gap: 14 (ref 5–15)
BUN: 149 mg/dL — ABNORMAL HIGH (ref 8–23)
BUN: 127 mg/dL — ABNORMAL HIGH (ref 8–23)
CO2: 21 mmol/L — ABNORMAL LOW (ref 22–32)
CO2: 24 mmol/L (ref 22–32)
Calcium: 7.6 mg/dL — ABNORMAL LOW (ref 8.9–10.3)
Calcium: 7.6 mg/dL — ABNORMAL LOW (ref 8.9–10.3)
Chloride: 112 mmol/L — ABNORMAL HIGH (ref 98–111)
Chloride: 108 mmol/L (ref 98–111)
Creatinine, Ser: 4.62 mg/dL — ABNORMAL HIGH (ref 0.61–1.24)
Creatinine, Ser: 4.06 mg/dL — ABNORMAL HIGH (ref 0.61–1.24)
GFR, Estimated: 13 mL/min — ABNORMAL LOW (ref >60)
GFR, Estimated: 16 mL/min — ABNORMAL LOW (ref >60)
Glucose, Bld: 177 mg/dL — ABNORMAL HIGH (ref 70–99)
Glucose, Bld: 159 mg/dL — ABNORMAL HIGH (ref 70–99)
Phosphorus: 5 mg/dL — ABNORMAL HIGH (ref 2.5–4.6)
Phosphorus: 8.3 mg/dL — ABNORMAL HIGH (ref 2.5–4.6)
Potassium: 4.7 mmol/L (ref 3.5–5.1)
Potassium: 6.3 mmol/L (ref 3.5–5.1)
Sodium: 147 mmol/L — ABNORMAL HIGH (ref 135–145)
Sodium: 146 mmol/L — ABNORMAL HIGH (ref 135–145)

## 2021-12-18 LAB — ECHOCARDIOGRAM LIMITED
AR max vel: 3.02 cm2
AV Area VTI: 3.53 cm2
AV Area mean vel: 2.83 cm2
AV Peak grad: 10.8 mmHg
Ao pk vel: 1.64 m/s
Height: 70 in
S' Lateral: 2.3 cm
Weight: 2592.61 oz

## 2021-12-18 LAB — GLUCOSE, CAPILLARY
Glucose-Capillary: 151 mg/dL — ABNORMAL HIGH (ref 70–99)
Glucose-Capillary: 127 mg/dL — ABNORMAL HIGH (ref 70–99)
Glucose-Capillary: 126 mg/dL — ABNORMAL HIGH (ref 70–99)
Glucose-Capillary: 145 mg/dL — ABNORMAL HIGH (ref 70–99)
Glucose-Capillary: 132 mg/dL — ABNORMAL HIGH (ref 70–99)

## 2021-12-18 LAB — CBC
HCT: 33.6 % — ABNORMAL LOW (ref 39.0–52.0)
Hemoglobin: 10.8 g/dL — ABNORMAL LOW (ref 13.0–17.0)
MCH: 27.1 pg (ref 26.0–34.0)
MCHC: 32.1 g/dL (ref 30.0–36.0)
MCV: 84.4 fL (ref 80.0–100.0)
Platelets: 144 10*3/uL — ABNORMAL LOW (ref 150–400)
RBC: 3.98 MIL/uL — ABNORMAL LOW (ref 4.22–5.81)
RDW: 15.9 % — ABNORMAL HIGH (ref 11.5–15.5)
WBC: 39.5 10*3/uL — ABNORMAL HIGH (ref 4.0–10.5)
nRBC: 1.7 % — ABNORMAL HIGH (ref 0.0–0.2)

## 2021-12-18 LAB — MAGNESIUM: Magnesium: 2.9 mg/dL — ABNORMAL HIGH (ref 1.7–2.4)

## 2021-12-18 LAB — SODIUM
Sodium: 146 mmol/L — ABNORMAL HIGH (ref 135–145)
Sodium: 145 mmol/L (ref 135–145)

## 2021-12-18 LAB — EXPECTORATED SPUTUM ASSESSMENT W GRAM STAIN, RFLX TO RESP C

## 2021-12-18 LAB — VANCOMYCIN, RANDOM: Vancomycin Rm: 20

## 2021-12-18 MED ORDER — SODIUM CHLORIDE 0.9 % IV SOLN
0.0000 ug/kg/min | INTRAVENOUS | Status: DC
  Administered 2021-12-18 – 2021-12-19 (×2): 3 ug/kg/min via INTRAVENOUS
  Filled 2021-12-18 (×4): qty 20

## 2021-12-18 MED ORDER — SODIUM BICARBONATE 8.4 % IV SOLN
INTRAVENOUS | Status: AC
  Administered 2021-12-18: 100 meq via INTRAVENOUS
  Filled 2021-12-18: qty 100

## 2021-12-18 MED ORDER — VECURONIUM BROMIDE 10 MG IV SOLR
10.0000 mg | Freq: Once | INTRAVENOUS | Status: AC
  Administered 2021-12-18: 10 mg via INTRAVENOUS
  Filled 2021-12-18: qty 10

## 2021-12-18 MED ORDER — MIDAZOLAM-SODIUM CHLORIDE 100-0.9 MG/100ML-% IV SOLN
0.5000 mg/h | INTRAVENOUS | Status: DC
  Administered 2021-12-18: 1 mg/h via INTRAVENOUS
  Administered 2021-12-19: 4 mg/h via INTRAVENOUS
  Filled 2021-12-18 (×2): qty 100

## 2021-12-18 MED ORDER — SODIUM CHLORIDE 0.9 % IV SOLN
2.0000 g | Freq: Two times a day (BID) | INTRAVENOUS | Status: DC
  Administered 2021-12-18 – 2021-12-19 (×4): 2 g via INTRAVENOUS
  Filled 2021-12-18 (×4): qty 2

## 2021-12-18 MED ORDER — PHENYLEPHRINE HCL-NACL 20-0.9 MG/250ML-% IV SOLN
0.0000 ug/min | INTRAVENOUS | Status: DC
  Administered 2021-12-18: 20 ug/min via INTRAVENOUS
  Administered 2021-12-18: 60 ug/min via INTRAVENOUS
  Filled 2021-12-18 (×2): qty 250

## 2021-12-18 MED ORDER — AMIODARONE IV BOLUS ONLY 150 MG/100ML
150.0000 mg | Freq: Once | INTRAVENOUS | Status: AC
  Administered 2021-12-18: 150 mg via INTRAVENOUS

## 2021-12-18 MED ORDER — ADENOSINE 6 MG/2ML IV SOLN
INTRAVENOUS | Status: AC
  Filled 2021-12-18: qty 2

## 2021-12-18 MED ORDER — METOPROLOL TARTRATE 5 MG/5ML IV SOLN
5.0000 mg | Freq: Once | INTRAVENOUS | Status: AC
  Administered 2021-12-18: 5 mg via INTRAVENOUS

## 2021-12-18 MED ORDER — ADENOSINE 6 MG/2ML IV SOLN
6.0000 mg | Freq: Once | INTRAVENOUS | Status: AC
  Administered 2021-12-18: 6 mg via INTRAVENOUS

## 2021-12-18 MED ORDER — ARTIFICIAL TEARS OPHTHALMIC OINT
1.0000 "application " | TOPICAL_OINTMENT | Freq: Three times a day (TID) | OPHTHALMIC | Status: DC
  Administered 2021-12-18 – 2021-12-19 (×4): 1 via OPHTHALMIC
  Filled 2021-12-18: qty 3.5

## 2021-12-18 MED ORDER — SODIUM CHLORIDE 0.9 % IV SOLN: INTRAVENOUS | Status: DC | PRN

## 2021-12-18 MED ORDER — PANTOPRAZOLE SODIUM 40 MG IV SOLR
40.0000 mg | Freq: Every day | INTRAVENOUS | Status: DC
  Administered 2021-12-18 – 2021-12-19 (×2): 40 mg via INTRAVENOUS
  Filled 2021-12-18 (×2): qty 40

## 2021-12-18 MED ORDER — CHLORHEXIDINE GLUCONATE 0.12 % MT SOLN
OROMUCOSAL | Status: AC
  Administered 2021-12-18: 15 mL via OROMUCOSAL
  Filled 2021-12-18: qty 15

## 2021-12-18 MED ORDER — ADENOSINE 6 MG/2ML IV SOLN
INTRAVENOUS | Status: AC
  Filled 2021-12-18: qty 4

## 2021-12-18 MED ORDER — PRISMASOL BGK 0/2.5 32-2.5 MEQ/L EC SOLN
Status: DC
  Filled 2021-12-18 (×3): qty 5000

## 2021-12-18 MED ORDER — AMIODARONE HCL IN DEXTROSE 360-4.14 MG/200ML-% IV SOLN
INTRAVENOUS | Status: AC
  Filled 2021-12-18: qty 200

## 2021-12-18 MED ORDER — ADENOSINE 6 MG/2ML IV SOLN
12.0000 mg | Freq: Once | INTRAVENOUS | Status: AC
  Administered 2021-12-18: 12 mg via INTRAVENOUS
  Filled 2021-12-18: qty 4

## 2021-12-18 MED ORDER — CALCIUM GLUCONATE-NACL 1-0.675 GM/50ML-% IV SOLN
1.0000 g | Freq: Once | INTRAVENOUS | Status: AC
  Administered 2021-12-18: 1000 mg via INTRAVENOUS
  Filled 2021-12-18: qty 50

## 2021-12-18 MED ORDER — SODIUM BICARBONATE 8.4 % IV SOLN: 100.0000 meq | Freq: Once | INTRAVENOUS | Status: AC

## 2021-12-18 MED ORDER — AMIODARONE HCL IN DEXTROSE 360-4.14 MG/200ML-% IV SOLN
60.0000 mg/h | INTRAVENOUS | Status: AC
  Administered 2021-12-18 (×2): 60 mg/h via INTRAVENOUS
  Filled 2021-12-18 (×2): qty 200

## 2021-12-18 MED ORDER — METOPROLOL TARTRATE 5 MG/5ML IV SOLN
INTRAVENOUS | Status: AC
  Filled 2021-12-18: qty 5

## 2021-12-18 MED ORDER — AMIODARONE LOAD VIA INFUSION
150.0000 mg | Freq: Once | INTRAVENOUS | Status: AC
  Administered 2021-12-18: 150 mg via INTRAVENOUS
  Filled 2021-12-18: qty 83.34

## 2021-12-18 MED ORDER — AMIODARONE HCL IN DEXTROSE 360-4.14 MG/200ML-% IV SOLN
30.0000 mg/h | INTRAVENOUS | Status: DC
  Administered 2021-12-18 – 2021-12-19 (×3): 30 mg/h via INTRAVENOUS
  Filled 2021-12-18 (×2): qty 200

## 2021-12-18 MED ORDER — VASOPRESSIN 20 UNITS/100 ML INFUSION FOR SHOCK
0.0000 [IU]/min | INTRAVENOUS | Status: DC
  Administered 2021-12-18 – 2021-12-19 (×4): 0.03 [IU]/min via INTRAVENOUS
  Filled 2021-12-18 (×4): qty 100

## 2021-12-18 MED ORDER — VANCOMYCIN HCL 500 MG/100ML IV SOLN
500.0000 mg | INTRAVENOUS | Status: DC
  Administered 2021-12-18 – 2021-12-19 (×2): 500 mg via INTRAVENOUS
  Filled 2021-12-18 (×2): qty 100

## 2021-12-18 MED ORDER — PRISMASOL BGK 0/2.5 32-2.5 MEQ/L EC SOLN
Status: DC
  Filled 2021-12-18 (×4): qty 5000

## 2021-12-18 MED ORDER — SODIUM CHLORIDE 3 % IV SOLN
INTRAVENOUS | Status: DC
  Filled 2021-12-18 (×3): qty 500

## 2021-12-18 MED ORDER — SODIUM CHLORIDE 0.9 % IV BOLUS
1000.0000 mL | Freq: Once | INTRAVENOUS | Status: AC
  Administered 2021-12-18: 1000 mL via INTRAVENOUS

## 2021-12-18 MED ORDER — CISATRACURIUM BOLUS VIA INFUSION
10.0000 mg | Freq: Once | INTRAVENOUS | Status: AC
  Administered 2021-12-18: 10 mg via INTRAVENOUS
  Filled 2021-12-18: qty 10

## 2021-12-18 MED ORDER — DIGOXIN 0.25 MG/ML IJ SOLN
0.5000 mg | Freq: Once | INTRAMUSCULAR | Status: AC
  Administered 2021-12-18: 0.5 mg via INTRAVENOUS
  Filled 2021-12-18: qty 2

## 2021-12-18 NOTE — Consult Note (Addendum)
Brief ECMO consult note  Consulting physician: [place]  Case reviewed and patient examined.  Found unresponsive in hotel room, thalamic bleed noted on CT with midline shift. Hospital course complicated by renal failure and today progressive hemodynamic perturbation along with profound acidemia/hypoxemia despite CRRT and max vent settings.  RESP score -9 (18% pred survival)  On exam, comatose, bilateral breath sounds.  Vent dysnchrony fixed with NMB.   In light of renal failure and basal ganglia intracranial hemorrhage, I do not think he is a VV ECMO candidate and family should be brought in to say goodbye.  Discussed with primary.  Erskine Emery MD PCCM

## 2021-12-18 NOTE — Progress Notes (Signed)
[place] Progress Note Patient Name: NIXXON FARIA DOB: 20-Feb-1956 MRN: [id number]   Date of Service  12/18/2021  HPI/Events of Note  Patient remains in atrial fibrillation with RVR, rate 150 - 180, QTC 434.  eICU Interventions  A repeat bolus of Amiodarone 150 mg iv ordered.        Frederik Pear 12/18/2021, 6:34 AM

## 2021-12-18 NOTE — Progress Notes (Signed)
ETT cuff with slow leak noted.  Tube exchanged by Dr. Silas Flood over a tube exchanger without difficulty.  A #7.5 ETT was placed into the airway and secured at 26 cm at the lip.  Positive color change noted with EZ cap CO2 detector and bilateral breath sounds are present.

## 2021-12-18 NOTE — Progress Notes (Addendum)
Pharmacy Antibiotic Note  Darryl Parsons is a 66 y.o. male admitted on 12/14/2021 after being found down.  CT shows aspiration vs multilobar PNA with small LUL cavitation.  Also has E.coli UTI and Staph bacteremia.  Pharmacy has been consulted for vancomycin and Unasyn dosing.  Patient was started on CRRT with interruption on 2/2.  Vancomycin random level appropriate this AM at 20 mcg/mL.  Tmax 100.4, WBC up to 39.5; urine output picking up.  Plan: Vanc 500mg  IV Q24H Unasyn 3gm IV Q8H Monitor CRRT interruption/tolerance, micro data   Height: 5\' 10"  (177.8 cm) Weight: 75.3 kg (166 lb 0.1 oz) IBW/kg (Calculated) : 73  Temp (24hrs), Avg:99.1 F (37.3 C), Min:97.9 F (36.6 C), Max:100.8 F (38.2 C)  Recent Labs  Lab 12/05/2021 1616 11/20/2021 1627 11/26/2021 1926 12/16/21 0346 12/16/21 1553 12/17/21 0358 12/17/21 1550 12/18/21 0422  WBC 12.7*  --   --  19.7*  --  27.6*  --  39.5*  CREATININE 7.04*   < >  --  6.88* 6.95* 5.49* 5.92* 4.62*  LATICACIDVEN 6.6*  --  3.5*  --   --   --   --   --   VANCORANDOM  --   --   --   --   --   --   --  20   < > = values in this interval not displayed.     Estimated Creatinine Clearance: 16.7 mL/min (A) (by C-G formula based on SCr of 4.62 mg/dL (H)).    No Known Allergies  Unasyn 1/31 >> Vanc 1/31 >> Cefepime x1 1/31   2/3 VR = 20 mcg/mL (checked b/c off CRRT ~7 hrs on 2/2) >> con't 500mg  q24   1/31 MRSA PCR - positive 1/31 UCx - E.coli (pan sensitive) 1/31 BCx - GPC (BCID 1/3 Staph spp)  Mariana Goytia D. Mina Marble, PharmD, BCPS, [place] 12/18/2021, 7:22 AM  =========================  Addendum: Broaden from Unasyn to Cefepime to cover for pseudomonas given decompensation Cefepime 2gm IV Q12H  Jamahl Lemmons D. Mina Marble, PharmD, BCPS, [place] 12/18/2021, 11:14 AM

## 2021-12-18 NOTE — Procedures (Signed)
Central Venous Catheter Insertion Procedure Note  Darryl Parsons  [id number]  [birth year]  Date:12/18/21  Time:5:58 PM   Provider Performing:Michail Boyte E Rachard Isidro   Procedure: Insertion of Non-tunneled Central Venous Catheter(36556)with US guidance (38453)    Indication(s) Hemodialysis  Consent Unable to obtain consent due to inability to find a medical decision maker for patient.  All reasonable efforts were made.  Another independent medical provider, Dr. Silas Parsons , confirmed the benefits of this procedure outweigh the risks.  Anesthesia Continuous fentanyl, propofol, versed   Timeout Verified patient identification, verified procedure, site/side was marked, verified correct patient position, special equipment/implants available, medications/allergies/relevant history reviewed, required imaging and test results available.  Sterile Technique Maximal sterile technique including full sterile barrier drape, hand hygiene, sterile gown, sterile gloves, mask, hair covering, sterile ultrasound probe cover (if used).  Procedure Description Area of catheter insertion was cleaned with chlorhexidine and draped in sterile fashion.   With real-time ultrasound guidance a 15 cm HD catheter was placed into the left femoral vein, as there were not 20cm catheters available and patient becomes hypoxemic with laying flat.  Placement of guidewire within left femoral vein was verified with [place] x 2 views prior to dilation and placement of catheter. Nonpulsatile blood flow and easy flushing noted in all ports.  The catheter was sutured in place and sterile dressing applied.  Complications/Tolerance None; patient tolerated the procedure well.  x-ray is not ordered for femoral cannulation.  EBL Minimal  Specimen(s) None    [name] MSN, AGACNP-BC [place] for pager 12/18/2021, 6:00 PM

## 2021-12-18 NOTE — Progress Notes (Addendum)
PCCM INTERVAL PROGRESS NOTE  Called to bedside to evaluate patient in with narrow complex tachycardia. Briefly this is a 66 year old male who presented 2 days ago with ICH felt to be secondary to hypertension with prolonged period down resulting in rhabdomyolysis and acute renal failure requiring CRRT. Per RN the patient had been staying tachycardic in as high as 120, but was sinus on the monitor, prior to having a rapid increase to 180s 190s. Narrow complex. Hemodynamics remained intact. 6mg  adenosine given by EMD with no response. At this time myself and Dr. Halford Chessman arrived at bedside. 12mg  adenosine given. Rhythm slowed and became apparent as atrial fibrillation. Amiodarone bolus given with minimal response. Infusion continued. Metoprolol 5 given with modest response. Rates down to 130s. CXR nonacute.   Plan: - continue amiodarone infusion - Levo > neo - Send AM labs now, follow for electrolytes - Place back on full vent support. Only able to maintain synchrony in pressure control - ABG with hypoxemia. FiO2 and PEEP increased.  - EKG     Georgann Housekeeper, AGACNP-BC [facility]  See Amion for personal pager PCCM on call pager [phone] until 7pm. Please call Elink 7p-7a. [phone]  12/18/2021 4:34 AM

## 2021-12-18 NOTE — Progress Notes (Signed)
NAME:  Darryl Parsons, MRN:  [id number], DOB:  Mar 31, 1956, LOS: 3 ADMISSION DATE:  12/06/2021, CONSULTATION DATE:  12/18/21 REFERRING MD:  Darl Householder, CHIEF COMPLAINT:  unresponsive   History of Present Illness:  66 year old male with prior history of schizophrenia, cocaine abuse, CVA, and CKD who presented to ER as level 1 trauma.  Patient found unresponsive and agonal in a hotel room between the wall and bed with significant swelling to left eye and forehead and pressure wound on left exterior thigh with pinpoint pupils and right gaze.  Last seen 2 days ago.  Given narcan with no change.  On arrival to ER, he was hypotensive 84/50, HR 130, and was intubated on arrival to ER and underwent trauma evaluation.  Found on workup to have acute right basal ganglia hemorrhage with mild edema and left MLS 91mm with smaller acute left basal ganglia hemorrhages, and acute vs subacute left thalamic infarcts, AKI with concern for rhabdo, lactic acidosis, and suspected aspiration pneumonia. He is requiring levophed for ongoing hypotension.  Neuro consulted and PCCM called for admit.   Pertinent  Medical History   has no past medical history on file.   Duplicate chart MRN: [id number]  Visual hallucinations Bipolar/Depression, ?major Cocaine abuse HTn CKD1 Prior GIB in the past 2/2 to internal hemorrhoids Recent admit to [facility] 10.12.--10.17 Has a repeated pattern of OD and ingestion and reported self-harm  Significant Hospital Events: Including procedures, antibiotic start and stop dates in addition to other pertinent events   1/31 Brought in by EMS, intubated, found to have [place], neuro consulted, on cEEG, unasyn started, on HTS    1/31 SARS/ flu > neg 1/31 Bcx2 >  1/31 UC >  1/31 MRSA PCR > positive   1/31 vanc 1/31 cefepime x1 1/31 unasyn >   Interim History / Subjective:   Overnight, worsening hypoxemia, new Afib RVR with associated hypotension  Put on PCV, Received adenosine 6mg , followed by  12mg , amio, metop, and started on dig   NE changed to neo   Worse hypoxemia, PaO2 40s   Objective   Blood pressure 116/73, pulse 88, temperature 97.7 F (36.5 C), resp. rate 16, height 5\' 10"  (1.778 m), weight 75.3 kg, SpO2 90 %.    Vent Mode: PCV FiO2 (%):  [60 %-100 %] 100 % Set Rate:  [20 bmp] 20 bmp PEEP:  [10 cmH20] 10 cmH20 Pressure Support:  [8 cmH20] 8 cmH20 Plateau Pressure:  [18 cmH20-27 cmH20] 19 cmH20   Intake/Output Summary (Last 24 hours) at 12/18/2021 1124 Last data filed at 12/18/2021 1100 Gross per 24 hour  Intake 3360.41 ml  Output 1889 ml  Net 1471.41 ml   Filed Weights   11/24/2021 1705 11/26/2021 1800 12/17/21 0500  Weight: 56.7 kg 56.7 kg 75.3 kg    General:  Critically ill adult M intubated sedated  HEENT: L face abrasion, bruising. Mild periorbital edema. ETT secure. Anicteric sclera Neuro:   Sedated. Does not follow commands. Grimace to pain  CV: irir s1s2 cap refill brisk  PULM:  Diminished bibasilar sounds, R sided rhonchi  GI: Soft distended. + bowel sounds. BMS.   GU: condom cath, yellow urine  Extremities: L hip edema, dressing intact. No cyanosis or clubbing  Skin: L hip, abdomen, arm bruising and abrasion.   Resolved Hospital Problem list     Assessment & Plan:   Suspected acute metabolic encephalopathy due to renal failure, polysubstance use, infection  -no evidence of sz -amphetamines in UDS -ammonia  mildly elevated, unlikely to cause HE  P -metabolic correction as below  -eventually, TOC for cessation support if clinically becomes appropriate   Acute R basal ganglia and L basal ganglia ICH Subacute thalamic infarct Cerebral edema, midline shift   P -Neuro following -SBP goal < 140   Acute hypoxic respiratory failure L PNA with LUL cavitation -- aspiration vs CAP  Concern for PE (2/3)  -2/3 night acute SVT then Afiv RVR, associated hypoxemia -CXR 2/3 with worsening opacities.  P -Incr sedation to RASS -4/-5 -PRN NMB -Cont MV  support -send tracheal aspirate  -if stable will consider sending for CTA chest r/o PE. Dye load presents risk with AKI. Not sure we would have great options for tx PE if found to have PE -will order a STAT echo     A.fib RVR -likely driven by acute hypoxemia P -Cont amio  -Mag goal >2 K goal > 4 -ICU monitoring -hypoxia as above   Suspected septic shock  -vs obstructive (possible PE?)  -vs medication related (seemed to worsen with overnight sedation increase). Doubt hypovolemia.  -staph capitis in anaerobic bottle only.  -urine with >100000 ecoli  P -possible PE as above  -MAP goal > 65, wean pressors as able  -broadening abx coverage to include cefepime -follow micro data   AKI AGMA, improving  Mild Rhabdomyolysis  P -CRRT per nephro -trend renal function panel   Iatrogenic hypernatremia Hypocalcemia P -replace Ca -trend BMP  Hx HTN -holding home meds with shock   Elevated LFTs, mild - trend   Hyperglycemia  - likely stress response P -sSSI  Multiple pressure wounds, POA  - WOC, hydrotherapy   Best Practice (right click and "Reselect all SmartList Selections" daily)   Diet/type: NPO, starting TF 2/1 DVT prophylaxis: SCD GI prophylaxis: PPI Lines: N/A s/p R IJ trialysis temp HD cath Foley:  Yes, and it is still needed Code Status:  full code Last date of multidisciplinary goals of care discussion [no family available for contact].  Apparently has lived in hotel room for 5+ years and prior notes report living conditions very poor (roof caving in, bed bugs, snakes in room).  No known NOK.  Will consult TOC to help with these issues, dispo will depend on hospital course  Labs   CBC: Recent Labs  Lab 12/01/2021 1616 11/15/2021 1627 12/16/21 0346 12/16/21 0753 12/17/21 0358 12/17/21 0413 12/18/21 0422 12/18/21 0434  WBC 12.7*  --  19.7*  --  27.6*  --  39.5*  --   HGB 16.5   < > 13.5 11.6* 11.9* 15.6 10.8* 11.6*  HCT 50.3   < > 39.7 34.0* 36.0* 46.0  33.6* 34.0*  MCV 83.4  --  80.0  --  81.1  --  84.4  --   PLT 238  --  175  --  188  --  144*  --    < > = values in this interval not displayed.    Basic Metabolic Panel: Recent Labs  Lab 12/16/21 0346 12/16/21 0753 12/16/21 1553 12/16/21 2155 12/17/21 0358 12/17/21 0413 12/17/21 0857 12/17/21 1550 12/17/21 2237 12/18/21 0422 12/18/21 0434 12/18/21 1027  NA 152*   < > 153*   155*   < > 146* 149*   < > 149*   151* 149* 147* 147* 146*  K 4.3   < > 4.3  --  4.7 4.6  --  5.0  --  4.7 4.5  --   CL 114*  --  117*  --  117*  --   --  114*  --  112*  --   --   CO2 20*  --  19*  --  18*  --   --  20*  --  21*  --   --   GLUCOSE 145*  --  163*  --  171*  --   --  175*  --  177*  --   --   BUN 209*  --  211*  --  176*  --   --  174*  --  149*  --   --   CREATININE 6.88*  --  6.95*  --  5.49*  --   --  5.92*  --  4.62*  --   --   CALCIUM 7.5*  --  7.8*  --  7.4*  --   --  7.5*  --  7.6*  --   --   MG 3.4*  --   --   --  3.2*  --   --   --   --  2.9*  --   --   PHOS 6.4*  --  5.1*  --  4.4  --   --  5.6*  --  5.0*  --   --    < > = values in this interval not displayed.   GFR: Estimated Creatinine Clearance: 16.7 mL/min (A) (by C-G formula based on SCr of 4.62 mg/dL (H)). Recent Labs  Lab 11/24/2021 1616 11/29/2021 1926 12/16/21 0346 12/17/21 0358 12/18/21 0422  WBC 12.7*  --  19.7* 27.6* 39.5*  LATICACIDVEN 6.6* 3.5*  --   --   --     Liver Function Tests: Recent Labs  Lab 12/13/2021 1616 12/16/21 0346 12/16/21 1553 12/17/21 0358 12/17/21 1550 12/18/21 0422  AST 46* 93*  --  110*  --   --   ALT 22 57*  --  71*  --   --   ALKPHOS 53 38  --  44  --   --   BILITOT 1.5* 1.2  --  1.8*  --   --   PROT 6.9 5.1*  --  5.4*  --   --   ALBUMIN 2.3* 1.7* 1.7* 1.6*   1.5* 1.6* 1.5*   No results for input(s): LIPASE, AMYLASE in the last 168 hours. Recent Labs  Lab 12/16/21 0346 12/17/21 0358  AMMONIA 43* 46*    ABG    Component Value Date/Time   PHART 7.420 12/18/2021 0434    PCO2ART 33.6 12/18/2021 0434   PO2ART 43 (L) 12/18/2021 0434   HCO3 21.8 12/18/2021 0434   TCO2 23 12/18/2021 0434   ACIDBASEDEF 2.0 12/18/2021 0434   O2SAT 80.0 12/18/2021 0434     Coagulation Profile: Recent Labs  Lab 11/16/2021 1616  INR 1.4*    Cardiac Enzymes: Recent Labs  Lab 12/12/2021 1616 12/16/21 0346 12/17/21 0358  CKTOTAL 1,079* 992* 902*  CKMB 21.1*  --   --     HbA1C: Hgb A1c MFr Bld  Date/Time Value Ref Range Status  11/29/2021 07:26 PM 4.2 (L) 4.8 - 5.6 % Final    Comment:    (NOTE)         Prediabetes: 5.7 - 6.4         Diabetes: >6.4         Glycemic control for adults with diabetes: <7.0     CBG: Recent Labs  Lab 12/17/21 1914 12/17/21 2308 12/18/21 0307 12/18/21  4591 12/18/21 1122  GLUCAP 151* 150* 151* 127* 126*    CRITICAL CARE Performed by: Cristal Generous   Total critical care time: 56 minutes  Critical care time was exclusive of separately billable procedures and treating other patients. Critical care was necessary to treat or prevent imminent or life-threatening deterioration.  Critical care was time spent personally by me on the following activities: development of treatment plan with patient and/or surrogate as well as nursing, discussions with consultants, evaluation of patient's response to treatment, examination of patient, obtaining history from patient or surrogate, ordering and performing treatments and interventions, ordering and review of laboratory studies, ordering and review of radiographic studies, pulse oximetry and re-evaluation of patient's condition.  [name] MSN, AGACNP-BC [place] for pager  12/18/2021, 11:24 AM

## 2021-12-18 NOTE — Procedures (Signed)
Arterial Catheter Insertion Procedure Note  Darryl Parsons  [id number]  [birth year]  Date:12/18/21  Time:2:22 PM    Provider Performing: Candee Furbish    Procedure: Insertion of Arterial Line [phone]) with US guidance (67619)   Indication(s) Blood pressure monitoring and/or need for frequent ABGs  Consent Unable to obtain consent due to emergent nature of procedure.  Anesthesia None   Time Out Verified patient identification, verified procedure, site/side was marked, verified correct patient position, special equipment/implants available, medications/allergies/relevant history reviewed, required imaging and test results available.   Sterile Technique Maximal sterile technique including full sterile barrier drape, hand hygiene, sterile gown, sterile gloves, mask, hair covering, sterile ultrasound probe cover (if used).   Procedure Description Area of catheter insertion was cleaned with chlorhexidine and draped in sterile fashion. With real-time ultrasound guidance an arterial catheter was placed into the left radial artery.  Appropriate arterial tracings confirmed on monitor.     Complications/Tolerance None; patient tolerated the procedure well.   EBL Minimal   Specimen(s) None

## 2021-12-18 NOTE — Progress Notes (Signed)
[facility] Progress Note Patient Name: Darryl Parsons DOB: 24-Feb-1956 MRN: [id number]   Date of Service  12/18/2021  HPI/Events of Note  Patient with narrow complex tachycardia with rates up to 200, it initially appeared regular c/w SVT so Adenosine 6 mg iv ordered, rhythm failed to break, 12 lead EKG was done and indicated atrial fibrillation with RVR, Amiodarone bolus + infusion ordered. Arrhythmia was not preceded by desaturation, patient is on CRRT, he did maintain his blood pressure.  eICU Interventions  PCCM ground crew arrived and took over care.        Kerry Kass Dorise Gangi 12/18/2021, 4:07 AM

## 2021-12-18 NOTE — Progress Notes (Signed)
Rt attempted to place a-line x2 w/o success. Flash both times but flow immediately stopped. RT will cont to monitor.

## 2021-12-18 NOTE — Progress Notes (Signed)
SLP Cancellation Note  Patient Details Name: Darryl Parsons MRN: [id number] DOB: Oct 08, 1956   Cancelled treatment:       Reason Eval/Treat Not Completed: Medical issues which prohibited therapy (remains on vent this am). Will continue to follow.     Osie Bond., M.A. [facility] Acute Rehabilitation Services Pager [phone] Office [phone]  12/18/2021, 8:01 AM

## 2021-12-18 NOTE — Progress Notes (Signed)
°  Amiodarone Drug - Drug Interaction Consult Note  Recommendations: No specific interactions at this time. Amiodarone is metabolized by the cytochrome P450 system and therefore has the potential to cause many drug interactions. Amiodarone has an average plasma half-life of 50 days (range 20 to 100 days).   There is potential for drug interactions to occur several weeks or months after stopping treatment and the onset of drug interactions may be slow after initiating amiodarone.   []  Statins: Increased risk of myopathy. Simvastatin- restrict dose to 20mg  daily. Other statins: counsel patients to report any muscle pain or weakness immediately.  []  Anticoagulants: Amiodarone can increase anticoagulant effect. Consider warfarin dose reduction. Patients should be monitored closely and the dose of anticoagulant altered accordingly, remembering that amiodarone levels take several weeks to stabilize.  []  Antiepileptics: Amiodarone can increase plasma concentration of phenytoin, the dose should be reduced. Note that small changes in phenytoin dose can result in large changes in levels. Monitor patient and counsel on signs of toxicity.  []  Beta blockers: increased risk of bradycardia, AV block and myocardial depression. Sotalol - avoid concomitant use.  []   Calcium channel blockers (diltiazem and verapamil): increased risk of bradycardia, AV block and myocardial depression.  []   Cyclosporine: Amiodarone increases levels of cyclosporine. Reduced dose of cyclosporine is recommended.  []  Digoxin dose should be halved when amiodarone is started.  []  Diuretics: increased risk of cardiotoxicity if hypokalemia occurs.  []  Oral hypoglycemic agents (glyburide, glipizide, glimepiride): increased risk of hypoglycemia. Patient's glucose levels should be monitored closely when initiating amiodarone therapy.   []  Drugs that prolong the QT interval:  Torsades de pointes risk may be increased with concurrent use -  avoid if possible.  Monitor QTc, also keep magnesium/potassium WNL if concurrent therapy can't be avoided.  Antibiotics: e.g. fluoroquinolones, erythromycin.  Antiarrhythmics: e.g. quinidine, procainamide, disopyramide, sotalol.  Antipsychotics: e.g. phenothiazines, haloperidol.   Lithium, tricyclic antidepressants, and methadone.   Wynona Neat, PharmD, BCPS 12/18/2021 4:34 AM

## 2021-12-18 NOTE — Progress Notes (Signed)
Physical Therapy Wound Treatment Patient Details  Name: Darryl Parsons MRN: [id number] Date of Birth: 01-20-1956  Today's Date: 12/18/2021 Time: 4098-1191 Time Calculation (min): 32 min  Subjective  Subjective Assessment Subjective: Pt lethargic on vent - eyes closed throughout session. Patient and Family Stated Goals: None stated Date of Onset:  (Unknown) Prior Treatments: Dressing changes  Pain Score:  Pt did not appear to be uncomfortable or in pain throughout the session.  Wound Assessment  Pressure Injury 11/21/2021 Hip Left Unstageable - Full thickness tissue loss in which the base of the injury is covered by slough (yellow, tan, gray, green or brown) and/or eschar (tan, brown or black) in the wound bed. (Active)  Dressing Type Barrier Film (skin prep);Foam - Lift dressing to assess site every shift;Gauze (Comment);Moist to moist;Santyl 12/18/21 1528  Dressing Changed;Clean;Dry;Intact 12/18/21 1528  Dressing Change Frequency Daily 12/18/21 1528  State of Healing Early/partial granulation 12/18/21 1528  Site / Wound Assessment Red;Yellow;Black 12/18/21 1528  % Wound base Red or Granulating 25% 12/18/21 1528  % Wound base Yellow/Fibrinous Exudate 60% 12/18/21 1528  % Wound base Black/Eschar 15% 12/18/21 1528  % Wound base Other/Granulation Tissue (Comment) 0% 12/18/21 1528  Peri-wound Assessment Maceration;Excoriated 12/18/21 1528  Wound Length (cm) 10.5 cm 12/16/21 1440  Wound Width (cm) 8 cm 12/16/21 1440  Wound Depth (cm) 0 cm 12/16/21 1440  Wound Surface Area (cm^2) 84 cm^2 12/16/21 1440  Wound Volume (cm^3) 0 cm^3 12/16/21 1440  Tunneling (cm) 0 12/16/21 1440  Undermining (cm) 0 12/16/21 1440  Margins Unattached edges (unapproximated) 12/18/21 1528  Drainage Amount Moderate 12/18/21 1528  Drainage Description Serosanguineous 12/18/21 1528  Treatment Debridement (Selective);Hydrotherapy (Pulse lavage);Packing (Saline gauze) 12/18/21 1528   Hydrotherapy Pulsed lavage  therapy - wound location: L hip Pulsed Lavage with Suction (psi): 8 psi Pulsed Lavage with Suction - Normal Saline Used: 1000 mL Pulsed Lavage Tip: Tip with splash shield Selective Debridement Selective Debridement - Location: L hip Selective Debridement - Tools Used: Forceps, Scalpel Selective Debridement - Tissue Removed: Eschar    Wound Assessment and Plan  Wound Therapy - Assess/Plan/Recommendations Wound Therapy - Clinical Statement: Careful debridement around prominent superficial blood vessels and noted healthy adipose in the center of the wound. Pt will benefit from continued hydrotherapy for selective removal of unviable tissue, to decrease bioburden, and promote wound bed healing. Wound Therapy - Functional Problem List: Global weakness s/p unknown time down and ICU admission Factors Delaying/Impairing Wound Healing: Immobility Hydrotherapy Plan: Debridement, Dressing change, Patient/family education, Pulsatile lavage with suction Wound Therapy - Frequency: 6X / week Wound Therapy - Follow Up Recommendations: dressing changes by RN  Wound Therapy Goals- Improve the function of patient's integumentary system by progressing the wound(s) through the phases of wound healing (inflammation - proliferation - remodeling) by: Wound Therapy Goals - Improve the function of patient's integumentary system by progressing the wound(s) through the phases of wound healing by: Decrease Necrotic Tissue to: 20% Decrease Necrotic Tissue - Progress: Progressing toward goal Increase Granulation Tissue to: 80% Increase Granulation Tissue - Progress: Progressing toward goal Time For Goal Achievement: 7 days Wound Therapy - Potential for Goals: Good  Goals will be updated until maximal potential achieved or discharge criteria met.  Discharge criteria: when goals achieved, discharge from hospital, MD decision/surgical intervention, no progress towards goals, refusal/missing three consecutive treatments  without notification or medical reason.  GP     Charges PT Wound Care Charges $Wound Debridement up to 20 cm: < or equal to 20  cm $ Wound Debridement each add'l 20 sqcm: 3 $PT PLS Gun and Tip: 1 Supply $PT Hydrotherapy Visit: 1 Visit       [name] 12/18/2021, 3:43 PM  [name], PT, DPT Acute Rehabilitation Services Pager: [phone] Office: [phone]

## 2021-12-18 NOTE — TOC Progression Note (Signed)
Transition of Care [facility]) - Progression Note    Patient Details  Name: Darryl Parsons MRN: [id number] Date of Birth: 12/04/1955  Transition of [facility]) CM/SW [place], LCSW Phone Number: 12/18/2021, 12:16 PM  Clinical Narrative:    [facility] has accepted case and will conduct investigation into decision maker for patient.         Expected Discharge Plan and Services                                                 Social Determinants of Health (SDOH) Interventions    Readmission Risk Interventions No flowsheet data found.

## 2021-12-18 NOTE — Progress Notes (Signed)
Brief Neuro Update:  Notified of unequal pupils with R pupil slightly larger than left. Both are sluggish reaction to light. He is on paralytic for Vent dyssynchrony, 2 pressors and on CRRT. Will need to get a CTH w/o contrast when he is stable. He is too sick at this time to get a CT Head.   [place] Pager Number [phone]

## 2021-12-18 NOTE — Progress Notes (Addendum)
STROKE TEAM PROGRESS NOTE   INTERVAL HISTORY Patient is seen in his room with no family at the bedside.  He remains intubated and on CRRT with no next of kin identified at this time.  His is sedated, dyssynchronous with the ventilator. CCM considering paralytics. Remains on vasopressor for blood pressure support.  His medical condition seems to have declined with patient not developing suspected ARDS versus pulmonary embolism and less likely respiratory failure now requiring paralysis to maintain synchrony with respirator and maintaining his oxygenation.  Repeat CT scan of the head done yesterday morning had shown no acute abnormality change.  Neurological exam remains limited due to heavy sedation. Vitals:   12/18/21 1115 12/18/21 1130 12/18/21 1145 12/18/21 1147  BP: (!) 152/85 (!) 153/79 128/69   Pulse: 82 87 89   Resp: 20 20 (!) 26   Temp: 99 F (37.2 C) 99.1 F (37.3 C) 99.1 F (37.3 C)   TempSrc:      SpO2: (!) 89% 94% 93% 100%  Weight:      Height:       CBC:  Recent Labs  Lab 12/17/21 0358 12/17/21 0413 12/18/21 0422 12/18/21 0434 12/18/21 1139  WBC 27.6*  --  39.5*  --   --   HGB 11.9*   < > 10.8* 11.6* 11.6*  HCT 36.0*   < > 33.6* 34.0* 34.0*  MCV 81.1  --  84.4  --   --   PLT 188  --  144*  --   --    < > = values in this interval not displayed.    Basic Metabolic Panel:  Recent Labs  Lab 12/17/21 0358 12/17/21 0413 12/17/21 1550 12/17/21 2237 12/18/21 0422 12/18/21 0434 12/18/21 1027 12/18/21 1139  NA 146*   < > 149*   151*   < > 147* 147* 146* 146*  K 4.7   < > 5.0  --  4.7 4.5  --  4.9  CL 117*  --  114*  --  112*  --   --   --   CO2 18*  --  20*  --  21*  --   --   --   GLUCOSE 171*  --  175*  --  177*  --   --   --   BUN 176*  --  174*  --  149*  --   --   --   CREATININE 5.49*  --  5.92*  --  4.62*  --   --   --   CALCIUM 7.4*  --  7.5*  --  7.6*  --   --   --   MG 3.2*  --   --   --  2.9*  --   --   --   PHOS 4.4  --  5.6*  --  5.0*  --   --    --    < > = values in this interval not displayed.    Lipid Panel:  Recent Labs  Lab 12/16/21 0346 12/17/21 0358  CHOL 110  --   TRIG 118   122 227*  HDL 31*  --   CHOLHDL 3.5  --   VLDL 24  --   LDLCALC 55  --     HgbA1c:  Recent Labs  Lab 12/13/2021 1926  HGBA1C 4.2*   Urine Drug Screen:  Recent Labs  Lab 12/16/21 0026  LABOPIA NONE DETECTED  COCAINSCRNUR NONE DETECTED  LABBENZ NONE DETECTED  AMPHETMU POSITIVE*  THCU NONE DETECTED  LABBARB NONE DETECTED     Alcohol Level  Recent Labs  Lab 12/01/2021 1616  ETH <10     IMAGING past 24 hours DG Chest Portable 1 View  Result Date: 12/18/2021 CLINICAL DATA:  Tube verification.  Endotracheal tube verification. EXAM: PORTABLE CHEST 1 VIEW COMPARISON:  Radiographs 12/18/2021 and 12/17/2021.  CT 12/09/2021. FINDINGS: 0936 hours. The endotracheal tube overlaps the enteric tube in the upper chest, although appears to extend to the level of the clavicular heads. Enteric tube projects below the diaphragm, tip not visualized. Right IJ central venous catheter is unchanged at the level of the mid SVC. Multiple overlying telemetry leads are present. The heart size and mediastinal contours are stable. Volume loss in the left hemithorax and associated airspace opacities are unchanged. There is new patchy airspace disease at the right lung base. No evidence of pneumothorax or significant pleural effusion. IMPRESSION: 1. Satisfactory position of the support system and lines. Tip of the endotracheal tube is partly obscured by overlapping enteric tube, although is located at approximately the level of the clavicular heads. 2. New right lung airspace opacities suspicious for aspiration. Previously demonstrated left lung airspace opacities have not significantly changed. Electronically Signed   By: Richardean Sale M.D.   On: 12/18/2021 09:53   DG CHEST PORT 1 VIEW  Result Date: 12/18/2021 CLINICAL DATA:  66 year old male intubated. Found down.  Right basal ganglia hemorrhage. EXAM: PORTABLE CHEST 1 VIEW COMPARISON:  Portable chest 12/17/2021 and earlier. FINDINGS: Portable AP semi upright view at 0428 hours. Stable lines and tubes. Patchy and confluent left perihilar and lung base opacity not significantly changed. Stable lung volumes. Right lung now appears clear when allowing for portable technique. Mediastinal contours remain within normal limits. No pneumothorax or pulmonary edema. No acute osseous abnormality identified. IMPRESSION: 1. Stable lines and tubes. 2. Clear right lung. Patchy and confluent left perihilar and lung base opacity not significantly changed. Electronically Signed   By: Genevie Ann M.D.   On: 12/18/2021 06:00   VAS US CAROTID  Result Date: 12/17/2021 Carotid Arterial Duplex Study Patient Name:  Darryl Parsons  Date of Exam:   12/17/2021 Medical Rec #: [id number]         Accession #:    [id number] Date of Birth: 1955-12-11         Patient Gender: M Patient Age:   44 years Exam Location:  [facility] Procedure:      VAS US CAROTID Referring Phys: Alferd Patee [facility] --------------------------------------------------------------------------------  Indications:       CVA. Risk Factors:      None. Limitations        Today's exam was limited due to a central line, patient on a                    ventilator and Right neck bandages. Comparison Study:  No prior studies. Performing Technologist: Oliver Hum RVT  Examination Guidelines: A complete evaluation includes B-mode imaging, spectral Doppler, color Doppler, and power Doppler as needed of all accessible portions of each vessel. Bilateral testing is considered an integral part of a complete examination. Limited examinations for reoccurring indications may be performed as noted.  Right Carotid Findings: +----------+--------+--------+--------+------------------+--------------+             PSV cm/s EDV cm/s Stenosis Plaque Description Comments         +----------+--------+--------+--------+------------------+--------------+  CCA Prox  Not visualized  +----------+--------+--------+--------+------------------+--------------+  CCA Distal                                               Not visualized  +----------+--------+--------+--------+------------------+--------------+  ICA Prox                                                 Not visualized  +----------+--------+--------+--------+------------------+--------------+  ICA Distal                                               Not visualized  +----------+--------+--------+--------+------------------+--------------+  ECA                                                      Not visualized  +----------+--------+--------+--------+------------------+--------------+ +----------+--------+-------+--------+-------------------+             PSV cm/s EDV cms Describe Arm Pressure (mmHG)  +----------+--------+-------+--------+-------------------+  Subclavian 80                                             +----------+--------+-------+--------+-------------------+ +---------+--------+--------+--------------+  Vertebral PSV cm/s EDV cm/s Not visualized  +---------+--------+--------+--------------+  Left Carotid Findings: +----------+--------+--------+--------+-----------------------+--------+             PSV cm/s EDV cm/s Stenosis Plaque Description      Comments  +----------+--------+--------+--------+-----------------------+--------+  CCA Prox   127      39                smooth and heterogenous           +----------+--------+--------+--------+-----------------------+--------+  CCA Distal 109      32                smooth and heterogenous           +----------+--------+--------+--------+-----------------------+--------+  ICA Prox   54       14                smooth and heterogenous           +----------+--------+--------+--------+-----------------------+--------+  ICA Distal 88       34                                         tortuous  +----------+--------+--------+--------+-----------------------+--------+  ECA        61       17                                                  +----------+--------+--------+--------+-----------------------+--------+ +----------+--------+--------+--------+-------------------+             PSV cm/s EDV cm/s Describe Arm  Pressure (mmHG)  +----------+--------+--------+--------+-------------------+  Subclavian 179                                             +----------+--------+--------+--------+-------------------+ +---------+--------+--+--------+--+---------+  Vertebral PSV cm/s 54 EDV cm/s 21 Antegrade  +---------+--------+--+--------+--+---------+   Summary: Right Carotid: Unable to assess the right sided carotid system due to the                presence of a central line and bandages. Left Carotid: Velocities in the left ICA are consistent with a 1-39% stenosis. Vertebrals: Left vertebral artery demonstrates antegrade flow. *See table(s) above for measurements and observations.  Electronically signed by Antony Contras MD on 12/17/2021 at 1:38:33 PM.    Final     PHYSICAL EXAM General: Intubated, sedated well-developed, well-nourished male with wounds on left side of face in no acute distress  Respiratory:  Respirations synchronous with ventilator  Neurological (after recent fentanyl administration): Intubated and sedated.  Eyes are closed.  Pupils 19mm, round and nonreactive. Corneal reflex intact, oculocephalic reflex absent. Cough reflex intact.  Does not follow commands and no response to noxious stimuli  ASSESSMENT/PLAN Darryl Parsons is a 66 y.o. male with history of schizophrenia, stroke, cocaine use, CKD, HTN and smoking presenting after being found down in a hotel room between the bed and the wall.  He was intubated on arrival to the ED and found to have a pressure injury to the left hip.  On CT, he was found to have a 4cm acute right basal ganglia  hemorrhage with edema and 51mm midline shift, along with smaller hemorrhages in the left basal ganglia, right thalamus and right temporal lobe.  He was also found to have an AKI with Cr of 7.04 and CK of 1079. Made DNR on 2/3 given declining status. Remains on CRRT at this time.   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 MRI  pending MRA  pending Carotid Doppler  pending 2D Echo EF 60-65%, severe LVH, grade 1 diastolic dysfunction, no atrial level shunt LDL 55 HgbA1c No results found for requested labs within last 26280 hours. VTE prophylaxis - SCDs No antithrombotic prior to admission, now on No antithrombotic secondary to IPH Therapy recommendations:  pending Disposition:  pending  Cerebral edema with midline shift 67mm right to left midline shift noted on CT 3% HTS given overnight but stopped due to Na of 155 Midline shift less prominent on follow up CT  Hypertension Home meds:  amlodipine 10 mg daily Stable Keep SBP <140 Long-term BP goal normotensive  Hyperlipidemia Home meds:  none LDL 55, goal < 70 High intensity statin not indicated as LDL below goal Continue statin at discharge  Risk for Diabetes type II  Home meds:  none HgbA1c No results found for requested labs within last 26280 hours., goal < 7.0 CBGs SSI  Respiratory failure Intubated in ED due to GCS of 3 Ventilator management per CCM  pH 7.12/ pCO2 85  AKI on CKD Cr 6.88 -> 4.62 CRRT per CCM Avoid contrast when  possible Renally dose medications as appropriate  Other Stroke Risk Factors Cigarette smoker advised to stop smoking Substance abuse - UDS:  THC NONE DETECTED, Cocaine NONE DETECTED. Positive for amphetamines. Patient advised to stop using due to stroke risk. Hx stroke  Other Active Problems Schizophrenia   Hospital day # 3  Patient seen and examined by NP/APP with MD. MD to update note as needed.   Janine Ores, DNP, FNP-BC [facility] Pager: [phone]  I have personally obtained history,examined this patient, reviewed notes, independently viewed imaging studies, participated in medical decision making and plan of care.ROS completed by me personally and pertinent positives fully documented  I have made any additions or clarifications directly to the above note. Agree with note above.  Patient's condition has declined with refractory respiratory failure requiring vasopressor support as well as difficulty with oxygenation.  Patient chances of surviving this seen quite poor.  Despite multiple attempts to reach family or next of kin: No one been found.  I agree with making the patient DNR.  Discussed with Dr. Silas Flood Dr. Osborne Casco.This patient is critically ill and at significant risk of neurological worsening, death and care requires constant monitoring of vital signs, hemodynamics,respiratory and cardiac monitoring, extensive review of multiple databases, frequent neurological assessment, discussion with family, other specialists and medical decision making of high complexity.I have made any additions or clarifications directly to the above note.This critical care time does not reflect procedure time, or teaching time or supervisory time of PA/NP/Med Resident etc but could involve care discussion time.  I spent 30 minutes of neurocritical care time  in the care of  this patient.      Antony Contras, MD Medical Director [facility] Pager: [phone] 12/18/2021  3:29 PM    To contact Stroke Continuity provider, please refer to [email or web address] After hours, contact General Neurology

## 2021-12-18 NOTE — Progress Notes (Signed)
Nephrology Follow-Up Consult note     Requesting provider: [name] Service requesting consult: PCCM Reason for consult: acute on chronic kidney injury     Assessment/Recommendations: Darryl Parsons is a/an 66 y.o. male with a past medical history hypertension, CKD, cocaine use, methamphetamine use, HLD, and schizoaffective idsorder who present after being found unresponsive in his hotel room admitted to the ICU for bilateral basal ganglia hemorrhages, AKI, and aspiration pneumonia.    Anuric Acute on chronic kidney injury (unchanged): Suspect ATN give hypotension and dehydration. History of CKD baseline Cr around 1.8.  - 698m overnight however with clinical decline overnight  -Continue on CRRT for now -Continue to monitor daily Cr, Dose meds for GFR -Monitor Daily I/Os, Daily weight -Maintain MAP>65 for optimal renal perfusion.  -Avoid nephrotoxic medications including NSAIDs and Vanc/Zosyn combo   Volume Status: Hypervolemic on exam, no pulm edema on repeat CXR, however has been more hypotensive with increasing pressor need, would avoid taking off more fluid for now    Hypertension: Amlodipine and HCTZ as outpatient. Holding in setting of hypotension, Levo transitioned to neo in setting of afib, goal BP <160 for cerebral edema   Sepsis: Left sided opacities with cavitation aspiration vs CAP . Blood cultures with gram positive staph anaerobic bottle only, urine cx with pansenstive ecoli. - Intubated, vent management per PCCM  - Currently on vanc and unasyn  ICH with cerebral edema: Right basal ganglia with midline shift and smaller left basal ganglia hemorrhage, right thalamus, and right temporal lobe. CT head repeated on 2/2 for worsening mental status, stable cerebral edema and ICH.  BP goal <160,  Acute hypoxic respiratory failure: 2/2 pneumonia, now 100% on vent, may be related to vent dyssynchrony and aspiration vs PE in setting of new afib. CXR with new infiltration of  the RLL   Atrial fibrillation: Decompensated overnight, now on amiodarone. May be related to hypoxia and vent dyssynchrony vs PE. Not on anticoagulation due to [place] [facility] per PCCM  Plan to transition patient to DNR given declining clinical status. TOC continue to work on finding sAir traffic controller   JIona Beard MD IMTS, PGY-2 12/18/2021 9:02 AM  ___________________________________________________________  CC: altered mental status  Interval History/Subjective: Tachycardic overnight received adenosine, found to be in afib started on amiodarone, pressors switched to neo. More urine output overnight. This morning remains minimally responsive.  Medications:  Current Facility-Administered Medications  Medication Dose Route Frequency Provider Last Rate Last Admin    prismasol BGK 4/2.5 infusion   CRRT Continuous PReesa Chew MD 300 mL/hr at 12/17/21 1507 New Bag at 12/17/21 1507    prismasol BGK 4/2.5 infusion   CRRT Continuous PReesa Chew MD 300 mL/hr at 12/18/21 0845 New Bag at 12/18/21 0845    stroke: mapping our early stages of recovery book   Does not apply Once CJacky Kindle MD       0.9 %  sodium chloride infusion   Intravenous PRN SAnders Simmonds MD   Paused at 12/18/21 0408   0.9 %  sodium chloride infusion   Intra-arterial PRN HCorey Harold NP       acetaminophen (TYLENOL) tablet 650 mg  650 mg Per Tube Q4H PRN CJacky Kindle MD   650 mg at 12/16/21 1149   Or   acetaminophen (TYLENOL) 160 MG/5ML solution 650 mg  650 mg Per Tube Q4H PRN CJacky Kindle MD   650 mg at 12/17/21 2154   Or   acetaminophen (TYLENOL) suppository  650 mg  650 mg Rectal Q4H PRN Jacky Kindle, MD       amiodarone (NEXTERONE PREMIX) 360-4.14 MG/200ML-% (1.8 mg/mL) IV infusion  60 mg/hr Intravenous Continuous Frederik Pear, MD 33.3 mL/hr at 12/18/21 0725 60 mg/hr at 12/18/21 0725   Followed by   amiodarone (NEXTERONE PREMIX) 360-4.14 MG/200ML-% (1.8 mg/mL) IV infusion  30  mg/hr Intravenous Continuous Ogan, Okoronkwo U, MD       Ampicillin-Sulbactam (UNASYN) 3 g in sodium chloride 0.9 % 100 mL IVPB  3 g Intravenous Q8H Dang, Thuy D, RPH   Stopped at 12/18/21 0710   B-complex with vitamin C tablet 1 tablet  1 tablet Per Tube Daily Agarwala, Einar Grad, MD   1 tablet at 12/17/21 1435   chlorhexidine gluconate (MEDLINE KIT) (PERIDEX) 0.12 % solution 15 mL  15 mL Mouth Rinse BID [name], MD   15 mL at 12/18/21 0846   Chlorhexidine Gluconate Cloth 2 % PADS 6 each  6 each Topical Daily Anders Simmonds, MD   6 each at 12/17/21 2030   collagenase (SANTYL) ointment   Topical Daily Jacky Kindle, MD   Given at 12/17/21 1436   feeding supplement (PROSource TF) liquid 90 mL  90 mL Per Tube QID [name], MD   90 mL at 12/17/21 2154   feeding supplement (VITAL 1.5 CAL) liquid 1,000 mL  1,000 mL Per Tube Continuous [name], MD   Stopped at 12/18/21 0400   fentaNYL (SUBLIMAZE) injection 12.5-50 mcg  12.5-50 mcg Intravenous Q2H PRN Anders Simmonds, MD   50 mcg at 12/17/21 2211   fentaNYL 2544mg in NS 2525m(1017mml) infusion-PREMIX  0-400 mcg/hr Intravenous Continuous SomAnders SimmondsD 5 mL/hr at 12/18/21 0800 50 mcg/hr at 02/81/44/8108563folic acid (FOLVITE) tablet 1 mg  1 mg Per Tube Daily SimJennelle Human NP   1 mg at 12/17/21 0901   heparin injection 1,000-6,000 Units  1,000-6,000 Units CRRT PRN PeeReesa ChewD   2,400 Units at 12/18/21 0420   insulin aspart (novoLOG) injection 0-9 Units  0-9 Units Subcutaneous Q4H Gleason, LauOtilio CarpenA-C   1 Units at 12/18/21 [id number] mouth rinse  15 mL Mouth Rinse 10 times per day AgaKipp BroodD   15 mL at 12/18/21 0640   midazolam (VERSED) 100 mg/100 mL (1 mg/mL) premix infusion  0.5-10 mg/hr Intravenous Continuous Bowser, GraLaurel DimmerP       mupirocin ointment (BACTROBAN) 2 % 1 application  1 application Nasal BID AgaKipp BroodD   1 application at 12/17/61/7855   norepinephrine (LEVOPHED) 16 mg in 250m29mremix infusion  0-40 mcg/min Intravenous Titrated AgarKipp Brood   Stopped at 12/18/21 0528   pantoprazole sodium (PROTONIX) 40 mg/20 mL oral suspension 40 mg  40 mg Per Tube QHS AgarKipp Brood   40 mg at 12/17/21 2154   phenylephrine (NEO-SYNEPHRINE) 20mg6m250mL 33mix infusion  0-400 mcg/min Intravenous Titrated HoffmaCorey Harold2.5 mL/hr at 12/18/21 0800 30 mcg/min at 12/18/21 0800   polyethylene glycol (MIRALAX / GLYCOLAX) packet 17 g  17 g Per Tube Daily SimpsoJennelle Human   17 g at 12/17/21 0901   prismasol BGK 4/2.5 infusion   CRRT Continuous PeepleReesa Chew00 mL/hr at 12/18/21 0540 New Bag at 12/18/21 0540   propofol (DIPRIVAN) 1000 MG/100ML infusion  0-30 mcg/kg/min Intravenous Continuous SommerAnders Simmonds.4 mL/hr at 12/18/21 0822 10 mcg/kg/min at  12/18/21 9357   senna-docusate (Senokot-S) tablet 1 tablet  1 tablet Per Tube BID Jacky Kindle, MD   1 tablet at 12/17/21 2155   sodium chloride 0.9 % primer fluid for CRRT  500 mL CRRT PRN Reesa Chew, MD   4,000 mL at 12/16/21 2110   sodium chloride flush (NS) 0.9 % injection 10-40 mL  10-40 mL Intracatheter Q12H [name], MD   10 mL at 12/17/21 2155   sodium chloride flush (NS) 0.9 % injection 10-40 mL  10-40 mL Intracatheter PRN [name], MD       thiamine tablet 100 mg  100 mg Per Tube Daily Jennelle Human B, NP   100 mg at 12/17/21 0901   vancomycin (VANCOREADY) IVPB 500 mg/100 mL  500 mg Intravenous Q24H [name], MD   Stopped at 12/17/21 2256      Review of Systems: 10 systems reviewed and negative except per interval history/subjective  Physical Exam: Vitals:   12/18/21 0815 12/18/21 0830  BP: (!) 87/74 96/77  Pulse: (!) 117 85  Resp: 14 13  Temp: 98.1 F (36.7 C) 98.1 F (36.7 C)  SpO2: 91% 92%   Total I/O In: 110.7 [I.V.:76.9; IV Piggyback:33.8] Out: 147 [Urine:5; Other:67; Stool:75]  Intake/Output Summary (Last 24 hours) at 12/18/2021 0902 Last data filed at 12/18/2021  0800 Gross per 24 hour  Intake 3021.06 ml  Output 1623 ml  Net 1398.06 ml    Constitutional: intubated, ill appearing, restless with vent ENMT: bruising to left side of face, right gaze deviation, pupils small sluggishly reactive CV: normal rate, non pitting edema of the LE, bilateral UE edema, normal pulses Respiratory: gurgling bronchial sounds, coarse breath sounds anteriorly, intubated on vent Gastrointestinal: soft, no palpable masses or hernias Skin: thigh and abdominal wounds clean and dressed Neuro: sedated, moans to pain, does not follow commands  Test Results I personally reviewed new and old clinical labs and radiology tests Lab Results  Component Value Date   NA 147 (H) 12/18/2021   K 4.5 12/18/2021   CL 112 (H) 12/18/2021   CO2 21 (L) 12/18/2021   BUN 149 (H) 12/18/2021   CREATININE 4.62 (H) 12/18/2021   CALCIUM 7.6 (L) 12/18/2021   ALBUMIN 1.5 (L) 12/18/2021   PHOS 5.0 (H) 12/18/2021

## 2021-12-18 NOTE — Progress Notes (Signed)
°  Echocardiogram 2D Echocardiogram has been performed.  Darryl Parsons 12/18/2021, 3:09 PM

## 2021-12-18 NOTE — Significant Event (Addendum)
Patient with stroke and altered mental status with multisystem organ failure including mental status depression, acute renal failure on CRRT, respiratory failure due to pneumonia worsened by what looks to be recent aspiration event in hospital, septic shock on vasopressors.  He has a grave prognosis with poor chance of neurologic recovery.  With his multisystem failure including hypoxemia and shock, resuscitative efforts are futile.  Discussed with neurology colleague Dr. Leonie Man and nephrology colleague Dr. Joylene Grapes, and we all agree with changing CODE STATUS to DNR given futility of resuscitative efforts.  To date, no kin or decision-maker has been identified.  Ongoing efforts continue to identify surrogate decision maker.  Social work and transition of care assisting.  If decision maker is identified will readdress goals of care with decision maker.

## 2021-12-18 NOTE — Progress Notes (Cosign Needed Addendum)
PCCM Interval Progress Note  66 yo M admitted after found down in hotel, workup revealed bilateral basal ganglia hemorrhage, hypoxic respiratory failure 2/2 CAP with cavitary appearing lesion, AKI with uremia and mild rhabdomyolysis.  Past 24 hours have been complicated by vent dyssynchrony, SVT, unstable Afib RVR, worsening PNA, and shock -- presumed septic  On PCV 100% FiO2 10 PEEP, RR 26  Received PRN NMB today for dyssynchrony, and with improvement in oxygenation he converted from Afib RVR to NSR   Since this time however pt has had Worsening shock, worsening hypoxemia.  ABGs have revealed worsening acidosis & hypercarbia, Most recently 7.167/85/91  In discussion between multiple physicians, given clinical state & lack of NOK contacts -- 2 physcian DNR    Acute respiratory failure with hypoxia and hypercarbia CAP ARDS  Septic shock  ECHO at bedside -- does not look like RV failure (earlier today, concern for possible PE)  P -start continuous NMB  -will change from PCV to PRVC -- FiO2 100% PEEP 10 RR 30 Vt 580 (this increased pt minute ventilation from 13 to 17)  -ABG in 1 hr -adding NE, vaso -- will wean off neo  -CCM MD placing arterial line -- see separate procedure note  -DNR    Additional critical care time [place] MSN, AGACNP-BC [place] for pager  12/18/2021, 2:49 PM

## 2021-12-18 NOTE — Progress Notes (Signed)
Sodium has been trending down. Will resume 3% HTS overnight with goal Sodium of 150-155.  [place] Pager Number [phone]

## 2021-12-18 NOTE — Progress Notes (Signed)
PT Cancellation Note  Patient Details Name: SIRIS HOOS MRN: [id number] DOB: 1955/11/23   Cancelled Treatment:    Reason Eval/Treat Not Completed: Medical issues which prohibited therapy;Fatigue/lethargy limiting ability to participate. Intubated, sedated, on CRRT. Pt remains inappropriate to mobilize at this time.   Zenaida Niece 12/18/2021, 3:04 PM

## 2021-12-19 ENCOUNTER — Inpatient Hospital Stay (HOSPITAL_COMMUNITY): Payer: Medicaid Other

## 2021-12-19 DIAGNOSIS — J9601 Acute respiratory failure with hypoxia: Secondary | ICD-10-CM | POA: Diagnosis not present

## 2021-12-19 DIAGNOSIS — E43 Unspecified severe protein-calorie malnutrition: Secondary | ICD-10-CM

## 2021-12-19 DIAGNOSIS — N179 Acute kidney failure, unspecified: Secondary | ICD-10-CM | POA: Diagnosis not present

## 2021-12-19 DIAGNOSIS — A419 Sepsis, unspecified organism: Principal | ICD-10-CM

## 2021-12-19 DIAGNOSIS — I610 Nontraumatic intracerebral hemorrhage in hemisphere, subcortical: Secondary | ICD-10-CM | POA: Diagnosis not present

## 2021-12-19 DIAGNOSIS — R6521 Severe sepsis with septic shock: Secondary | ICD-10-CM

## 2021-12-19 DIAGNOSIS — N3000 Acute cystitis without hematuria: Secondary | ICD-10-CM

## 2021-12-19 DIAGNOSIS — I616 Nontraumatic intracerebral hemorrhage, multiple localized: Secondary | ICD-10-CM | POA: Diagnosis not present

## 2021-12-19 DIAGNOSIS — Z978 Presence of other specified devices: Secondary | ICD-10-CM | POA: Diagnosis not present

## 2021-12-19 LAB — RENAL FUNCTION PANEL
Albumin: <1.5 g/dL — ABNORMAL LOW (ref 3.5–5.0)
Albumin: <1.5 g/dL — ABNORMAL LOW (ref 3.5–5.0)
Albumin: 1.5 g/dL — ABNORMAL LOW (ref 3.5–5.0)
Anion gap: 14 (ref 5–15)
Anion gap: 15 (ref 5–15)
Anion gap: 18 — ABNORMAL HIGH (ref 5–15)
BUN: 122 mg/dL — ABNORMAL HIGH (ref 8–23)
BUN: 116 mg/dL — ABNORMAL HIGH (ref 8–23)
BUN: 96 mg/dL — ABNORMAL HIGH (ref 8–23)
CO2: 22 mmol/L (ref 22–32)
CO2: 21 mmol/L — ABNORMAL LOW (ref 22–32)
CO2: 14 mmol/L — ABNORMAL LOW (ref 22–32)
Calcium: 7.4 mg/dL — ABNORMAL LOW (ref 8.9–10.3)
Calcium: 7.4 mg/dL — ABNORMAL LOW (ref 8.9–10.3)
Calcium: 7.8 mg/dL — ABNORMAL LOW (ref 8.9–10.3)
Chloride: 109 mmol/L (ref 98–111)
Chloride: 108 mmol/L (ref 98–111)
Chloride: 113 mmol/L — ABNORMAL HIGH (ref 98–111)
Creatinine, Ser: 3.68 mg/dL — ABNORMAL HIGH (ref 0.61–1.24)
Creatinine, Ser: 3.6 mg/dL — ABNORMAL HIGH (ref 0.61–1.24)
Creatinine, Ser: 3.67 mg/dL — ABNORMAL HIGH (ref 0.61–1.24)
GFR, Estimated: 18 mL/min — ABNORMAL LOW (ref >60)
GFR, Estimated: 18 mL/min — ABNORMAL LOW (ref >60)
GFR, Estimated: 18 mL/min — ABNORMAL LOW (ref >60)
Glucose, Bld: 163 mg/dL — ABNORMAL HIGH (ref 70–99)
Glucose, Bld: 171 mg/dL — ABNORMAL HIGH (ref 70–99)
Glucose, Bld: 86 mg/dL (ref 70–99)
Phosphorus: 6.4 mg/dL — ABNORMAL HIGH (ref 2.5–4.6)
Phosphorus: 6.7 mg/dL — ABNORMAL HIGH (ref 2.5–4.6)
Phosphorus: 11.1 mg/dL — ABNORMAL HIGH (ref 2.5–4.6)
Potassium: 5.7 mmol/L — ABNORMAL HIGH (ref 3.5–5.1)
Potassium: 5.7 mmol/L — ABNORMAL HIGH (ref 3.5–5.1)
Potassium: 6.6 mmol/L (ref 3.5–5.1)
Sodium: 145 mmol/L (ref 135–145)
Sodium: 144 mmol/L (ref 135–145)
Sodium: 145 mmol/L (ref 135–145)

## 2021-12-19 LAB — POCT I-STAT 7, (LYTES, BLD GAS, ICA,H+H)
Acid-base deficit: 2 mmol/L (ref 0.0–2.0)
Bicarbonate: 25.1 mmol/L (ref 20.0–28.0)
Calcium, Ion: 1.05 mmol/L — ABNORMAL LOW (ref 1.15–1.40)
HCT: 32 % — ABNORMAL LOW (ref 39.0–52.0)
Hemoglobin: 10.9 g/dL — ABNORMAL LOW (ref 13.0–17.0)
O2 Saturation: 99 %
Patient temperature: 97.8
Potassium: 5.6 mmol/L — ABNORMAL HIGH (ref 3.5–5.1)
Sodium: 145 mmol/L (ref 135–145)
TCO2: 27 mmol/L (ref 22–32)
pCO2 arterial: 48.9 mmHg — ABNORMAL HIGH (ref 32.0–48.0)
pH, Arterial: 7.316 — ABNORMAL LOW (ref 7.350–7.450)
pO2, Arterial: 150 mmHg — ABNORMAL HIGH (ref 83.0–108.0)

## 2021-12-19 LAB — BASIC METABOLIC PANEL
Anion gap: 15 (ref 5–15)
BUN: 101 mg/dL — ABNORMAL HIGH (ref 8–23)
CO2: 18 mmol/L — ABNORMAL LOW (ref 22–32)
Calcium: 7.5 mg/dL — ABNORMAL LOW (ref 8.9–10.3)
Chloride: 114 mmol/L — ABNORMAL HIGH (ref 98–111)
Creatinine, Ser: 3.55 mg/dL — ABNORMAL HIGH (ref 0.61–1.24)
GFR, Estimated: 18 mL/min — ABNORMAL LOW (ref >60)
Glucose, Bld: 138 mg/dL — ABNORMAL HIGH (ref 70–99)
Potassium: 6.2 mmol/L — ABNORMAL HIGH (ref 3.5–5.1)
Sodium: 147 mmol/L — ABNORMAL HIGH (ref 135–145)

## 2021-12-19 LAB — CBC
HCT: 30.2 % — ABNORMAL LOW (ref 39.0–52.0)
Hemoglobin: 9.2 g/dL — ABNORMAL LOW (ref 13.0–17.0)
MCH: 26.5 pg (ref 26.0–34.0)
MCHC: 30.5 g/dL (ref 30.0–36.0)
MCV: 87 fL (ref 80.0–100.0)
Platelets: 147 10*3/uL — ABNORMAL LOW (ref 150–400)
RBC: 3.47 MIL/uL — ABNORMAL LOW (ref 4.22–5.81)
RDW: 16.9 % — ABNORMAL HIGH (ref 11.5–15.5)
WBC: 55 10*3/uL (ref 4.0–10.5)
nRBC: 1.3 % — ABNORMAL HIGH (ref 0.0–0.2)

## 2021-12-19 LAB — SODIUM
Sodium: 145 mmol/L (ref 135–145)
Sodium: 149 mmol/L — ABNORMAL HIGH (ref 135–145)

## 2021-12-19 LAB — CULTURE, BLOOD (ROUTINE X 2): Special Requests: ADEQUATE

## 2021-12-19 LAB — HEPATIC FUNCTION PANEL
ALT: 957 U/L — ABNORMAL HIGH (ref 0–44)
AST: 1662 U/L — ABNORMAL HIGH (ref 15–41)
Albumin: <1.5 g/dL — ABNORMAL LOW (ref 3.5–5.0)
Alkaline Phosphatase: 83 U/L (ref 38–126)
Bilirubin, Direct: 1.5 mg/dL — ABNORMAL HIGH (ref 0.0–0.2)
Indirect Bilirubin: 1.4 mg/dL — ABNORMAL HIGH (ref 0.3–0.9)
Total Bilirubin: 2.9 mg/dL — ABNORMAL HIGH (ref 0.3–1.2)
Total Protein: 5.6 g/dL — ABNORMAL LOW (ref 6.5–8.1)

## 2021-12-19 LAB — GLUCOSE, CAPILLARY
Glucose-Capillary: 119 mg/dL — ABNORMAL HIGH (ref 70–99)
Glucose-Capillary: 120 mg/dL — ABNORMAL HIGH (ref 70–99)
Glucose-Capillary: 137 mg/dL — ABNORMAL HIGH (ref 70–99)
Glucose-Capillary: 142 mg/dL — ABNORMAL HIGH (ref 70–99)
Glucose-Capillary: 155 mg/dL — ABNORMAL HIGH (ref 70–99)
Glucose-Capillary: 26 mg/dL — CL (ref 70–99)
Glucose-Capillary: 74 mg/dL (ref 70–99)

## 2021-12-19 LAB — MAGNESIUM: Magnesium: 2.7 mg/dL — ABNORMAL HIGH (ref 1.7–2.4)

## 2021-12-19 MED ORDER — DEXTROSE 50 % IV SOLN
INTRAVENOUS | Status: AC
  Filled 2021-12-19: qty 50

## 2021-12-19 MED ORDER — SODIUM CHLORIDE 23.4 % INJECTION (4 MEQ/ML) FOR IV ADMINISTRATION
120.0000 meq | Freq: Once | INTRAVENOUS | Status: AC
  Administered 2021-12-19: 120 meq via INTRAVENOUS
  Filled 2021-12-19: qty 30

## 2021-12-19 MED ORDER — FLUCONAZOLE IN SODIUM CHLORIDE 400-0.9 MG/200ML-% IV SOLN: 400.0000 mg | INTRAVENOUS | Status: DC

## 2021-12-19 MED ORDER — SODIUM CHLORIDE 23.4 % INJECTION (4 MEQ/ML) FOR IV ADMINISTRATION
120.0000 meq | Freq: Four times a day (QID) | INTRAVENOUS | Status: DC | PRN
  Filled 2021-12-19: qty 30

## 2021-12-19 MED ORDER — PRISMASOL BGK 0/2.5 32-2.5 MEQ/L EC SOLN
Status: DC
  Filled 2021-12-19 (×10): qty 5000

## 2021-12-19 MED ORDER — FLUCONAZOLE IN SODIUM CHLORIDE 400-0.9 MG/200ML-% IV SOLN
400.0000 mg | INTRAVENOUS | Status: DC
  Administered 2021-12-19: 400 mg via INTRAVENOUS
  Filled 2021-12-19 (×2): qty 200

## 2021-12-19 MED ORDER — DEXTROSE 50 % IV SOLN
1.0000 | Freq: Once | INTRAVENOUS | Status: AC
  Administered 2021-12-19: 50 mL via INTRAVENOUS

## 2021-12-19 MED ORDER — SODIUM CHLORIDE 23.4 % INJECTION (4 MEQ/ML) FOR IV ADMINISTRATION
120.0000 meq | Freq: Once | INTRAVENOUS | Status: AC
Start: 2021-12-19 — End: 2021-12-19
  Administered 2021-12-19: 120 meq via INTRAVENOUS
  Filled 2021-12-19: qty 30

## 2021-12-19 MED ORDER — METRONIDAZOLE 500 MG/100ML IV SOLN
500.0000 mg | Freq: Two times a day (BID) | INTRAVENOUS | Status: DC
  Administered 2021-12-19 (×2): 500 mg via INTRAVENOUS
  Filled 2021-12-19 (×2): qty 100

## 2021-12-19 NOTE — Progress Notes (Signed)
Brief nephrology update note  On 0K for hyperkalemia. Worsening hyperkalemia today so increased DFR to 1300; hesitant to increase further given this could cause rapid shifting which can be deleterious to the brain and heart.  Patient now with worsening hypotension with MAP <60. Unable to tolerate CRRT at this point so would stop. If his MAP improves we could consider CRRT again at that time but given his overall poor traj ectory further renal replacement therapy may be futile.

## 2021-12-19 NOTE — Progress Notes (Signed)
Nephrology Follow-Up Consult note     Requesting provider: [name] Service requesting consult: PCCM Reason for consult: acute on chronic kidney injury     Assessment/Recommendations: Darryl Parsons is a/an 66 y.o. male with a past medical history hypertension, CKD, cocaine use, methamphetamine use, HLD, and schizoaffective idsorder who present after being found unresponsive in his hotel room admitted to the ICU for bilateral basal ganglia hemorrhages, AKI, and aspiration pneumonia.    Anuric Acute on chronic kidney injury (unchanged): Suspect ATN give hypotension and dehydration. History of CKD baseline Cr around 1.8.  -Urine output remains fairly low -Continue on CRRT for now; consideration of futility of care based on progress today -Continue to monitor daily Cr, Dose meds for GFR -Monitor Daily I/Os, Daily weight -Maintain MAP>65 for optimal renal perfusion.  -Avoid nephrotoxic medications including NSAIDs and Vanc/Zosyn combo   Shock: Likely multifactorial.  Escalating pressors.  Management per primary team   Sepsis: Worsening leukocytosis - Intubated, vent management per PCCM  -Antibiotics per primary team  Acute hypoxic respiratory failure: Difficult vent management.  Per CCM  ICH with cerebral edema: Right basal ganglia with midline shift and smaller left basal ganglia hemorrhage, right thalamus, and right temporal lobe. CT head repeated on 2/2 for worsening mental status, stable cerebral edema and ICH.  BP goal <160,  Atrial fibrillation: Has received amiodarone.  Management per primary  Patient continues to worsen clinically.  No points of contact.  I would agree with no escalation of care.  We will continue to reevaluate for futility of care  Reesa Chew, MD  12/19/2021 10:03 AM  ___________________________________________________________  CC: altered mental status  Interval History/Subjective: Persistently worsening over the past 24 hours with  escalating needs for pressors.  Had to switch to 0K bath because of hyperkalemia.  Medications:  Current Facility-Administered Medications  Medication Dose Route Frequency Provider Last Rate Last Admin    stroke: mapping our early stages of recovery book   Does not apply Once Jacky Kindle, MD       0.9 %  sodium chloride infusion   Intravenous PRN Anders Simmonds, MD 5 mL/hr at 12/19/21 0606 New Bag at 12/19/21 0606   0.9 %  sodium chloride infusion   Intra-arterial PRN Corey Harold, NP       acetaminophen (TYLENOL) tablet 650 mg  650 mg Per Tube Q4H PRN Jacky Kindle, MD   650 mg at 12/16/21 1149   Or   acetaminophen (TYLENOL) 160 MG/5ML solution 650 mg  650 mg Per Tube Q4H PRN Jacky Kindle, MD   650 mg at 12/17/21 2154   Or   acetaminophen (TYLENOL) suppository 650 mg  650 mg Rectal Q4H PRN Jacky Kindle, MD       amiodarone (NEXTERONE PREMIX) 360-4.14 MG/200ML-% (1.8 mg/mL) IV infusion  30 mg/hr Intravenous Continuous Ogan, [place]oron[place]wo U, MD 16.67 mL/hr at 12/19/21 0700 30 mg/hr at 12/19/21 0700   artificial tears (LACRILUBE) ophthalmic ointment 1 application  1 application Both Eyes X3G Bowser, Laurel Dimmer, NP   1 application at 18/29/93 [id number]   B-complex with vitamin C tablet 1 tablet  1 tablet Per Tube Daily [name], MD   1 tablet at 12/17/21 1435   ceFEPIme (MAXIPIME) 2 g in sodium chloride 0.9 % 100 mL IVPB  2 g Intravenous Q12H Dang, Thuy D, RPH   Stopped at 12/18/21 2226   chlorhexidine gluconate (MEDLINE KIT) (PERIDEX) 0.12 % solution 15 mL  15 mL Mouth Rinse BID  [name], MD   15 mL at 12/19/21 0803   Chlorhexidine Gluconate Cloth 2 % PADS 6 each  6 each Topical Daily Anders Simmonds, MD   6 each at 12/19/21 0500   cisatracurium (NIMBEX) 200 mg in sodium chloride 0.9 % 100 mL (2 mg/mL) infusion  0-10 mcg/kg/min Intravenous Titrated Cristal Generous, NP 6.62 mL/hr at 12/19/21 0700 3 mcg/kg/min at 12/19/21 0700   collagenase (SANTYL) ointment   Topical Daily Jacky Kindle,  MD   1 application at 70/62/37 1423   feeding supplement (PROSource TF) liquid 90 mL  90 mL Per Tube QID [name], MD   90 mL at 12/17/21 2154   feeding supplement (VITAL 1.5 CAL) liquid 1,000 mL  1,000 mL Per Tube Continuous [name], MD   Stopped at 12/18/21 0400   fentaNYL (SUBLIMAZE) injection 12.5-50 mcg  12.5-50 mcg Intravenous Q2H PRN Anders Simmonds, MD   50 mcg at 12/17/21 2211   fentaNYL 2511mcg in NS 271mL (71mcg/ml) infusion-PREMIX  0-400 mcg/hr Intravenous Continuous Anders Simmonds, MD 15 mL/hr at 12/19/21 0700 150 mcg/hr at 12/19/21 0700   fluconazole (DIFLUCAN) IVPB 400 mg  400 mg Intravenous Q24H Laren Everts, RPH 100 mL/hr at 12/19/21 0802 400 mg at 62/83/15 1761   folic acid (FOLVITE) tablet 1 mg  1 mg Per Tube Daily Jennelle Human B, NP   1 mg at 12/17/21 0901   heparin injection 1,000-6,000 Units  1,000-6,000 Units CRRT PRN Reesa Chew, MD   2,400 Units at 12/18/21 1834   insulin aspart (novoLOG) injection 0-9 Units  0-9 Units Subcutaneous Q4H Gleason, Otilio Carpen, PA-C   1 Units at 12/19/21 6073   MEDLINE mouth rinse  15 mL Mouth Rinse 10 times per day [name], MD   15 mL at 12/19/21 0611   metroNIDAZOLE (FLAGYL) IVPB 500 mg  500 mg Intravenous Q12H Trevor Mace U, MD 100 mL/hr at 12/19/21 0700 Infusion Verify at 12/19/21 0700   midazolam (VERSED) 100 mg/100 mL (1 mg/mL) premix infusion  0.5-10 mg/hr Intravenous Continuous Bowser, Laurel Dimmer, NP 4 mL/hr at 12/19/21 0700 4 mg/hr at 12/19/21 0700   mupirocin ointment (BACTROBAN) 2 % 1 application  1 application Nasal BID [name], MD   1 application at 71/06/26 2156   norepinephrine (LEVOPHED) 16 mg in 291mL premix infusion  0-40 mcg/min Intravenous Titrated Agarwala, Einar Grad, MD 37.5 mL/hr at 12/19/21 0700 40 mcg/min at 12/19/21 0700   pantoprazole (PROTONIX) injection 40 mg  40 mg Intravenous QHS Hunsucker, Bonna Gains, MD   40 mg at 12/18/21 2219   polyethylene glycol (MIRALAX / GLYCOLAX) packet 17 g   17 g Per Tube Daily Jennelle Human B, NP   17 g at 12/17/21 0901   prismasol BGK 0/2.5 infusion   CRRT Continuous Elmarie Shiley, MD 800 mL/hr at 12/19/21 0538 New Bag at 12/19/21 0538   prismasol BGK 2/2.5 replacement solution   CRRT Continuous Elmarie Shiley, MD 300 mL/hr at 12/18/21 2026 New Bag at 12/18/21 2026   prismasol BGK 2/2.5 replacement solution   CRRT Continuous Elmarie Shiley, MD 300 mL/hr at 12/18/21 2027 New Bag at 12/18/21 2027   propofol (DIPRIVAN) 1000 MG/100ML infusion  0-30 mcg/kg/min Intravenous Continuous Anders Simmonds, MD 5.1 mL/hr at 12/19/21 0700 15 mcg/kg/min at 12/19/21 0700   senna-docusate (Senokot-S) tablet 1 tablet  1 tablet Per Tube BID Jacky Kindle, MD   1 tablet at 12/17/21 2155   sodium chloride (hypertonic) 3 %  solution   Intravenous Continuous Donnetta Simpers, MD 75 mL/hr at 12/19/21 0700 Infusion Verify at 12/19/21 0700   sodium chloride 0.9 % primer fluid for CRRT  500 mL CRRT PRN Reesa Chew, MD   4,000 mL at 12/16/21 2110   sodium chloride flush (NS) 0.9 % injection 10-40 mL  10-40 mL Intracatheter Q12H Agarwala, Einar Grad, MD   10 mL at 12/18/21 1000   sodium chloride flush (NS) 0.9 % injection 10-40 mL  10-40 mL Intracatheter PRN [name], MD       thiamine tablet 100 mg  100 mg Per Tube Daily Jennelle Human B, NP   100 mg at 12/17/21 0901   vancomycin (VANCOREADY) IVPB 500 mg/100 mL  500 mg Intravenous Q24H Dang, Thuy D, [facility]   Stopped at 12/18/21 1907   vasopressin (PITRESSIN) 20 Units in sodium chloride 0.9 % 100 mL infusion-*FOR SHOCK*  0-0.03 Units/min Intravenous Continuous Bowser, Laurel Dimmer, NP 9 mL/hr at 12/19/21 0700 0.03 Units/min at 12/19/21 0700      Review of Systems: 10 systems reviewed and negative except per interval history/subjective  Physical Exam: Vitals:   12/19/21 0830 12/19/21 0922  BP: 114/79   Pulse:    Resp: (!) 28   Temp: (!) 97.5 F (36.4 C)   SpO2:  100%   Total I/O In: 451.2 [I.V.:342.2; IV Piggyback:109] Out: 499  [Other:499]  Intake/Output Summary (Last 24 hours) at 12/19/2021 1003 Last data filed at 12/19/2021 0900 Gross per 24 hour  Intake 3633.84 ml  Output 3999 ml  Net -365.16 ml   Constitutional: intubated, ill appearing, lying in bed ENMT: ET tube in place, moist mucous membranes CV: normal rate, trace pitting edema in the bilateral lower extremities Respiratory: coarse bilateral breath sounds, ventilated Gastrointestinal: soft, no audible bowel sounds Skin: thigh and abdominal wounds clean and dressed Neuro: sedated, no interaction  Test Results I personally reviewed new and old clinical labs and radiology tests Lab Results  Component Value Date   NA 145 12/19/2021   NA 144 12/19/2021   K 5.7 (H) 12/19/2021   CL 108 12/19/2021   CO2 21 (L) 12/19/2021   BUN 116 (H) 12/19/2021   CREATININE 3.60 (H) 12/19/2021   CALCIUM 7.4 (L) 12/19/2021   ALBUMIN <1.5 (L) 12/19/2021   PHOS 6.7 (H) 12/19/2021

## 2021-12-19 NOTE — Progress Notes (Addendum)
1650-K 6.6. Dr. Tamala Julian notified. No new orders given

## 2021-12-19 NOTE — Progress Notes (Signed)
Physical Therapy Wound Treatment Patient Details  Name: Darryl Parsons MRN: [id number] Date of Birth: 06/19/1956  Today's Date: 12/19/2021 Time: 0825-0857 Time Calculation (min): 32 min  Subjective  Subjective Assessment Subjective: Pt lethargic on vent - eyes closed throughout session. Patient and Family Stated Goals: None stated Date of Onset:  (Unknown) Prior Treatments: Dressing changes  Pain Score:    Wound Assessment  Pressure Injury 11/17/2021 Hip Left Unstageable - Full thickness tissue loss in which the base of the injury is covered by slough (yellow, tan, gray, green or brown) and/or eschar (tan, brown or black) in the wound bed. (Active)  Wound Image  12/16/21 1440  Dressing Type Foam - Lift dressing to assess site every shift;Barrier Film (skin prep);Moist to moist;Gauze (Comment);Santyl 12/19/21 0908  Dressing Changed 12/19/21 0908  Dressing Change Frequency Daily 12/19/21 0908  State of Healing Early/partial granulation 12/19/21 0908  Site / Wound Assessment Brown;Red;Yellow 12/19/21 0908  % Wound base Red or Granulating 25% 12/19/21 0908  % Wound base Yellow/Fibrinous Exudate 60% 12/19/21 0908  % Wound base Black/Eschar 15% 12/19/21 0908  % Wound base Other/Granulation Tissue (Comment) 0% 12/19/21 0908  Peri-wound Assessment Maceration;Excoriated 12/18/21 1528  Wound Length (cm) 10.5 cm 12/16/21 1440  Wound Width (cm) 8 cm 12/16/21 1440  Wound Depth (cm) 0 cm 12/16/21 1440  Wound Surface Area (cm^2) 84 cm^2 12/16/21 1440  Wound Volume (cm^3) 0 cm^3 12/16/21 1440  Tunneling (cm) 0 12/16/21 1440  Undermining (cm) 0 12/16/21 1440  Margins Unattached edges (unapproximated) 12/19/21 0908  Drainage Amount Scant 12/19/21 0908  Drainage Description Serosanguineous 12/19/21 0908  Treatment Debridement (Selective);Hydrotherapy (Pulse lavage);Off loading 12/19/21 0908   Hydrotherapy Pulsed lavage therapy - wound location: L hip Pulsed Lavage with Suction (psi): 8  psi Pulsed Lavage with Suction - Normal Saline Used: 1000 mL Pulsed Lavage Tip: Tip with splash shield Selective Debridement Selective Debridement - Location: L hip Selective Debridement - Tools Used: Forceps, Scalpel Selective Debridement - Tissue Removed: Eschar    Wound Assessment and Plan  Wound Therapy - Assess/Plan/Recommendations Wound Therapy - Clinical Statement: Patient remains very ill with nursing questioning if pt would tolerate being minimally turned to access left hip wound for treatment. Able to use bed's turn assist function and access wound. Patient with one episode of BP dropping into 70s/40s and relatively quickly returned to 80s/60s. Wound continues to make slow progress with more black/brown tissue removed from perimeter of wound bed. Discussed with RN if pt shows further decline to discuss with MD if hydrotherapy remains appropriate. Wound Therapy - Functional Problem List: Global weakness s/p unknown time down and ICU admission Factors Delaying/Impairing Wound Healing: Immobility Hydrotherapy Plan: Debridement, Dressing change, Patient/family education, Pulsatile lavage with suction Wound Therapy - Frequency: 6X / week Wound Therapy - Follow Up Recommendations: dressing changes by RN  Wound Therapy Goals- Improve the function of patient's integumentary system by progressing the wound(s) through the phases of wound healing (inflammation - proliferation - remodeling) by: Wound Therapy Goals - Improve the function of patient's integumentary system by progressing the wound(s) through the phases of wound healing by: Decrease Necrotic Tissue to: 20% Decrease Necrotic Tissue - Progress: Progressing toward goal Increase Granulation Tissue to: 80% Increase Granulation Tissue - Progress: Progressing toward goal Time For Goal Achievement: 7 days Wound Therapy - Potential for Goals: Good  Goals will be updated until maximal potential achieved or discharge criteria met.   Discharge criteria: when goals achieved, discharge from hospital, MD decision/surgical intervention, no  progress towards goals, refusal/missing three consecutive treatments without notification or medical reason.  GP     Charges PT Wound Care Charges $Wound Debridement up to 20 cm: < or equal to 20 cm $ Wound Debridement each add'l 20 sqcm: 3 $PT PLS Gun and Tip: 1 Supply $PT Hydrotherapy Visit: 1 Visit      Arby Barrette, PT Acute Rehabilitation Services  Pager [phone] Office [phone]   Rexanne Mano 12/19/2021, 9:14 AM

## 2021-12-19 NOTE — Progress Notes (Addendum)
STROKE TEAM PROGRESS NOTE   INTERVAL HISTORY Patient is seen in his room with no family at the bedside. Pupils reactive, unequal- unable to get CT as patient is not stable enough for travel. Patient is paralyzed on nimbex, resulting in a limited neuro exam. Currently on vasopressin, norepinephrine for BP support, sedated with versed, fentanyl, propofol. Use 23.4% sodium chloride bolus for fluid and sodium management. Amiodarone infusing as well.  Vitals:   12/19/21 0800 12/19/21 0815 12/19/21 0830 12/19/21 0922  BP: 112/67 (!) 106/31 114/79   Pulse: 66     Resp: (!) 28 (!) 28 (!) 28   Temp: 97.7 F (36.5 C) 97.7 F (36.5 C) (!) 97.5 F (36.4 C)   TempSrc:      SpO2: 100%   100%  Weight:      Height:       CBC:  Recent Labs  Lab 12/18/21 0422 12/18/21 0434 12/19/21 0413 12/19/21 0416  WBC 39.5*  --   --  55.0*  HGB 10.8*   < > 10.9* 9.2*  HCT 33.6*   < > 32.0* 30.2*  MCV 84.4  --   --  87.0  PLT 144*  --   --  147*   < > = values in this interval not displayed.    Basic Metabolic Panel:  Recent Labs  Lab 12/18/21 0422 12/18/21 0434 12/19/21 0000 12/19/21 0413 12/19/21 0416  NA 147*   < > 145 145 145   144  K 4.7   < > 5.7* 5.6* 5.7*  CL 112*   < > 109  --  108  CO2 21*   < > 22  --  21*  GLUCOSE 177*   < > 163*  --  171*  BUN 149*   < > 122*  --  116*  CREATININE 4.62*   < > 3.68*  --  3.60*  CALCIUM 7.6*   < > 7.4*  --  7.4*  MG 2.9*  --   --   --  2.7*  PHOS 5.0*   < > 6.4*  --  6.7*   < > = values in this interval not displayed.    Lipid Panel:  Recent Labs  Lab 12/16/21 0346 12/17/21 0358  CHOL 110  --   TRIG 118   122 227*  HDL 31*  --   CHOLHDL 3.5  --   VLDL 24  --   LDLCALC 55  --     HgbA1c:  Recent Labs  Lab 12/05/2021 1926  HGBA1C 4.2*    Urine Drug Screen:  Recent Labs  Lab 12/16/21 0026  LABOPIA NONE DETECTED  COCAINSCRNUR NONE DETECTED  LABBENZ NONE DETECTED  AMPHETMU POSITIVE*  THCU NONE DETECTED  LABBARB NONE DETECTED      Alcohol Level  Recent Labs  Lab 11/16/2021 1616  ETH <10     IMAGING past 24 hours DG CHEST PORT 1 VIEW  Result Date: 12/19/2021 CLINICAL DATA:  Pneumonia. EXAM: PORTABLE CHEST 1 VIEW COMPARISON:  12/18/2021 FINDINGS: 0542 hours. Endotracheal tube tip is 4.9 cm above the base of the carina. NG tube is looped in the stomach with the tip in the fundal region. Right IJ central line tip overlies the region of the innominate vein confluence. Lungs appear hyperexpanded. Right lung clear. Patchy airspace disease left mid and lower lung is similar to prior although aeration appears improved at the left base. Telemetry leads overlie the chest. IMPRESSION: 1. Interval improvement in aeration at  the left base. Otherwise no change in exam. 2. Support apparatus as described. Electronically Signed   By: Misty Stanley M.D.   On: 12/19/2021 08:08   DG CHEST PORT 1 VIEW  Result Date: 12/18/2021 CLINICAL DATA:  ETT placement EXAM: PORTABLE CHEST 1 VIEW COMPARISON:  None. FINDINGS: Endotracheal tube tip overlies the midthoracic trachea. Nasogastric tube passed below the diaphragm, tip and side port overlying the stomach. Right neck central venous catheter tip overlies the mid superior vena cava. Unchanged cardiomediastinal silhouette. There is multifocal airspace disease bilaterally most prominent in the left mid to lower lung. Probable small left pleural effusion. No visible pneumothorax. IMPRESSION: Endotracheal tube tip overlies the midthoracic trachea. Unchanged bilateral mid to lower lung airspace disease, more confluent on the left. Electronically Signed   By: Maurine Simmering M.D.   On: 12/18/2021 16:25   ECHOCARDIOGRAM LIMITED  Result Date: 12/18/2021    ECHOCARDIOGRAM LIMITED REPORT   Patient Name:   Darryl Parsons Date of Exam: 12/18/2021 Medical Rec #:  [id number]        Height: Accession #:    [id number]       Weight: Date of Birth:  1956/06/10        BSA: Patient Age:    78 years         BP:           97/56  mmHg Patient Gender: M                HR:           75 bpm. Exam Location:  Inpatient Procedure: Limited Echo, Cardiac Doppler and Color Doppler STAT ECHO Indications:    Pulmonary embolus  History:        Patient has prior history of Echocardiogram examinations, most                 recent 12/16/2021. Arrythmias:Atrial Fibrillation. Stroke.                 Polysubstance abuse. Acute renal failure.  Sonographer:    Clayton Lefort RDCS (AE) Referring Phys: [name] NP  Sonographer Comments: Echo performed with patient supine and on artificial respirator. Dr. Erskine Emery with PCCM at bedside. IMPRESSIONS  1. Possible mid cavitary gradient due to LVH and hyperdynamic function doppler not well delineated as patient on ventilator . Left ventricular ejection fraction, by estimation, is 60 to 65%. The left ventricle has normal function. The left ventricle has  no regional wall motion abnormalities. There is severe left ventricular hypertrophy.  2. Right ventricular systolic function is normal. The right ventricular size is normal.  3. The mitral valve is normal in structure. No evidence of mitral valve regurgitation. No evidence of mitral stenosis.  4. The aortic valve is tricuspid. There is mild calcification of the aortic valve. Aortic valve regurgitation is not visualized. Aortic valve sclerosis is present, with no evidence of aortic valve stenosis.  5. The inferior vena cava is normal in size with greater than 50% respiratory variability, suggesting right atrial pressure of 3 mmHg. FINDINGS  Left Ventricle: Possible mid cavitary gradient due to LVH and hyperdynamic function doppler not well delineated as patient on ventilator. Left ventricular ejection fraction, by estimation, is 60 to 65%. The left ventricle has normal function. The left ventricle has no regional wall motion abnormalities. The left ventricular internal cavity size was normal in size. There is severe left ventricular hypertrophy. Right Ventricle: The right  ventricular size is normal. No  increase in right ventricular wall thickness. Right ventricular systolic function is normal. Left Atrium: Left atrial size was normal in size. Right Atrium: Right atrial size was normal in size. Pericardium: There is no evidence of pericardial effusion. Mitral Valve: The mitral valve is normal in structure. No evidence of mitral valve stenosis. Tricuspid Valve: The tricuspid valve is normal in structure. Tricuspid valve regurgitation is not demonstrated. No evidence of tricuspid stenosis. Aortic Valve: The aortic valve is tricuspid. There is mild calcification of the aortic valve. Aortic valve regurgitation is not visualized. Aortic valve sclerosis is present, with no evidence of aortic valve stenosis. Aortic valve peak gradient measures 10.8 mmHg. Aortic valve area, by VTI measures 3.53 cm. Pulmonic Valve: The pulmonic valve was normal in structure. Pulmonic valve regurgitation is not visualized. No evidence of pulmonic stenosis. Aorta: The aortic root is normal in size and structure. Venous: The inferior vena cava is normal in size with greater than 50% respiratory variability, suggesting right atrial pressure of 3 mmHg. IAS/Shunts: No atrial level shunt detected by color flow Doppler. LEFT VENTRICLE PLAX 2D LVIDd:         3.40 cm LVIDs:         2.30 cm LV PW:         1.50 cm LV IVS:        1.80 cm LVOT diam:     2.10 cm LV SV:         76 LVOT Area:     3.46 cm  AORTIC VALVE AV Area (Vmax):    3.02 cm AV Area (Vmean):   2.83 cm AV Area (VTI):     3.53 cm AV Vmax:           164.00 cm/s AV Vmean:          108.000 cm/s AV VTI:            0.215 m AV Peak Grad:      10.8 mmHg LVOT Vmax:         143.00 cm/s LVOT Vmean:        88.200 cm/s LVOT VTI:          0.219 m LVOT/AV VTI ratio: 1.02 MV E velocity: 67.60 cm/s MV A velocity: 83.30 cm/s  SHUNTS MV E/A ratio:  0.81        Systemic VTI:  0.22 m                            Systemic Diam: 2.10 cm Jenkins Rouge MD Electronically signed by  Jenkins Rouge MD Signature Date/Time: 12/18/2021/3:32:48 PM    Final     PHYSICAL EXAM General: Intubated, sedated well-developed, well-nourished male with wounds on left side of face in no acute distress  Respiratory:  Respirations synchronous with ventilator  Neurological paralyzed and sedated: Intubated.  Eyes are closed.  Pupil Rt 73mm, Lt 66mm, round and reactive. Corneal reflex absent, oculocephalic reflex absent. Cough reflex suppressed by paralytic  Does not follow commands and no response to noxious stimuli  ASSESSMENT/PLAN Mr. JAHQUEZ STEFFLER is a 66 y.o. male with history of schizophrenia, stroke, cocaine use, CKD, HTN and smoking presenting after being found down in a hotel room between the bed and the wall.  He was intubated on arrival to the ED and found to have a pressure injury to the left hip.  On CT, he was found to have a 4cm acute right basal ganglia hemorrhage with  edema and 33mm midline shift, along with smaller hemorrhages in the left basal ganglia, right thalamus and right temporal lobe.  He was also found to have an AKI with Cr of 7.04 and CK of 1079. Made DNR on 2/3 given declining status. Remains on CRRT at this time.   ICH:  right BG ICH with smaller ICH in left BG, thalamus and temporal lobe etiology likely hypertensive in the setting of amphetamine abuse vs. ? Endocarditis with bacteremia/spesis CT head 4cm acute right BG ICH with edema and 46mm midline shift, smaller hemorrhages in the left BG, thalamus and temporal lobe, suspected acute/subacute left thalamic infarct CT follow up 1/31 stable right BG ICH CT follow up 2/2 unchanged right BG IPH and unchanged left temporal pole IPH. CT repeat pending Carotid Doppler left ICA unremarkable 2D Echo EF 60-65%, severe LVH LDL 55 HgbA1c 4.2 UDS positive for amphetamine VTE prophylaxis - SCDs No antithrombotic prior to admission, now on No antithrombotic secondary to IPH Therapy recommendations:  pending Disposition:   pending  Cerebral edema with midline shift 57mm right to left midline shift noted on CT Midline shift less prominent on follow up CT 3% HTS -> 23.4% bolus q6hrs PRN for volume  Na 145->149 Na monitoring Q6h CT repeat pending  Sepsis/urosepsis Septic shock  Fever Leukocytosis Hypotension on pressors UA WBC more than 50 Urine culture E. Coli Blood culture 1/2 staph capitis Tmax 100.4 WBC 19.7-27.6-39.5-55 On cefepime and vancomycin  Respiratory failure Intubated on vent CCM on board Went dyssynchrony On neuromuscular blocker IV  AKI on CKD Significance and elevated creatinine Hyperkalemia On CRRT Change 3% saline to 23.4% saline for volume control Nephrology on board Avoid contrast when possible Renally dose medications as appropriate  Shock liver AST/ALT 1662/957 -> pending INR 1.4 -> pending  Hypotension Home meds:  amlodipine 10 mg daily Stable Keep SBP <160 On levophed and vasopressin now Long-term BP goal normotensive  Other Stroke Risk Factors Cigarette smoker advised to stop smoking History of cocaine abuse Amphetamine abuse Hx stroke  Other Active Problems Schizophrenia  Hospital day # 4  Patient seen and examined by NP/APP with MD. MD to update note as needed.   Janine Ores, DNP, FNP-BC [facility] Pager: [phone]  ATTENDING NOTE: I reviewed above note and agree with the assessment and plan. Pt was seen and examined.   66 year old male with history of hypertension, CKD, smoker, cocaine abuse, stroke, schizophrenia found down in the hotel room.  Intubated for airway protection.  CT showed right BG ICH with cerebral edema, left BG, left temporal lobe small ICH.  Repeat stable hematoma.  AKI on CKD with significant elevated creatinine and hyperkalemia.  Ammonia level 46, LDL 55, A1c 4.2.  UDS positive for amphetamine.  UA WBC> 50, urine culture E. coli.  1/2 blood culture staph capitis.  Significant leukocytosis WBC 55 today  and hypotension on pressors.   On exam, patient paralyzed with neuromuscular blockade.  Left pupil 2 mm, right pupil 3 mm, very sluggish to light.  However no other exam findings due to neuromuscular blockade.  He was on 3% saline but now changed to 23.4 bolus due to fluid overload in the setting of AKI/CKD. Currently on vancomycin and cefepime.  Hypotension on Levophed and vasopressin.  On warming blanket due to hypothermia.  On CRRT for AKI on CKD.  Tmax 100.4.  Patient also found to have shock liver, sepsis, septic shock, in poor condition. Now DNR, but will need further discussion about  GOC. SW is working on the next of kin.   For detailed assessment and plan, please refer to above as I have made changes wherever appropriate.   Rosalin Hawking, MD PhD Stroke Neurology 12/19/2021 5:28 PM  This patient is critically ill due to [place], cerebral edema, AKI on CKD, sepsis, septic shock, shock liver, hyperthermia and at significant risk of neurological worsening, death form brain herniation, renal failure, heart failure, seizure, brain death. This patient's care requires constant monitoring of vital signs, hemodynamics, respiratory and cardiac monitoring, review of multiple databases, neurological assessment, discussion with family, other specialists and medical decision making of high complexity. I spent 30 minutes of neurocritical care time in the care of this patient.  I discussed with CCM Dr. Silas Flood.    To contact Stroke Continuity provider, please refer to [email or web address] After hours, contact General Neurology

## 2021-12-19 NOTE — Progress Notes (Signed)
Impact Progress Note Patient Name: Darryl Parsons DOB: 02-10-1956 MRN: [id number]   Date of Service  12/19/2021  HPI/Events of Note  WBC up to 55 K despite broad coverage  with  Vancomycin + Cefepime, given cavitary lung lesions will add Metronidazole for better anaerobic coverage, and empiric Diflucan  for fungal coverage.  eICU Interventions  See above for antibiotic coverage adjustments. Consult ID in the AM.        Darryl Parsons 12/19/2021, 5:22 AM

## 2021-12-19 NOTE — Progress Notes (Signed)
PT Cancellation/Discharge Note  Patient Details Name: Darryl Parsons MRN: [id number] DOB: 04/20/1956   Cancelled Treatment:    Reason Eval/Treat Not Completed: Patient not medically ready for mobility. PT will sign-off and please reorder if pt becomes able to mobilize.   Arby Barrette, PT Acute Rehabilitation Services  Pager [phone] Office [phone]    Rexanne Mano 12/19/2021, 9:06 AM

## 2021-12-19 NOTE — Progress Notes (Addendum)
NAME:  Darryl Parsons, MRN:  [id number], DOB:  01/05/1956, LOS: 4 ADMISSION DATE:  11/30/2021, CONSULTATION DATE:  12/19/21 REFERRING MD:  Darl Householder, CHIEF COMPLAINT:  unresponsive   History of Present Illness:  66 year old male with prior history of schizophrenia, cocaine abuse, CVA, and CKD who presented to ER as level 1 trauma.  Patient found unresponsive and agonal in a hotel room between the wall and bed with significant swelling to left eye and forehead and pressure wound on left exterior thigh with pinpoint pupils and right gaze.  Last seen 2 days ago.  Given narcan with no change.  On arrival to ER, he was hypotensive 84/50, HR 130, and was intubated on arrival to ER and underwent trauma evaluation.  Found on workup to have acute right basal ganglia hemorrhage with mild edema and left MLS 79mm with smaller acute left basal ganglia hemorrhages, and acute vs subacute left thalamic infarcts, AKI with concern for rhabdo, lactic acidosis, and suspected aspiration pneumonia. He is requiring levophed for ongoing hypotension.  Neuro consulted and PCCM called for admit.   Pertinent  Medical History   has no past medical history on file.   Duplicate chart MRN: [id number]  Visual hallucinations Bipolar/Depression, ?major Cocaine abuse HTn CKD1 Prior GIB in the past 2/2 to internal hemorrhoids Recent admit to [facility] 10.12.--10.17 Has a repeated pattern of OD and ingestion and reported self-harm  Significant Hospital Events: Including procedures, antibiotic start and stop dates in addition to other pertinent events   1/31 Brought in by EMS, intubated, found to have [place], neuro consulted, on cEEG, unasyn started, on HTS  2/1 vanc, unasyn.  2/2 vent dyssynchrony limiting CRRT. Ultimately put on PSV and catheter withdrawn 2cm. E. Coli UTI 2/3 decompensated. SVT --> Afib RVR + shock. Broadened abx to include  cefepime. New HD cath placed. NE, vaso. DNR. Worse hypoxemia, hypercarbia, RASs -4/-5, added  nimbex. Trach aspirate sent. Staph capitis in anaerobic bx only.  2/4 WBC continues to climb, diflucan and flagyl added.   1/31 SARS/ flu > neg 1/31 Bcx2 > Staph capitis x1 1/31 UC > E Coli  1/31 MRSA PCR > positive   1/31 vanc > 1/31 unasyn -2/2 2/3 cefepime > 2/4 diflucan> 2/4 flagyl>   Interim History / Subjective:   Flagyl and diflucan added overnight  On 40 levo and 0.03 vaso Remains hyperkalemic despite changes to CRRT and bicarb pushes   No stool overnight, which is a change from diarrhea 2/3. Overnight hypoactive bowels, now absent sounds  POCUS without free fluid    Objective   Blood pressure 114/79, pulse 66, temperature (!) 97.5 F (36.4 C), resp. rate (!) 28, height 5\' 10"  (1.778 m), weight 73.5 kg, SpO2 100 %.    Vent Mode: PRVC FiO2 (%):  [100 %] 100 % Set Rate:  [20 bmp-30 bmp] 28 bmp Vt Set:  [580 mL] 580 mL PEEP:  [10 cmH20] 10 cmH20 Plateau Pressure:  [16 cmH20-26 cmH20] 26 cmH20   Intake/Output Summary (Last 24 hours) at 12/19/2021 0855 Last data filed at 12/19/2021 0800 Gross per 24 hour  Intake 3568.39 ml  Output 3897 ml  Net -328.61 ml   Filed Weights   11/19/2021 1800 12/17/21 0500 12/18/21 0730  Weight: 56.7 kg 75.3 kg 73.5 kg    General:  critically ill appearing older adult M intubated sedated NAD  HEENT: L sided facial abrasion, bruising. ETT secure. Anicteric sclera. Pink mmm Neuro:   Deeply sedated. Anisocoria-- R pupil  47mm L pupil 15mm. No response to pain  CV: rrr s1s2 no rgm cap refill < 3 sec  PULM:  Symmetrical chest expansion, mechanically ventilated. Scattered rhonchi  GI: Soft slightly distended. Absent bowel sounds. FMS with no interval stool output  GU: foley  Extremities: L radial arterial line. L femoral HD cath. R IJ HD cath. No acute joint deformity  Skin: L sided facial abrasions, Scattered small abrasions on extremities. L abdominal abrasion, dressing intact. L hip full thickness pressure wound, dressing intact   Resolved  Hospital Problem list     Assessment & Plan:   Acute metabolic encephalopathy superimposed on ICH -due to sepsis, polysubstance abuse, acute renal failure w/ uremia, medications  -no evidence of sz -amphetamines in UDS -ammonia mildly elevated, unlikely to cause HE  P -metabolic correction as below  -eventually, TOC for cessation support if clinically becomes appropriate   Acute R basal ganglia and L basal ganglia ICH Thalamic infarct, subacute Cerebral edema, midline shift  Anisocoria -- query related to acidosis vs intracranial process  P -Neuro following -SBP goal < 140  -restarted on 3% 2/4   Acute hypoxemic and hypercarbic respiratory failure ARDS CAP vs aspiration, with LUL cavitation  -2/3 night acute SVT then Afiv RVR, associated hypoxemia -CXR 2/3 with worsening opacities. Concern for possible PE with acute decompensation and gross increase in vent support, however ECHO without evidence of RV failure which would argue against. More likely ARDS physiology  P -Nimbex gtt -RASS -4/-5  -lung protective ventilation  -wean FiO2 to 80%   Acute Afib RVR, now sinus (converted 2/3) SVT, resolved  -likely driven by acute hypoxemia -2/2-2/3 night shift SVT requiring adenosine x2 --> afib RVR req metop, amio, dig  -converted to NSR 2/3 after NMB initiated and hypoxemia improved  P -Cont amio  -Mag goal >2 K goal > 4 -ICU monitoring  Septic shock  -E. Coli UTI, PNA  -no evidence of pericardial effusion, overt RV failure, LV failure on ECHO -- not obstructive or cardiogenic. Possible that initially was intravasc depelete but doubt significantly hypovolemic now. H/H stable.  -Acidosis likely compounding shock  -staph capitis in anaerobic bottle only.  -urine with >100000 ecoli  -given continued delcline abx have been broadened both 2/3 and 2/4  -with continually increasing WBC, and persistent hyperkalemia despite changes to CRRT and bicarb pushes, concern for possible  ischemia . POCUS abdominal exam without free fluid.  P -cont NE and vaso -- MAP goal > 65. -antimicrobials: vanc, cefepime, flagyl, diflucan  -follow micro data  -not stable for transport to CT for CT a/p -- though if became stable enough to consider imaging today, not sure that he would be stable enough to act on a potential intraabdominal process   AKI with uremia Hyperkalemia Hyperphosphatemia P -CRRT per nephro -trend renal function panel  -CRRT via L fem HD cath. Ok to keep R IJ HD cath -- this is non-functional for CRRT purposes and can be used as a TLC.   Iatrogenic hypernatremia Hypocalcemia  P -replace Ca -3% per neuro   Hx HTN -holding home meds with shock   Elevated LFTs, mild  - add on LFTs to 2/4 renal function panel  -trend PRN   Thrombocytopenia, mild -critical illness   Hyperglycemia  - likely stress response P -sSSI  Multiple pressure wounds, POA  - WOC, hydrotherapy   Best Practice (right click and "Reselect all SmartList Selections" daily)   Diet/type: NPO, starting TF 2/1 DVT prophylaxis:  SCD GI prophylaxis: PPI Lines: R IJ HD cath, L fem HD cath (ok to use R IJ HD cath as TLC)  Foley:  Yes, and it is still needed Code Status:  DNR Last date of multidisciplinary goals of care discussion [no family available for contact].  Apparently has lived in hotel room for 5+ years and prior notes report living conditions very poor (roof caving in, bed bugs, snakes in room).  No known NOK.  Will consult TOC to help with these issues, dispo will depend on hospital course  Labs   CBC: Recent Labs  Lab 11/15/2021 1616 12/11/2021 1627 12/16/21 0346 12/16/21 0753 12/17/21 0358 12/17/21 0413 12/18/21 0422 12/18/21 0434 12/18/21 1321 12/18/21 1421 12/18/21 1638 12/19/21 0413 12/19/21 0416  WBC 12.7*  --  19.7*  --  27.6*  --  39.5*  --   --   --   --   --  55.0*  HGB 16.5   < > 13.5   < > 11.9*   < > 10.8*   < > 11.9* 10.9* 11.2* 10.9* 9.2*  HCT 50.3    < > 39.7   < > 36.0*   < > 33.6*   < > 35.0* 32.0* 33.0* 32.0* 30.2*  MCV 83.4  --  80.0  --  81.1  --  84.4  --   --   --   --   --  87.0  PLT 238  --  175  --  188  --  144*  --   --   --   --   --  147*   < > = values in this interval not displayed.    Basic Metabolic Panel: Recent Labs  Lab 12/16/21 0346 12/16/21 0753 12/17/21 0358 12/17/21 0413 12/17/21 1550 12/17/21 2237 12/18/21 0422 12/18/21 0434 12/18/21 1621 12/18/21 1638 12/18/21 2214 12/19/21 0000 12/19/21 0413 12/19/21 0416  NA 152*   < > 146*   < > 149*   151*   < > 147*   < > 146* 143 145 145 145 145   144  K 4.3   < > 4.7   < > 5.0  --  4.7   < > 6.3* 6.2*  --  5.7* 5.6* 5.7*  CL 114*   < > 117*  --  114*  --  112*  --  108  --   --  109  --  108  CO2 20*   < > 18*  --  20*  --  21*  --  24  --   --  22  --  21*  GLUCOSE 145*   < > 171*  --  175*  --  177*  --  159*  --   --  163*  --  171*  BUN 209*   < > 176*  --  174*  --  149*  --  127*  --   --  122*  --  116*  CREATININE 6.88*   < > 5.49*  --  5.92*  --  4.62*  --  4.06*  --   --  3.68*  --  3.60*  CALCIUM 7.5*   < > 7.4*  --  7.5*  --  7.6*  --  7.6*  --   --  7.4*  --  7.4*  MG 3.4*  --  3.2*  --   --   --  2.9*  --   --   --   --   --   --  2.7*  PHOS 6.4*   < > 4.4  --  5.6*  --  5.0*  --  8.3*  --   --  6.4*  --  6.7*   < > = values in this interval not displayed.   GFR: Estimated Creatinine Clearance: 21.4 mL/min (A) (by C-G formula based on SCr of 3.6 mg/dL (H)). Recent Labs  Lab 12/09/2021 1616 11/29/2021 1926 12/16/21 0346 12/17/21 0358 12/18/21 0422 12/19/21 0416  WBC 12.7*  --  19.7* 27.6* 39.5* 55.0*  LATICACIDVEN 6.6* 3.5*  --   --   --   --     Liver Function Tests: Recent Labs  Lab 11/16/2021 1616 12/16/21 0346 12/16/21 1553 12/17/21 0358 12/17/21 1550 12/18/21 0422 12/18/21 1621 12/19/21 0000 12/19/21 0416  AST 46* 93*  --  110*  --   --   --   --   --   ALT 22 57*  --  71*  --   --   --   --   --   ALKPHOS 53 38  --  44   --   --   --   --   --   BILITOT 1.5* 1.2  --  1.8*  --   --   --   --   --   PROT 6.9 5.1*  --  5.4*  --   --   --   --   --   ALBUMIN 2.3* 1.7*   < > 1.6*   1.5* 1.6* 1.5* 1.5* <1.5* <1.5*   < > = values in this interval not displayed.   No results for input(s): LIPASE, AMYLASE in the last 168 hours. Recent Labs  Lab 12/16/21 0346 12/17/21 0358  AMMONIA 43* 46*    ABG    Component Value Date/Time   PHART 7.316 (L) 12/19/2021 0413   PCO2ART 48.9 (H) 12/19/2021 0413   PO2ART 150 (H) 12/19/2021 0413   HCO3 25.1 12/19/2021 0413   TCO2 27 12/19/2021 0413   ACIDBASEDEF 2.0 12/19/2021 0413   O2SAT 99.0 12/19/2021 0413     Coagulation Profile: Recent Labs  Lab 12/01/2021 1616  INR 1.4*    Cardiac Enzymes: Recent Labs  Lab 11/25/2021 1616 12/16/21 0346 12/17/21 0358  CKTOTAL 1,079* 992* 902*  CKMB 21.1*  --   --     HbA1C: Hgb A1c MFr Bld  Date/Time Value Ref Range Status  11/15/2021 07:26 PM 4.2 (L) 4.8 - 5.6 % Final    Comment:    (NOTE)         Prediabetes: 5.7 - 6.4         Diabetes: >6.4         Glycemic control for adults with diabetes: <7.0     CBG: Recent Labs  Lab 12/18/21 1535 12/18/21 1947 12/19/21 0003 12/19/21 0415 12/19/21 0732  GLUCAP 145* 132* 119* 155* 137*    CRITICAL CARE Performed by: Cristal Generous   Total critical care time: 50 minutes  Critical care time was exclusive of separately billable procedures and treating other patients.  Critical care was necessary to treat or prevent imminent or life-threatening deterioration.  Critical care was time spent personally by me on the following activities: development of treatment plan with patient and/or surrogate as well as nursing, discussions with consultants, evaluation of patient's response to treatment, examination of patient, obtaining history from patient or surrogate, ordering and performing treatments and interventions, ordering and review of laboratory studies, ordering and  review  of radiographic studies, pulse oximetry and re-evaluation of patient's condition.  [name] MSN, AGACNP-BC [place] for pager  12/19/2021, 8:55 AM

## 2021-12-19 NOTE — Progress Notes (Signed)
Nephrology made aware around 2000 that pt's potassium was 6.3 around 1621 and that the CRRT fluids are the normal 4K/2.5Ca. New orders to change all fluids to 2K/2.5Ca.   Nephrology notified that around 0416 potassium had only came down to 5.7. New order to change the dialysate only fluid on CRRT to 0K/2.5Ca and keep the pre and post fluids 2K/2.5Ca.

## 2021-12-19 NOTE — Progress Notes (Addendum)
Pt cuff bp dropped to 61/38 (46) aline 57/43(49). Dr. Joylene Grapes notified. Orders to stop CRRT given. Contact if map improves to see if able to restart later. Decision made earlier per CCM team of no escalation of care (not adding another pressor). Dr. Tamala Julian with CCM also notified. Will continue to monitor.

## 2021-12-20 DIAGNOSIS — N179 Acute kidney failure, unspecified: Secondary | ICD-10-CM | POA: Diagnosis present

## 2021-12-20 DIAGNOSIS — J80 Acute respiratory distress syndrome: Secondary | ICD-10-CM | POA: Diagnosis not present

## 2021-12-20 DIAGNOSIS — Z66 Do not resuscitate: Secondary | ICD-10-CM | POA: Diagnosis not present

## 2021-12-20 DIAGNOSIS — J189 Pneumonia, unspecified organism: Secondary | ICD-10-CM | POA: Diagnosis present

## 2021-12-20 DIAGNOSIS — A419 Sepsis, unspecified organism: Secondary | ICD-10-CM | POA: Diagnosis present

## 2021-12-20 DIAGNOSIS — E875 Hyperkalemia: Secondary | ICD-10-CM | POA: Diagnosis present

## 2021-12-20 LAB — METHYLMALONIC ACID, SERUM: Methylmalonic Acid, Quantitative: 628 nmol/L — ABNORMAL HIGH (ref 0–378)

## 2021-12-20 LAB — CULTURE, BLOOD (ROUTINE X 2)
Culture: NO GROWTH
Special Requests: ADEQUATE

## 2021-12-21 ENCOUNTER — Encounter: Payer: Self-pay | Admitting: Critical Care Medicine

## 2021-12-21 LAB — CULTURE, RESPIRATORY W GRAM STAIN

## 2022-01-13 NOTE — Progress Notes (Signed)
Patient went asystole at [place]. This RN and Leticia Clas, RN auscultated no heart sounds. TOD 0010.   275 mL of fentanyl and 25 mL of versed wasted in the stericycle.  Dr. Lucile Shutters with CCM and Dr. Alease Medina with Neurology notified of patient's TOD.

## 2022-01-13 NOTE — Death Summary Note (Signed)
DEATH SUMMARY   Patient Details  Name: Darryl Parsons MRN: [id number] DOB: [birth year]  Admission/Discharge Information   Admit Date:  2022-01-04  Date of Death: Date of Death: 09-Jan-2022  Time of Death: Time of Death: 02-12-2023  Length of Stay: 5  Referring Physician: Pcp, No   Reason(s) for Hospitalization  Intracerebral hemorrhage  Diagnoses  Preliminary cause of death: septic shock Secondary Diagnoses (including complications and co-morbidities):  Principal Problem:   ICH (intracerebral hemorrhage) ([place]) Active Problems:   Pressure injury of skin   Protein-calorie malnutrition, severe   Septic shock ([place])   DNR (do not resuscitate)   Acute renal failure (ARF) ([place])   Hyperkalemia   Community acquired pneumonia   ARDS (adult respiratory distress syndrome) ([place])   Brief Hospital Course (including significant findings, care, treatment, and services provided and events leading to death)  Darryl Parsons is a 66 y.o. year old male who Was brought to the ED by EMS agonal breathing, unresponsive in the hotel room found to have intracerebral hemorrhage.  Acute renal failure on CRRT started on admission.  Hypoxemic respiratory failure due to pneumonia.  Ventilator dependent.  Course complicated by septic shock.  On vasopressors.  Treated aggressively for pneumonia.  Had acute decompensation with A-fib/RVR early morning 2/3.  Escalated O2 manipulated to 100% increase PEEP.  Chest x-ray showed new right-sided infiltrate.  Chest x-ray, course is consistent with ARDS.  Severe ARDS with PF ratio less than 100.  Sedation was increased, paralyzed.  Hypercarbic and not ventilating well.  This improved with ventilator changes as well as paralysis as above.  However, he continues developed worsening septic shock.  Antibiotics were broadened from vancomycin and Unasyn to vancomycin, cefepime, Flagyl, fluconazole.  Cultures remained no growth to date.  Given multiorgan failure multi physician meeting was  held and he was made DNR.  No escalation of care was decided.  He has no next of kin, no decision maker is able to be contacted.  Social work consult was placed but time of death no next of kin has been identified.  He passed away on full support early in the morning January 09, 2022.    Pertinent Labs and Studies  Significant Diagnostic Studies DG Forearm Left  Result Date: Jan 04, 2022 CLINICAL DATA:  [place], swelling EXAM: LEFT FOREARM - 2 VIEW COMPARISON:  None. FINDINGS: Frontal and lateral views of the left forearm are obtained. No fracture, subluxation, or dislocation. Joint spaces appear well preserved. There is diffuse soft tissue edema. IMPRESSION: 1. Diffuse soft tissue swelling. 2. No acute fracture. Electronically Signed   By: Randa Ngo M.D.   On: 01/04/22 17:16   CT HEAD WO CONTRAST (5MM)  Result Date: 12/17/2021 CLINICAL DATA:  Altered mental status EXAM: CT HEAD WITHOUT CONTRAST TECHNIQUE: Contiguous axial images were obtained from the base of the skull through the vertex without intravenous contrast. RADIATION DOSE REDUCTION: This exam was performed according to the departmental dose-optimization program which includes automated exposure control, adjustment of the mA and/or kV according to patient size and/or use of iterative reconstruction technique. COMPARISON:  01-04-22 FINDINGS: Brain: Unchanged intraparenchymal hemorrhage within the right basal ganglia, centered on the lentiform nuclei, measuring 4.3 x 2.5 cm (series 2, image 19). Adjacent edema is similar as is mass effect on the right lateral ventricle and mild right-to-left midline shift of no greater than 0.5 cm. Additional punctuate hemorrhage within the left temporal pole, unchanged (series 2, image 13) unchanged encephalomalacia of the left cerebellar hemisphere. Vascular: No hyperdense  vessel or unexpected calcification. Skull: Normal. Negative for fracture or focal lesion. Sinuses/Orbits: Small air-fluid level in the left  maxillary sinus Other: None. IMPRESSION: 1. Unchanged intraparenchymal hemorrhage within the right basal ganglia, centered on the lentiform nuclei, measuring 4.3 x 2.5 cm. Adjacent edema is similar as is mass effect on the right lateral ventricle and mild right-to-left midline shift of no greater than 0.5 cm. 2. Additional punctuate hemorrhage within the left temporal pole, unchanged. 3. Other small foci of hemorrhage previously described are not well appreciated on current CT. Electronically Signed   By: Delanna Ahmadi M.D.   On: 12/17/2021 10:59   CT HEAD WO CONTRAST  Result Date: 11/27/2021 CLINICAL DATA:  Follow-up parenchymal hemorrhage EXAM: CT HEAD WITHOUT CONTRAST TECHNIQUE: Contiguous axial images were obtained from the base of the skull through the vertex without intravenous contrast. RADIATION DOSE REDUCTION: This exam was performed according to the departmental dose-optimization program which includes automated exposure control, adjustment of the mA and/or kV according to patient size and/or use of iterative reconstruction technique. COMPARISON:  CT from earlier in the same day. FINDINGS: Brain: Stable appearing hemorrhage is noted in the region of the right basal ganglia measuring up to 4.1 cm in greatest dimension. Some surrounding edema is again identified. Very minimal midline shift is noted from right to left which appears slightly less than that seen on the prior exam. The previously seen left basal ganglia and right thalamic hemorrhages are less well visualized than on the previous study. The left temporal parenchymal hemorrhage is stable. Cerebellar infarcts are noted worse on the left than the right similar to that noted on the prior exam. No new infarct is seen. Vascular: No hyperdense vessel or unexpected calcification. Skull: Normal. Negative for fracture or focal lesion. Sinuses/Orbits: Small air-fluid level is again noted in the left maxillary antrum. Mild mucosal thickening is noted in  the ethmoid sinuses. Other: None IMPRESSION: Stable appearing right basal ganglia hemorrhage when compared with the prior exam. Surrounding edema and mild midline shift from right to left is noted although the midline shift appears less prominent than on the prior exam. Previously seen left basal ganglia and right thalamic foci of hemorrhage are less well appreciated on this exam. The left temporal parenchymal hemorrhage is stable. Electronically Signed   By: Inez Catalina M.D.   On: 12/10/2021 23:55   CT HEAD WO CONTRAST (5MM)  Result Date: 12/05/2021 CLINICAL DATA:  Level 1 trauma.  Found unresponsive. EXAM: CT HEAD WITHOUT CONTRAST CT MAXILLOFACIAL WITHOUT CONTRAST CT CERVICAL SPINE WITHOUT CONTRAST TECHNIQUE: Multidetector CT imaging of the head, cervical spine, and maxillofacial structures were performed using the standard protocol without intravenous contrast. Multiplanar CT image reconstructions of the cervical spine and maxillofacial structures were also generated. RADIATION DOSE REDUCTION: This exam was performed according to the departmental dose-optimization program which includes automated exposure control, adjustment of the mA and/or kV according to patient size and/or use of iterative reconstruction technique. COMPARISON:  Head CT 03/17/2017 FINDINGS: CT HEAD FINDINGS Brain: An acute hemorrhage involving the right lentiform nucleus/external capsule measures 4.1 x 2.4 x 3.2 cm (approximate volume 16 mL). There is mild surrounding edema with mass effect on the right lateral ventricle and 4 mm of leftward midline shift. A smaller acute hemorrhage anteriorly in the left basal ganglia/anterior limb of left internal capsule measures 1.5 cm with minimal surrounding edema. A small acute right thalamic hemorrhage measures 5 mm. There is also a 4 mm acute hemorrhage anteriorly in the left  temporal lobe without significant edema. No acute cortically based infarct or extra-axial fluid collection is identified.  There is no hydrocephalus. There are chronic left larger than right cerebellar infarcts. Left thalamic lacunar infarcts are new and have a more chronic appearance anteriorly and more acute to subacute appearance posteriorly. Patchy hypodensities in the cerebral white matter bilaterally are nonspecific but compatible with mild-to-moderate chronic small vessel ischemic disease. Vascular: No hyperdense vessel. Skull: No acute calvarial fracture.  Left frontal scalp swelling. Other: None. CT MAXILLOFACIAL FINDINGS Osseous: No acute fracture or mandibular dislocation. Remote blowout fracture involving the medial wall and floor of the left orbit, also present in 2018. Multiple missing teeth. Periapical lucency involving the left mandibular central incisor with evidence of prior root canal. Orbits: Left periorbital soft tissue swelling. Grossly intact globes. No retrobulbar hematoma. Sinuses: Opacification of a hypoplastic left frontal sinus and of multiple left ethmoid air cells. Mucosal thickening and small volume fluid in the left maxillary sinus. Clear mastoid air cells. Soft tissues: Left facial soft tissue swelling diffusely. CT CERVICAL SPINE FINDINGS Alignment: Cervical spine straightening.  No listhesis. Skull base and vertebrae: No acute fracture or suspicious osseous lesion. Soft tissues and spinal canal: No prevertebral fluid or swelling. No visible canal hematoma. Disc levels: Mild-to-moderate cervical spondylosis. Moderate to severe right neural foraminal stenosis at C3-4 due to asymmetric uncovertebral spurring. Upper chest: Reported separately. Other: Partially visualized endotracheal and enteric tubes. IMPRESSION: 1. 4 cm acute right basal ganglia hemorrhage with mild edema and 4 mm of leftward midline shift. 2. Smaller acute hemorrhages in the left basal ganglia, right thalamus, and left temporal lobe. 3. Suspected acute/subacute left thalamic infarct. 4. Chronic cerebellar infarcts. 5. Left-sided scalp  and facial soft tissue swelling. 6. No acute maxillofacial or cervical spine fracture. 7. Remote left orbital fracture. Critical Value/emergent results were reviewed in person with Dr. Georganna Skeans on 11/19/2021 at 4:50 p.m. Electronically Signed   By: Logan Bores M.D.   On: 11/24/2021 17:15   CT CERVICAL SPINE WO CONTRAST  Result Date: 12/09/2021 CLINICAL DATA:  Level 1 trauma.  Found unresponsive. EXAM: CT HEAD WITHOUT CONTRAST CT MAXILLOFACIAL WITHOUT CONTRAST CT CERVICAL SPINE WITHOUT CONTRAST TECHNIQUE: Multidetector CT imaging of the head, cervical spine, and maxillofacial structures were performed using the standard protocol without intravenous contrast. Multiplanar CT image reconstructions of the cervical spine and maxillofacial structures were also generated. RADIATION DOSE REDUCTION: This exam was performed according to the departmental dose-optimization program which includes automated exposure control, adjustment of the mA and/or kV according to patient size and/or use of iterative reconstruction technique. COMPARISON:  Head CT 03/17/2017 FINDINGS: CT HEAD FINDINGS Brain: An acute hemorrhage involving the right lentiform nucleus/external capsule measures 4.1 x 2.4 x 3.2 cm (approximate volume 16 mL). There is mild surrounding edema with mass effect on the right lateral ventricle and 4 mm of leftward midline shift. A smaller acute hemorrhage anteriorly in the left basal ganglia/anterior limb of left internal capsule measures 1.5 cm with minimal surrounding edema. A small acute right thalamic hemorrhage measures 5 mm. There is also a 4 mm acute hemorrhage anteriorly in the left temporal lobe without significant edema. No acute cortically based infarct or extra-axial fluid collection is identified. There is no hydrocephalus. There are chronic left larger than right cerebellar infarcts. Left thalamic lacunar infarcts are new and have a more chronic appearance anteriorly and more acute to subacute  appearance posteriorly. Patchy hypodensities in the cerebral white matter bilaterally are nonspecific but compatible with  mild-to-moderate chronic small vessel ischemic disease. Vascular: No hyperdense vessel. Skull: No acute calvarial fracture.  Left frontal scalp swelling. Other: None. CT MAXILLOFACIAL FINDINGS Osseous: No acute fracture or mandibular dislocation. Remote blowout fracture involving the medial wall and floor of the left orbit, also present in 2018. Multiple missing teeth. Periapical lucency involving the left mandibular central incisor with evidence of prior root canal. Orbits: Left periorbital soft tissue swelling. Grossly intact globes. No retrobulbar hematoma. Sinuses: Opacification of a hypoplastic left frontal sinus and of multiple left ethmoid air cells. Mucosal thickening and small volume fluid in the left maxillary sinus. Clear mastoid air cells. Soft tissues: Left facial soft tissue swelling diffusely. CT CERVICAL SPINE FINDINGS Alignment: Cervical spine straightening.  No listhesis. Skull base and vertebrae: No acute fracture or suspicious osseous lesion. Soft tissues and spinal canal: No prevertebral fluid or swelling. No visible canal hematoma. Disc levels: Mild-to-moderate cervical spondylosis. Moderate to severe right neural foraminal stenosis at C3-4 due to asymmetric uncovertebral spurring. Upper chest: Reported separately. Other: Partially visualized endotracheal and enteric tubes. IMPRESSION: 1. 4 cm acute right basal ganglia hemorrhage with mild edema and 4 mm of leftward midline shift. 2. Smaller acute hemorrhages in the left basal ganglia, right thalamus, and left temporal lobe. 3. Suspected acute/subacute left thalamic infarct. 4. Chronic cerebellar infarcts. 5. Left-sided scalp and facial soft tissue swelling. 6. No acute maxillofacial or cervical spine fracture. 7. Remote left orbital fracture. Critical Value/emergent results were reviewed in person with Dr. Georganna Skeans on  11/26/2021 at 4:50 p.m. Electronically Signed   By: Logan Bores M.D.   On: 12/01/2021 17:15   DG Pelvis Portable  Result Date: 11/26/2021 CLINICAL DATA:  Trauma, fall. EXAM: PORTABLE PELVIS 1-2 VIEWS COMPARISON:  None. FINDINGS: Right hip is internally rotated which limits evaluation. There is no evidence of pelvic fracture or diastasis. No pelvic bone lesions are seen. IVC filter is present. IMPRESSION: Negative. Electronically Signed   By: Ronney Asters M.D.   On: 12/09/2021 17:17   DG CHEST PORT 1 VIEW  Result Date: 12/19/2021 CLINICAL DATA:  Pneumonia. EXAM: PORTABLE CHEST 1 VIEW COMPARISON:  12/18/2021 FINDINGS: 0542 hours. Endotracheal tube tip is 4.9 cm above the base of the carina. NG tube is looped in the stomach with the tip in the fundal region. Right IJ central line tip overlies the region of the innominate vein confluence. Lungs appear hyperexpanded. Right lung clear. Patchy airspace disease left mid and lower lung is similar to prior although aeration appears improved at the left base. Telemetry leads overlie the chest. IMPRESSION: 1. Interval improvement in aeration at the left base. Otherwise no change in exam. 2. Support apparatus as described. Electronically Signed   By: Misty Stanley M.D.   On: 12/19/2021 08:08   DG CHEST PORT 1 VIEW  Result Date: 12/18/2021 CLINICAL DATA:  ETT placement EXAM: PORTABLE CHEST 1 VIEW COMPARISON:  None. FINDINGS: Endotracheal tube tip overlies the midthoracic trachea. Nasogastric tube passed below the diaphragm, tip and side port overlying the stomach. Right neck central venous catheter tip overlies the mid superior vena cava. Unchanged cardiomediastinal silhouette. There is multifocal airspace disease bilaterally most prominent in the left mid to lower lung. Probable small left pleural effusion. No visible pneumothorax. IMPRESSION: Endotracheal tube tip overlies the midthoracic trachea. Unchanged bilateral mid to lower lung airspace disease, more confluent  on the left. Electronically Signed   By: Maurine Simmering M.D.   On: 12/18/2021 16:25   DG Chest Portable 1 View  Result Date: 12/18/2021 CLINICAL DATA:  Tube verification.  Endotracheal tube verification. EXAM: PORTABLE CHEST 1 VIEW COMPARISON:  Radiographs 12/18/2021 and 12/17/2021.  CT 12/09/2021. FINDINGS: 0936 hours. The endotracheal tube overlaps the enteric tube in the upper chest, although appears to extend to the level of the clavicular heads. Enteric tube projects below the diaphragm, tip not visualized. Right IJ central venous catheter is unchanged at the level of the mid SVC. Multiple overlying telemetry leads are present. The heart size and mediastinal contours are stable. Volume loss in the left hemithorax and associated airspace opacities are unchanged. There is new patchy airspace disease at the right lung base. No evidence of pneumothorax or significant pleural effusion. IMPRESSION: 1. Satisfactory position of the support system and lines. Tip of the endotracheal tube is partly obscured by overlapping enteric tube, although is located at approximately the level of the clavicular heads. 2. New right lung airspace opacities suspicious for aspiration. Previously demonstrated left lung airspace opacities have not significantly changed. Electronically Signed   By: Richardean Sale M.D.   On: 12/18/2021 09:53   DG CHEST PORT 1 VIEW  Result Date: 12/18/2021 CLINICAL DATA:  66 year old male intubated. Found down. Right basal ganglia hemorrhage. EXAM: PORTABLE CHEST 1 VIEW COMPARISON:  Portable chest 12/17/2021 and earlier. FINDINGS: Portable AP semi upright view at 0428 hours. Stable lines and tubes. Patchy and confluent left perihilar and lung base opacity not significantly changed. Stable lung volumes. Right lung now appears clear when allowing for portable technique. Mediastinal contours remain within normal limits. No pneumothorax or pulmonary edema. No acute osseous abnormality identified. IMPRESSION:  1. Stable lines and tubes. 2. Clear right lung. Patchy and confluent left perihilar and lung base opacity not significantly changed. Electronically Signed   By: Genevie Ann M.D.   On: 12/18/2021 06:00   DG CHEST PORT 1 VIEW  Result Date: 12/17/2021 CLINICAL DATA:  Respiratory distress EXAM: PORTABLE CHEST 1 VIEW COMPARISON:  December 16, 2021, December 15, 2021 FINDINGS: The cardiomediastinal silhouette is unchanged in contour.ETT tip terminates 5 cm above the carina. The enteric tube courses through the chest to the abdomen beyond the field-of-view. RIGHT neck CVC tip terminates over the expected region of the SVC. Trace LEFT pleural effusion. No pneumothorax. Multifocal airspace opacities throughout the LEFT lung in the lower lung predominant distribution. There are few patchy opacities at the RIGHT lung base which are increased in comparison to prior. Cavitary area projects over the mid LEFT lung is better assessed on recent CT and is estimated to measure 3 cm. IMPRESSION: 1.  Support apparatus as described above. 2. LEFT greater than RIGHT airspace opacities likely reflecting multifocal infection, most likely of aspiration etiology. There is a cavitary area in the LEFT lung which is grossly similar in comparison to prior chest CT. Electronically Signed   By: Valentino Saxon M.D.   On: 12/17/2021 08:01   DG CHEST PORT 1 VIEW  Result Date: 12/16/2021 CLINICAL DATA:  Central line placement EXAM: PORTABLE CHEST 1 VIEW COMPARISON:  12/02/2021 FINDINGS: New right IJ central line tip overlies SVC. Endotracheal and enteric tubes are again identified. Persistent left lung opacities with improved aeration in the upper lung and worsening aeration at the lung base compared to the prior study. No significant pleural effusion. No pneumothorax. Stable cardiomediastinal contours. IMPRESSION: New right IJ central line tip overlies SVC.  No pneumothorax. Left lung opacities. Improved aeration in the upper lung and worsening  aeration at the lung base. Electronically Signed  By: Macy Mis M.D.   On: 12/16/2021 11:55   DG Chest Port 1 View  Result Date: 12/14/2021 CLINICAL DATA:  Level 1 trauma, overdose EXAM: PORTABLE CHEST 1 VIEW COMPARISON:  05/23/2021 FINDINGS: Endotracheal tube with the tip 4.7 cm above the carina. Nasogastric tube coursing below the diaphragm. Right lung is clear. Patchy airspace disease throughout the left lung which may reflect multilobar pneumonia versus aspiration pneumonia versus pulmonary contusions. No pneumothorax. Stable cardiomediastinal silhouette. No acute osseous abnormality. IMPRESSION: 1. Endotracheal tube with the tip 4.7 cm above the carina. 2. Nasogastric tube coursing below the diaphragm with the tip excluded from the field of view. 3. Patchy airspace disease throughout the left lung which may reflect multilobar pneumonia versus aspiration pneumonia versus pulmonary contusions. Electronically Signed   By: Kathreen Devoid M.D.   On: 11/26/2021 16:32   DG Humerus Left  Result Date: 12/05/2021 CLINICAL DATA:  [place], swelling EXAM: LEFT HUMERUS - 2+ VIEW COMPARISON:  None. FINDINGS: Frontal and lateral views of the left humerus are obtained. No acute fracture. Alignment of the left shoulder and elbow is anatomic. Soft tissues are unremarkable. Visualized portions of the left chest demonstrate stable airspace disease. IMPRESSION: 1. Unremarkable left humerus. 2. Stable left-sided airspace disease. Electronically Signed   By: Randa Ngo M.D.   On: 11/16/2021 17:14   Overnight EEG with video  Result Date: 12/16/2021 Lora Havens, MD     12/16/2021 12:50 PM Patient Name: Darryl Parsons MRN: [id number] Epilepsy Attending: Lora Havens Referring Physician/Provider: Donnetta Simpers, MD Duration: 12/16/2021 0322 to 1155 Patient history: 66 year old male with multiple subcortical ICH with altered mental status.  EEG evaluate for seizure. Level of alertness: comatose AEDs during EEG  study: None Technical aspects: This EEG study was done with scalp electrodes positioned according to the 10-20 International system of electrode placement. Electrical activity was acquired at a sampling rate of 500Hz  and reviewed with a high frequency filter of 70Hz  and a low frequency filter of 1Hz . EEG data were recorded continuously and digitally stored. Description: EEG showed continuous generalized and lateralized right hemisphere 3 to 7 Hz theta-delta slowing. Hyperventilation and photic stimulation were not performed.   ABNORMALITY - Continuous slow, generalized and lateralized right hemisphere IMPRESSION: This study is suggestive of cortical dysfunction arising from right hemisphere likely secondary to underlying structural abnormality/bleed.  Additionally there is moderate to severe diffuse encephalopathy, nonspecific to etiology.  No seizures or epileptiform discharges were seen throughout the recording. Lora Havens   ECHOCARDIOGRAM COMPLETE  Result Date: 12/16/2021    ECHOCARDIOGRAM REPORT   Patient Name:   Darryl Parsons Date of Exam: 12/16/2021 Medical Rec #:  [id number]        Height:       70.0 in Accession #:    [id number]       Weight:       125.0 lb Date of Birth:  Jan 06, 1956        BSA:          1.709 m Patient Age:    22 years         BP:           123/85 mmHg Patient Gender: M                HR:           110 bpm. Exam Location:  Inpatient Procedure: 2D Echo, Cardiac Doppler, Color Doppler and Strain Analysis Indications:    CVA  History:        Patient has no prior history of Echocardiogram examinations.  Sonographer:    [facility] Referring Phys: [id number] [place]  1. Left ventricular ejection fraction, by estimation, is 60 to 65%. The left ventricle has normal function. The left ventricle has no regional wall motion abnormalities. There is severe concentric left ventricular hypertrophy. No evidence of increased LVOT gradient. Left ventricular diastolic parameters are consistent  with Grade I diastolic dysfunction (impaired relaxation).  2. Right ventricular systolic function is normal. The right ventricular size is normal.  3. The mitral valve is normal in structure. No evidence of mitral valve regurgitation. No evidence of mitral stenosis.  4. The aortic valve is normal in structure. Aortic valve regurgitation is not visualized. No aortic stenosis is present.  5. The inferior vena cava is normal in size with greater than 50% respiratory variability, suggesting right atrial pressure of 3 mmHg. Comparison(s): No prior Echocardiogram. FINDINGS  Left Ventricle: Left ventricular ejection fraction, by estimation, is 60 to 65%. The left ventricle has normal function. The left ventricle has no regional wall motion abnormalities. Global longitudinal strain performed but not reported based on interpreter judgement due to suboptimal tracking. The left ventricular internal cavity size was small. There is severe concentric left ventricular hypertrophy. Left ventricular diastolic parameters are consistent with Grade I diastolic dysfunction (impaired relaxation). Right Ventricle: The right ventricular size is normal. No increase in right ventricular wall thickness. Right ventricular systolic function is normal. Left Atrium: Left atrial size was normal in size. Right Atrium: Right atrial size was normal in size. Pericardium: There is no evidence of pericardial effusion. Mitral Valve: The mitral valve is normal in structure. No evidence of mitral valve regurgitation. No evidence of mitral valve stenosis. Tricuspid Valve: The tricuspid valve is normal in structure. Tricuspid valve regurgitation is trivial. No evidence of tricuspid stenosis. Aortic Valve: The aortic valve is normal in structure. Aortic valve regurgitation is not visualized. No aortic stenosis is present. Aortic valve mean gradient measures 6.0 mmHg. Aortic valve peak gradient measures 13.1 mmHg. Aortic valve area, by VTI measures 3.75 cm.  Pulmonic Valve: The pulmonic valve was normal in structure. Pulmonic valve regurgitation is not visualized. No evidence of pulmonic stenosis. Aorta: The aortic root is normal in size and structure. Venous: The inferior vena cava is normal in size with greater than 50% respiratory variability, suggesting right atrial pressure of 3 mmHg. IAS/Shunts: No atrial level shunt detected by color flow Doppler.  LEFT VENTRICLE PLAX 2D LVIDd:         3.30 cm     Diastology LVIDs:         1.90 cm     LV e' medial:    3.59 cm/s LV PW:         1.90 cm     LV E/e' medial:  15.2 LV IVS:        1.60 cm     LV e' lateral:   5.55 cm/s LVOT diam:     2.10 cm     LV E/e' lateral: 9.8 LV SV:         78 LV SV Index:   45 LVOT Area:     3.46 cm  LV Volumes (MOD) LV vol d, MOD A2C: 58.1 ml LV vol d, MOD A4C: 64.2 ml LV vol s, MOD A2C: 26.1 ml LV vol s, MOD A4C: 23.9 ml LV SV MOD A2C:     32.0 ml LV SV MOD  A4C:     64.2 ml LV SV MOD BP:      35.9 ml RIGHT VENTRICLE RV Basal diam:  2.60 cm RV Mid diam:    1.80 cm RV S prime:     9.85 cm/s TAPSE (M-mode): 1.3 cm LEFT ATRIUM           Index        RIGHT ATRIUM           Index LA diam:      1.90 cm 1.11 cm/m   RA Area:     14.30 cm LA Vol (A2C): 21.5 ml 12.58 ml/m  RA Volume:   34.20 ml  20.01 ml/m LA Vol (A4C): 42.6 ml 24.92 ml/m  AORTIC VALVE                     PULMONIC VALVE AV Area (Vmax):    3.25 cm      PV Vmax:       1.24 m/s AV Area (Vmean):   3.37 cm      PV Vmean:      83.400 cm/s AV Area (VTI):     3.75 cm      PV VTI:        0.158 m AV Vmax:           181.00 cm/s   PV Peak grad:  6.2 mmHg AV Vmean:          109.000 cm/s  PV Mean grad:  3.0 mmHg AV VTI:            0.207 m AV Peak Grad:      13.1 mmHg AV Mean Grad:      6.0 mmHg LVOT Vmax:         170.00 cm/s LVOT Vmean:        106.000 cm/s LVOT VTI:          0.224 m LVOT/AV VTI ratio: 1.08  AORTA Ao Root diam: 3.20 cm Ao Asc diam:  3.50 cm MITRAL VALVE                TRICUSPID VALVE MV Area (PHT): 3.34 cm     TR Peak grad:    37.0 mmHg MV Decel Time: 227 msec     TR Vmax:        304.00 cm/s MV E velocity: 54.50 cm/s MV A velocity: 102.00 cm/s  SHUNTS MV E/A ratio:  0.53         Systemic VTI:  0.22 m                             Systemic Diam: 2.10 cm Kardie Tobb DO Electronically signed by Berniece Salines DO Signature Date/Time: 12/16/2021/12:42:24 PM    Final    VAS US CAROTID  Result Date: 12/17/2021 Carotid Arterial Duplex Study Patient Name:  Darryl Parsons  Date of Exam:   12/17/2021 Medical Rec #: [id number]         Accession #:    [id number] Date of Birth: [birth year]         Patient Gender: M Patient Age:   60 years Exam Location:  [facility] Procedure:      VAS US CAROTID Referring Phys: Alferd Patee [facility] --------------------------------------------------------------------------------  Indications:       CVA. Risk Factors:      None. Limitations        Today's exam was limited due  to a central line, patient on a                    ventilator and Right neck bandages. Comparison Study:  No prior studies. Performing Technologist: Oliver Hum RVT  Examination Guidelines: A complete evaluation includes B-mode imaging, spectral Doppler, color Doppler, and power Doppler as needed of all accessible portions of each vessel. Bilateral testing is considered an integral part of a complete examination. Limited examinations for reoccurring indications may be performed as noted.  Right Carotid Findings: +----------+--------+--------+--------+------------------+--------------+             PSV cm/s EDV cm/s Stenosis Plaque Description Comments        +----------+--------+--------+--------+------------------+--------------+  CCA Prox                                                 Not visualized  +----------+--------+--------+--------+------------------+--------------+  CCA Distal                                               Not visualized  +----------+--------+--------+--------+------------------+--------------+  ICA Prox                                                  Not visualized  +----------+--------+--------+--------+------------------+--------------+  ICA Distal                                               Not visualized  +----------+--------+--------+--------+------------------+--------------+  ECA                                                      Not visualized  +----------+--------+--------+--------+------------------+--------------+ +----------+--------+-------+--------+-------------------+             PSV cm/s EDV cms Describe Arm Pressure (mmHG)  +----------+--------+-------+--------+-------------------+  Subclavian 80                                             +----------+--------+-------+--------+-------------------+ +---------+--------+--------+--------------+  Vertebral PSV cm/s EDV cm/s Not visualized  +---------+--------+--------+--------------+  Left Carotid Findings: +----------+--------+--------+--------+-----------------------+--------+             PSV cm/s EDV cm/s Stenosis Plaque Description      Comments  +----------+--------+--------+--------+-----------------------+--------+  CCA Prox   127      39                smooth and heterogenous           +----------+--------+--------+--------+-----------------------+--------+  CCA Distal 109      32                smooth and heterogenous           +----------+--------+--------+--------+-----------------------+--------+  ICA Prox   54  14                smooth and heterogenous           +----------+--------+--------+--------+-----------------------+--------+  ICA Distal 88       34                                        tortuous  +----------+--------+--------+--------+-----------------------+--------+  ECA        61       17                                                  +----------+--------+--------+--------+-----------------------+--------+ +----------+--------+--------+--------+-------------------+             PSV cm/s EDV cm/s Describe Arm Pressure (mmHG)   +----------+--------+--------+--------+-------------------+  Subclavian 179                                             +----------+--------+--------+--------+-------------------+ +---------+--------+--+--------+--+---------+  Vertebral PSV cm/s 54 EDV cm/s 21 Antegrade  +---------+--------+--+--------+--+---------+   Summary: Right Carotid: Unable to assess the right sided carotid system due to the                presence of a central line and bandages. Left Carotid: Velocities in the left ICA are consistent with a 1-39% stenosis. Vertebrals: Left vertebral artery demonstrates antegrade flow. *See table(s) above for measurements and observations.  Electronically signed by Antony Contras MD on 12/17/2021 at 1:38:33 PM.    Final    ECHOCARDIOGRAM LIMITED  Result Date: 12/18/2021    ECHOCARDIOGRAM LIMITED REPORT   Patient Name:   Darryl Parsons Date of Exam: 12/18/2021 Medical Rec #:  [id number]        Height: Accession #:    [id number]       Weight: Date of Birth:  06/25/1956        BSA: Patient Age:    37 years         BP:           97/56 mmHg Patient Gender: M                HR:           75 bpm. Exam Location:  Inpatient Procedure: Limited Echo, Cardiac Doppler and Color Doppler STAT ECHO Indications:    Pulmonary embolus  History:        Patient has prior history of Echocardiogram examinations, most                 recent 12/16/2021. Arrythmias:Atrial Fibrillation. Stroke.                 Polysubstance abuse. Acute renal failure.  Sonographer:    Clayton Lefort RDCS (AE) Referring Phys: [name] NP  Sonographer Comments: Echo performed with patient supine and on artificial respirator. Dr. Erskine Emery with PCCM at bedside. IMPRESSIONS  1. Possible mid cavitary gradient due to LVH and hyperdynamic function doppler not well delineated as patient on ventilator . Left ventricular ejection fraction, by estimation, is 60 to 65%. The left ventricle has normal function. The left ventricle has  no regional wall motion  abnormalities. There is severe left ventricular hypertrophy.  2. Right ventricular systolic function is normal. The right ventricular size is normal.  3. The mitral valve is normal in structure. No evidence of mitral valve regurgitation. No evidence of mitral stenosis.  4. The aortic valve is tricuspid. There is mild calcification of the aortic valve. Aortic valve regurgitation is not visualized. Aortic valve sclerosis is present, with no evidence of aortic valve stenosis.  5. The inferior vena cava is normal in size with greater than 50% respiratory variability, suggesting right atrial pressure of 3 mmHg. FINDINGS  Left Ventricle: Possible mid cavitary gradient due to LVH and hyperdynamic function doppler not well delineated as patient on ventilator. Left ventricular ejection fraction, by estimation, is 60 to 65%. The left ventricle has normal function. The left ventricle has no regional wall motion abnormalities. The left ventricular internal cavity size was normal in size. There is severe left ventricular hypertrophy. Right Ventricle: The right ventricular size is normal. No increase in right ventricular wall thickness. Right ventricular systolic function is normal. Left Atrium: Left atrial size was normal in size. Right Atrium: Right atrial size was normal in size. Pericardium: There is no evidence of pericardial effusion. Mitral Valve: The mitral valve is normal in structure. No evidence of mitral valve stenosis. Tricuspid Valve: The tricuspid valve is normal in structure. Tricuspid valve regurgitation is not demonstrated. No evidence of tricuspid stenosis. Aortic Valve: The aortic valve is tricuspid. There is mild calcification of the aortic valve. Aortic valve regurgitation is not visualized. Aortic valve sclerosis is present, with no evidence of aortic valve stenosis. Aortic valve peak gradient measures 10.8 mmHg. Aortic valve area, by VTI measures 3.53 cm. Pulmonic Valve: The pulmonic valve was normal in  structure. Pulmonic valve regurgitation is not visualized. No evidence of pulmonic stenosis. Aorta: The aortic root is normal in size and structure. Venous: The inferior vena cava is normal in size with greater than 50% respiratory variability, suggesting right atrial pressure of 3 mmHg. IAS/Shunts: No atrial level shunt detected by color flow Doppler. LEFT VENTRICLE PLAX 2D LVIDd:         3.40 cm LVIDs:         2.30 cm LV PW:         1.50 cm LV IVS:        1.80 cm LVOT diam:     2.10 cm LV SV:         76 LVOT Area:     3.46 cm  AORTIC VALVE AV Area (Vmax):    3.02 cm AV Area (Vmean):   2.83 cm AV Area (VTI):     3.53 cm AV Vmax:           164.00 cm/s AV Vmean:          108.000 cm/s AV VTI:            0.215 m AV Peak Grad:      10.8 mmHg LVOT Vmax:         143.00 cm/s LVOT Vmean:        88.200 cm/s LVOT VTI:          0.219 m LVOT/AV VTI ratio: 1.02 MV E velocity: 67.60 cm/s MV A velocity: 83.30 cm/s  SHUNTS MV E/A ratio:  0.81        Systemic VTI:  0.22 m  Systemic Diam: 2.10 cm Jenkins Rouge MD Electronically signed by Jenkins Rouge MD Signature Date/Time: 12/18/2021/3:32:48 PM    Final    CT CHEST ABDOMEN PELVIS WO CONTRAST  Result Date: 11/18/2021 CLINICAL DATA:  Level 1 trauma. Found unresponsive, lying left-side-down for prolonged period of time. EXAM: CT CHEST, ABDOMEN AND PELVIS WITHOUT CONTRAST TECHNIQUE: Multidetector CT imaging of the chest, abdomen and pelvis was performed following the standard protocol without IV contrast. RADIATION DOSE REDUCTION: This exam was performed according to the departmental dose-optimization program which includes automated exposure control, adjustment of the mA and/or kV according to patient size and/or use of iterative reconstruction technique. COMPARISON:  Chest radiograph earlier today. CT abdomen and pelvis 09/06/2014. Chest CT 02/21/2010. FINDINGS: CT CHEST FINDINGS Cardiovascular: Limited assessment for acute vascular injury in the absence  of IV contrast. Normal caliber of the thoracic aorta. Normal heart size. No pericardial effusion. Mediastinum/Nodes: No mediastinal hematoma. No enlarged axillary, mediastinal, or hilar lymph nodes identified within limitations of noncontrast technique. Unremarkable thyroid. Endotracheal tube terminating above the carina. Enteric tube coursing into the stomach. Lungs/Pleura: No pleural effusion or pneumothorax. Widespread areas of patchy consolidation throughout the left upper and lower lobes. 2.5 cm region of cavitation within an area of consolidation in the anterior left upper lobe. Bronchial wall thickening with opacification multiple small bronchi in the left upper and lower lobes. Clear right lung. Musculoskeletal: No acute fracture or suspicious osseous lesion. Bilateral glenohumeral arthropathy with calcified bodies in the right subcoracoid region. Partially visualized diffuse soft tissue edema involving the left arm. CT ABDOMEN PELVIS FINDINGS Hepatobiliary: No focal liver abnormality is seen on this unenhanced study. No perihepatic hematoma. Unremarkable gallbladder. No biliary dilatation. Pancreas: Unremarkable. Spleen: Unremarkable. Adrenals/Urinary Tract: Mild bilateral adrenal gland thickening. No evidence of an acute renal injury on this unenhanced study. No perinephric hematoma. Small hypodensities in both kidneys, incompletely characterized given small size and absence of IV contrast though potentially reflecting cysts. No renal calculi or hydronephrosis. Unremarkable bladder. Stomach/Bowel: An enteric tube is looped in the proximal stomach with tip in the region of the gastric cardia. No bowel dilatation or bowel wall thickening is evident. The appendix is unremarkable. Vascular/Lymphatic: Normal caliber of the abdominal aorta. IVC filter in place. No enlarged lymph nodes. Reproductive: Mildly enlarged prostate. Other: No intraperitoneal free fluid or free air. Unchanged small fat containing  umbilical hernia. Musculoskeletal: No acute osseous abnormality or suspicious osseous lesion. Mild disc and moderately advanced facet degeneration in the lumbar spine. IMPRESSION: 1. Widespread patchy consolidation throughout the left lung suspicious for aspiration pneumonia. Small area of cavitation anteriorly in the left upper lobe. 2. No evidence of acute traumatic injury in the abdomen or pelvis. The study was reviewed in person with Dr. Georganna Skeans on 12/05/2021 at 4:50 p.m. Electronically Signed   By: Logan Bores M.D.   On: 12/12/2021 17:29   CT Maxillofacial Wo Contrast  Result Date: 11/23/2021 CLINICAL DATA:  Level 1 trauma.  Found unresponsive. EXAM: CT HEAD WITHOUT CONTRAST CT MAXILLOFACIAL WITHOUT CONTRAST CT CERVICAL SPINE WITHOUT CONTRAST TECHNIQUE: Multidetector CT imaging of the head, cervical spine, and maxillofacial structures were performed using the standard protocol without intravenous contrast. Multiplanar CT image reconstructions of the cervical spine and maxillofacial structures were also generated. RADIATION DOSE REDUCTION: This exam was performed according to the departmental dose-optimization program which includes automated exposure control, adjustment of the mA and/or kV according to patient size and/or use of iterative reconstruction technique. COMPARISON:  Head CT 03/17/2017 FINDINGS: CT  HEAD FINDINGS Brain: An acute hemorrhage involving the right lentiform nucleus/external capsule measures 4.1 x 2.4 x 3.2 cm (approximate volume 16 mL). There is mild surrounding edema with mass effect on the right lateral ventricle and 4 mm of leftward midline shift. A smaller acute hemorrhage anteriorly in the left basal ganglia/anterior limb of left internal capsule measures 1.5 cm with minimal surrounding edema. A small acute right thalamic hemorrhage measures 5 mm. There is also a 4 mm acute hemorrhage anteriorly in the left temporal lobe without significant edema. No acute cortically based  infarct or extra-axial fluid collection is identified. There is no hydrocephalus. There are chronic left larger than right cerebellar infarcts. Left thalamic lacunar infarcts are new and have a more chronic appearance anteriorly and more acute to subacute appearance posteriorly. Patchy hypodensities in the cerebral white matter bilaterally are nonspecific but compatible with mild-to-moderate chronic small vessel ischemic disease. Vascular: No hyperdense vessel. Skull: No acute calvarial fracture.  Left frontal scalp swelling. Other: None. CT MAXILLOFACIAL FINDINGS Osseous: No acute fracture or mandibular dislocation. Remote blowout fracture involving the medial wall and floor of the left orbit, also present in 2018. Multiple missing teeth. Periapical lucency involving the left mandibular central incisor with evidence of prior root canal. Orbits: Left periorbital soft tissue swelling. Grossly intact globes. No retrobulbar hematoma. Sinuses: Opacification of a hypoplastic left frontal sinus and of multiple left ethmoid air cells. Mucosal thickening and small volume fluid in the left maxillary sinus. Clear mastoid air cells. Soft tissues: Left facial soft tissue swelling diffusely. CT CERVICAL SPINE FINDINGS Alignment: Cervical spine straightening.  No listhesis. Skull base and vertebrae: No acute fracture or suspicious osseous lesion. Soft tissues and spinal canal: No prevertebral fluid or swelling. No visible canal hematoma. Disc levels: Mild-to-moderate cervical spondylosis. Moderate to severe right neural foraminal stenosis at C3-4 due to asymmetric uncovertebral spurring. Upper chest: Reported separately. Other: Partially visualized endotracheal and enteric tubes. IMPRESSION: 1. 4 cm acute right basal ganglia hemorrhage with mild edema and 4 mm of leftward midline shift. 2. Smaller acute hemorrhages in the left basal ganglia, right thalamus, and left temporal lobe. 3. Suspected acute/subacute left thalamic  infarct. 4. Chronic cerebellar infarcts. 5. Left-sided scalp and facial soft tissue swelling. 6. No acute maxillofacial or cervical spine fracture. 7. Remote left orbital fracture. Critical Value/emergent results were reviewed in person with Dr. Georganna Skeans on 12/14/2021 at 4:50 p.m. Electronically Signed   By: Logan Bores M.D.   On: 11/29/2021 17:15    Microbiology Recent Results (from the past 240 hour(s))  Blood culture (routine x 2)     Status: None (Preliminary result)   Collection Time: 12/12/2021  4:08 PM   Specimen: BLOOD  Result Value Ref Range Status   Specimen Description BLOOD RIGHT ANTECUBITAL  Final   Special Requests   Final    BOTTLES DRAWN AEROBIC AND ANAEROBIC Blood Culture adequate volume   Culture   Final    NO GROWTH 4 DAYS Performed at [facility], [place] [address] [place], [place] [address]    Report Status PENDING  Incomplete  Blood culture (routine x 2)     Status: Abnormal   Collection Time: 11/18/2021  4:08 PM   Specimen: BLOOD  Result Value Ref Range Status   Specimen Description BLOOD BLOOD RIGHT HAND  Final   Special Requests   Final    BOTTLES DRAWN AEROBIC AND ANAEROBIC Blood Culture adequate volume   Culture  Setup Time   Final  GRAM POSITIVE COCCI ANAEROBIC BOTTLE ONLY CRITICAL RESULT CALLED TO, READ BACK BY AND VERIFIED WITH: V BRYK,[id number]  12/17/21 [place]    Culture (A)  Final    STAPHYLOCOCCUS CAPITIS THE SIGNIFICANCE OF ISOLATING THIS ORGANISM FROM A SINGLE SET OF BLOOD CULTURES WHEN MULTIPLE SETS ARE DRAWN IS UNCERTAIN. PLEASE NOTIFY THE MICROBIOLOGY DEPARTMENT WITHIN ONE WEEK IF SPECIATION AND SENSITIVITIES ARE REQUIRED. Performed at [facility], [place] [address] [place], [place] [address]    Report Status 12/19/2021 FINAL  Final  Blood Culture ID Panel (Reflexed)     Status: Abnormal   Collection Time: 11/23/2021  4:08 PM  Result Value Ref Range Status   Enterococcus faecalis NOT DETECTED NOT DETECTED Final   Enterococcus  Faecium NOT DETECTED NOT DETECTED Final   Listeria monocytogenes NOT DETECTED NOT DETECTED Final   Staphylococcus species DETECTED (A) NOT DETECTED Final    Comment: CRITICAL RESULT CALLED TO, READ BACK BY AND VERIFIED WITH: V BRYK,[id number]  12/17/21 [place]    Staphylococcus aureus (BCID) NOT DETECTED NOT DETECTED Final   Staphylococcus epidermidis NOT DETECTED NOT DETECTED Final   Staphylococcus lugdunensis NOT DETECTED NOT DETECTED Final   Streptococcus species NOT DETECTED NOT DETECTED Final   Streptococcus agalactiae NOT DETECTED NOT DETECTED Final   Streptococcus pneumoniae NOT DETECTED NOT DETECTED Final   Streptococcus pyogenes NOT DETECTED NOT DETECTED Final   A.calcoaceticus-baumannii NOT DETECTED NOT DETECTED Final   Bacteroides fragilis NOT DETECTED NOT DETECTED Final   Enterobacterales NOT DETECTED NOT DETECTED Final   Enterobacter cloacae complex NOT DETECTED NOT DETECTED Final   Escherichia coli NOT DETECTED NOT DETECTED Final   Klebsiella aerogenes NOT DETECTED NOT DETECTED Final   Klebsiella oxytoca NOT DETECTED NOT DETECTED Final   Klebsiella pneumoniae NOT DETECTED NOT DETECTED Final   Proteus species NOT DETECTED NOT DETECTED Final   Salmonella species NOT DETECTED NOT DETECTED Final   Serratia marcescens NOT DETECTED NOT DETECTED Final   Haemophilus influenzae NOT DETECTED NOT DETECTED Final   Neisseria meningitidis NOT DETECTED NOT DETECTED Final   Pseudomonas aeruginosa NOT DETECTED NOT DETECTED Final   Stenotrophomonas maltophilia NOT DETECTED NOT DETECTED Final   Candida albicans NOT DETECTED NOT DETECTED Final   Candida auris NOT DETECTED NOT DETECTED Final   Candida glabrata NOT DETECTED NOT DETECTED Final   Candida krusei NOT DETECTED NOT DETECTED Final   Candida parapsilosis NOT DETECTED NOT DETECTED Final   Candida tropicalis NOT DETECTED NOT DETECTED Final   Cryptococcus neoformans/gattii NOT DETECTED NOT DETECTED Final    Comment: Performed at [facility], [place] [address], [place], [place] [address]  Resp Panel by RT-PCR (Flu A&B, Covid) Nasopharyngeal Swab     Status: None   Collection Time: 11/28/2021  4:16 PM   Specimen: Nasopharyngeal Swab; Nasopharyngeal(NP) swabs in vial transport medium  Result Value Ref Range Status   SARS Coronavirus 2 by RT PCR NEGATIVE NEGATIVE Final    Comment: (NOTE) SARS-CoV-2 target nucleic acids are NOT DETECTED.  The SARS-CoV-2 RNA is generally detectable in upper respiratory specimens during the acute phase of infection. The lowest concentration of SARS-CoV-2 viral copies this assay can detect is 138 copies/mL. A negative result does not preclude SARS-Cov-2 infection and should not be used as the sole basis for treatment or other patient management decisions. A negative result may occur with  improper specimen collection/handling, submission of specimen other than nasopharyngeal swab, presence of viral mutation(s) within the areas targeted by this assay, and inadequate number of  viral copies(<138 copies/mL). A negative result must be combined with clinical observations, patient history, and epidemiological information. The expected result is Negative.  Fact Sheet for Patients:  EntrepreneurPulse.com.au  Fact Sheet for Healthcare Providers:  [employer]  This test is no t yet approved or cleared by the [place] FDA and  has been authorized for detection and/or diagnosis of SARS-CoV-2 by FDA under an Emergency Use Authorization (EUA). This EUA will remain  in effect (meaning this test can be used) for the duration of the COVID-19 declaration under Section 564(b)(1) of the Act, 21 U.S.C.section 360bbb-3(b)(1), unless the authorization is terminated  or revoked sooner.       Influenza A by PCR NEGATIVE NEGATIVE Final   Influenza B by PCR NEGATIVE NEGATIVE Final    Comment: (NOTE) The Xpert Xpress SARS-CoV-2/FLU/RSV plus assay is  intended as an aid in the diagnosis of influenza from Nasopharyngeal swab specimens and should not be used as a sole basis for treatment. Nasal washings and aspirates are unacceptable for Xpert Xpress SARS-CoV-2/FLU/RSV testing.  Fact Sheet for Patients: EntrepreneurPulse.com.au  Fact Sheet for Healthcare Providers: [employer]  This test is not yet approved or cleared by the [place] FDA and has been authorized for detection and/or diagnosis of SARS-CoV-2 by FDA under an Emergency Use Authorization (EUA). This EUA will remain in effect (meaning this test can be used) for the duration of the COVID-19 declaration under Section 564(b)(1) of the Act, 21 U.S.C. section 360bbb-3(b)(1), unless the authorization is terminated or revoked.  Performed at [facility], [place] [address] [place], [place] [address]   Urine Culture     Status: Abnormal   Collection Time: 12/01/2021  5:47 PM   Specimen: Urine, Catheterized  Result Value Ref Range Status   Specimen Description URINE, CATHETERIZED  Final   Special Requests   Final    NONE Performed at [facility], [place] [address], [place], [place] [address]    Culture >=100,000 COLONIES/mL ESCHERICHIA COLI (A)  Final   Report Status 12/17/2021 FINAL  Final   Organism ID, Bacteria ESCHERICHIA COLI (A)  Final      Susceptibility   Escherichia coli - MIC*    AMPICILLIN <=2 SENSITIVE Sensitive     CEFAZOLIN <=4 SENSITIVE Sensitive     CEFEPIME <=0.12 SENSITIVE Sensitive     CEFTRIAXONE <=0.25 SENSITIVE Sensitive     CIPROFLOXACIN <=0.25 SENSITIVE Sensitive     GENTAMICIN <=1 SENSITIVE Sensitive     IMIPENEM <=0.25 SENSITIVE Sensitive     NITROFURANTOIN <=16 SENSITIVE Sensitive     TRIMETH/SULFA <=20 SENSITIVE Sensitive     AMPICILLIN/SULBACTAM <=2 SENSITIVE Sensitive     PIP/TAZO <=4 SENSITIVE Sensitive     * >=100,000 COLONIES/mL ESCHERICHIA COLI  MRSA Next Gen by PCR, Nasal      Status: Abnormal   Collection Time: 12/14/2021  6:59 PM   Specimen: Nasal Mucosa; Nasal Swab  Result Value Ref Range Status   MRSA by PCR Next Gen DETECTED (A) NOT DETECTED Final    Comment: RESULT CALLED TO, READ BACK BY AND VERIFIED WITH: Kennedy Bucker RN 12/10/2021 @2156  BY JW (NOTE) The GeneXpert MRSA Assay (FDA approved for NASAL specimens only), is one component of a comprehensive MRSA colonization surveillance program. It is not intended to diagnose MRSA infection nor to guide or monitor treatment for MRSA infections. Test performance is not FDA approved in patients less than 66 years old. Performed at [facility], [place] [address]  [place], [place] [address]   Expectorated Sputum Assessment w Gram Stain, Rflx to Resp Cult     Status: None   Collection Time: 12/18/21  8:44 AM   Specimen: Expectorated Sputum  Result Value Ref Range Status   Specimen Description EXPECTORATED SPUTUM  Final   Special Requests Immunocompromised  Final   Sputum evaluation   Final    THIS SPECIMEN IS ACCEPTABLE FOR SPUTUM CULTURE Performed at [facility], [place] [address] [place], [place] [address]    Report Status 12/18/2021 FINAL  Final  Culture, Respiratory w Gram Stain     Status: None (Preliminary result)   Collection Time: 12/18/21  8:44 AM  Result Value Ref Range Status   Specimen Description EXPECTORATED SPUTUM  Final   Special Requests Immunocompromised Reflexed from [id number]  Final   Gram Stain   Final    ABUNDANT WBC PRESENT,BOTH PMN AND MONONUCLEAR RARE SQUAMOUS EPITHELIAL CELLS PRESENT RARE GRAM POSITIVE COCCI RARE GRAM NEGATIVE RODS    Culture   Final    TOO YOUNG TO READ Performed at [facility], [place] [address], [place],  [address]    Report Status PENDING  Incomplete    Lab Basic Metabolic Panel: Recent Labs  Lab 12/16/21 0346 12/16/21 0753 12/17/21 0358 12/17/21 0413 12/18/21 0422 12/18/21 0434 12/18/21 1621 12/18/21 1638 12/19/21 0000  12/19/21 0413 12/19/21 0416 12/19/21 1108 12/19/21 1625  NA 152*   < > 146*   < > 147*   < > 146*   < > 145 145 145   144 149*   147* 145  K 4.3   < > 4.7   < > 4.7   < > 6.3*   < > 5.7* 5.6* 5.7* 6.2* 6.6*  CL 114*   < > 117*   < > 112*  --  108  --  109  --  108 114* 113*  CO2 20*   < > 18*   < > 21*  --  24  --  22  --  21* 18* 14*  GLUCOSE 145*   < > 171*   < > 177*  --  159*  --  163*  --  171* 138* 86  BUN 209*   < > 176*   < > 149*  --  127*  --  122*  --  116* 101* 96*  CREATININE 6.88*   < > 5.49*   < > 4.62*  --  4.06*  --  3.68*  --  3.60* 3.55* 3.67*  CALCIUM 7.5*   < > 7.4*   < > 7.6*  --  7.6*  --  7.4*  --  7.4* 7.5* 7.8*  MG 3.4*  --  3.2*  --  2.9*  --   --   --   --   --  2.7*  --   --   PHOS 6.4*   < > 4.4   < > 5.0*  --  8.3*  --  6.4*  --  6.7*  --  11.1*   < > = values in this interval not displayed.   Liver Function Tests: Recent Labs  Lab 11/25/2021 1616 12/16/21 0346 12/16/21 1553 12/17/21 0358 12/17/21 1550 12/18/21 1621 12/19/21 0000 12/19/21 0416 12/19/21 1108 12/19/21 1625  AST 46* 93*  --  110*  --   --   --   --  1,662*  --   ALT 22 57*  --  71*  --   --   --   --  957*  --   ALKPHOS 53 38  --  44  --   --   --   --  83  --   BILITOT 1.5* 1.2  --  1.8*  --   --   --   --  2.9*  --   PROT 6.9 5.1*  --  5.4*  --   --   --   --  5.6*  --   ALBUMIN 2.3* 1.7*   < > 1.6*   1.5*   < > 1.5* <1.5* <1.5* <1.5* 1.5*   < > = values in this interval not displayed.   No results for input(s): LIPASE, AMYLASE in the last 168 hours. Recent Labs  Lab 12/16/21 0346 12/17/21 0358  AMMONIA 43* 46*   CBC: Recent Labs  Lab 11/24/2021 1616 11/22/2021 1627 12/16/21 0346 12/16/21 0753 12/17/21 0358 12/17/21 0413 12/18/21 0422 12/18/21 0434 12/18/21 1321 12/18/21 1421 12/18/21 1638 12/19/21 0413 12/19/21 0416  WBC 12.7*  --  19.7*  --  27.6*  --  39.5*  --   --   --   --   --  55.0*  HGB 16.5   < > 13.5   < > 11.9*   < > 10.8*   < > 11.9* 10.9* 11.2* 10.9*  9.2*  HCT 50.3   < > 39.7   < > 36.0*   < > 33.6*   < > 35.0* 32.0* 33.0* 32.0* 30.2*  MCV 83.4  --  80.0  --  81.1  --  84.4  --   --   --   --   --  87.0  PLT 238  --  175  --  188  --  144*  --   --   --   --   --  147*   < > = values in this interval not displayed.   Cardiac Enzymes: Recent Labs  Lab 11/18/2021 1616 12/16/21 0346 12/17/21 0358  CKTOTAL 1,079* 992* 902*  CKMB 21.1*  --   --    Sepsis Labs: Recent Labs  Lab 12/06/2021 1616 12/09/2021 1926 12/16/21 0346 12/17/21 0358 12/18/21 0422 12/19/21 0416  WBC 12.7*  --  19.7* 27.6* 39.5* 55.0*  LATICACIDVEN 6.6* 3.5*  --   --   --   --     Procedures/Operations  As per EMR   Bonna Gains Ronnell Makarewicz Jan 18, 2022, 7:45 AM

## 2022-01-13 DEATH — deceased

## 2023-06-25 IMAGING — DX DG CHEST 1V PORT
1 series · 1 of 1 positions shown · non-contrast
Comparison: Portable chest 12/17/2021 and earlier.

CLINICAL DATA: 65-year-old male intubated. Found down. Right basal
ganglia hemorrhage.

EXAM:
PORTABLE CHEST 1 VIEW

[chest]
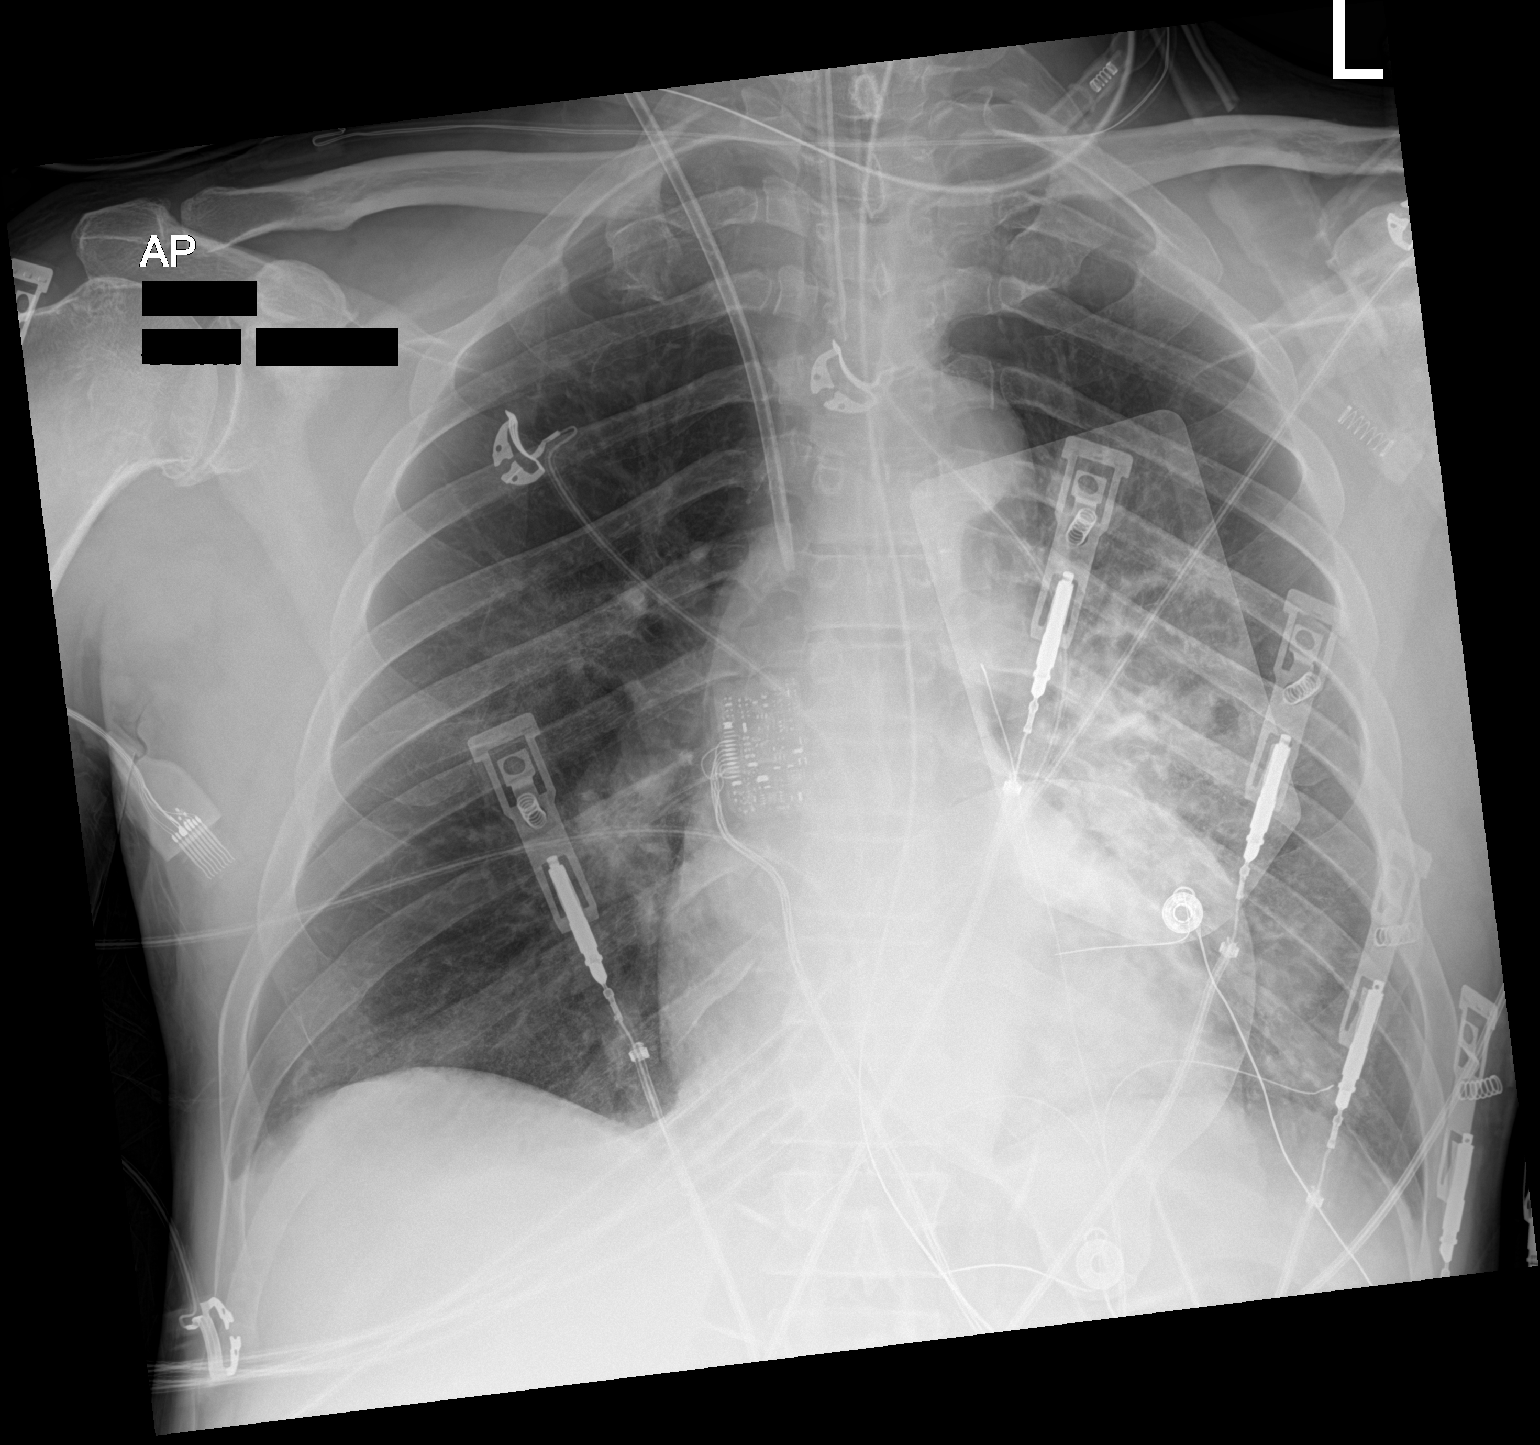

[1 of 1 positions shown; findings below may reference images not displayed]

FINDINGS: Portable AP semi upright view at 6661 hours. Stable lines and tubes.
Patchy and confluent left perihilar and lung base opacity not
significantly changed. Stable lung volumes. Right lung now appears
clear when allowing for portable technique. Mediastinal contours
remain within normal limits. No pneumothorax or pulmonary edema. No
acute osseous abnormality identified.
IMPRESSION: 1. Stable lines and tubes.
2. Clear right lung. Patchy and confluent left perihilar and lung
base opacity not significantly changed.

## 2023-06-26 IMAGING — DX DG CHEST 1V PORT
1 series · 1 of 1 positions shown · non-contrast
Comparison: 12/18/2021

CLINICAL DATA: Pneumonia.

EXAM:
PORTABLE CHEST 1 VIEW

[chest]
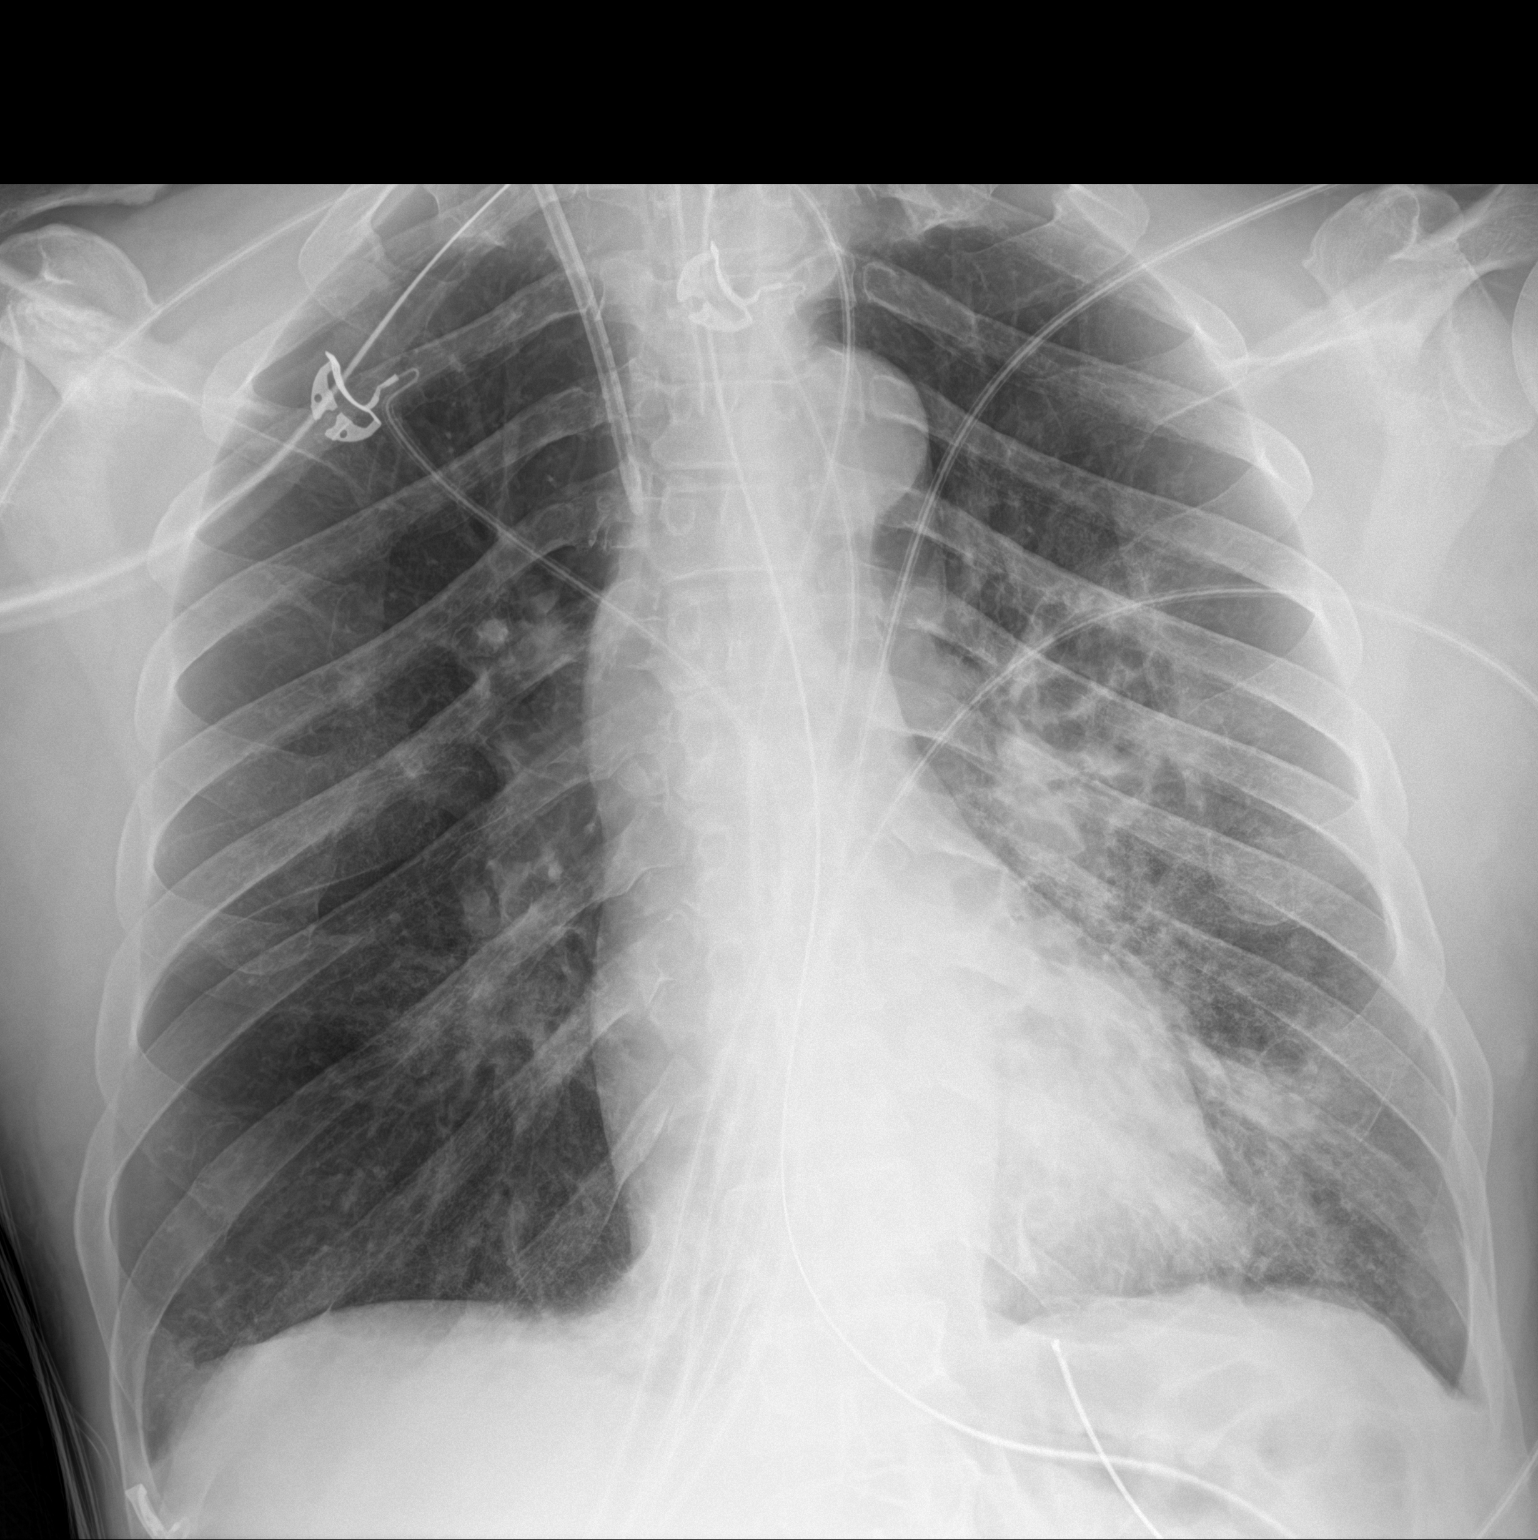

[1 of 1 positions shown; findings below may reference images not displayed]

FINDINGS: 9682 hours. Endotracheal tube tip is 4.9 cm above the base of the
carina. NG tube is looped in the stomach with the tip in the fundal
region. Right IJ central line tip overlies the region of the
innominate vein confluence. Lungs appear hyperexpanded. Right lung
clear. Patchy airspace disease left mid and lower lung is similar to
prior although aeration appears improved at the left base. Telemetry
leads overlie the chest.
IMPRESSION: 1. Interval improvement in aeration at the left base. Otherwise no
change in exam.
2. Support apparatus as described.

## 2023-09-16 DEATH — deceased
